# Patient Record
Sex: Male | Born: 1950
Health system: Southern US, Community
[De-identification: ages and names within clinical notes are randomized; demographics above are authoritative.]

## PROBLEM LIST (undated history)

## (undated) DIAGNOSIS — I639 Cerebral infarction, unspecified: Secondary | ICD-10-CM

## (undated) DIAGNOSIS — Z7189 Other specified counseling: Secondary | ICD-10-CM

## (undated) DIAGNOSIS — J449 Chronic obstructive pulmonary disease, unspecified: Secondary | ICD-10-CM

## (undated) DIAGNOSIS — R0600 Dyspnea, unspecified: Secondary | ICD-10-CM

## (undated) DIAGNOSIS — T451X5A Adverse effect of antineoplastic and immunosuppressive drugs, initial encounter: Secondary | ICD-10-CM

## (undated) DIAGNOSIS — C7800 Secondary malignant neoplasm of unspecified lung: Secondary | ICD-10-CM

## (undated) DIAGNOSIS — Z973 Presence of spectacles and contact lenses: Secondary | ICD-10-CM

## (undated) DIAGNOSIS — I6522 Occlusion and stenosis of left carotid artery: Secondary | ICD-10-CM

## (undated) DIAGNOSIS — Z8673 Personal history of transient ischemic attack (TIA), and cerebral infarction without residual deficits: Secondary | ICD-10-CM

## (undated) DIAGNOSIS — I6521 Occlusion and stenosis of right carotid artery: Secondary | ICD-10-CM

## (undated) DIAGNOSIS — K219 Gastro-esophageal reflux disease without esophagitis: Secondary | ICD-10-CM

## (undated) DIAGNOSIS — I714 Abdominal aortic aneurysm, without rupture, unspecified: Secondary | ICD-10-CM

## (undated) DIAGNOSIS — I723 Aneurysm of iliac artery: Secondary | ICD-10-CM

## (undated) DIAGNOSIS — I10 Essential (primary) hypertension: Secondary | ICD-10-CM

## (undated) DIAGNOSIS — G620 Drug-induced polyneuropathy: Secondary | ICD-10-CM

## (undated) DIAGNOSIS — Z8614 Personal history of Methicillin resistant Staphylococcus aureus infection: Secondary | ICD-10-CM

## (undated) DIAGNOSIS — K5730 Diverticulosis of large intestine without perforation or abscess without bleeding: Secondary | ICD-10-CM

## (undated) DIAGNOSIS — K529 Noninfective gastroenteritis and colitis, unspecified: Secondary | ICD-10-CM

## (undated) DIAGNOSIS — K4090 Unilateral inguinal hernia, without obstruction or gangrene, not specified as recurrent: Secondary | ICD-10-CM

## (undated) DIAGNOSIS — J410 Simple chronic bronchitis: Secondary | ICD-10-CM

## (undated) DIAGNOSIS — F329 Major depressive disorder, single episode, unspecified: Secondary | ICD-10-CM

## (undated) DIAGNOSIS — Z5111 Encounter for antineoplastic chemotherapy: Secondary | ICD-10-CM

## (undated) DIAGNOSIS — R0609 Other forms of dyspnea: Secondary | ICD-10-CM

## (undated) DIAGNOSIS — Z9221 Personal history of antineoplastic chemotherapy: Secondary | ICD-10-CM

## (undated) HISTORY — DX: Other specified counseling: Z71.89

## (undated) HISTORY — DX: Secondary malignant neoplasm of unspecified lung: C78.00

## (undated) HISTORY — PX: COLONOSCOPY: SHX174

## (undated) HISTORY — DX: Essential (primary) hypertension: I10

## (undated) HISTORY — DX: Cerebral infarction, unspecified: I63.9

## (undated) HISTORY — DX: Encounter for antineoplastic chemotherapy: Z51.11

## (undated) HISTORY — DX: Major depressive disorder, single episode, unspecified: F32.9

## (undated) HISTORY — PX: BACK SURGERY: SHX140

---

## 2016-12-29 ENCOUNTER — Other Ambulatory Visit (HOSPITAL_COMMUNITY): Payer: Self-pay | Admitting: Internal Medicine

## 2016-12-29 DIAGNOSIS — R9389 Abnormal findings on diagnostic imaging of other specified body structures: Secondary | ICD-10-CM

## 2016-12-31 ENCOUNTER — Encounter (HOSPITAL_COMMUNITY): Admission: RE | Admit: 2016-12-31 | Payer: BLUE CROSS/BLUE SHIELD | Source: Ambulatory Visit

## 2017-01-03 ENCOUNTER — Encounter: Payer: Self-pay | Admitting: Pulmonary Disease

## 2017-01-03 ENCOUNTER — Ambulatory Visit (INDEPENDENT_AMBULATORY_CARE_PROVIDER_SITE_OTHER): Payer: BLUE CROSS/BLUE SHIELD | Admitting: Pulmonary Disease

## 2017-01-03 ENCOUNTER — Encounter (HOSPITAL_COMMUNITY)
Admission: RE | Admit: 2017-01-03 | Discharge: 2017-01-03 | Disposition: A | Discharge status: Home / Self Care | Payer: BLUE CROSS/BLUE SHIELD | Source: Ambulatory Visit | Attending: Internal Medicine | Admitting: Internal Medicine

## 2017-01-03 VITALS — BP 134/68 | HR 83 | Ht 69.0 in | Wt 217.0 lb

## 2017-01-03 DIAGNOSIS — I2510 Atherosclerotic heart disease of native coronary artery without angina pectoris: Secondary | ICD-10-CM | POA: Insufficient documentation

## 2017-01-03 DIAGNOSIS — K4090 Unilateral inguinal hernia, without obstruction or gangrene, not specified as recurrent: Secondary | ICD-10-CM | POA: Diagnosis not present

## 2017-01-03 DIAGNOSIS — I714 Abdominal aortic aneurysm, without rupture: Secondary | ICD-10-CM | POA: Diagnosis not present

## 2017-01-03 DIAGNOSIS — K5730 Diverticulosis of large intestine without perforation or abscess without bleeding: Secondary | ICD-10-CM | POA: Diagnosis not present

## 2017-01-03 DIAGNOSIS — J90 Pleural effusion, not elsewhere classified: Secondary | ICD-10-CM | POA: Insufficient documentation

## 2017-01-03 DIAGNOSIS — R918 Other nonspecific abnormal finding of lung field: Secondary | ICD-10-CM | POA: Diagnosis not present

## 2017-01-03 DIAGNOSIS — I700 Atherosclerosis of aorta: Secondary | ICD-10-CM | POA: Insufficient documentation

## 2017-01-03 DIAGNOSIS — I712 Thoracic aortic aneurysm, without rupture: Secondary | ICD-10-CM | POA: Diagnosis not present

## 2017-01-03 DIAGNOSIS — R911 Solitary pulmonary nodule: Secondary | ICD-10-CM | POA: Diagnosis present

## 2017-01-03 DIAGNOSIS — R9389 Abnormal findings on diagnostic imaging of other specified body structures: Secondary | ICD-10-CM

## 2017-01-03 LAB — GLUCOSE, CAPILLARY: GLUCOSE-CAPILLARY: 110 mg/dL — AB (ref 65–99)

## 2017-01-03 MED ORDER — FLUDEOXYGLUCOSE F - 18 (FDG) INJECTION: 11.1000 | Freq: Once | INTRAVENOUS | Status: DC | PRN

## 2017-01-03 MED ORDER — NICOTINE 21 MG/24HR TD PT24: 21.0000 mg | MEDICATED_PATCH | Freq: Every day | TRANSDERMAL | 1 refills | Status: DC

## 2017-01-03 NOTE — Patient Instructions (Signed)
We will refer you to CT-guided biopsy of the lung We discussed the case in multidisciplinary tumor board  We will prescribe nicotine patch 21 mg/day  Return in 1 month

## 2017-01-03 NOTE — Progress Notes (Addendum)
[name] [name]    [id number]    Oct 19, 1950  Primary Care Physician:No PCP Per Patient  Referring Physician: [name], MD [address] [place], [place] [address]  Chief complaint:  Consult for evaluation of lung nodules.  HPI: [name] is a 66 year old with medical history of hypertension. He was treated earlier this year for pneumonia after an abnormal CXR. He had persistent CXR abnormalities after antibiotics which prompted a CT of the chest earlier this month which showed multiple lung masses suspicious for carcinoma. 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 He denies any cough, sputum production, dyspnea, wheezing. He denies any malaise, fatigue, loss of weight, loss of appetite, hemoptysis.  No outpatient encounter prescriptions on file as of 01/03/2017.   Facility-Administered Encounter Medications as of 01/03/2017  Medication  . fludeoxyglucose F - 18 (FDG) injection 02.7 millicurie    Allergies as of 01/03/2017  . (No Known Allergies)    Past Medical History:  Diagnosis Date  . Hypertension     Past Surgical History:  Procedure Laterality Date  . BACK SURGERY     cyst removal    Family History  Problem Relation Age of Onset  . Cancer Father     unsure of what type    Social History   Social History  . Marital status: Single    Spouse name: N/A  . Number of children: N/A  . Years of education: N/A   Occupational History  . Not on file.   Social History Main Topics  . Smoking status: Current Every Day Smoker    Packs/day: 1.00    Years: 45.00    Types: Cigarettes  . Smokeless tobacco: Never Used  . Alcohol use Not on file  . Drug use: Unknown  . Sexual activity: Not on file   Other Topics Concern  . Not on file   Social History Narrative  . No narrative on file    Review of systems: Review of Systems  Constitutional:  Negative for fever and chills.  HENT: Negative.   Eyes: Negative for blurred vision.  Respiratory: as per HPI  Cardiovascular: Negative for chest pain and palpitations.  Gastrointestinal: Negative for vomiting, diarrhea, blood per rectum. Genitourinary: Negative for dysuria, urgency, frequency and hematuria.  Musculoskeletal: Negative for myalgias, back pain and joint pain.  Skin: Negative for itching and rash.  Neurological: Negative for dizziness, tremors, focal weakness, seizures and loss of consciousness.  Endo/Heme/Allergies: Negative for environmental allergies.  Psychiatric/Behavioral: Negative for depression, suicidal ideas and hallucinations.  All other systems reviewed and are negative.  Physical Exam: Blood pressure 134/68, pulse 83, height '5\' 9"'$  (1.753 m), weight 217 lb (98.4 kg), SpO2 95 %. Gen:      No acute distress HEENT:  EOMI, sclera anicteric Neck:     No masses; no thyromegaly Lungs:    Clear to auscultation bilaterally; normal respiratory effort CV:         Regular rate and rhythm; no murmurs Abd:      + bowel sounds; soft, non-tender; no palpable masses, no distension Ext:    No edema; adequate peripheral perfusion Skin:      Warm and dry; no rash Neuro: alert and oriented x 3 Psych: normal mood and affect  Data Reviewed: CT chest 12/22/16 3 cm right upper lobes spiculated mass, 1.3  cm right upper lobe spiculated nodule, 2 cm left upper lobe spiculated nodule and 6 mm left upper lobe spiculated nodule.  PET scan 01/18/46 Hypermetabolic 3.2 cm right upper lobe nodule. Bilateral upper lobe lung nodules with PET uptake No lymphadenopathy or distant metastatic disease I have reviewed all images personally.  Assessment:  Multiple lung masses and nodules with positive PET uptake highly suspicious for cancer He'll need a biopsy for further diagnosis.  I spent some time with Mr. Creque and his wife discussing options, risk benefit for biopsy with either a navigational  bronchoscope or CT guided biopsy. The location of the nodules make it discuss to access through the regular bronchoscope.   I will make a referral to radiology for further eval and will discuss case at multidisciplinary tumor board to decide on the best approach for biopsy.   Plan/Recommendations: - Refer for CT guided biopsy vs navigational bronchoscope - Tumor board discussion.  At least half of the 40 min visit was spent discussing the images, counseling and discussion of the risk benefits of various procedures for biopsy with the patient and family.   Marshell Garfinkel MD [facility] Pager [phone] 01/03/2017, 3:51 PM  CC: [name], MD

## 2017-01-04 ENCOUNTER — Ambulatory Visit: Payer: Self-pay | Admitting: Pulmonary Disease

## 2017-01-06 ENCOUNTER — Telehealth: Payer: Self-pay | Admitting: Pulmonary Disease

## 2017-01-06 DIAGNOSIS — R918 Other nonspecific abnormal finding of lung field: Secondary | ICD-10-CM

## 2017-01-06 NOTE — Telephone Encounter (Signed)
Order for CT Chest w/o contrast with Super D images ordered - note added that pt will contact us with available dates

## 2017-01-06 NOTE — Telephone Encounter (Signed)
Spoke with pt, states he is returning PM's call from this morning.  PM please call pt back when available.  Thanks!

## 2017-01-06 NOTE — Telephone Encounter (Signed)
Pt wife returning call a/b CT and can be reached @ [phone].Hillery Hunter

## 2017-01-06 NOTE — Telephone Encounter (Signed)
Once patient calls with availability for the CT this phone note will need to be routed to [place] so that the referral to pulmonary can be made for the [place] can schedule (this is per office protocol).

## 2017-01-06 NOTE — Telephone Encounter (Signed)
I have called and spoke with the patient. His case was discussed at the tumor board and with Dr. Lamonte Sakai. There is concern for synchronous primaries. We will try for a navigational bronchoscopy for biopsy of both bilateral lung masses by Dr. Lamonte Sakai. Pt is agreeable to proceed.  He will need a repeat CT scan with super D images for the procedure. 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 He will call us to let us know when he is available.  Plan for CT of chest without contrast with super D images and then schedule for Nav bronchoscopy by Dr. Lamonte Sakai  PM

## 2017-01-06 NOTE — Telephone Encounter (Signed)
Spoke with the pt  He states that he still does not know what availability he has  He will call us tomorrow with this information

## 2017-01-07 NOTE — Telephone Encounter (Signed)
Chest ct '@lhc'$  01/11/17'@2'$ :15 pt aware Joellen Jersey

## 2017-01-07 NOTE — Telephone Encounter (Signed)
Patient returned call, CB is [phone].

## 2017-01-07 NOTE — Telephone Encounter (Signed)
[name] [name] ordered the scans please see previous message from pt about availible date

## 2017-01-07 NOTE — Telephone Encounter (Signed)
We need an order for the ct

## 2017-01-07 NOTE — Telephone Encounter (Signed)
Pt is available 3/27 after 11:00 for a CT.  PCC's please advise.  Thanks!   (routing to [place] as well per previous documentation request)

## 2017-01-10 NOTE — Telephone Encounter (Signed)
Dr byrum I was told to route this to you pt have super d chest ct 3/27 and I have ordered a disk let me know when to schedule the enb thanks libby

## 2017-01-10 NOTE — Telephone Encounter (Signed)
Thanks [place], Dr Vaughan Browner said that he believes the pt could do it on Wednesday 01/19/17

## 2017-01-11 ENCOUNTER — Ambulatory Visit (INDEPENDENT_AMBULATORY_CARE_PROVIDER_SITE_OTHER)
Admission: RE | Admit: 2017-01-11 | Discharge: 2017-01-11 | Disposition: A | Discharge status: Home / Self Care | Payer: BLUE CROSS/BLUE SHIELD | Source: Ambulatory Visit | Attending: Pulmonary Disease | Admitting: Pulmonary Disease

## 2017-01-11 ENCOUNTER — Encounter (HOSPITAL_COMMUNITY)
Admission: RE | Admit: 2017-01-11 | Discharge: 2017-01-11 | Disposition: A | Discharge status: Home / Self Care | Payer: BLUE CROSS/BLUE SHIELD | Source: Ambulatory Visit | Attending: Emergency Medicine | Admitting: Emergency Medicine

## 2017-01-11 ENCOUNTER — Encounter (HOSPITAL_COMMUNITY): Payer: Self-pay | Admitting: *Deleted

## 2017-01-11 DIAGNOSIS — R918 Other nonspecific abnormal finding of lung field: Secondary | ICD-10-CM

## 2017-01-11 DIAGNOSIS — Z01818 Encounter for other preprocedural examination: Secondary | ICD-10-CM | POA: Insufficient documentation

## 2017-01-11 LAB — BASIC METABOLIC PANEL
ANION GAP: 11 (ref 5–15)
BUN: 20 mg/dL (ref 6–20)
CALCIUM: 9.1 mg/dL (ref 8.9–10.3)
CO2: 25 mmol/L (ref 22–32)
CREATININE: 1.07 mg/dL (ref 0.61–1.24)
Chloride: 100 mmol/L — ABNORMAL LOW (ref 101–111)
GFR calc Af Amer: >60 mL/min (ref >60)
GLUCOSE: 88 mg/dL (ref 65–99)
Potassium: 3.9 mmol/L (ref 3.5–5.1)
Sodium: 136 mmol/L (ref 135–145)

## 2017-01-11 LAB — CBC
HCT: 46.4 % (ref 39.0–52.0)
Hemoglobin: 15.7 g/dL (ref 13.0–17.0)
MCH: 29.1 pg (ref 26.0–34.0)
MCHC: 33.8 g/dL (ref 30.0–36.0)
MCV: 86.1 fL (ref 78.0–100.0)
Platelets: 244 10*3/uL (ref 150–400)
RBC: 5.39 MIL/uL (ref 4.22–5.81)
RDW: 13.8 % (ref 11.5–15.5)
WBC: 10.4 10*3/uL (ref 4.0–10.5)

## 2017-01-11 NOTE — Progress Notes (Signed)
PCP is Dr. Duane Boston  Denies ever seeing a cardiologist. Denies ever having a card cath, stress test, or echo. Denies any chest pain.

## 2017-01-11 NOTE — Telephone Encounter (Signed)
enb schedule'@cone'$  4/4/1/'@8'$ :30am [name] [name]

## 2017-01-11 NOTE — Pre-Procedure Instructions (Signed)
[name] [name]  01/11/2017      [facility] [address], [place] [address] [place] [address] Phone: [phone] Fax: [phone]  [facility] [place], [place] - [address] [place] [address] Phone: [phone] Fax: [phone]    Your procedure is scheduled on .April 4  Report to [facility] at 630 A.M.  Call this number if you have problems the morning of surgery:  [phone]   Remember:  Do not eat food or drink liquids after midnight.  Take these medicines the morning of surgery with A SIP OF WATER -NA  Stop taking aspirin, BC's, Goody's, Ibuprofen, Advil, Motrin, Aleve, Vitamins, Herbal medications, Fish Oil   Do not wear jewelry, make-up or nail polish.  Do not wear lotions, powders, or perfumes, or deoderant.  Do not shave 48 hours prior to surgery.  Men may shave face and neck.  Do not bring valuables to the hospital.  [facility] is not responsible for any belongings or valuables.  Contacts, dentures or bridgework may not be worn into surgery.  Leave your suitcase in the car.  After surgery it may be brought to your room.  For patients admitted to the hospital, discharge time will be determined by your treatment team.  Patients discharged the day of surgery will not be allowed to drive home.    Special instructions:  [facility] - Preparing for Surgery  Before surgery, you can play an important role.  Because skin is not sterile, your skin needs to be as free of germs as possible.  You can reduce the number of germs on you skin by washing with CHG (chlorahexidine gluconate) soap before surgery.  CHG is an antiseptic cleaner which kills germs and bonds with the skin to continue killing germs even after washing.  Please DO NOT use if you have an allergy to CHG or antibacterial soaps.  If your skin becomes reddened/irritated stop using the CHG and inform your nurse when you arrive at Short  Stay.  Do not shave (including legs and underarms) for at least 48 hours prior to the first CHG shower.  You may shave your face.  Please follow these instructions carefully:   1.  Shower with CHG Soap the night before surgery and the  morning of Surgery.  2.  If you choose to wash your hair, wash your hair first as usual with your   normal shampoo.  3.  After you shampoo, rinse your hair and body thoroughly to remove the   Shampoo.  4.  Use CHG as you would any other liquid soap.  You can apply chg directly to the skin and wash gently with scrungie or a clean washcloth.  5.  Apply the CHG Soap to your body ONLY FROM THE NECK DOWN.  Do not use on open wounds or open sores.  Avoid contact with your eyes,       ears, mouth and genitals (private parts).  Wash genitals (private parts)  with your normal soap.  6.  Wash thoroughly, paying special attention to the area where your surgery  will be performed.  7.  Thoroughly rinse your body with warm water from the neck down.  8.  DO NOT shower/wash with your normal soap after using and rinsing off  the CHG Soap.  9.  Pat yourself dry with a clean towel.  10.  Wear clean pajamas.            11.  Place clean sheets on your bed the night of your first shower and do not sleep with pets.  Day of Surgery  Do not apply any lotions/deoderants the morning of surgery.  Please wear clean clothes to the hospital/surgery center.     Please read over the following fact sheets that you were given. Pain Booklet, Coughing and Deep Breathing and Surgical Site Infection Prevention

## 2017-01-12 NOTE — Telephone Encounter (Signed)
Spoke to pt is is aware of this appt and admitting has called him and told him to arrive'@6'$ :[place]

## 2017-01-18 NOTE — Anesthesia Preprocedure Evaluation (Addendum)
Anesthesia Evaluation  Patient identified by MRN, date of birth, ID band Patient awake    Reviewed: Allergy & Precautions, NPO status , Patient's Chart, lab work & pertinent test results  Airway Mallampati: I  TM Distance: >3 FB Neck ROM: Full    Dental  (+) Teeth Intact, Poor Dentition   Pulmonary shortness of breath and with exertion, pneumonia, resolved, Current Smoker,  Per report: "Multiple lung masses and nodules with positive PET uptake highly suspicious for cancer"   breath sounds clear to auscultation       Cardiovascular hypertension, Pt. on medications negative cardio ROS   Rhythm:Regular Rate:Normal     Neuro/Psych negative neurological ROS  negative psych ROS   GI/Hepatic negative GI ROS, Neg liver ROS,   Endo/Other  negative endocrine ROS  Renal/GU negative Renal ROS     Musculoskeletal negative musculoskeletal ROS (+)   Abdominal (+) + obese,   Peds  Hematology negative hematology ROS (+)   Anesthesia Other Findings   Reproductive/Obstetrics negative OB ROS                           01/11/17:EKG Normal sinus rhythm Normal ECG No previous tracing  Lab Results  Component Value Date   WBC 10.4 01/11/2017   HGB 15.7 01/11/2017   HCT 46.4 01/11/2017   MCV 86.1 01/11/2017   PLT 244 01/11/2017     Chemistry      Component Value Date/Time   NA 136 01/11/2017 1516   K 3.9 01/11/2017 1516   CL 100 (L) 01/11/2017 1516   CO2 25 01/11/2017 1516   BUN 20 01/11/2017 1516   CREATININE 1.07 01/11/2017 1516      Component Value Date/Time   CALCIUM 9.1 01/11/2017 1516     BP Readings from Last 3 Encounters:  01/11/17 139/79  01/03/17 134/68     Anesthesia Physical Anesthesia Plan  ASA: III  Anesthesia Plan: General   Post-op Pain Management:    Induction: Intravenous  Airway Management Planned: Oral ETT  Additional Equipment:   Intra-op Plan:    Post-operative Plan: Extubation in OR  Informed Consent:   Plan Discussed with:   Anesthesia Plan Comments:        Anesthesia Quick Evaluation

## 2017-01-19 ENCOUNTER — Ambulatory Visit (HOSPITAL_COMMUNITY): Payer: BLUE CROSS/BLUE SHIELD

## 2017-01-19 ENCOUNTER — Encounter (HOSPITAL_COMMUNITY)
Admission: RE | Disposition: A | Discharge status: Home / Self Care | Payer: Self-pay | Source: Ambulatory Visit | Attending: Emergency Medicine

## 2017-01-19 ENCOUNTER — Encounter (HOSPITAL_COMMUNITY): Payer: Self-pay | Admitting: *Deleted

## 2017-01-19 ENCOUNTER — Ambulatory Visit (HOSPITAL_COMMUNITY)
Admission: RE | Admit: 2017-01-19 | Discharge: 2017-01-19 | Disposition: A | Discharge status: Home / Self Care | Payer: BLUE CROSS/BLUE SHIELD | Source: Ambulatory Visit | Attending: Emergency Medicine | Admitting: Emergency Medicine

## 2017-01-19 ENCOUNTER — Ambulatory Visit (HOSPITAL_COMMUNITY): Payer: BLUE CROSS/BLUE SHIELD | Admitting: Anesthesiology

## 2017-01-19 DIAGNOSIS — F17210 Nicotine dependence, cigarettes, uncomplicated: Secondary | ICD-10-CM | POA: Diagnosis not present

## 2017-01-19 DIAGNOSIS — I10 Essential (primary) hypertension: Secondary | ICD-10-CM | POA: Insufficient documentation

## 2017-01-19 DIAGNOSIS — C3411 Malignant neoplasm of upper lobe, right bronchus or lung: Secondary | ICD-10-CM | POA: Diagnosis not present

## 2017-01-19 DIAGNOSIS — R918 Other nonspecific abnormal finding of lung field: Secondary | ICD-10-CM | POA: Diagnosis not present

## 2017-01-19 DIAGNOSIS — Z419 Encounter for procedure for purposes other than remedying health state, unspecified: Secondary | ICD-10-CM

## 2017-01-19 DIAGNOSIS — Z9889 Other specified postprocedural states: Secondary | ICD-10-CM

## 2017-01-19 HISTORY — PX: MINOR PLACEMENT OF FIDUCIAL: SHX6748

## 2017-01-19 HISTORY — PX: VIDEO BRONCHOSCOPY WITH ENDOBRONCHIAL NAVIGATION: SHX6175

## 2017-01-19 HISTORY — DX: Dyspnea, unspecified: R06.00

## 2017-01-19 LAB — COMPREHENSIVE METABOLIC PANEL
ALBUMIN: 3.7 g/dL (ref 3.5–5.0)
ALK PHOS: 49 U/L (ref 38–126)
ALT: 16 U/L — AB (ref 17–63)
AST: 18 U/L (ref 15–41)
Anion gap: 9 (ref 5–15)
BILIRUBIN TOTAL: 0.5 mg/dL (ref 0.3–1.2)
BUN: 18 mg/dL (ref 6–20)
CALCIUM: 9 mg/dL (ref 8.9–10.3)
CO2: 25 mmol/L (ref 22–32)
CREATININE: 1 mg/dL (ref 0.61–1.24)
Chloride: 104 mmol/L (ref 101–111)
GFR calc Af Amer: >60 mL/min (ref >60)
GFR calc non Af Amer: >60 mL/min (ref >60)
Glucose, Bld: 114 mg/dL — ABNORMAL HIGH (ref 65–99)
Potassium: 3.9 mmol/L (ref 3.5–5.1)
SODIUM: 138 mmol/L (ref 135–145)
TOTAL PROTEIN: 7 g/dL (ref 6.5–8.1)

## 2017-01-19 LAB — PROTIME-INR
INR: 0.96
Prothrombin Time: 12.8 seconds (ref 11.4–15.2)

## 2017-01-19 LAB — APTT: aPTT: 33 seconds (ref 24–36)

## 2017-01-19 SURGERY — VIDEO BRONCHOSCOPY WITH ENDOBRONCHIAL NAVIGATION
Anesthesia: General

## 2017-01-19 MED ORDER — PROPOFOL 10 MG/ML IV BOLUS
INTRAVENOUS | Status: AC
  Filled 2017-01-19: qty 40

## 2017-01-19 MED ORDER — 0.9 % SODIUM CHLORIDE (POUR BTL) OPTIME
TOPICAL | Status: DC | PRN
  Administered 2017-01-19: 1000 mL

## 2017-01-19 MED ORDER — PROMETHAZINE HCL 25 MG/ML IJ SOLN: 6.2500 mg | INTRAMUSCULAR | Status: DC | PRN

## 2017-01-19 MED ORDER — PHENYLEPHRINE 40 MCG/ML (10ML) SYRINGE FOR IV PUSH (FOR BLOOD PRESSURE SUPPORT)
PREFILLED_SYRINGE | INTRAVENOUS | Status: DC | PRN
  Administered 2017-01-19: 120 ug via INTRAVENOUS
  Administered 2017-01-19: 80 ug via INTRAVENOUS
  Administered 2017-01-19 (×3): 120 ug via INTRAVENOUS

## 2017-01-19 MED ORDER — LACTATED RINGERS IV SOLN
INTRAVENOUS | Status: DC | PRN
  Administered 2017-01-19 (×2): via INTRAVENOUS

## 2017-01-19 MED ORDER — ROCURONIUM BROMIDE 50 MG/5ML IV SOSY
PREFILLED_SYRINGE | INTRAVENOUS | Status: AC
  Filled 2017-01-19: qty 5

## 2017-01-19 MED ORDER — PROPOFOL 10 MG/ML IV BOLUS
INTRAVENOUS | Status: DC | PRN
  Administered 2017-01-19: 200 mg via INTRAVENOUS

## 2017-01-19 MED ORDER — ONDANSETRON HCL 4 MG/2ML IJ SOLN
INTRAMUSCULAR | Status: AC
  Filled 2017-01-19: qty 2

## 2017-01-19 MED ORDER — MIDAZOLAM HCL 2 MG/2ML IJ SOLN
INTRAMUSCULAR | Status: DC | PRN
  Administered 2017-01-19: 2 mg via INTRAVENOUS

## 2017-01-19 MED ORDER — PHENYLEPHRINE 40 MCG/ML (10ML) SYRINGE FOR IV PUSH (FOR BLOOD PRESSURE SUPPORT)
PREFILLED_SYRINGE | INTRAVENOUS | Status: AC
  Filled 2017-01-19: qty 20

## 2017-01-19 MED ORDER — EPHEDRINE SULFATE-NACL 50-0.9 MG/10ML-% IV SOSY
PREFILLED_SYRINGE | INTRAVENOUS | Status: DC | PRN
  Administered 2017-01-19: 5 mg via INTRAVENOUS

## 2017-01-19 MED ORDER — SCOPOLAMINE 1 MG/3DAYS TD PT72
MEDICATED_PATCH | TRANSDERMAL | Status: AC
  Filled 2017-01-19: qty 2

## 2017-01-19 MED ORDER — ROCURONIUM BROMIDE 10 MG/ML (PF) SYRINGE
PREFILLED_SYRINGE | INTRAVENOUS | Status: DC | PRN
  Administered 2017-01-19: 20 mg via INTRAVENOUS
  Administered 2017-01-19: 50 mg via INTRAVENOUS

## 2017-01-19 MED ORDER — SUGAMMADEX SODIUM 200 MG/2ML IV SOLN
INTRAVENOUS | Status: AC
  Filled 2017-01-19: qty 2

## 2017-01-19 MED ORDER — ONDANSETRON HCL 4 MG/2ML IJ SOLN
INTRAMUSCULAR | Status: DC | PRN
  Administered 2017-01-19: 4 mg via INTRAVENOUS

## 2017-01-19 MED ORDER — FENTANYL CITRATE (PF) 100 MCG/2ML IJ SOLN
INTRAMUSCULAR | Status: DC | PRN
  Administered 2017-01-19: 125 ug via INTRAVENOUS
  Administered 2017-01-19: 25 ug via INTRAVENOUS

## 2017-01-19 MED ORDER — DEXTROSE 5 % IV SOLN
INTRAVENOUS | Status: DC | PRN
  Administered 2017-01-19: 25 ug/min via INTRAVENOUS

## 2017-01-19 MED ORDER — SUGAMMADEX SODIUM 200 MG/2ML IV SOLN
INTRAVENOUS | Status: DC | PRN
  Administered 2017-01-19: 200 mg via INTRAVENOUS

## 2017-01-19 MED ORDER — PHENYLEPHRINE HCL 10 MG/ML IJ SOLN
INTRAMUSCULAR | Status: AC
  Filled 2017-01-19: qty 1

## 2017-01-19 MED ORDER — EPINEPHRINE PF 1 MG/ML IJ SOLN
INTRAMUSCULAR | Status: AC
  Filled 2017-01-19: qty 1

## 2017-01-19 MED ORDER — DEXAMETHASONE SODIUM PHOSPHATE 10 MG/ML IJ SOLN
INTRAMUSCULAR | Status: DC | PRN
  Administered 2017-01-19: 10 mg via INTRAVENOUS

## 2017-01-19 MED ORDER — LIDOCAINE 2% (20 MG/ML) 5 ML SYRINGE
INTRAMUSCULAR | Status: AC
  Filled 2017-01-19: qty 5

## 2017-01-19 MED ORDER — LIDOCAINE 2% (20 MG/ML) 5 ML SYRINGE
INTRAMUSCULAR | Status: DC | PRN
  Administered 2017-01-19: 100 mg via INTRAVENOUS

## 2017-01-19 MED ORDER — ROCURONIUM BROMIDE 50 MG/5ML IV SOSY
PREFILLED_SYRINGE | INTRAVENOUS | Status: AC
Start: 2017-01-19 — End: 2017-01-19
  Filled 2017-01-19: qty 5

## 2017-01-19 MED ORDER — EPHEDRINE 5 MG/ML INJ
INTRAVENOUS | Status: AC
Start: 2017-01-19 — End: 2017-01-19
  Filled 2017-01-19: qty 10

## 2017-01-19 MED ORDER — HYDROMORPHONE HCL 1 MG/ML IJ SOLN: 0.2500 mg | INTRAMUSCULAR | Status: DC | PRN

## 2017-01-19 MED ORDER — MIDAZOLAM HCL 2 MG/2ML IJ SOLN
INTRAMUSCULAR | Status: AC
  Filled 2017-01-19: qty 2

## 2017-01-19 MED ORDER — DEXAMETHASONE SODIUM PHOSPHATE 10 MG/ML IJ SOLN
INTRAMUSCULAR | Status: AC
  Filled 2017-01-19: qty 1

## 2017-01-19 MED ORDER — FENTANYL CITRATE (PF) 250 MCG/5ML IJ SOLN
INTRAMUSCULAR | Status: AC
  Filled 2017-01-19: qty 5

## 2017-01-19 SURGICAL SUPPLY — 39 items
ADAPTER BRONCH F/PENTAX (ADAPTER) ×3 IMPLANT
BRUSH CYTOL CELLEBRITY 1.5X140 (MISCELLANEOUS) ×3 IMPLANT
BRUSH SUPERTRAX BIOPSY (INSTRUMENTS) IMPLANT
BRUSH SUPERTRAX NDL-TIP CYTO (INSTRUMENTS) ×9 IMPLANT
CANISTER SUCT 3000ML PPV (MISCELLANEOUS) ×3 IMPLANT
CHANNEL WORK EXTEND EDGE 180 (KITS) IMPLANT
CHANNEL WORK EXTEND EDGE 45 (KITS) IMPLANT
CHANNEL WORK EXTEND EDGE 90 (KITS) IMPLANT
CONT SPEC 4OZ CLIKSEAL STRL BL (MISCELLANEOUS) ×3 IMPLANT
COVER BACK TABLE 60X90IN (DRAPES) ×3 IMPLANT
FILTER STRAW FLUID ASPIR (MISCELLANEOUS) IMPLANT
FORCEPS BIOP SUPERTRX PREMAR (INSTRUMENTS) ×3 IMPLANT
GAUZE SPONGE 4X4 12PLY STRL (GAUZE/BANDAGES/DRESSINGS) ×3 IMPLANT
GLOVE BIO SURGEON STRL SZ7.5 (GLOVE) ×6 IMPLANT
GOWN STRL REUS W/ TWL LRG LVL3 (GOWN DISPOSABLE) ×2 IMPLANT
GOWN STRL REUS W/TWL LRG LVL3 (GOWN DISPOSABLE) ×4
KIT CLEAN ENDO COMPLIANCE (KITS) ×3 IMPLANT
KIT LOCATABLE GUIDE (CANNULA) IMPLANT
KIT MARKER FIDUCIAL DELIVERY (KITS) ×3 IMPLANT
KIT PROCEDURE EDGE 180 (KITS) ×3 IMPLANT
KIT PROCEDURE EDGE 45 (KITS) IMPLANT
KIT PROCEDURE EDGE 90 (KITS) IMPLANT
KIT ROOM TURNOVER OR (KITS) ×3 IMPLANT
MARKER FIDUCIAL SL NIT COIL (Implant Marker) ×18 IMPLANT
MARKER SKIN DUAL TIP RULER LAB (MISCELLANEOUS) ×3 IMPLANT
NEEDLE SUPERTRX PREMARK BIOPSY (NEEDLE) ×6 IMPLANT
NS IRRIG 1000ML POUR BTL (IV SOLUTION) ×3 IMPLANT
OIL SILICONE PENTAX (PARTS (SERVICE/REPAIRS)) ×3 IMPLANT
PAD ARMBOARD 7.5X6 YLW CONV (MISCELLANEOUS) ×6 IMPLANT
PATCHES PATIENT (LABEL) ×3 IMPLANT
SYR 20CC LL (SYRINGE) ×3 IMPLANT
SYR 20ML ECCENTRIC (SYRINGE) ×3 IMPLANT
SYR 50ML SLIP (SYRINGE) ×3 IMPLANT
TOWEL OR 17X24 6PK STRL BLUE (TOWEL DISPOSABLE) ×3 IMPLANT
TRAP SPECIMEN MUCOUS 40CC (MISCELLANEOUS) IMPLANT
TUBE CONNECTING 20'X1/4 (TUBING) ×1
TUBE CONNECTING 20X1/4 (TUBING) ×2 IMPLANT
UNDERPAD 30X30 (UNDERPADS AND DIAPERS) ×3 IMPLANT
WATER STERILE IRR 1000ML POUR (IV SOLUTION) ×3 IMPLANT

## 2017-01-19 NOTE — Transfer of Care (Signed)
Immediate Anesthesia Transfer of Care Note  Patient: [name] [name]  Procedure(s) Performed: Procedure(s): VIDEO BRONCHOSCOPY WITH ENDOBRONCHIAL NAVIGATION (N/A) MINOR PLACEMENT OF FIDUCIAL x 6 (N/A)  Patient Location: PACU  Anesthesia Type:General  Level of Consciousness: sedated and patient cooperative  Airway & Oxygen Therapy: Patient Spontanous Breathing and Patient connected to face mask oxygen  Post-op Assessment: Report given to RN and Post -op Vital signs reviewed and stable  Post vital signs: Reviewed and stable  Last Vitals:  Vitals:   01/19/17 0628  BP: (!) 158/95  Pulse: 85  Resp: 20  Temp: 36.9 C    Last Pain:  Vitals:   01/11/17 1447  PainSc: 3       Patients Stated Pain Goal: 2 (38/93/73 4287)  Complications: No apparent anesthesia complications

## 2017-01-19 NOTE — Anesthesia Postprocedure Evaluation (Addendum)
Anesthesia Post Note  Patient: [name] [name]  Procedure(s) Performed: Procedure(s) (LRB): VIDEO BRONCHOSCOPY WITH ENDOBRONCHIAL NAVIGATION (N/A) MINOR PLACEMENT OF FIDUCIAL x 6 (N/A)  Patient location during evaluation: PACU Anesthesia Type: General Level of consciousness: awake and alert Pain management: pain level controlled Vital Signs Assessment: post-procedure vital signs reviewed and stable Respiratory status: spontaneous breathing, nonlabored ventilation, respiratory function stable and patient connected to nasal cannula oxygen Cardiovascular status: blood pressure returned to baseline and stable Postop Assessment: no signs of nausea or vomiting Anesthetic complications: no       Last Vitals:  Vitals:   01/19/17 1105 01/19/17 1130  BP:  121/78  Pulse: 82 82  Resp: 17 18  Temp:      Last Pain:  Vitals:   01/19/17 1130  PainSc: 0-No pain                 Omran Keelin,JAMES TERRILL

## 2017-01-19 NOTE — Anesthesia Procedure Notes (Signed)
Procedure Name: Intubation Date/Time: 01/19/2017 8:45 AM Performed by: Mervyn Gay Pre-anesthesia Checklist: Patient identified, Patient being monitored, Timeout performed, Emergency Drugs available and Suction available Patient Re-evaluated:Patient Re-evaluated prior to inductionOxygen Delivery Method: Circle System Utilized Preoxygenation: Pre-oxygenation with 100% oxygen Intubation Type: IV induction Ventilation: Oral airway inserted - appropriate to patient size, Mask ventilation with difficulty and Two handed mask ventilation required Laryngoscope Size: Miller and 3 Grade View: Grade I Tube type: Oral Tube size (mm): 10.0. Number of attempts: 1 Placement Confirmation: ETT inserted through vocal cords under direct vision,  positive ETCO2 and breath sounds checked- equal and bilateral Secured at: 22 cm Tube secured with: Tape Dental Injury: Teeth and Oropharynx as per pre-operative assessment

## 2017-01-19 NOTE — Discharge Instructions (Signed)
Flexible Bronchoscopy, Care After These instructions give you information on caring for yourself after your procedure. Your doctor may also give you more specific instructions. Call your doctor if you have any problems or questions after your procedure. Follow these instructions at home:  Do not eat or drink anything for 2 hours after your procedure. If you try to eat or drink before the medicine wears off, food or drink could go into your lungs. You could also burn yourself.  After 2 hours have passed and when you can cough and gag normally, you may eat soft food and drink liquids slowly.  The day after the test, you may eat your normal diet.  You may do your normal activities.  Keep all doctor visits. Get help right away if:  You get more and more short of breath.  You get light-headed.  You feel like you are going to pass out (faint).  You have chest pain.  You have new problems that worry you.  You cough up more than a little blood.  You cough up more blood than before. This information is not intended to replace advice given to you by your health care provider. Make sure you discuss any questions you have with your health care provider. Document Released: 08/01/2009 Document Revised: 03/11/2016 Document Reviewed: 06/08/2013 Elsevier Interactive Patient Education  2017 [place] CALL OUR OFFICE FOR ANY QUESTIONS OR CONCERNS [phone].

## 2017-01-19 NOTE — Interval H&P Note (Signed)
PCCM Interval Note  66 yo man, hx tobacco, has been evaluated by Dr Vaughan Browner for abnormal CT chest > bilateral pulmonary nodules w spiculation concerning for lung CA. He is referred now for navigational FOB and biopsies to achieve tissue dx. He has every-day cough, denies any CP, dyspnea. 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 All of their questions answered. Risks / benefits discussed, plan to proceed.  Vitals:   01/19/17 0628 01/19/17 0639  BP: (!) 158/95   Pulse: 85   Resp: 20   Temp: 98.4 F (36.9 C)   SpO2: 94%   Weight:  98 kg (216 lb)  Height:  '5\' 9"'$  (1.753 m)   Gen: Pleasant, well-nourished, overwt, in no distress,  normal affect  ENT: No lesions,  mouth clear, M1 airway  Neck: No JVD, no stridor  Lungs: No use of accessory muscles, no wheeze or crackles  Cardiovascular: RRR, heart sounds normal, no peripheral edema  Abdomen: soft and benign  Musculoskeletal: No deformities, no cyanosis or clubbing  Neuro: alert, non focal  Skin: Warm, no lesions or rashes  Impression:  B pulmonary nodules in with hx tobacco use. Plan for ENB and bx's today. Pt agrees w the plan.   Baltazar Apo, MD, PhD 01/19/2017, 8:35 AM [facility] [phone] or if no answer [phone]

## 2017-01-19 NOTE — Op Note (Signed)
Video Bronchoscopy with Electromagnetic Navigation Procedure Note  Date of Operation: 01/19/2017  Pre-op Diagnosis: bilateral pulmonary nodules  Post-op Diagnosis: same  Surgeon: Baltazar Apo  Assistants: none  Anesthesia: General endotracheal anesthesia  Operation: Flexible video fiberoptic bronchoscopy with electromagnetic navigation and biopsies.  Estimated Blood Loss: Minimal  Complications: none apparent  Indications and History: [name] [name] is a 66 y.o. male with hx tobacco use, found to have bilateral pulmonary nodules. Recommendation was to pursue bilateral nodule biopsies via navigational bronchoscopy.  The risks, benefits, complications, treatment options and expected outcomes were discussed with the patient.  The possibilities of pneumothorax, pneumonia, reaction to medication, pulmonary aspiration, perforation of a viscus, bleeding, failure to diagnose a condition and creating a complication requiring transfusion or operation were discussed with the patient who freely signed the consent.    Description of Procedure: The patient was seen in the Preoperative Area, was examined and was deemed appropriate to proceed.  The patient was taken to OR 12, identified as [name] [name] and the procedure verified as Flexible Video Fiberoptic Bronchoscopy.  A Time Out was held and the above information confirmed.   Prior to the date of the procedure a high-resolution CT scan of the chest was performed. Utilizing [place] View a virtual tracheobronchial tree was generated to allow the creation of distinct navigation pathways to the patient's parenchymal abnormalities. After being taken to the operating room general anesthesia was initiated and the patient  was orally intubated. The video fiberoptic bronchoscope was introduced via the endotracheal tube and a general inspection was performed which showed normal airways throughout. There was some gray mucous that was easily suctioned away.  The extendable working channel and locator guide were introduced into the bronchoscope. The distinct navigation pathways prepared prior to this procedure were then utilized to navigate to within 0.5-1.4 cm of patient's lesion(s) identified on CT scan. The targets were identified as follows:   Larger right upper lobe lesion = target 1 Larger left upper lobe lesion = target 2 Smaller right upper lobe lesion = target 3 Smaller left upper lobe lesion = target 4  The extendable working channel was secured into place at each location and the locator guide was withdrawn. Under fluoroscopic guidance transbronchial needle brushings, transbronchial Wang needle biopsies, and transbronchial forceps biopsies were performed to be sent for cytology and pathology from targets 1 and 2. Transbronchial needle brushings were performed at target 3. Fiducial markers were placed to mark and triangulate targets 3 and 4 (the smaller lesions). A bronchioalveolar lavage was performed in the LUL adjacent to target 3 and sent for cytology. At the end of the procedure a general airway inspection was performed and there was no evidence of active bleeding. The bronchoscope was removed.  The patient tolerated the procedure well. There was no significant blood loss and there were no obvious complications. A post-procedural chest x-ray is pending.  Samples: 1. Transbronchial needle brushings from target 1, target 2, target 3 2. Transbronchial Wang needle biopsies from target 1, target 2 3. Transbronchial forceps biopsies from 1, target 2 4. Bronchoalveolar lavage from left upper lobe adjacent to target 2  Plans:  The patient will be discharged from the PACU to home when recovered from anesthesia and after chest x-ray is reviewed. We will review the cytology, pathology results with the patient when they become available. Outpatient followup will be with Dr Vaughan Browner or Dr Lamonte Sakai.    Baltazar Apo, MD, PhD 01/19/2017, 10:26 AM [facility] [phone] or if no answer [phone]

## 2017-01-19 NOTE — H&P (View-Only) (Signed)
CLAUDIA ALVIZO    [id number]    1951/05/10  Primary Care Physician:No PCP Per Patient  Referring Physician: [name], MD [address] [place], [place] [address]  Chief complaint:  Consult for evaluation of lung nodules.  HPI: Mr. Kaley is a 66 year old with medical history of hypertension. He was treated earlier this year for pneumonia after an abnormal CXR. He had persistent CXR abnormalities after antibiotics which prompted a CT of the chest earlier this month which showed multiple lung masses suspicious for carcinoma. 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 He denies any cough, sputum production, dyspnea, wheezing. He denies any malaise, fatigue, loss of weight, loss of appetite, hemoptysis.  No outpatient encounter prescriptions on file as of 01/03/2017.   Facility-Administered Encounter Medications as of 01/03/2017  Medication  . fludeoxyglucose F - 18 (FDG) injection 63.0 millicurie    Allergies as of 01/03/2017  . (No Known Allergies)    Past Medical History:  Diagnosis Date  . Hypertension     Past Surgical History:  Procedure Laterality Date  . BACK SURGERY     cyst removal    Family History  Problem Relation Age of Onset  . Cancer Father     unsure of what type    Social History   Social History  . Marital status: Single    Spouse name: N/A  . Number of children: N/A  . Years of education: N/A   Occupational History  . Not on file.   Social History Main Topics  . Smoking status: Current Every Day Smoker    Packs/day: 1.00    Years: 45.00    Types: Cigarettes  . Smokeless tobacco: Never Used  . Alcohol use Not on file  . Drug use: Unknown  . Sexual activity: Not on file   Other Topics Concern  . Not on file   Social History Narrative  . No narrative on file    Review of systems: Review of Systems  Constitutional:  Negative for fever and chills.  HENT: Negative.   Eyes: Negative for blurred vision.  Respiratory: as per HPI  Cardiovascular: Negative for chest pain and palpitations.  Gastrointestinal: Negative for vomiting, diarrhea, blood per rectum. Genitourinary: Negative for dysuria, urgency, frequency and hematuria.  Musculoskeletal: Negative for myalgias, back pain and joint pain.  Skin: Negative for itching and rash.  Neurological: Negative for dizziness, tremors, focal weakness, seizures and loss of consciousness.  Endo/Heme/Allergies: Negative for environmental allergies.  Psychiatric/Behavioral: Negative for depression, suicidal ideas and hallucinations.  All other systems reviewed and are negative.  Physical Exam: Blood pressure 134/68, pulse 83, height '5\' 9"'$  (1.753 m), weight 217 lb (98.4 kg), SpO2 95 %. Gen:      No acute distress HEENT:  EOMI, sclera anicteric Neck:     No masses; no thyromegaly Lungs:    Clear to auscultation bilaterally; normal respiratory effort CV:         Regular rate and rhythm; no murmurs Abd:      + bowel sounds; soft, non-tender; no palpable masses, no distension Ext:    No edema; adequate peripheral perfusion Skin:      Warm and dry; no rash Neuro: alert and oriented x 3 Psych: normal mood and affect  Data Reviewed: CT chest 12/22/16 3 cm right upper lobes spiculated mass, 1.3  cm right upper lobe spiculated nodule, 2 cm left upper lobe spiculated nodule and 6 mm left upper lobe spiculated nodule.  PET scan 06/06/80 Hypermetabolic 3.2 cm right upper lobe nodule. Bilateral upper lobe lung nodules with PET uptake No lymphadenopathy or distant metastatic disease I have reviewed all images personally.  Assessment:  Multiple lung masses and nodules with positive PET uptake highly suspicious for cancer He'll need a biopsy for further diagnosis.  I spent some time with Mr. Stockhausen and his wife discussing options, risk benefit for biopsy with either a navigational  bronchoscope or CT guided biopsy. The location of the nodules make it discuss to access through the regular bronchoscope.   I will make a referral to radiology for further eval and will discuss case at multidisciplinary tumor board to decide on the best approach for biopsy.   Plan/Recommendations: - Refer for CT guided biopsy vs navigational bronchoscope - Tumor board discussion.  At least half of the 40 min visit was spent discussing the images, counseling and discussion of the risk benefits of various procedures for biopsy with the patient and family.   Marshell Garfinkel MD [facility] Pager [phone] 01/03/2017, 3:51 PM  CC: [name], MD

## 2017-01-20 ENCOUNTER — Telehealth: Payer: Self-pay | Admitting: Pulmonary Disease

## 2017-01-20 ENCOUNTER — Encounter (HOSPITAL_COMMUNITY): Payer: Self-pay | Admitting: Emergency Medicine

## 2017-01-20 DIAGNOSIS — C3490 Malignant neoplasm of unspecified part of unspecified bronchus or lung: Secondary | ICD-10-CM

## 2017-01-20 NOTE — Telephone Encounter (Signed)
Lung biopsy pathology shows squamous cell cancer. I have called and informed [name] [name] about the results. We will refer to oncology.

## 2017-01-21 NOTE — Telephone Encounter (Signed)
Referral has been placed.  Pt has been made aware per PM. Nothing further needed.

## 2017-01-24 ENCOUNTER — Telehealth: Payer: Self-pay | Admitting: *Deleted

## 2017-01-24 ENCOUNTER — Encounter: Payer: Self-pay | Admitting: *Deleted

## 2017-01-24 DIAGNOSIS — R918 Other nonspecific abnormal finding of lung field: Secondary | ICD-10-CM

## 2017-01-24 NOTE — Telephone Encounter (Signed)
Oncology Nurse Navigator Documentation  Oncology Nurse Navigator Flowsheets 01/24/2017  Navigator Location [facility]  Referral date to RadOnc/MedOnc 01/24/2017  Navigator Encounter Type Telephone/I received referral today on Mr. Klingbeil.  I called and gave him an appt to be seen on 01/28/17.  He verbalized understanding of appt time and place.   Telephone Outgoing Call  Treatment Phase Pre-Tx/Tx Discussion  Barriers/Navigation Needs Coordination of Care  Interventions Coordination of Care  Coordination of Care Appts  Acuity Level 2  Acuity Level 2 Assistance expediting appointments  Time Spent with Patient 30

## 2017-01-26 ENCOUNTER — Telehealth: Payer: Self-pay | Admitting: *Deleted

## 2017-01-26 ENCOUNTER — Other Ambulatory Visit: Payer: BLUE CROSS/BLUE SHIELD

## 2017-01-26 NOTE — Telephone Encounter (Signed)
Oncology Nurse Navigator Documentation  Oncology Nurse Navigator Flowsheets 01/26/2017  Navigator Location [facility]  Navigator Encounter Type Telephone/I called [name] [name] today.  Dr. Julien Nordmann had a cancellation so I called patient to schedule him an hour earlier on 01/28/17.  He was able to make the new appt time.    Telephone Outgoing Call  Treatment Phase Pre-Tx/Tx Discussion  Barriers/Navigation Needs Coordination of Care  Interventions Coordination of Care  Coordination of Care Appts  Acuity Level 2  Time Spent with Patient 15

## 2017-01-27 ENCOUNTER — Other Ambulatory Visit (HOSPITAL_COMMUNITY)
Admission: RE | Admit: 2017-01-27 | Payer: BLUE CROSS/BLUE SHIELD | Source: Ambulatory Visit | Admitting: Internal Medicine

## 2017-01-28 ENCOUNTER — Other Ambulatory Visit (HOSPITAL_BASED_OUTPATIENT_CLINIC_OR_DEPARTMENT_OTHER): Payer: BLUE CROSS/BLUE SHIELD

## 2017-01-28 ENCOUNTER — Encounter: Payer: Self-pay | Admitting: Internal Medicine

## 2017-01-28 ENCOUNTER — Ambulatory Visit (HOSPITAL_BASED_OUTPATIENT_CLINIC_OR_DEPARTMENT_OTHER): Payer: BLUE CROSS/BLUE SHIELD | Admitting: Internal Medicine

## 2017-01-28 ENCOUNTER — Telehealth: Payer: Self-pay | Admitting: Internal Medicine

## 2017-01-28 ENCOUNTER — Ambulatory Visit: Payer: BLUE CROSS/BLUE SHIELD | Admitting: Internal Medicine

## 2017-01-28 ENCOUNTER — Other Ambulatory Visit: Payer: BLUE CROSS/BLUE SHIELD

## 2017-01-28 DIAGNOSIS — R918 Other nonspecific abnormal finding of lung field: Secondary | ICD-10-CM

## 2017-01-28 DIAGNOSIS — C3412 Malignant neoplasm of upper lobe, left bronchus or lung: Secondary | ICD-10-CM

## 2017-01-28 DIAGNOSIS — Z716 Tobacco abuse counseling: Secondary | ICD-10-CM

## 2017-01-28 DIAGNOSIS — I10 Essential (primary) hypertension: Secondary | ICD-10-CM

## 2017-01-28 DIAGNOSIS — C3411 Malignant neoplasm of upper lobe, right bronchus or lung: Secondary | ICD-10-CM | POA: Diagnosis not present

## 2017-01-28 DIAGNOSIS — Z720 Tobacco use: Secondary | ICD-10-CM

## 2017-01-28 DIAGNOSIS — Z5111 Encounter for antineoplastic chemotherapy: Secondary | ICD-10-CM

## 2017-01-28 DIAGNOSIS — F329 Major depressive disorder, single episode, unspecified: Secondary | ICD-10-CM | POA: Diagnosis not present

## 2017-01-28 DIAGNOSIS — Z7189 Other specified counseling: Secondary | ICD-10-CM

## 2017-01-28 DIAGNOSIS — C7800 Secondary malignant neoplasm of unspecified lung: Secondary | ICD-10-CM

## 2017-01-28 HISTORY — DX: Secondary malignant neoplasm of unspecified lung: C78.00

## 2017-01-28 LAB — COMPREHENSIVE METABOLIC PANEL
ALK PHOS: 63 U/L (ref 40–150)
ALT: 12 U/L (ref 0–55)
ANION GAP: 7 meq/L (ref 3–11)
AST: 15 U/L (ref 5–34)
Albumin: 4.1 g/dL (ref 3.5–5.0)
BUN: 18.6 mg/dL (ref 7.0–26.0)
CALCIUM: 9.7 mg/dL (ref 8.4–10.4)
CHLORIDE: 103 meq/L (ref 98–109)
CO2: 26 mEq/L (ref 22–29)
Creatinine: 1.1 mg/dL (ref 0.7–1.3)
EGFR: 71 mL/min/{1.73_m2} — AB (ref >90)
GLUCOSE: 111 mg/dL (ref 70–140)
POTASSIUM: 4.1 meq/L (ref 3.5–5.1)
Sodium: 136 mEq/L (ref 136–145)
Total Bilirubin: 0.58 mg/dL (ref 0.20–1.20)
Total Protein: 7.7 g/dL (ref 6.4–8.3)

## 2017-01-28 LAB — CBC WITH DIFFERENTIAL/PLATELET
BASO%: 0.9 % (ref 0.0–2.0)
BASOS ABS: 0.1 10*3/uL (ref 0.0–0.1)
EOS%: 1.3 % (ref 0.0–7.0)
Eosinophils Absolute: 0.1 10*3/uL (ref 0.0–0.5)
HEMATOCRIT: 49.1 % (ref 38.4–49.9)
HGB: 16.7 g/dL (ref 13.0–17.1)
LYMPH#: 4.4 10*3/uL — AB (ref 0.9–3.3)
LYMPH%: 39.8 % (ref 14.0–49.0)
MCH: 28.8 pg (ref 27.2–33.4)
MCHC: 34 g/dL (ref 32.0–36.0)
MCV: 84.9 fL (ref 79.3–98.0)
MONO#: 0.8 10*3/uL (ref 0.1–0.9)
MONO%: 7.6 % (ref 0.0–14.0)
NEUT#: 5.6 10*3/uL (ref 1.5–6.5)
NEUT%: 50.4 % (ref 39.0–75.0)
PLATELETS: 274 10*3/uL (ref 140–400)
RBC: 5.78 10*6/uL (ref 4.20–5.82)
RDW: 14 % (ref 11.0–14.6)
WBC: 11 10*3/uL — ABNORMAL HIGH (ref 4.0–10.3)

## 2017-01-28 MED ORDER — MIRTAZAPINE 30 MG PO TABS: 30.0000 mg | ORAL_TABLET | Freq: Every day | ORAL | 1 refills | Status: DC

## 2017-01-28 MED ORDER — PROCHLORPERAZINE MALEATE 10 MG PO TABS: 10.0000 mg | ORAL_TABLET | Freq: Four times a day (QID) | ORAL | 0 refills | Status: DC | PRN

## 2017-01-28 NOTE — Telephone Encounter (Signed)
Chemo class scheduled for 02/03/17  Weekly labs scheduled with Chemo. Chemo therapy scheduled every 3 weeks with Labs and follow up visit, per 01/28/17 los. Patient scheduled throughout 03/22/17. Patient was given a copy of the AVS report and appointment schedule per 01/28/17 los.

## 2017-01-28 NOTE — Progress Notes (Signed)
START ON PATHWAY REGIMEN - Non-Small Cell Lung     A cycle is every 21 days:     Paclitaxel      Carboplatin   **Always confirm dose/schedule in your pharmacy ordering system**    Patient Characteristics: Stage IV Metastatic, Squamous, PS = 0, 1, First Line, PD-L1 Expression Positive 1-49% (TPS) / Negative / Not Tested / Not a Candidate for Immunotherapy AJCC T Category: T3 Current Disease Status: Distant Metastases AJCC N Category: N0 AJCC M Category: M1a AJCC 8 Stage Grouping: IVA Histology: Squamous Cell Line of therapy: First Line PD-L1 Expression Status: Test Not Ordered Performance Status: PS = 0, 1 Would you be surprised if this patient died  in the next year? I would NOT be surprised if this patient died in the next year  Intent of Therapy: Non-Curative / Palliative Intent, Discussed with Patient

## 2017-01-28 NOTE — Progress Notes (Signed)
[facility] Telephone:([phone]) [phone]   Fax:(336) [phone]  CONSULT NOTE  REFERRING PHYSICIAN: Dr. Baltazar Apo.  REASON FOR CONSULTATION:  66 years old white male recently diagnosed with lung cancer.  HPI [name] [name] is a 66 y.o. male with past medical history significant for hypertension, back pain status post back surgery and long history of smoking. The patient mentions that 2 months ago he was complaining of chest pain. He was seen by her primary care physician in [place]. chest x-ray at that time showed suspicious nodules in the lung and the patient was treated for pneumonia with a course of antibiotics with no improvement. Repeat chest x-ray showed persistence of the pulmonary nodules. The patient had CT scan of the chest performed in [place], [place], the scan showed bilateral pulmonary nodules. The patient was referred to Dr. Lamonte Sakai and he had a PET scan performed on 01/03/2017 and it showed irregular hypermetabolic 3.2 x 2.2 cm central right upper lobe lung mass with maximum SUV of 12.4. There was also irregular 1.4 x 0.9 cm posterior superior right upper lobe pulmonary nodule was borderline hypermetabolism with maximum SUV of 3.3. There was also spiculated hypermetabolic 2.0 x 1.6 cm left upper lobe pulmonary nodule with SUV max of 16.8 as well as irregular hypermetabolic 1.0 x 0.8 cm peripheral left upper lobe pulmonary nodule with SUV max of 5.4. Subcentimeter left lower lobe pulmonary nodule was below the PET resolution but metastasis was not excluded. There was no hypermetabolic thoracic adenopathy or distant metastatic disease. On 01/19/2017 the patient underwent bronchoscopy with electromagnetic navigational bronchoscopy and biopsy of the pulmonary nodules. The final pathology [id number]) showed the squamous cell carcinoma of the biopsied nodules from the lungs bilaterally. Dr. Lamonte Sakai kindly referred the patient to me today for evaluation and recommendation regarding  treatment of his condition. When seen today the patient is feeling fine with no significant chest pain but has shortness breath with exertion and cough productive of yellowish sputum with no hemoptysis. He lost around 25 pounds in the last 6 months. He has no nausea, vomiting, diarrhea or constipation. 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 Family history significant for mother with history of cancer and father had peripheral vascular disease. The patient is single and has no children. He was accompanied by his girlfriend [name] [name]. He works as a Administrator. He has a history of smoking 1 pack per day for around 50 years and unfortunately he continues to smoke. He has no history of alcohol or drug abuse.  HPI  Past Medical History:  Diagnosis Date  . Dyspnea   . Hypertension     Past Surgical History:  Procedure Laterality Date  . BACK SURGERY     cyst removal  . COLONOSCOPY    . MINOR PLACEMENT OF FIDUCIAL N/A 01/19/2017   Procedure: MINOR PLACEMENT OF FIDUCIAL x 6;  Surgeon: Collene Gobble, MD;  Location: [facility];  Service: Thoracic;  Laterality: N/A;  . VIDEO BRONCHOSCOPY WITH ENDOBRONCHIAL NAVIGATION N/A 01/19/2017   Procedure: VIDEO BRONCHOSCOPY WITH ENDOBRONCHIAL NAVIGATION;  Surgeon: Collene Gobble, MD;  Location: [facility] OR;  Service: Thoracic;  Laterality: N/A;    Family History  Problem Relation Age of Onset  . Cancer Father     unsure of what type    Social History Social History  Substance Use Topics  . Smoking  status: Current Every Day Smoker    Packs/day: 1.00    Years: 45.00    Types: Cigarettes  . Smokeless tobacco: Never Used  . Alcohol use No    Allergies  Allergen Reactions  . No Known Allergies     Current Outpatient Prescriptions  Medication Sig Dispense Refill  . acetaminophen (TYLENOL) 325 MG tablet Take 325-650 mg by mouth every 6 (six) hours as  needed (for aches/pains).    Marland Kitchen aspirin 81 MG chewable tablet Chew 81 mg by mouth daily.    . hydrochlorothiazide (HYDRODIURIL) 25 MG tablet Take 25 mg by mouth daily.    Marland Kitchen ibuprofen (ADVIL,MOTRIN) 200 MG tablet Take 200-400 mg by mouth every 6 (six) hours as needed (for aches/pains).    Marland Kitchen lisinopril (PRINIVIL,ZESTRIL) 40 MG tablet Take 40 mg by mouth daily.    . naproxen sodium (ANAPROX) 220 MG tablet Take 220-440 mg by mouth 2 (two) times daily as needed (for aches/pains).    . nicotine (NICODERM CQ - DOSED IN MG/24 HOURS) 21 mg/24hr patch Place 1 patch (21 mg total) onto the skin daily. 28 patch 1   No current facility-administered medications for this visit.     Review of Systems  Constitutional: positive for weight loss Eyes: negative Ears, nose, mouth, throat, and face: negative Respiratory: positive for cough, dyspnea on exertion and sputum Cardiovascular: negative Gastrointestinal: negative Genitourinary:negative Integument/breast: negative Hematologic/lymphatic: negative Musculoskeletal:negative Neurological: negative Behavioral/Psych: negative Endocrine: negative Allergic/Immunologic: negative  Physical Exam  BJS:EGBTD, healthy, no distress, well nourished, well developed and anxious SKIN: skin color, texture, turgor are normal, no rashes or significant lesions HEAD: Normocephalic, No masses, lesions, tenderness or abnormalities EYES: normal, PERRLA, Conjunctiva are pink and non-injected EARS: External ears normal, Canals clear OROPHARYNX:no exudate, no erythema and lips, buccal mucosa, and tongue normal  NECK: supple, no adenopathy, no JVD LYMPH:  no palpable lymphadenopathy, no hepatosplenomegaly LUNGS: clear to auscultation , and palpation HEART: regular rate & rhythm, no murmurs and no gallops ABDOMEN:abdomen soft, non-tender, normal bowel sounds and no masses or organomegaly BACK: No CVA tenderness, Range of motion is normal EXTREMITIES:no joint deformities,  effusion, or inflammation, no edema, no skin discoloration  NEURO: alert & oriented x 3 with fluent speech, no focal motor/sensory deficits  PERFORMANCE STATUS: ECOG 1  LABORATORY DATA: Lab Results  Component Value Date   WBC 11.0 (H) 01/28/2017   HGB 16.7 01/28/2017   HCT 49.1 01/28/2017   MCV 84.9 01/28/2017   PLT 274 01/28/2017      Chemistry      Component Value Date/Time   NA 138 01/19/2017 0633   K 3.9 01/19/2017 0633   CL 104 01/19/2017 0633   CO2 25 01/19/2017 0633   BUN 18 01/19/2017 0633   CREATININE 1.00 01/19/2017 0633      Component Value Date/Time   CALCIUM 9.0 01/19/2017 0633   ALKPHOS 49 01/19/2017 0633   AST 18 01/19/2017 0633   ALT 16 (L) 01/19/2017 0633   BILITOT 0.5 01/19/2017 1761       RADIOGRAPHIC STUDIES: Nm Pet Image Initial (pi) Skull Base To Thigh  Result Date: 01/03/2017 CLINICAL DATA:  Initial treatment strategy for reported bilateral pulmonary nodules on the outside chest CT, including a dominant reported 3 cm anterior right upper lobe spiculated lung mass. EXAM: NUCLEAR MEDICINE PET SKULL BASE TO THIGH TECHNIQUE: 11.1 mCi F-18 FDG was injected intravenously. Full-ring PET imaging was performed from the skull base to thigh after the radiotracer. CT data was  obtained and used for attenuation correction and anatomic localization. FASTING BLOOD GLUCOSE:  Value: 110 mg/dl COMPARISON:  12/02/2016 chest radiograph. Report from outside chest CT performed 12/22/2016 (images not available). FINDINGS: NECK No hypermetabolic lymph nodes in the neck. CHEST Right coronary atherosclerosis. Atherosclerotic thoracic aorta with 4.3 cm ascending thoracic aortic aneurysm. No pathologically enlarged or hypermetabolic axillary, mediastinal or hilar lymph nodes. No pneumothorax. No pleural effusion. Mild centrilobular and paraseptal emphysema with mild diffuse bronchial wall thickening. Irregular hypermetabolic 3.2 x 2.2 cm central right upper lobe lung mass with max SUV  12.4 (series 8/image 37). Irregular 1.4 x 0.9 cm posterior superior right upper lobe pulmonary nodule with borderline hypermetabolism with max SUV 3.3 (series 8/image 24). Spiculated hypermetabolic 2.0 x 1.6 cm left upper lobe pulmonary nodule with max SUV 13.8 (series 8/image 37). Irregular hypermetabolic 1.0 x 0.8 cm peripheral left upper lobe pulmonary nodule with max SUV 5.4 (series 8/image 32). Subpleural peripheral basilar left lower lobe 4 mm solid pulmonary nodule (series 8/image 52), below PET resolution, not associated with significant metabolism. No acute consolidative airspace disease. ABDOMEN/PELVIS No abnormal hypermetabolic activity within the liver, pancreas, or spleen. No hypermetabolic lymph nodes in the abdomen or pelvis. Mildly diffusely thickened adrenal glands, left greater than right, without discrete adrenal nodules or adrenal hypermetabolism, most suggestive of mild adrenal hyperplasia. Atherosclerotic abdominal aorta with infrarenal 3.2 cm abdominal aortic aneurysm. Small fat containing right inguinal hernia. Moderate sigmoid diverticulosis. SKELETON No focal hypermetabolic activity to suggest skeletal metastasis. IMPRESSION: 1. Dominant irregular hypermetabolic 3.2 cm central right upper lobe lung mass, compatible with primary bronchogenic carcinoma. 2. Three additional irregular hypermetabolic pulmonary nodules in the upper lobes bilaterally as described, which could represent synchronous primary bronchogenic carcinomas and/or pulmonary metastases. Subcentimeter left lower lobe pulmonary nodule, below PET resolution, metastasis not excluded. 3. Multidisciplinary thoracic oncology consultation advised. 4. No hypermetabolic thoracic adenopathy or distant metastatic disease. 5. Aortic atherosclerosis. Ascending thoracic aortic 4.3 cm aneurysm. Recommend annual imaging followup by CTA or MRA. This recommendation follows 2010 ACCF/AHA/AATS/ACR/ASA/SCA/SCAI/SIR/STS/SVM Guidelines for the  Diagnosis and Management of Patients with Thoracic Aortic Disease. Circulation. 2010; 121: N629-B284. 6. Infrarenal 3.2 cm abdominal aortic aneurysm. Recommend followup by ultrasound in 3 years. This recommendation follows ACR consensus guidelines: White Paper of the ACR Incidental Findings Committee II on Vascular Findings. J Am Coll Radiol 2013; 10:789-794. 7. Additional findings include 1 vessel coronary atherosclerosis, mild emphysema with mild diffuse bronchial wall thickening suggesting COPD, small fat containing right inguinal hernia and moderate sigmoid diverticulosis . Electronically Signed   By: Ilona Sorrel M.D.   On: 01/03/2017 13:16   Dg Chest Port 1 View  Result Date: 01/19/2017 CLINICAL DATA:  Postop bronchoscopy. EXAM: PORTABLE CHEST 1 VIEW COMPARISON:  None. FINDINGS: The heart size and mediastinal contours are within normal limits. Atherosclerosis of thoracic aorta is noted. No pneumothorax or pleural effusion is noted. Elevated left hemidiaphragm is noted. Both lungs are clear. The visualized skeletal structures are unremarkable. IMPRESSION: Aortic atherosclerosis. Elevated left hemidiaphragm. No pneumothorax seen. Electronically Signed   By: Marijo Conception, M.D.   On: 01/19/2017 11:04   Ct Super D Chest Wo Contrast  Result Date: 01/11/2017 CLINICAL DATA:  Bilateral pulmonary nodules. Electro magnetic bronchoscopy planning. EXAM: CT CHEST WITHOUT CONTRAST TECHNIQUE: Multidetector CT imaging of the chest was performed using thin slice collimation for electromagnetic bronchoscopy planning purposes, without intravenous contrast. COMPARISON:  PET-CT on 01/03/2017 FINDINGS: Cardiovascular: No acute findings. Aortic and coronary artery atherosclerosis. Stable 4.4 cm ascending  thoracic aortic aneurysm. Mediastinum/Nodes: No masses or pathologically enlarged lymph nodes identified on this unenhanced exam. Lungs/Pleura: Spiculated mass is again seen in the right upper lobe measuring 3.1 cm on image  77/3. Spiculated nodule in the posterior right upper lobe measures 1.3 cm on image 47/3. Spiculated nodule in the anterior left upper lobe measures 2.0 cm on image 73/3. Spiculated nodule in the left upper lobe measures 9 mm on image 64/3. 3 mm subpleural pulmonary nodule again seen in the left lower lobe on image 108/3. All these pulmonary nodules and masses are stable compared to previous study. No evidence of pleural effusion. Mild emphysema noted. Upper Abdomen: Stable diffuse thickening and low-attenuation of both thyroid glands, consistent with adrenal hyperplasia. Musculoskeletal:  No suspicious bone lesions. IMPRESSION: Bilateral upper lobe spiculated pulmonary nodules, including dominant 3.1 cm mass in the right upper lobe, highly suspicious for synchronous bronchogenic carcinomas and/or metastases. 3 mm subpleural left lower lobe pulmonary nodule is stable and too small to characterize. No evidence of lymphadenopathy or pleural effusion. Stable 4.4 cm ascending thoracic aortic aneurysm. Recommend annual imaging followup by CTA or MRA. This recommendation follows 2010 ACCF/AHA/AATS/ACR/ASA/SCA/SCAI/SIR/STS/SVM Guidelines for the Diagnosis and Management of Patients with Thoracic Aortic Disease. Circulation. 2010; 121: W431-V400 Electronically Signed   By: Earle Gell M.D.   On: 01/11/2017 14:59   Dg C-arm Bronchoscopy  Result Date: 01/19/2017 C-ARM BRONCHOSCOPY: Fluoroscopy was utilized by the requesting physician.  No radiographic interpretation.    ASSESSMENT: This is a very pleasant 66 years old white male recently diagnosed with stage IV (T3, N0, M1 a) non-small cell lung cancer, squamous cell carcinoma presented with multiple bilateral pulmonary nodules diagnosed in April 2018.  PLAN: I had a lengthy discussion with the patient and his girlfriend about his current disease stage, prognosis and treatment options. 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 I strongly recommended for the patient not to drive at this point until you rule out any metastatic disease to the brain. Unfortunately there is insufficient material for PDL 1 testing. I explained to the patient that he has incurable condition and all the treatment will be of palliative nature. I also discussed with him the goals of care. I gave the patient the option of palliative care and hospice referral versus consideration of systemic chemotherapy with carboplatin for AUC of 5 and paclitaxel 175 MG/M2 every 3 weeks with Neulasta support. I discussed with the patient the adverse effect of the chemotherapy including but not limited to alopecia, myelosuppression, nausea and vomiting, peripheral neuropathy, liver or renal dysfunction. He is expected to start the first cycle of his treatment on 02/08/2017. I will arrange for the patient to have a chemotherapy education class before the first dose of his chemotherapy. He would come back for follow-up visit in 3 weeks for evaluation and management of any adverse effect of his treatment. For smoking cessation, I strongly recommended for the patient to quit smoking and discussed with him a smoking cessation plan. He is currently on Nicoderm CQ patches. For hypertension he will continue his current treatment with lisinopril and hydrochlorothiazide. For depression, I started the patient on Remeron 30 mg by mouth daily at bedtime. He was advised to call immediately if he has any concerning symptoms in the interval.  The patient voices understanding of current disease status and treatment  options and is in agreement with the current care plan.  All questions were answered. The patient knows to call the clinic with any problems, questions or concerns. We can certainly see the patient much sooner if necessary.  Thank you so much for  allowing me to participate in the care of [name] [name]. I will continue to follow up the patient with you and assist in his care.  I spent 55 minutes counseling the patient face to face. The total time spent in the appointment was 80 minutes.  Disclaimer: This note was dictated with voice recognition software. Similar sounding words can inadvertently be transcribed and may not be corrected upon review.   Janecia Palau K. January 28, 2017, 11:05 AM

## 2017-01-29 ENCOUNTER — Encounter: Payer: Self-pay | Admitting: Internal Medicine

## 2017-01-29 DIAGNOSIS — Z5111 Encounter for antineoplastic chemotherapy: Secondary | ICD-10-CM | POA: Insufficient documentation

## 2017-01-29 DIAGNOSIS — Z7189 Other specified counseling: Secondary | ICD-10-CM | POA: Insufficient documentation

## 2017-01-29 DIAGNOSIS — F329 Major depressive disorder, single episode, unspecified: Secondary | ICD-10-CM | POA: Insufficient documentation

## 2017-01-29 DIAGNOSIS — Z716 Tobacco abuse counseling: Secondary | ICD-10-CM | POA: Insufficient documentation

## 2017-01-29 DIAGNOSIS — F32A Depression, unspecified: Secondary | ICD-10-CM

## 2017-01-29 HISTORY — DX: Depression, unspecified: F32.A

## 2017-02-02 ENCOUNTER — Encounter: Payer: Self-pay | Admitting: *Deleted

## 2017-02-03 ENCOUNTER — Other Ambulatory Visit: Payer: BLUE CROSS/BLUE SHIELD

## 2017-02-04 ENCOUNTER — Telehealth: Payer: Self-pay | Admitting: *Deleted

## 2017-02-04 ENCOUNTER — Encounter: Payer: Self-pay | Admitting: *Deleted

## 2017-02-04 ENCOUNTER — Encounter (HOSPITAL_COMMUNITY): Payer: Self-pay

## 2017-02-04 NOTE — Telephone Encounter (Signed)
Received call from pt stating that he lives in [place] & is getting ready to start treatment & wonders if there is any housing available to him.  He asked to speak to a SW.  Message left with Polo Riley SW to call pt @ [phone].  Message routed to [facility] also & Dr Mohamed/Pod RN & Navigator Norton Blizzard.

## 2017-02-04 NOTE — Progress Notes (Signed)
[facility]  Clinical Social Work was referred by chemo education nurse for assessment of psychosocial needs; specifically lodging during treatment appointments.  Clinical Social Worker contacted patient by phone to offer support and assess for needs.  Mr. Mccort shared he lives two hours away and is looking for a place to stay before/after treatment.  CSW provided patient with [place] and instructed patient to ask for information on their lodging program that offers free or reduced lodging in our area.  CSW plans to follow up with patient at first infusion appointment.    Maryjean Morn, MSW, LCSW, OSW-C Clinical Social Worker [facility] [phone]

## 2017-02-07 ENCOUNTER — Telehealth: Payer: Self-pay | Admitting: Internal Medicine

## 2017-02-07 NOTE — Telephone Encounter (Signed)
Faxed completed [employer] Term disability forms to [phone] and [phone]. Also called and advised patient that I left a copy at front desk registration for pickup. Scanned copy of completed forms in Epic

## 2017-02-08 ENCOUNTER — Ambulatory Visit (HOSPITAL_COMMUNITY)
Admission: RE | Admit: 2017-02-08 | Discharge: 2017-02-08 | Disposition: A | Discharge status: Home / Self Care | Payer: BLUE CROSS/BLUE SHIELD | Source: Ambulatory Visit | Attending: Internal Medicine | Admitting: Internal Medicine

## 2017-02-08 DIAGNOSIS — C7800 Secondary malignant neoplasm of unspecified lung: Secondary | ICD-10-CM | POA: Insufficient documentation

## 2017-02-08 DIAGNOSIS — G939 Disorder of brain, unspecified: Secondary | ICD-10-CM | POA: Diagnosis not present

## 2017-02-08 DIAGNOSIS — I6782 Cerebral ischemia: Secondary | ICD-10-CM | POA: Insufficient documentation

## 2017-02-08 MED ORDER — GADOBENATE DIMEGLUMINE 529 MG/ML IV SOLN
20.0000 mL | Freq: Once | INTRAVENOUS | Status: AC | PRN
  Administered 2017-02-08: 20 mL via INTRAVENOUS

## 2017-02-09 ENCOUNTER — Ambulatory Visit (HOSPITAL_BASED_OUTPATIENT_CLINIC_OR_DEPARTMENT_OTHER): Payer: BLUE CROSS/BLUE SHIELD

## 2017-02-09 ENCOUNTER — Other Ambulatory Visit (HOSPITAL_BASED_OUTPATIENT_CLINIC_OR_DEPARTMENT_OTHER): Payer: BLUE CROSS/BLUE SHIELD

## 2017-02-09 ENCOUNTER — Other Ambulatory Visit: Payer: Self-pay | Admitting: Internal Medicine

## 2017-02-09 VITALS — BP 154/96 | HR 63 | Temp 97.0°F | Resp 18

## 2017-02-09 DIAGNOSIS — C3411 Malignant neoplasm of upper lobe, right bronchus or lung: Secondary | ICD-10-CM

## 2017-02-09 DIAGNOSIS — Z5189 Encounter for other specified aftercare: Secondary | ICD-10-CM | POA: Diagnosis not present

## 2017-02-09 DIAGNOSIS — C7800 Secondary malignant neoplasm of unspecified lung: Secondary | ICD-10-CM

## 2017-02-09 DIAGNOSIS — Z5111 Encounter for antineoplastic chemotherapy: Secondary | ICD-10-CM

## 2017-02-09 DIAGNOSIS — C3412 Malignant neoplasm of upper lobe, left bronchus or lung: Secondary | ICD-10-CM

## 2017-02-09 LAB — CBC WITH DIFFERENTIAL/PLATELET
BASO%: 0.6 % (ref 0.0–2.0)
Basophils Absolute: 0.1 10*3/uL (ref 0.0–0.1)
EOS%: 3.2 % (ref 0.0–7.0)
Eosinophils Absolute: 0.3 10*3/uL (ref 0.0–0.5)
HCT: 43.5 % (ref 38.4–49.9)
HEMOGLOBIN: 14.4 g/dL (ref 13.0–17.1)
LYMPH%: 42 % (ref 14.0–49.0)
MCH: 28.9 pg (ref 27.2–33.4)
MCHC: 33.1 g/dL (ref 32.0–36.0)
MCV: 87.3 fL (ref 79.3–98.0)
MONO#: 0.9 10*3/uL (ref 0.1–0.9)
MONO%: 9.6 % (ref 0.0–14.0)
NEUT%: 44.6 % (ref 39.0–75.0)
NEUTROS ABS: 4 10*3/uL (ref 1.5–6.5)
Platelets: 228 10*3/uL (ref 140–400)
RBC: 4.98 10*6/uL (ref 4.20–5.82)
RDW: 14.4 % (ref 11.0–14.6)
WBC: 9 10*3/uL (ref 4.0–10.3)
lymph#: 3.8 10*3/uL — ABNORMAL HIGH (ref 0.9–3.3)

## 2017-02-09 LAB — COMPREHENSIVE METABOLIC PANEL
ALBUMIN: 3.6 g/dL (ref 3.5–5.0)
ALK PHOS: 56 U/L (ref 40–150)
ALT: 16 U/L (ref 0–55)
AST: 14 U/L (ref 5–34)
Anion Gap: 8 mEq/L (ref 3–11)
BUN: 17.6 mg/dL (ref 7.0–26.0)
CO2: 27 meq/L (ref 22–29)
Calcium: 9.2 mg/dL (ref 8.4–10.4)
Chloride: 107 mEq/L (ref 98–109)
Creatinine: 1 mg/dL (ref 0.7–1.3)
EGFR: 82 mL/min/{1.73_m2} — AB (ref >90)
GLUCOSE: 98 mg/dL (ref 70–140)
POTASSIUM: 4.2 meq/L (ref 3.5–5.1)
SODIUM: 142 meq/L (ref 136–145)
TOTAL PROTEIN: 6.7 g/dL (ref 6.4–8.3)
Total Bilirubin: 0.29 mg/dL (ref 0.20–1.20)

## 2017-02-09 MED ORDER — PALONOSETRON HCL INJECTION 0.25 MG/5ML
INTRAVENOUS | Status: AC
  Filled 2017-02-09: qty 5

## 2017-02-09 MED ORDER — SODIUM CHLORIDE 0.9 % IV SOLN
597.0000 mg | Freq: Once | INTRAVENOUS | Status: AC
  Administered 2017-02-09: 600 mg via INTRAVENOUS
  Filled 2017-02-09: qty 60

## 2017-02-09 MED ORDER — DIPHENHYDRAMINE HCL 50 MG/ML IJ SOLN
INTRAMUSCULAR | Status: AC
  Filled 2017-02-09: qty 1

## 2017-02-09 MED ORDER — PEGFILGRASTIM 6 MG/0.6ML ~~LOC~~ PSKT
6.0000 mg | PREFILLED_SYRINGE | Freq: Once | SUBCUTANEOUS | Status: AC
  Administered 2017-02-09: 6 mg via SUBCUTANEOUS
  Filled 2017-02-09: qty 0.6

## 2017-02-09 MED ORDER — SODIUM CHLORIDE 0.9% FLUSH
10.0000 mL | INTRAVENOUS | Status: DC | PRN
  Filled 2017-02-09: qty 10

## 2017-02-09 MED ORDER — HEPARIN SOD (PORK) LOCK FLUSH 100 UNIT/ML IV SOLN
500.0000 [IU] | Freq: Once | INTRAVENOUS | Status: DC | PRN
  Filled 2017-02-09: qty 5

## 2017-02-09 MED ORDER — SODIUM CHLORIDE 0.9 % IV SOLN
Freq: Once | INTRAVENOUS | Status: AC
Start: 2017-02-09 — End: 2017-02-09
  Administered 2017-02-09: 09:00:00 via INTRAVENOUS

## 2017-02-09 MED ORDER — DEXAMETHASONE 4 MG PO TABS: 4.0000 mg | ORAL_TABLET | Freq: Three times a day (TID) | ORAL | 0 refills | Status: DC

## 2017-02-09 MED ORDER — PALONOSETRON HCL INJECTION 0.25 MG/5ML
0.2500 mg | Freq: Once | INTRAVENOUS | Status: AC
  Administered 2017-02-09: 0.25 mg via INTRAVENOUS

## 2017-02-09 MED ORDER — LORAZEPAM 2 MG/ML IJ SOLN
INTRAMUSCULAR | Status: AC
  Filled 2017-02-09: qty 1

## 2017-02-09 MED ORDER — SODIUM CHLORIDE 0.9 % IV SOLN
20.0000 mg | Freq: Once | INTRAVENOUS | Status: AC
  Administered 2017-02-09: 20 mg via INTRAVENOUS
  Filled 2017-02-09: qty 2

## 2017-02-09 MED ORDER — DIPHENHYDRAMINE HCL 50 MG/ML IJ SOLN
50.0000 mg | Freq: Once | INTRAMUSCULAR | Status: AC
  Administered 2017-02-09: 50 mg via INTRAVENOUS

## 2017-02-09 MED ORDER — FAMOTIDINE IN NACL 20-0.9 MG/50ML-% IV SOLN
INTRAVENOUS | Status: AC
  Filled 2017-02-09: qty 50

## 2017-02-09 MED ORDER — DEXTROSE 5 % IV SOLN
200.0000 mg/m2 | Freq: Once | INTRAVENOUS | Status: AC
  Administered 2017-02-09: 432 mg via INTRAVENOUS
  Filled 2017-02-09: qty 72

## 2017-02-09 MED ORDER — PALONOSETRON HCL INJECTION 0.25 MG/5ML: 0.2500 mg | Freq: Once | INTRAVENOUS | Status: DC

## 2017-02-09 MED ORDER — FAMOTIDINE IN NACL 20-0.9 MG/50ML-% IV SOLN
20.0000 mg | Freq: Once | INTRAVENOUS | Status: AC
  Administered 2017-02-09: 20 mg via INTRAVENOUS

## 2017-02-10 ENCOUNTER — Telehealth: Payer: Self-pay | Admitting: Medical Oncology

## 2017-02-10 NOTE — Telephone Encounter (Signed)
Decadron got sent originally to wrong pharmacy and pt called and requested it go to [facility] in [place].I called [facility] and they have med for pt and pt aware.

## 2017-02-10 NOTE — Telephone Encounter (Addendum)
returned pt call- He thinks someone from radiation just tried to call him. I transferred the call to xrt.

## 2017-02-15 ENCOUNTER — Ambulatory Visit (HOSPITAL_BASED_OUTPATIENT_CLINIC_OR_DEPARTMENT_OTHER): Payer: BLUE CROSS/BLUE SHIELD | Admitting: Internal Medicine

## 2017-02-15 ENCOUNTER — Encounter: Payer: Self-pay | Admitting: Internal Medicine

## 2017-02-15 ENCOUNTER — Other Ambulatory Visit (HOSPITAL_BASED_OUTPATIENT_CLINIC_OR_DEPARTMENT_OTHER): Payer: BLUE CROSS/BLUE SHIELD

## 2017-02-15 VITALS — BP 135/80 | HR 78 | Temp 98.0°F | Resp 20 | Ht 69.0 in | Wt 221.5 lb

## 2017-02-15 DIAGNOSIS — C7800 Secondary malignant neoplasm of unspecified lung: Secondary | ICD-10-CM

## 2017-02-15 DIAGNOSIS — C7931 Secondary malignant neoplasm of brain: Secondary | ICD-10-CM

## 2017-02-15 DIAGNOSIS — Z5111 Encounter for antineoplastic chemotherapy: Secondary | ICD-10-CM

## 2017-02-15 DIAGNOSIS — C3490 Malignant neoplasm of unspecified part of unspecified bronchus or lung: Secondary | ICD-10-CM

## 2017-02-15 DIAGNOSIS — Z8719 Personal history of other diseases of the digestive system: Secondary | ICD-10-CM

## 2017-02-15 HISTORY — DX: Personal history of other diseases of the digestive system: Z87.19

## 2017-02-15 LAB — CBC WITH DIFFERENTIAL/PLATELET
BASO%: 0.3 % (ref 0.0–2.0)
Basophils Absolute: 0.1 10*3/uL (ref 0.0–0.1)
EOS%: 1.3 % (ref 0.0–7.0)
Eosinophils Absolute: 0.2 10*3/uL (ref 0.0–0.5)
HEMATOCRIT: 47.8 % (ref 38.4–49.9)
HGB: 15.9 g/dL (ref 13.0–17.1)
LYMPH#: 2 10*3/uL (ref 0.9–3.3)
LYMPH%: 12.8 % — ABNORMAL LOW (ref 14.0–49.0)
MCH: 28.7 pg (ref 27.2–33.4)
MCHC: 33.3 g/dL (ref 32.0–36.0)
MCV: 86.3 fL (ref 79.3–98.0)
MONO#: 0.9 10*3/uL (ref 0.1–0.9)
MONO%: 5.5 % (ref 0.0–14.0)
NEUT#: 12.4 10*3/uL — ABNORMAL HIGH (ref 1.5–6.5)
NEUT%: 80.1 % — ABNORMAL HIGH (ref 39.0–75.0)
Platelets: 187 10*3/uL (ref 140–400)
RBC: 5.54 10*6/uL (ref 4.20–5.82)
RDW: 14.6 % (ref 11.0–14.6)
WBC: 15.5 10*3/uL — ABNORMAL HIGH (ref 4.0–10.3)

## 2017-02-15 LAB — COMPREHENSIVE METABOLIC PANEL
ALBUMIN: 4 g/dL (ref 3.5–5.0)
ALK PHOS: 86 U/L (ref 40–150)
ALT: 19 U/L (ref 0–55)
AST: 11 U/L (ref 5–34)
Anion Gap: 9 mEq/L (ref 3–11)
BUN: 29.5 mg/dL — AB (ref 7.0–26.0)
CALCIUM: 9.8 mg/dL (ref 8.4–10.4)
CHLORIDE: 99 meq/L (ref 98–109)
CO2: 28 mEq/L (ref 22–29)
CREATININE: 1 mg/dL (ref 0.7–1.3)
EGFR: 82 mL/min/{1.73_m2} — ABNORMAL LOW (ref >90)
Glucose: 101 mg/dl (ref 70–140)
Potassium: 4.6 mEq/L (ref 3.5–5.1)
Sodium: 135 mEq/L — ABNORMAL LOW (ref 136–145)
Total Bilirubin: 0.64 mg/dL (ref 0.20–1.20)
Total Protein: 7.1 g/dL (ref 6.4–8.3)

## 2017-02-15 MED ORDER — OXYCODONE-ACETAMINOPHEN 5-325 MG PO TABS: 1.0000 | ORAL_TABLET | Freq: Four times a day (QID) | ORAL | 0 refills | Status: DC | PRN

## 2017-02-15 NOTE — Progress Notes (Signed)
[place] Telephone:([phone]) [phone]   Fax:([phone]) [phone]  OFFICE PROGRESS NOTE  Pcp Not In System No address on file  DIAGNOSIS: Stage IV (T3, N0, M1 a) non-small cell lung cancer, squamous cell carcinoma presented with multiple bilateral pulmonary nodules diagnosed in April 2018.  PRIOR THERAPY: None.  CURRENT THERAPY: Systemic chemotherapy with carboplatin for AUC of 5 and paclitaxel 175 MG/M2 every 3 weeks with Neulasta support.  INTERVAL HISTORY: [name] [name] 66 y.o. male returns to the clinic today for follow-up visit accompanied by his girlfriend. The patient was started on systemic chemotherapy with carboplatin and paclitaxel last week. He tolerated the first week of his treatment well except for aching pain all over his body after the Neulasta injection. He denied having any fever or chills. She has no nausea, vomiting, diarrhea or constipation. The patient denied having any significant weight loss or night sweats. He has no chest pain, shortness breath, cough or hemoptysis. He had recent MRI of the brain that showed evidence for metastatic disease to the brain. He is here today for evaluation and management of any adverse effect of his treatment.  MEDICAL HISTORY: Past Medical History:  Diagnosis Date  . Depression 01/29/2017  . Dyspnea   . Encounter for antineoplastic chemotherapy 01/29/2017  . Goals of care, counseling/discussion 01/29/2017  . Hypertension   . Metastatic squamous cell carcinoma to lung, unspecified laterality ([place]) 01/28/2017    ALLERGIES:  is allergic to no known allergies.  MEDICATIONS:  Current Outpatient Prescriptions  Medication Sig Dispense Refill  . acetaminophen (TYLENOL) 325 MG tablet Take 325-650 mg by mouth every 6 (six) hours as needed (for aches/pains).    Marland Kitchen aspirin 81 MG chewable tablet Chew 81 mg by mouth daily.    Marland Kitchen dexamethasone (DECADRON) 4 MG tablet Take 1 tablet (4 mg total) by mouth 3 (three) times daily. 30 tablet 0   . hydrochlorothiazide (HYDRODIURIL) 25 MG tablet Take 25 mg by mouth daily.    Marland Kitchen lisinopril (PRINIVIL,ZESTRIL) 40 MG tablet Take 40 mg by mouth daily.    . mirtazapine (REMERON) 30 MG tablet Take 1 tablet (30 mg total) by mouth at bedtime. 30 tablet 1  . nicotine (NICODERM CQ - DOSED IN MG/24 HOURS) 21 mg/24hr patch Place 1 patch (21 mg total) onto the skin daily. 28 patch 1  . prochlorperazine (COMPAZINE) 10 MG tablet Take 1 tablet (10 mg total) by mouth every 6 (six) hours as needed for nausea or vomiting. 30 tablet 0   No current facility-administered medications for this visit.     SURGICAL HISTORY:  Past Surgical History:  Procedure Laterality Date  . BACK SURGERY     cyst removal  . COLONOSCOPY    . MINOR PLACEMENT OF FIDUCIAL N/A 01/19/2017   Procedure: MINOR PLACEMENT OF FIDUCIAL x 6;  Surgeon: Collene Gobble, MD;  Location: [facility];  Service: Thoracic;  Laterality: N/A;  . VIDEO BRONCHOSCOPY WITH ENDOBRONCHIAL NAVIGATION N/A 01/19/2017   Procedure: VIDEO BRONCHOSCOPY WITH ENDOBRONCHIAL NAVIGATION;  Surgeon: Collene Gobble, MD;  Location: [facility];  Service: Thoracic;  Laterality: N/A;    REVIEW OF SYSTEMS:  Constitutional: positive for fatigue Eyes: negative Ears, nose, mouth, throat, and face: negative Respiratory: positive for dyspnea on exertion Cardiovascular: negative Gastrointestinal: negative Genitourinary:negative Integument/breast: negative Hematologic/lymphatic: negative Musculoskeletal:positive for arthralgias Neurological: negative Behavioral/Psych: negative Endocrine: negative Allergic/Immunologic: negative   PHYSICAL EXAMINATION: General appearance: alert, cooperative, fatigued and no distress Head: Normocephalic, without obvious abnormality, atraumatic  Neck: no adenopathy, no JVD, supple, symmetrical, trachea midline and thyroid not enlarged, symmetric, no tenderness/mass/nodules Lymph nodes: Cervical, supraclavicular, and axillary nodes normal. Resp: clear to  auscultation bilaterally Back: symmetric, no curvature. ROM normal. No CVA tenderness. Cardio: regular rate and rhythm, S1, S2 normal, no murmur, click, rub or gallop GI: soft, non-tender; bowel sounds normal; no masses,  no organomegaly Extremities: extremities normal, atraumatic, no cyanosis or edema Neurologic: Alert and oriented X 3, normal strength and tone. Normal symmetric reflexes. Normal coordination and gait  ECOG PERFORMANCE STATUS: 1 - Symptomatic but completely ambulatory  Blood pressure 135/80, pulse 78, temperature 98 F (36.7 C), temperature source Oral, resp. rate 20, height '5\' 9"'$  (1.753 m), weight 221 lb 8 oz (100.5 kg), SpO2 98 %.  LABORATORY DATA: Lab Results  Component Value Date   WBC 15.5 (H) 02/15/2017   HGB 15.9 02/15/2017   HCT 47.8 02/15/2017   MCV 86.3 02/15/2017   PLT 187 02/15/2017      Chemistry      Component Value Date/Time   NA 135 (L) 02/15/2017 1116   K 4.6 02/15/2017 1116   CL 104 01/19/2017 0633   CO2 28 02/15/2017 1116   BUN 29.5 (H) 02/15/2017 1116   CREATININE 1.0 02/15/2017 1116      Component Value Date/Time   CALCIUM 9.8 02/15/2017 1116   ALKPHOS 86 02/15/2017 1116   AST 11 02/15/2017 1116   ALT 19 02/15/2017 1116   BILITOT 0.64 02/15/2017 1116       RADIOGRAPHIC STUDIES: Mr Jeri Cos FI Contrast  Result Date: 02/09/2017 CLINICAL DATA:  Lung cancer initial staging EXAM: MRI HEAD WITHOUT AND WITH CONTRAST TECHNIQUE: Multiplanar, multiecho pulse sequences of the brain and surrounding structures were obtained without and with intravenous contrast. CONTRAST:  2m MULTIHANCE GADOBENATE DIMEGLUMINE 529 MG/ML IV SOLN COMPARISON:  None. FINDINGS: Brain: The midline structures are normal. No focal diffusion restriction to indicate acute infarct. No intraparenchymal hemorrhage. There is multifocal hyperintense T2-weighted signal within the periventricular, subcortical and deep white matter, most often seen in the setting of chronic  microvascular ischemia. There are multiple contrast-enhancing lesions localize within the right frontal lobe, in association with prominence cortical vessels. The largest lesion measures 1.3 x 0.7 cm. The lesion shown on series 11, image 42, which measures 11 x 7 mm, has mild associated edema. The other lesions have minimal surrounding edema. There is no diffusion restriction associated with the lesions. There is no midline shift or other mass effect. No chronic microhemorrhage or cerebral amyloid angiopathy. No hydrocephalus, age advanced atrophy or lobar predominant volume loss. No extra-axial collection. Vascular: Major intracranial arterial and venous sinus flow voids are preserved. Skull and upper cervical spine: The visualized skull base, calvarium, upper cervical spine and extracranial soft tissues are normal. Sinuses/Orbits: No fluid levels or advanced mucosal thickening. No mastoid effusion. Normal orbits. IMPRESSION: 1. Multiple contrast-enhancing lesions clustered within the right frontal lobe. In the context of metastatic lung cancer, metastases are a primary concern. However, this is not the typical appearance of brain parenchymal metastases, which usually have a more rounded morphology and greater associated vasogenic edema. Additionally, there are prominent cortical vessels associated with these lesions and the lesion margins are somewhat indistinct. Vasculitis can have this appearance, as can lymphoma, though the latter is less likely. CTA of the head might be helpful to further assess the vessels closely associated with these lesions and to provide more global assessment of the cerebral arteries. 2. No acute ischemia  or intracranial hemorrhage. 3. Chronic microvascular ischemia. Electronically Signed   By: Ulyses Jarred M.D.   On: 02/09/2017 02:54   Dg Chest Port 1 View  Result Date: 01/19/2017 CLINICAL DATA:  Postop bronchoscopy. EXAM: PORTABLE CHEST 1 VIEW COMPARISON:  None. FINDINGS: The heart  size and mediastinal contours are within normal limits. Atherosclerosis of thoracic aorta is noted. No pneumothorax or pleural effusion is noted. Elevated left hemidiaphragm is noted. Both lungs are clear. The visualized skeletal structures are unremarkable. IMPRESSION: Aortic atherosclerosis. Elevated left hemidiaphragm. No pneumothorax seen. Electronically Signed   By: Marijo Conception, M.D.   On: 01/19/2017 11:04   Dg C-arm Bronchoscopy  Result Date: 01/19/2017 C-ARM BRONCHOSCOPY: Fluoroscopy was utilized by the requesting physician.  No radiographic interpretation.    ASSESSMENT AND PLAN:  This is a very pleasant 66 years old white male with a stage IV non-small cell lung cancer, squamous cell carcinoma presented with multiple bilateral pulmonary nodules in addition to metastatic brain lesions. The patient was started recently on systemic chemotherapy with carbo platinum and paclitaxel status post 1 cycle. He try to the first week of his treatment well with no significant adverse effects except for the aching pain after the Neulasta injection. He had recent MRI of the brain that showed multiple metastatic brain lesion. I previously discussed the results with the patient and recommended for him to see Dr. Tammi Klippel from radiation oncology for consideration of palliative radiotherapy. The patient was also started on Decadron for the vasogenic edema. For pain management, I gave the patient prescription for Percocet 5/325 mg by mouth every 6 hours as needed for pain. I would see the patient back for follow-up visit in 2 weeks for evaluation with the start of cycle #2. He was advised to call immediately if he has any concerning symptoms in the interval. The patient voices understanding of current disease status and treatment options and is in agreement with the current care plan. All questions were answered. The patient knows to call the clinic with any problems, questions or concerns. We can certainly  see the patient much sooner if necessary.  Disclaimer: This note was dictated with voice recognition software. Similar sounding words can inadvertently be transcribed and may not be corrected upon review.

## 2017-02-16 NOTE — Telephone Encounter (Signed)
Received Disability papers that were for the employer to complete. Called patient to advise. He gave me fax [phone] to fax forms to [place] at his employer which I did

## 2017-02-17 ENCOUNTER — Telehealth: Payer: Self-pay | Admitting: Medical Oncology

## 2017-02-17 ENCOUNTER — Other Ambulatory Visit: Payer: Self-pay | Admitting: Internal Medicine

## 2017-02-17 DIAGNOSIS — C7800 Secondary malignant neoplasm of unspecified lung: Secondary | ICD-10-CM

## 2017-02-17 NOTE — Telephone Encounter (Signed)
-----   Message from [name], MD sent at 02/17/2017  8:38 AM EDT ----- Regarding: FW: tumor board I will refer him to Neurology. Brain tumor board think it is stroke rather than brain metastases. Please let the patient knows. ----- Message ----- From: Pincus Large Sent: 02/16/2017   1:48 PM To: Valrie Hart, RN, [name], MD Subject: Melton Alar: tumor board                                This patient was referred to be seen by Rad Onc for attention to brain mets, but upon review in conference, it is felt that the areas of enhancement on his recent MRI are probably emboli strokes rather than brain mets. The plan is to re-scan in 2 months and re-evaluate at that time. Please let me know what I can do to help out with this.  Manuela Schwartz  ----- Message ----- From: Eppie Gibson, MD Sent: 02/14/2017   4:55 PM To: Pincus Large Subject: tumor board                                    Manuela Schwartz, Per tumor board, MRI brain most c/w emboli strokes.  Please let med/onc know that board recommends neurology referral for stroke workup and another brain MRI to verify lesions are resolving appropriately in 8wks.  Thanks  (BTW, If pt needs rad/onc in future, I am still only seeing CNS referrals in which I'm specifically requested or if I was involved in their care in the past). SS

## 2017-02-17 NOTE — Telephone Encounter (Signed)
Pt notified and  he correctly  verbalized back to me  what I told him.

## 2017-02-18 ENCOUNTER — Telehealth: Payer: Self-pay | Admitting: Internal Medicine

## 2017-02-18 NOTE — Telephone Encounter (Signed)
Per referral and sch message - contacted [facility] - unable to reach them office closed at 12pm per recording. Will call again Monday

## 2017-02-18 NOTE — Telephone Encounter (Signed)
Per 5/1 LOS no additional appts need patient already has 3 cycles scheduled . Left message for referral for  [facility] - will reach out to them again Monday

## 2017-02-21 NOTE — Telephone Encounter (Signed)
Per Inez Catalina at [facility] - they will contact the patient.

## 2017-02-22 ENCOUNTER — Encounter: Payer: Self-pay | Admitting: *Deleted

## 2017-02-22 ENCOUNTER — Other Ambulatory Visit (HOSPITAL_BASED_OUTPATIENT_CLINIC_OR_DEPARTMENT_OTHER): Payer: BLUE CROSS/BLUE SHIELD

## 2017-02-22 DIAGNOSIS — C3490 Malignant neoplasm of unspecified part of unspecified bronchus or lung: Secondary | ICD-10-CM | POA: Diagnosis not present

## 2017-02-22 DIAGNOSIS — C7800 Secondary malignant neoplasm of unspecified lung: Secondary | ICD-10-CM

## 2017-02-22 DIAGNOSIS — C7931 Secondary malignant neoplasm of brain: Secondary | ICD-10-CM

## 2017-02-22 LAB — CBC WITH DIFFERENTIAL/PLATELET
BASO%: 0.2 % (ref 0.0–2.0)
Basophils Absolute: 0 10*3/uL (ref 0.0–0.1)
EOS ABS: 0 10*3/uL (ref 0.0–0.5)
EOS%: 0 % (ref 0.0–7.0)
HEMATOCRIT: 45.5 % (ref 38.4–49.9)
HEMOGLOBIN: 15.1 g/dL (ref 13.0–17.1)
LYMPH#: 1.4 10*3/uL (ref 0.9–3.3)
LYMPH%: 7.9 % — ABNORMAL LOW (ref 14.0–49.0)
MCH: 28.9 pg (ref 27.2–33.4)
MCHC: 33.3 g/dL (ref 32.0–36.0)
MCV: 86.7 fL (ref 79.3–98.0)
MONO#: 0.8 10*3/uL (ref 0.1–0.9)
MONO%: 4.8 % (ref 0.0–14.0)
NEUT%: 87.1 % — ABNORMAL HIGH (ref 39.0–75.0)
NEUTROS ABS: 15.4 10*3/uL — AB (ref 1.5–6.5)
PLATELETS: 200 10*3/uL (ref 140–400)
RBC: 5.24 10*6/uL (ref 4.20–5.82)
RDW: 15 % — AB (ref 11.0–14.6)
WBC: 17.6 10*3/uL — AB (ref 4.0–10.3)

## 2017-02-22 LAB — COMPREHENSIVE METABOLIC PANEL
ALBUMIN: 3.7 g/dL (ref 3.5–5.0)
ALK PHOS: 78 U/L (ref 40–150)
ALT: 23 U/L (ref 0–55)
AST: 12 U/L (ref 5–34)
Anion Gap: 9 mEq/L (ref 3–11)
BILIRUBIN TOTAL: 0.57 mg/dL (ref 0.20–1.20)
BUN: 28.5 mg/dL — ABNORMAL HIGH (ref 7.0–26.0)
CALCIUM: 9.1 mg/dL (ref 8.4–10.4)
CO2: 27 meq/L (ref 22–29)
CREATININE: 0.9 mg/dL (ref 0.7–1.3)
Chloride: 99 mEq/L (ref 98–109)
EGFR: 90 mL/min/{1.73_m2} — ABNORMAL LOW (ref >90)
Glucose: 127 mg/dl (ref 70–140)
Potassium: 4.7 mEq/L (ref 3.5–5.1)
Sodium: 135 mEq/L — ABNORMAL LOW (ref 136–145)
TOTAL PROTEIN: 6.8 g/dL (ref 6.4–8.3)

## 2017-02-23 ENCOUNTER — Encounter: Payer: Self-pay | Admitting: Diagnostic Neuroimaging

## 2017-02-23 ENCOUNTER — Ambulatory Visit (INDEPENDENT_AMBULATORY_CARE_PROVIDER_SITE_OTHER): Payer: BLUE CROSS/BLUE SHIELD | Admitting: Diagnostic Neuroimaging

## 2017-02-23 VITALS — BP 123/74 | HR 78 | Ht 68.5 in | Wt 221.2 lb

## 2017-02-23 DIAGNOSIS — I63131 Cerebral infarction due to embolism of right carotid artery: Secondary | ICD-10-CM | POA: Diagnosis not present

## 2017-02-23 DIAGNOSIS — C7800 Secondary malignant neoplasm of unspecified lung: Secondary | ICD-10-CM | POA: Diagnosis not present

## 2017-02-23 DIAGNOSIS — R9089 Other abnormal findings on diagnostic imaging of central nervous system: Secondary | ICD-10-CM

## 2017-02-23 DIAGNOSIS — C7931 Secondary malignant neoplasm of brain: Secondary | ICD-10-CM | POA: Diagnosis not present

## 2017-02-23 NOTE — Progress Notes (Addendum)
[facility]  PATIENT: [name] [name] DOB: [birth year]  REFERRING CLINICIAN: Mayme Genta, MD HISTORY FROM: patient and girlfriend REASON FOR VISIT: new consult    HISTORICAL  CHIEF COMPLAINT:  Chief Complaint  Patient presents with  . Question of multiple strokes    rm 6, New Pt, friend- Freda    HISTORY OF PRESENT ILLNESS:   66 year old male here for evaluation of abnormal MRI brain and possible embolic strokes.  December 2017 patient was having intermittent left hand numbness. He noticed some mild heaviness with left hand. This has been intermittent but improved since that time.  March 2018 patient was having chest pain, cough, had abnormal chest x-ray, and ultimately was found to have stage IV (T3 N0 M1 a) non-small cell lung cancer, squamous cell carcinoma with bilateral pulmonary nodules. Patient had MRI of the brain for lungs cancer staging was found to have abnormal lesions in the right frontal lobe, possibly reign metastases. These were reviewed in tumor board and possibility of embolic strokes was raised. Therefore patient referred to me for further evaluation.  Patient denies any left face or left leg problems. He does report some mild left hand weakness which date back to December 2017. He has had some intermittent confusion, getting lost while driving, short temper, increased anxiety, poor sleep patterns. Some of these precede cancer diagnosis and some of them have worsened since cancer diagnosis.  Apparently patient had carotid ultrasound with PCP a few months ago which showed "vein blockage" in some location is neck.    REVIEW OF SYSTEMS: Full 14 system review of systems performed and negative with exception of: Fatigue.  ALLERGIES: Allergies  Allergen Reactions  . No Known Allergies     HOME MEDICATIONS: Outpatient Medications Prior to Visit  Medication Sig Dispense Refill  . acetaminophen (TYLENOL) 325 MG tablet Take 325-650 mg by mouth  every 6 (six) hours as needed (for aches/pains).    Marland Kitchen aspirin 81 MG chewable tablet Chew 81 mg by mouth daily.    Marland Kitchen dexamethasone (DECADRON) 4 MG tablet Take 1 tablet (4 mg total) by mouth 3 (three) times daily. 30 tablet 0  . hydrochlorothiazide (HYDRODIURIL) 25 MG tablet Take 25 mg by mouth daily.    Marland Kitchen lisinopril (PRINIVIL,ZESTRIL) 40 MG tablet Take 40 mg by mouth daily.    . mirtazapine (REMERON) 30 MG tablet Take 1 tablet (30 mg total) by mouth at bedtime. 30 tablet 1  . oxyCODONE-acetaminophen (PERCOCET/ROXICET) 5-325 MG tablet Take 1 tablet by mouth every 6 (six) hours as needed for severe pain. 30 tablet 0  . prochlorperazine (COMPAZINE) 10 MG tablet Take 1 tablet (10 mg total) by mouth every 6 (six) hours as needed for nausea or vomiting. 30 tablet 0  . nicotine (NICODERM CQ - DOSED IN MG/24 HOURS) 21 mg/24hr patch Place 1 patch (21 mg total) onto the skin daily. (Patient not taking: Reported on 02/23/2017) 28 patch 1   No facility-administered medications prior to visit.     PAST MEDICAL HISTORY: Past Medical History:  Diagnosis Date  . Brain metastases ([place]) 02/15/2017  . Depression 01/29/2017  . Dyspnea   . Encounter for antineoplastic chemotherapy 01/29/2017  . Goals of care, counseling/discussion 01/29/2017  . Hypertension   . Metastatic squamous cell carcinoma to lung, unspecified laterality ([place]) 01/28/2017    PAST SURGICAL HISTORY: Past Surgical History:  Procedure Laterality Date  . BACK SURGERY     cyst removal  . COLONOSCOPY    . MINOR PLACEMENT  OF FIDUCIAL N/A 01/19/2017   Procedure: MINOR PLACEMENT OF FIDUCIAL x 6;  Surgeon: Collene Gobble, MD;  Location: [facility] OR;  Service: Thoracic;  Laterality: N/A;  . VIDEO BRONCHOSCOPY WITH ENDOBRONCHIAL NAVIGATION N/A 01/19/2017   Procedure: VIDEO BRONCHOSCOPY WITH ENDOBRONCHIAL NAVIGATION;  Surgeon: Collene Gobble, MD;  Location: [facility];  Service: Thoracic;  Laterality: N/A;    FAMILY HISTORY: Family History  Problem Relation Age of  Onset  . Cancer Father     unsure of what type    SOCIAL HISTORY:  Social History   Social History  . Marital status: Single    Spouse name: N/A  . Number of children: 0  . Years of education: 10   Occupational History  .      NA   Social History Main Topics  . Smoking status: Current Every Day Smoker    Packs/day: 1.00    Years: 45.00    Types: Cigarettes  . Smokeless tobacco: Never Used  . Alcohol use No  . Drug use: No  . Sexual activity: Not on file   Other Topics Concern  . Not on file   Social History Narrative   Lives alone   Caffeine- coffee, 5 cups daily     PHYSICAL EXAM  GENERAL EXAM/CONSTITUTIONAL: Vitals:  Vitals:   02/23/17 1111  BP: 123/74  Pulse: 78  Weight: 221 lb 3.2 oz (100.3 kg)  Height: 5' 8.5" (1.74 m)     Body mass index is 33.14 kg/m.  Visual Acuity Screening   Right eye Left eye Both eyes  Without correction:     With correction: 20/30 20/20      Patient is in no distress; well developed, nourished and groomed; neck is supple  CARDIOVASCULAR:  Examination of carotid arteries is normal; no carotid bruits  Regular rate and rhythm, no murmurs  Examination of peripheral vascular system by observation and palpation is normal  EYES:  Ophthalmoscopic exam of optic discs and posterior segments is normal; no papilledema or hemorrhages  MUSCULOSKELETAL:  Gait, strength, tone, movements noted in Neurologic exam below  NEUROLOGIC: MENTAL STATUS:  No flowsheet data found.  awake, alert, oriented to person, place and time  recent and remote memory intact  normal attention and concentration  language fluent, comprehension intact, naming intact,   fund of knowledge appropriate  CRANIAL NERVE:   2nd - no papilledema on fundoscopic exam  2nd, 3rd, 4th, 6th - pupils equal and reactive to light, visual fields full to confrontation, extraocular muscles intact, no nystagmus  5th - facial sensation symmetric  7th -  facial strength symmetric  8th - hearing intact  9th - palate elevates symmetrically, uvula midline  11th - shoulder shrug symmetric  12th - tongue protrusion midline  MOTOR:   normal bulk and tone, full strength in the BUE, BLE  RIGHT ARM SLIGHTLY ORBITS AROUND LEFT ARM  SENSORY:   normal and symmetric to light touch, pinprick, temperature, vibration  EXCEPT SLIGHTLY DECR TEMP IN LEFT HAND  COORDINATION:   finger-nose-finger, fine finger movements normal  REFLEXES:   deep tendon reflexes present and symmetric  GAIT/STATION:   narrow based gait; able to walk tandem; romberg is negative    DIAGNOSTIC DATA (LABS, IMAGING, TESTING) - I reviewed patient records, labs, notes, testing and imaging myself where available.  Lab Results  Component Value Date   WBC 17.6 (H) 02/22/2017   HGB 15.1 02/22/2017   HCT 45.5 02/22/2017   MCV 86.7 02/22/2017  PLT 200 02/22/2017      Component Value Date/Time   NA 135 (L) 02/22/2017 1039   K 4.7 02/22/2017 1039   CL 104 01/19/2017 0633   CO2 27 02/22/2017 1039   GLUCOSE 127 02/22/2017 1039   BUN 28.5 (H) 02/22/2017 1039   CREATININE 0.9 02/22/2017 1039   CALCIUM 9.1 02/22/2017 1039   PROT 6.8 02/22/2017 1039   ALBUMIN 3.7 02/22/2017 1039   AST 12 02/22/2017 1039   ALT 23 02/22/2017 1039   ALKPHOS 78 02/22/2017 1039   BILITOT 0.57 02/22/2017 1039   GFRNONAA >60 01/19/2017 0633   GFRAA >60 01/19/2017 4098   No results found for: CHOL, HDL, LDLCALC, LDLDIRECT, TRIG, CHOLHDL No results found for: HGBA1C No results found for: VITAMINB12 No results found for: TSH   02/08/17 MRI brain [I reviewed images myself and agree with interpretation. In addition the right ICA flow void appears irregular and suggests proximal stenosis or occlusion. -VRP]  1. Multiple contrast-enhancing lesions clustered within the right frontal lobe. In the context of metastatic lung cancer, metastases are a primary concern. However, this is not  the typical appearance of brain parenchymal metastases, which usually have a more rounded morphology and greater associated vasogenic edema. Additionally, there are prominent cortical vessels associated with these lesions and the lesion margins are somewhat indistinct. Vasculitis can have this appearance, as can lymphoma, though the latter is less likely. CTA of the head might be helpful to further assess the vessels closely associated with these lesions and to provide more global assessment of the cerebral arteries. 2. No acute ischemia or intracranial hemorrhage. 3. Chronic microvascular ischemia.    ASSESSMENT AND PLAN  66 y.o. year old male here with stage IV (T3 N0 M1 a) non-small cell lung cancer, squamous cell carcinoma with bilateral pulmonary nodules.   Now found to have abnormal right frontal lobe lesions, enhancing, raising possibility of cerebral metastases versus cerebral infarctions. I reviewed imaging myself and there are features for both possibilities. If these are ischemic infarctions, they tend to fall in the border zone watershed region between the right anterior cerebral artery and right middle cerebral artery. In combination with the right ICA flow void abnormality, this would fit with a watershed ischemic infarction pattern. Will proceed with further workup.   Ddx: right frontal ischemic infarctions (watershed vs embolic infarcts due to right ICA stenosis or occlusion) vs cerebral metastases  1. Cerebrovascular accident (CVA) due to embolism of right carotid artery ([place])   2. Abnormal finding on MRI of brain   3. Metastatic squamous cell carcinoma to lung, unspecified laterality ([place])   4. Brain metastases ([place])      PLAN:  ABNORMAL MRI BRAIN WORKUP (STROKE VS METASTASES) - check CTA head / neck --> follow up abnormal right ICA flow void on MRI brain - check 2D echocardiogram - check lipids, A1c - agree with repeat MRI brain w/wo in 8 weeks  STROKE  PREVENTION - continue aspirin, lisinopril, HCTZ  LUNG CANCER  - per Dr. Julien Nordmann  Orders Placed This Encounter  Procedures  . CT ANGIO HEAD W OR WO CONTRAST  . CT ANGIO NECK W OR WO CONTRAST  . Lipid Panel  . Hemoglobin A1c  . ECHOCARDIOGRAM COMPLETE   Return in about 3 months (around 05/26/2017).  I reviewed images, labs, notes, records myself. I summarized findings and reviewed with patient, for this high risk condition (strokes vs cerebral metastases) requiring high complexity decision making.    Penni Bombard,  MD 02/20/2562, 89:37 AM Certified in Neurology, Neurophysiology and Neuroimaging  [facility] [address] [place], [place] [address] [phone]

## 2017-02-23 NOTE — Patient Instructions (Signed)

## 2017-02-23 NOTE — Progress Notes (Signed)
[facility] Clinical Social Work  Clinical Social Work met with patient in CSW office.  Patient inquiring about setting up appointment to complete healthcare advance directives.  CSW scheduled to see patient during infusion on May 15th.   Mr. Henriques drives four hours round trip from [place], [place] to attend cancer appointments.  The patient shared he contacted [employer] at CSW recommendation, but they were unable to provide any support.  CSW signed up patient for [employer] grant and sent in application for  [employer] gas card.   Mullis , MSW, LCSW, OSW-C Clinical Social Worker [facility] [phone]        

## 2017-02-24 ENCOUNTER — Telehealth: Payer: Self-pay | Admitting: Medical Oncology

## 2017-02-24 ENCOUNTER — Telehealth: Payer: Self-pay | Admitting: *Deleted

## 2017-02-24 ENCOUNTER — Other Ambulatory Visit: Payer: Self-pay | Admitting: Radiation Therapy

## 2017-02-24 ENCOUNTER — Other Ambulatory Visit: Payer: Self-pay | Admitting: Medical Oncology

## 2017-02-24 DIAGNOSIS — C7800 Secondary malignant neoplasm of unspecified lung: Secondary | ICD-10-CM

## 2017-02-24 LAB — LIPID PANEL
Chol/HDL Ratio: 2.8 ratio (ref 0.0–5.0)
Cholesterol, Total: 155 mg/dL (ref 100–199)
HDL: 55 mg/dL (ref >39)
LDL CALC: 82 mg/dL (ref 0–99)
Triglycerides: 90 mg/dL (ref 0–149)
VLDL CHOLESTEROL CAL: 18 mg/dL (ref 5–40)

## 2017-02-24 LAB — HEMOGLOBIN A1C
ESTIMATED AVERAGE GLUCOSE: 137 mg/dL
HEMOGLOBIN A1C: 6.4 % — AB (ref 4.8–5.6)

## 2017-02-24 NOTE — Telephone Encounter (Signed)
[name], MD  Ardeen Garland, RN        I will wait until I see him.    Message given to pt.I confirmed appts with him.

## 2017-02-24 NOTE — Telephone Encounter (Signed)
"  That neurology doctor you all sent me to yesterday said he sent Dr. Julien Nordmann a message to prescribe a nerve pill for me.  Did you all receive this request?  I do not know his name  May have been a Dr. Suzan Nailer"    Perhaps was seen by a Dr. Leta Baptist with [facility].  Nothing located in EPIC in reference to this communication.   Call forwarded to collaborative.

## 2017-02-24 NOTE — Telephone Encounter (Signed)
-----   Message from Penni Bombard, MD sent at 02/24/2017  1:05 PM EDT ----- Has elevate A1c and needs PCP follow up of borderline diabetes. Continue current plan. Please call patient. -VRP

## 2017-02-24 NOTE — Telephone Encounter (Signed)
Called to say that the doctor he saw yesterday was going to give him a medication "to calm me down". I told him to call Dr Tish Frederickson.

## 2017-02-24 NOTE — Telephone Encounter (Signed)
Spoke to pt and relayed that his HbgA1C is elevated to 6.4 (prediabetes range).  He had been on decadron for the last month but is not taking anymore.  ? Elevated level.   I told him to follow up with his pcp, Dr. Woody Seller in [place], [place].  He stated he would in a day or 2.

## 2017-02-24 NOTE — Telephone Encounter (Signed)
I called pt and told him I will talk to [facility] about his anxiety and will call him back today or tomorrow. He voiced understanding.

## 2017-02-25 ENCOUNTER — Encounter (HOSPITAL_COMMUNITY): Admission: EM | Disposition: A | Discharge status: Home / Self Care | Payer: Self-pay | Source: Home / Self Care

## 2017-02-25 ENCOUNTER — Inpatient Hospital Stay (HOSPITAL_COMMUNITY)
Admission: EM | Admit: 2017-02-25 | Discharge: 2017-03-03 | DRG: 853 | Disposition: A | Discharge status: Home / Self Care | Payer: BLUE CROSS/BLUE SHIELD | Attending: General Surgery | Admitting: General Surgery

## 2017-02-25 ENCOUNTER — Encounter (HOSPITAL_COMMUNITY): Payer: Self-pay | Admitting: Emergency Medicine

## 2017-02-25 ENCOUNTER — Encounter: Payer: Self-pay | Admitting: Radiation Oncology

## 2017-02-25 ENCOUNTER — Telehealth: Payer: Self-pay | Admitting: *Deleted

## 2017-02-25 ENCOUNTER — Emergency Department (HOSPITAL_COMMUNITY): Payer: BLUE CROSS/BLUE SHIELD | Admitting: Certified Registered Nurse Anesthetist

## 2017-02-25 DIAGNOSIS — E1165 Type 2 diabetes mellitus with hyperglycemia: Secondary | ICD-10-CM | POA: Diagnosis present

## 2017-02-25 DIAGNOSIS — F17210 Nicotine dependence, cigarettes, uncomplicated: Secondary | ICD-10-CM | POA: Diagnosis present

## 2017-02-25 DIAGNOSIS — A419 Sepsis, unspecified organism: Principal | ICD-10-CM | POA: Diagnosis present

## 2017-02-25 DIAGNOSIS — K265 Chronic or unspecified duodenal ulcer with perforation: Secondary | ICD-10-CM | POA: Diagnosis not present

## 2017-02-25 DIAGNOSIS — Z9221 Personal history of antineoplastic chemotherapy: Secondary | ICD-10-CM | POA: Diagnosis not present

## 2017-02-25 DIAGNOSIS — C7931 Secondary malignant neoplasm of brain: Secondary | ICD-10-CM | POA: Diagnosis present

## 2017-02-25 DIAGNOSIS — T451X5A Adverse effect of antineoplastic and immunosuppressive drugs, initial encounter: Secondary | ICD-10-CM | POA: Diagnosis present

## 2017-02-25 DIAGNOSIS — Z22322 Carrier or suspected carrier of Methicillin resistant Staphylococcus aureus: Secondary | ICD-10-CM

## 2017-02-25 DIAGNOSIS — C3490 Malignant neoplasm of unspecified part of unspecified bronchus or lung: Secondary | ICD-10-CM | POA: Diagnosis present

## 2017-02-25 DIAGNOSIS — C7800 Secondary malignant neoplasm of unspecified lung: Secondary | ICD-10-CM | POA: Diagnosis present

## 2017-02-25 DIAGNOSIS — E871 Hypo-osmolality and hyponatremia: Secondary | ICD-10-CM | POA: Diagnosis present

## 2017-02-25 DIAGNOSIS — K5720 Diverticulitis of large intestine with perforation and abscess without bleeding: Secondary | ICD-10-CM | POA: Insufficient documentation

## 2017-02-25 DIAGNOSIS — Z8673 Personal history of transient ischemic attack (TIA), and cerebral infarction without residual deficits: Secondary | ICD-10-CM

## 2017-02-25 DIAGNOSIS — E875 Hyperkalemia: Secondary | ICD-10-CM | POA: Diagnosis present

## 2017-02-25 DIAGNOSIS — F329 Major depressive disorder, single episode, unspecified: Secondary | ICD-10-CM | POA: Diagnosis present

## 2017-02-25 DIAGNOSIS — K255 Chronic or unspecified gastric ulcer with perforation: Secondary | ICD-10-CM | POA: Diagnosis present

## 2017-02-25 DIAGNOSIS — I10 Essential (primary) hypertension: Secondary | ICD-10-CM | POA: Diagnosis present

## 2017-02-25 DIAGNOSIS — Z4659 Encounter for fitting and adjustment of other gastrointestinal appliance and device: Secondary | ICD-10-CM

## 2017-02-25 DIAGNOSIS — Z9889 Other specified postprocedural states: Secondary | ICD-10-CM | POA: Insufficient documentation

## 2017-02-25 DIAGNOSIS — N179 Acute kidney failure, unspecified: Secondary | ICD-10-CM | POA: Diagnosis present

## 2017-02-25 HISTORY — PX: LAPAROTOMY: SHX154

## 2017-02-25 LAB — LACTIC ACID, PLASMA
LACTIC ACID, VENOUS: 1.9 mmol/L (ref 0.5–1.9)
Lactic Acid, Venous: 2.3 mmol/L (ref 0.5–1.9)

## 2017-02-25 LAB — BASIC METABOLIC PANEL
Anion gap: 7 (ref 5–15)
BUN: 39 mg/dL — AB (ref 6–20)
CO2: 25 mmol/L (ref 22–32)
Calcium: 8.1 mg/dL — ABNORMAL LOW (ref 8.9–10.3)
Chloride: 105 mmol/L (ref 101–111)
Creatinine, Ser: 1.31 mg/dL — ABNORMAL HIGH (ref 0.61–1.24)
GFR calc Af Amer: >60 mL/min (ref >60)
GFR calc non Af Amer: 56 mL/min — ABNORMAL LOW (ref >60)
Glucose, Bld: 122 mg/dL — ABNORMAL HIGH (ref 65–99)
POTASSIUM: 4.8 mmol/L (ref 3.5–5.1)
SODIUM: 137 mmol/L (ref 135–145)

## 2017-02-25 LAB — CBC WITH DIFFERENTIAL/PLATELET
BASOS PCT: 0 %
Basophils Absolute: 0 10*3/uL (ref 0.0–0.1)
EOS ABS: 0 10*3/uL (ref 0.0–0.7)
Eosinophils Relative: 0 %
HCT: 48.5 % (ref 39.0–52.0)
Hemoglobin: 16.5 g/dL (ref 13.0–17.0)
Lymphocytes Relative: 4 %
Lymphs Abs: 1.7 10*3/uL (ref 0.7–4.0)
MCH: 29.6 pg (ref 26.0–34.0)
MCHC: 34 g/dL (ref 30.0–36.0)
MCV: 86.9 fL (ref 78.0–100.0)
MONO ABS: 1.3 10*3/uL — AB (ref 0.1–1.0)
Monocytes Relative: 3 %
NEUTROS PCT: 93 %
Neutro Abs: 40.3 10*3/uL — ABNORMAL HIGH (ref 1.7–7.7)
PLATELETS: 173 10*3/uL (ref 150–400)
RBC: 5.58 MIL/uL (ref 4.22–5.81)
RDW: 15.5 % (ref 11.5–15.5)
WBC: 43.3 10*3/uL — AB (ref 4.0–10.5)

## 2017-02-25 LAB — MRSA PCR SCREENING: MRSA BY PCR: POSITIVE — AB

## 2017-02-25 LAB — TYPE AND SCREEN
ABO/RH(D): A POS
ANTIBODY SCREEN: NEGATIVE

## 2017-02-25 LAB — ABO/RH: ABO/RH(D): A POS

## 2017-02-25 SURGERY — LAPAROTOMY, EXPLORATORY
Anesthesia: General | Site: Abdomen

## 2017-02-25 MED ORDER — IPRATROPIUM-ALBUTEROL 0.5-2.5 (3) MG/3ML IN SOLN
3.0000 mL | Freq: Four times a day (QID) | RESPIRATORY_TRACT | Status: DC
  Filled 2017-02-25: qty 3

## 2017-02-25 MED ORDER — PROPOFOL 10 MG/ML IV BOLUS
INTRAVENOUS | Status: DC | PRN
  Administered 2017-02-25: 150 mg via INTRAVENOUS

## 2017-02-25 MED ORDER — ALBUTEROL SULFATE HFA 108 (90 BASE) MCG/ACT IN AERS
INHALATION_SPRAY | RESPIRATORY_TRACT | Status: AC
  Filled 2017-02-25: qty 6.7

## 2017-02-25 MED ORDER — IPRATROPIUM-ALBUTEROL 0.5-2.5 (3) MG/3ML IN SOLN
3.0000 mL | RESPIRATORY_TRACT | Status: AC
  Administered 2017-02-25: 3 mL via RESPIRATORY_TRACT
  Filled 2017-02-25: qty 3

## 2017-02-25 MED ORDER — DEXTROSE 5 % IV SOLN: INTRAVENOUS | Status: DC | PRN

## 2017-02-25 MED ORDER — LACTATED RINGERS IV SOLN
INTRAVENOUS | Status: DC | PRN
  Administered 2017-02-25 (×2): via INTRAVENOUS

## 2017-02-25 MED ORDER — HYDROMORPHONE HCL 1 MG/ML IJ SOLN
INTRAMUSCULAR | Status: AC
  Filled 2017-02-25: qty 1

## 2017-02-25 MED ORDER — PANTOPRAZOLE SODIUM 40 MG IV SOLR
40.0000 mg | Freq: Two times a day (BID) | INTRAVENOUS | Status: DC
  Administered 2017-02-25 – 2017-03-01 (×8): 40 mg via INTRAVENOUS
  Filled 2017-02-25 (×8): qty 40

## 2017-02-25 MED ORDER — PROMETHAZINE HCL 25 MG/ML IJ SOLN: 6.2500 mg | INTRAMUSCULAR | Status: DC | PRN

## 2017-02-25 MED ORDER — ONDANSETRON HCL 4 MG/2ML IJ SOLN
INTRAMUSCULAR | Status: DC | PRN
  Administered 2017-02-25: 4 mg via INTRAVENOUS

## 2017-02-25 MED ORDER — DEXAMETHASONE SODIUM PHOSPHATE 4 MG/ML IJ SOLN
INTRAMUSCULAR | Status: DC | PRN
  Administered 2017-02-25: 4 mg via INTRAVENOUS

## 2017-02-25 MED ORDER — MIRTAZAPINE 15 MG PO TABS
30.0000 mg | ORAL_TABLET | Freq: Every day | ORAL | Status: DC
  Administered 2017-02-28 – 2017-03-02 (×3): 30 mg via ORAL
  Filled 2017-02-25 (×5): qty 2

## 2017-02-25 MED ORDER — ONDANSETRON 4 MG PO TBDP: 4.0000 mg | ORAL_TABLET | Freq: Four times a day (QID) | ORAL | Status: DC | PRN

## 2017-02-25 MED ORDER — MIDAZOLAM HCL 2 MG/2ML IJ SOLN
INTRAMUSCULAR | Status: AC
  Filled 2017-02-25: qty 2

## 2017-02-25 MED ORDER — ONDANSETRON HCL 4 MG/2ML IJ SOLN
INTRAMUSCULAR | Status: AC
  Filled 2017-02-25: qty 2

## 2017-02-25 MED ORDER — SUGAMMADEX SODIUM 200 MG/2ML IV SOLN
INTRAVENOUS | Status: AC
  Filled 2017-02-25: qty 2

## 2017-02-25 MED ORDER — PHENYLEPHRINE HCL 10 MG/ML IJ SOLN
INTRAVENOUS | Status: DC | PRN
  Administered 2017-02-25: 50 ug/min via INTRAVENOUS

## 2017-02-25 MED ORDER — ACETAMINOPHEN 325 MG PO TABS: 650.0000 mg | ORAL_TABLET | Freq: Four times a day (QID) | ORAL | Status: DC | PRN

## 2017-02-25 MED ORDER — SUCCINYLCHOLINE CHLORIDE 20 MG/ML IJ SOLN
INTRAMUSCULAR | Status: DC | PRN
  Administered 2017-02-25: 120 mg via INTRAVENOUS

## 2017-02-25 MED ORDER — OXYCODONE HCL 5 MG PO TABS
5.0000 mg | ORAL_TABLET | ORAL | Status: DC | PRN
  Administered 2017-02-28 – 2017-03-02 (×5): 10 mg via ORAL
  Filled 2017-02-25 (×8): qty 2

## 2017-02-25 MED ORDER — LABETALOL HCL 5 MG/ML IV SOLN
10.0000 mg | INTRAVENOUS | Status: DC | PRN
Start: 2017-02-25 — End: 2017-02-25
  Administered 2017-02-25: 5 mg via INTRAVENOUS

## 2017-02-25 MED ORDER — DEXAMETHASONE SODIUM PHOSPHATE 4 MG/ML IJ SOLN
4.0000 mg | Freq: Three times a day (TID) | INTRAMUSCULAR | Status: DC
  Administered 2017-02-26 – 2017-03-03 (×16): 4 mg via INTRAVENOUS
  Filled 2017-02-25 (×18): qty 1

## 2017-02-25 MED ORDER — DIPHENHYDRAMINE HCL 12.5 MG/5ML PO ELIX: 12.5000 mg | ORAL_SOLUTION | Freq: Four times a day (QID) | ORAL | Status: DC | PRN

## 2017-02-25 MED ORDER — ROCURONIUM BROMIDE 100 MG/10ML IV SOLN
INTRAVENOUS | Status: DC | PRN
  Administered 2017-02-25: 30 mg via INTRAVENOUS
  Administered 2017-02-25: 10 mg via INTRAVENOUS

## 2017-02-25 MED ORDER — FENTANYL CITRATE (PF) 250 MCG/5ML IJ SOLN
INTRAMUSCULAR | Status: AC
  Filled 2017-02-25: qty 5

## 2017-02-25 MED ORDER — SUGAMMADEX SODIUM 200 MG/2ML IV SOLN
INTRAVENOUS | Status: DC | PRN
  Administered 2017-02-25: 200 mg via INTRAVENOUS

## 2017-02-25 MED ORDER — LACTATED RINGERS IV SOLN
INTRAVENOUS | Status: DC | PRN
  Administered 2017-02-25: 16:00:00 via INTRAVENOUS

## 2017-02-25 MED ORDER — LIDOCAINE HCL (CARDIAC) 20 MG/ML IV SOLN
INTRAVENOUS | Status: DC | PRN
  Administered 2017-02-25: 50 mg via INTRAVENOUS

## 2017-02-25 MED ORDER — HYDROMORPHONE HCL 1 MG/ML IJ SOLN: 0.2500 mg | INTRAMUSCULAR | Status: DC | PRN

## 2017-02-25 MED ORDER — PIPERACILLIN-TAZOBACTAM 3.375 G IVPB 30 MIN
3.3750 g | Freq: Three times a day (TID) | INTRAVENOUS | Status: DC
Start: 2017-02-25 — End: 2017-02-26
  Administered 2017-02-25 (×2): 3.375 g via INTRAVENOUS
  Filled 2017-02-25 (×5): qty 50

## 2017-02-25 MED ORDER — FENTANYL CITRATE (PF) 100 MCG/2ML IJ SOLN
INTRAMUSCULAR | Status: AC
  Administered 2017-02-25: 50 ug via INTRAVENOUS
  Filled 2017-02-25: qty 2

## 2017-02-25 MED ORDER — SODIUM CHLORIDE 0.9 % IV SOLN: Freq: Once | INTRAVENOUS | Status: DC

## 2017-02-25 MED ORDER — ACETAMINOPHEN 650 MG RE SUPP: 650.0000 mg | Freq: Four times a day (QID) | RECTAL | Status: DC | PRN

## 2017-02-25 MED ORDER — MIDAZOLAM HCL 5 MG/5ML IJ SOLN
INTRAMUSCULAR | Status: DC | PRN
  Administered 2017-02-25: 1 mg via INTRAVENOUS

## 2017-02-25 MED ORDER — HYDRALAZINE HCL 20 MG/ML IJ SOLN: 10.0000 mg | INTRAMUSCULAR | Status: DC | PRN

## 2017-02-25 MED ORDER — ROCURONIUM BROMIDE 50 MG/5ML IV SOSY
PREFILLED_SYRINGE | INTRAVENOUS | Status: AC
  Filled 2017-02-25: qty 5

## 2017-02-25 MED ORDER — ENOXAPARIN SODIUM 40 MG/0.4ML ~~LOC~~ SOLN
40.0000 mg | SUBCUTANEOUS | Status: DC
  Administered 2017-02-25 – 2017-03-02 (×6): 40 mg via SUBCUTANEOUS
  Filled 2017-02-25 (×6): qty 0.4

## 2017-02-25 MED ORDER — LIDOCAINE 2% (20 MG/ML) 5 ML SYRINGE
INTRAMUSCULAR | Status: AC
  Filled 2017-02-25: qty 5

## 2017-02-25 MED ORDER — FENTANYL CITRATE (PF) 100 MCG/2ML IJ SOLN
25.0000 ug | INTRAMUSCULAR | Status: DC | PRN
  Administered 2017-02-25 (×3): 50 ug via INTRAVENOUS

## 2017-02-25 MED ORDER — DIPHENHYDRAMINE HCL 50 MG/ML IJ SOLN: 12.5000 mg | Freq: Four times a day (QID) | INTRAMUSCULAR | Status: DC | PRN

## 2017-02-25 MED ORDER — METHOCARBAMOL 500 MG PO TABS
500.0000 mg | ORAL_TABLET | Freq: Four times a day (QID) | ORAL | Status: DC | PRN
  Administered 2017-03-01 (×2): 500 mg via ORAL
  Filled 2017-02-25 (×2): qty 1

## 2017-02-25 MED ORDER — SODIUM CHLORIDE 0.9 % IV BOLUS (SEPSIS)
1000.0000 mL | Freq: Once | INTRAVENOUS | Status: AC
  Administered 2017-02-25: 1000 mL via INTRAVENOUS

## 2017-02-25 MED ORDER — DEXAMETHASONE SODIUM PHOSPHATE 10 MG/ML IJ SOLN
INTRAMUSCULAR | Status: AC
  Filled 2017-02-25: qty 1

## 2017-02-25 MED ORDER — FENTANYL CITRATE (PF) 100 MCG/2ML IJ SOLN
INTRAMUSCULAR | Status: DC | PRN
  Administered 2017-02-25 (×3): 50 ug via INTRAVENOUS

## 2017-02-25 MED ORDER — PROPOFOL 10 MG/ML IV BOLUS
INTRAVENOUS | Status: AC
  Filled 2017-02-25: qty 20

## 2017-02-25 MED ORDER — SUCCINYLCHOLINE CHLORIDE 200 MG/10ML IV SOSY
PREFILLED_SYRINGE | INTRAVENOUS | Status: AC
  Filled 2017-02-25: qty 10

## 2017-02-25 MED ORDER — 0.9 % SODIUM CHLORIDE (POUR BTL) OPTIME
TOPICAL | Status: DC | PRN
  Administered 2017-02-25: 3000 mL

## 2017-02-25 MED ORDER — ONDANSETRON HCL 4 MG/2ML IJ SOLN
4.0000 mg | Freq: Four times a day (QID) | INTRAMUSCULAR | Status: DC | PRN
  Administered 2017-02-26: 4 mg via INTRAVENOUS
  Filled 2017-02-25: qty 2

## 2017-02-25 MED ORDER — PHENYLEPHRINE HCL 10 MG/ML IJ SOLN
INTRAMUSCULAR | Status: DC | PRN
  Administered 2017-02-25 (×5): 80 ug via INTRAVENOUS

## 2017-02-25 MED ORDER — LABETALOL HCL 5 MG/ML IV SOLN
INTRAVENOUS | Status: AC
  Filled 2017-02-25: qty 4

## 2017-02-25 MED ORDER — MORPHINE SULFATE (PF) 4 MG/ML IV SOLN
4.0000 mg | INTRAVENOUS | Status: DC | PRN
  Administered 2017-02-25 – 2017-02-28 (×13): 4 mg via INTRAVENOUS
  Filled 2017-02-25 (×14): qty 1

## 2017-02-25 MED ORDER — KCL IN DEXTROSE-NACL 20-5-0.45 MEQ/L-%-% IV SOLN
INTRAVENOUS | Status: DC
  Administered 2017-02-25 – 2017-02-27 (×3): via INTRAVENOUS
  Filled 2017-02-25 (×3): qty 1000

## 2017-02-25 SURGICAL SUPPLY — 43 items
BNDG GAUZE ELAST 4 BULKY (GAUZE/BANDAGES/DRESSINGS) ×2 IMPLANT
CHLORAPREP W/TINT 26ML (MISCELLANEOUS) ×2 IMPLANT
COVER MAYO STAND STRL (DRAPES) ×2 IMPLANT
COVER SURGICAL LIGHT HANDLE (MISCELLANEOUS) ×2 IMPLANT
DRAPE LAPAROSCOPIC ABDOMINAL (DRAPES) ×2 IMPLANT
DRAPE UTILITY XL STRL (DRAPES) ×2 IMPLANT
DRAPE WARM FLUID 44X44 (DRAPE) ×2 IMPLANT
DRSG OPSITE POSTOP 4X10 (GAUZE/BANDAGES/DRESSINGS) IMPLANT
DRSG OPSITE POSTOP 4X6 (GAUZE/BANDAGES/DRESSINGS) IMPLANT
DRSG OPSITE POSTOP 4X8 (GAUZE/BANDAGES/DRESSINGS) IMPLANT
ELECT BLADE 6.5 EXT (BLADE) IMPLANT
ELECT REM PT RETURN 15FT ADLT (MISCELLANEOUS) ×2 IMPLANT
GLOVE BIOGEL PI IND STRL 7.0 (GLOVE) ×1 IMPLANT
GLOVE BIOGEL PI INDICATOR 7.0 (GLOVE) ×1
GLOVE SURG SS PI 7.0 STRL IVOR (GLOVE) ×2 IMPLANT
GOWN STRL REUS W/TWL LRG LVL3 (GOWN DISPOSABLE) ×2 IMPLANT
GOWN STRL REUS W/TWL XL LVL3 (GOWN DISPOSABLE) ×4 IMPLANT
HANDLE SUCTION POOLE (INSTRUMENTS) ×1 IMPLANT
KIT BASIN OR (CUSTOM PROCEDURE TRAY) ×2 IMPLANT
PACK GENERAL/GYN (CUSTOM PROCEDURE TRAY) ×2 IMPLANT
PAD ABD 8X10 STRL (GAUZE/BANDAGES/DRESSINGS) ×2 IMPLANT
RELOAD PROXIMATE 100 BLUE (MISCELLANEOUS) IMPLANT
RELOAD PROXIMATE 100MM BLUE (MISCELLANEOUS)
SEALER TISSUE X1 CVD JAW (INSTRUMENTS) IMPLANT
SPONGE LAP 18X18 X RAY DECT (DISPOSABLE) IMPLANT
STAPLER PROXIMATE 100MM BLUE (MISCELLANEOUS) IMPLANT
STAPLER VISISTAT 35W (STAPLE) ×2 IMPLANT
SUCTION POOLE HANDLE (INSTRUMENTS) ×2
SUT MNCRL AB 4-0 PS2 18 (SUTURE) IMPLANT
SUT PDS AB 0 CT1 36 (SUTURE) IMPLANT
SUT PDS AB 1 CTX 36 (SUTURE) ×4 IMPLANT
SUT SILK 2 0 (SUTURE) ×1
SUT SILK 2 0 SH CR/8 (SUTURE) IMPLANT
SUT SILK 2-0 18XBRD TIE 12 (SUTURE) ×1 IMPLANT
SUT SILK 3 0 (SUTURE) ×1
SUT SILK 3 0 SH CR/8 (SUTURE) ×2 IMPLANT
SUT SILK 3-0 18XBRD TIE 12 (SUTURE) ×1 IMPLANT
TAPE CLOTH SURG 6X10 WHT LF (GAUZE/BANDAGES/DRESSINGS) ×2 IMPLANT
TOWEL OR 17X26 10 PK STRL BLUE (TOWEL DISPOSABLE) ×2 IMPLANT
TOWEL OR NON WOVEN STRL DISP B (DISPOSABLE) ×4 IMPLANT
TRAY FOLEY W/METER SILVER 14FR (SET/KITS/TRAYS/PACK) IMPLANT
TRAY FOLEY W/METER SILVER 16FR (SET/KITS/TRAYS/PACK) ×2 IMPLANT
YANKAUER SUCT BULB TIP NO VENT (SUCTIONS) ×2 IMPLANT

## 2017-02-25 NOTE — ED Provider Notes (Signed)
MSE was initiated and I personally evaluated the patient and placed orders (if any) at  12:45 PM on Feb 25, 2017.  The patient appears stable so that the remainder of the MSE may be completed by another provider.  Sent from [facility] hospital to see general surgery. Has cancer on chemotherapy with a bowel perforation on CT. To see Dr. Buckner Malta, MD 02/25/17 [phone]

## 2017-02-25 NOTE — ED Notes (Signed)
Bed: WA08 Expected date:  Expected time:  Means of arrival:  Comments: 

## 2017-02-25 NOTE — H&P (Signed)
[facility] Surgery Consult/Admission Note  [name] [name] 03/15/1951  [id number].    Requesting MD: MD at [facility] hospital ED Chief Complaint/Reason for Consult: abdominal pain and free air on CT  HPI:   Pt is a 66 year old male with a history of HTN, stage 4  non-small cell lung cancer, squamous cell carcinoma started chemo last week and is followed up Dr. Inda [name], oncologist, hx of abnormal MRI of brain and possible embolic strokes seen by neurology 2 days ago (5/9), who presented to an ED in [place], [place] where the pt lives with complaints of abdominal pain. Pt states he started having abdominal pain last night that progressively worsened to severe, non radiating, in his lower abdomen, pain medication received in the ED made it better. Associated fevers. Pt states he has intermitted bleeding from his rectum but last night it was much worse. He states his last BM was a few days ago and was normal. He denies constipation, nausea, vomiting, LOC, dizzines, CP, SOB.   ED Course: Ct scan abdomen revealed free air   ROS:  Review of Systems  Constitutional: Positive for fever. Negative for diaphoresis.  HENT: Negative for sore throat.   Eyes: Negative for pain.  Respiratory: Positive for cough and sputum production. Negative for shortness of breath and wheezing.   Cardiovascular: Negative for chest pain.  Gastrointestinal: Positive for abdominal pain and blood in stool. Negative for constipation, diarrhea, nausea and vomiting.  Genitourinary: Negative for dysuria and hematuria.  Neurological: Negative for dizziness, loss of consciousness, weakness and headaches.  All other systems reviewed and are negative.    Family History  Problem Relation Age of Onset  . Cancer Father        unsure of what type    Past Medical History:  Diagnosis Date  . Brain metastases ([place]) 02/15/2017  . Depression 01/29/2017  . Dyspnea   . Encounter for antineoplastic chemotherapy 01/29/2017  . Goals of care,  counseling/discussion 01/29/2017  . Hypertension   . Metastatic squamous cell carcinoma to lung, unspecified laterality ([place]) 01/28/2017    Past Surgical History:  Procedure Laterality Date  . BACK SURGERY     cyst removal  . COLONOSCOPY    . MINOR PLACEMENT OF FIDUCIAL N/A 01/19/2017   Procedure: MINOR PLACEMENT OF FIDUCIAL x 6;  Surgeon: [name] Gobble, MD;  Location: [place];  Service: Thoracic;  Laterality: N/A;  . VIDEO BRONCHOSCOPY WITH ENDOBRONCHIAL NAVIGATION N/A 01/19/2017   Procedure: VIDEO BRONCHOSCOPY WITH ENDOBRONCHIAL NAVIGATION;  Surgeon: [name] Gobble, MD;  Location: [place];  Service: Thoracic;  Laterality: N/A;    Social History:  reports that he has been smoking Cigarettes.  He has a 45.00 pack-year smoking history. He has never used smokeless tobacco. He reports that he does not drink alcohol or use drugs.  Allergies:  Allergies  Allergen Reactions  . No Known Allergies      (Not in a hospital admission)  Blood pressure (!) 126/92, pulse (!) 102, temperature 97.6 F (36.4 C), temperature source Oral, resp. rate 20, SpO2 93 %.  Physical Exam  Constitutional: He is oriented to person, place, and time and well-developed, well-nourished, and in no distress. No distress.  White male, appears uncomfortable  HENT:  Head: Normocephalic and atraumatic.  Right Ear: External ear normal.  Left Ear: External ear normal.  Nose: Nose normal.  Mouth/Throat: Uvula is midline and mucous membranes are normal. Oropharyngeal exudate present. No posterior oropharyngeal edema.  Eyes: Lids are normal.  Pupils are equal, round, and reactive to light. Right conjunctiva is injected. Left conjunctiva is injected.  Neck: Trachea normal and normal range of motion. No thyromegaly present.  Cardiovascular: Regular rhythm and normal heart sounds.  Tachycardia present.   Pulses:      Radial pulses are 2+ on the right side, and 2+ on the left side.       Posterior tibial pulses are 2+ on the right  side, and 2+ on the left side.  Pulmonary/Chest: Effort normal. No accessory muscle usage. Tachypnea noted. He has no decreased breath sounds. He has wheezes.  Scattered expiratory wheezes  Abdominal: Soft. Normal appearance and bowel sounds are normal. He exhibits no distension. There is no hepatosplenomegaly. There is tenderness in the right lower quadrant, suprapubic area and left lower quadrant.  Musculoskeletal: Normal range of motion. He exhibits no edema, tenderness or deformity.  Neurological: He is alert and oriented to person, place, and time. No cranial nerve deficit (grossly normal).  Skin: Skin is warm and dry. He is not diaphoretic.  Psychiatric: Mood and affect normal.    No results found for this or any previous visit (from the past 48 hour(s)). No results found.    Assessment/Plan  Active Problems:   Metastatic squamous cell carcinoma to lung, unspecified laterality (HCC)   Diverticulitis of colon with perforation  Admit to [facility] service - pt will go to OR for exploratory laparotomy today 5/11, for possible colectomy 2/2 diverticulitis with perforation  - Have asked medicine to consult to help manage pt's cancer and stroke, we appreciate their help with this pt  FEN: NPO ID: [email or web address]  Plan: OR for ex lap  [name] [name], [facility] Surgery 02/25/2017, 2:21 PM Pager: [phone] Consults: [phone] Mon-Fri 7:00 am-4:30 pm Sat-Sun 7:00 am-11:30 am

## 2017-02-25 NOTE — Op Note (Signed)
Preoperative diagnosis: perforated viscous  Postoperative diagnosis: perforated pyloric channel ulcer  Procedure: modified graham's patch  Surgeon: Gurney Maxin, M.D.  Asst: Jackson Latino, P.A.  Anesthesia: general  Indications for procedure: [name] [name] is a 66 y.o. year old male with symptoms of abdominal pain. The patient has a history significant for lung cancer and recent stroke. On workup he showed a leukemoid reaction with free air on CT scan. Initially, this appeared most concerning for diverticular disease therefore he was taken to the operating room emergently for exploration.  Description of procedure: The patient was brought into the operative suite. Anesthesia was administered with General endotracheal anesthesia. WHO checklist was applied. The patient was then placed in supine position. The area was prepped and draped in the usual sterile fashion.  Next a midline incision was made and cautery was used to dissect down through subcutaneous anus tissues. Fascia was incised. Upon entry into the peritoneal cavity then murky orange fluid was encountered this was suctioned out. Looking at the: There is no sign of perforation within the sigmoid and it was relatively free of disease. Turned our attention towards the upper abdomen there is a large amount of murky fluid coming from around the gallbladder and stomach. On further examination there was a 3 mm perforation of the anterior first portion of the duodenum consistent with pyloric channel ulcer perforation. Therefore, decision was made to perform modified Graham's patch.  3-0 silk was used in interrupted fashion to oppose the duodenal wall at the site of the ulcer leaving the tails attached. Next, tongue of omentum was created using cautery. The tongue of omentum was placed across the 3-0 silk sutures and then tied in place over the same sutures. The tongue of omentum appeared and a sturdy position directly overlying the ulcer in this  fashion. Next the abdomen was irrigated with a large amount of saline.  The abdomen was then closed with a #1 single-stranded PDS in running fashion. The skin was left open and packed with saline and a gauze due to the number of comorbidities including recently undergoing chemotherapy, tobacco use, and atherosclerosis. Patient awoke from anesthesia was brought to PACU in stable condition.  Findings: Perforated pyloric channel ulcer  Specimen: none  Implant: none   Blood loss: <30m  Local anesthesia: none  Complications: none  LGurney Maxin M.D. General, Bariatric, & Minimally Invasive Surgery [facility]

## 2017-02-25 NOTE — ED Notes (Signed)
Surgery at bedside.

## 2017-02-25 NOTE — Anesthesia Postprocedure Evaluation (Signed)
Anesthesia Post Note  Patient: [name] [name]  Procedure(s) Performed: Procedure(s) (LRB): EXPLORATORY LAPAROTOMY MODIFIED GRAHAM'S PATCH (N/A)  Patient location during evaluation: PACU Anesthesia Type: General Level of consciousness: awake and alert Pain management: pain level controlled Vital Signs Assessment: post-procedure vital signs reviewed and stable Respiratory status: spontaneous breathing, nonlabored ventilation, respiratory function stable and patient connected to nasal cannula oxygen Cardiovascular status: blood pressure returned to baseline and stable Postop Assessment: no signs of nausea or vomiting Anesthetic complications: no       Last Vitals:  Vitals:   02/25/17 1811 02/25/17 1828  BP: (P) 134/78 (!) 167/75  Pulse: (P) 94 95  Resp: (P) 20 (!) 27  Temp: 36.8 C 36.9 C    Last Pain:  Vitals:   02/25/17 1800  TempSrc:   PainSc: 3                  Tiajuana Amass

## 2017-02-25 NOTE — Telephone Encounter (Signed)
This RN in Triage received call from pt's significant other - Charlotte Crumb, who states pt is at the local hospital in [place] [facility] ) " having terrible pain and bleeding ".  Volney Presser stated concern due to above " and they do not have a cancer doctor here and I need Dr Earlie Server to call them and tell them what to do ".  This RN validated Frieda's concerns but also informed her pt does not need an oncologist presently but emergency care. Informed her Dr MM cannot call outside hospitals to direct care but care needs and can be provided by ER MD's. Reassurance given per pt being at the local hospital with his current issues.  Note pt is having bleeding " either from his stool or urine because the toilet was full of blood " , " he is in terrible pain holding his abdomen and just praying out to God - he is in so much pain ".  Again reassurance given per above care as well as Dr MM will be notified of above situation.  This RN informed RN who is working with MD today.

## 2017-02-25 NOTE — Anesthesia Procedure Notes (Signed)
Procedure Name: Intubation Date/Time: 02/25/2017 3:36 PM Performed by: Glory Buff Pre-anesthesia Checklist: Patient identified, Emergency Drugs available, Suction available and Patient being monitored Patient Re-evaluated:Patient Re-evaluated prior to inductionOxygen Delivery Method: Circle system utilized Preoxygenation: Pre-oxygenation with 100% oxygen Intubation Type: IV induction Ventilation: Mask ventilation without difficulty Laryngoscope Size: Miller and 3 Grade View: Grade I Tube type: Oral Tube size: 7.5 mm Number of attempts: 1 Airway Equipment and Method: Stylet and Oral airway Placement Confirmation: ETT inserted through vocal cords under direct vision,  positive ETCO2 and breath sounds checked- equal and bilateral Secured at: 21 cm Tube secured with: Tape Dental Injury: Teeth and Oropharynx as per pre-operative assessment

## 2017-02-25 NOTE — ED Notes (Signed)
Family at bedside. 

## 2017-02-25 NOTE — Consult Note (Addendum)
[facility] Medical Consultation  [name] [name] KGU:[id number] DOB: Nov 07, 1950 DOA: 02/25/2017 PCP: [name] Chroman, MD   Requesting physician: [name] [name] Date of consultation: 02/25/2017 Reason for consultation: management of chronic medical conditions   Impression/Recommendations Active Problems:   Metastatic squamous cell carcinoma to lung, unspecified laterality ([name]) - With recent MRI brain, worrisome for brain metastases - Patient is on Decadron at home, will continue here - We'll notify [name] [name] of patient's admission    Sepsis secondary to Diverticulitis of colon with perforation, Perforated pyloric channel ulcer - Taken to OR for ex lap, modified graham's patch, stable postop - Agree with Zosyn, continue monitoring in step down unit - CBC in the morning    Acute kidney injury - Secondary to the above - Keep on IV fluids, BMP in the morning    HTN  - hold lisinopril and HCTZ that pt takes at home  - place on IV Hydralazine as needed for now      Chief Complaint: abd pain   HPI:  Patient is 66 year old male with known hypertension, stage IV non-small cell lung cancer, recently started chemotherapy, follows with [name], presented to [name] emergency department with several days duration of progressively worsening epigastric pain that has gotten much worse 1 day prior to this admission. Pain was initially intermittent but in the past 24 hours has become constant, sharp, severe, 10/10 in severity, worse with any oral intake liquids or solids, worse with even minimal movement and deep breaths, no specific alleviating factors, associated with nausea, subjective fevers and chills. Patient denies similar episodes in the past. Patient denies chest pain and shortness of breath.  Review of Systems:  Constitutional: Negative for fever, chills, diaphoresis HENT: Negative for ear pain, nosebleeds, congestion, facial swelling, rhinorrhea, neck pain, neck stiffness and ear  discharge.   Eyes: Negative for pain, discharge, redness, itching and visual disturbance.  Respiratory: Negative for cough, choking, chest tightness, shortness of breath, wheezing and stridor.   Cardiovascular: Negative for chest pain, palpitations and leg swelling.  Gastrointestinal: Per history of present illness Genitourinary: Negative for dysuria, urgency, frequency, hematuria Musculoskeletal: Negative for back pain, joint swelling, arthralgias and gait problem.  Neurological: Negative for dizziness, tremors, seizures, syncope, facial asymmetry Hematological: Negative for adenopathy. Does not bruise/bleed easily.  Psychiatric/Behavioral: Negative for hallucinations, behavioral problems, confusion, dysphoric mood, decreased concentration and agitation.     Past Medical History:  Diagnosis Date  . Brain metastases ([place]) 02/15/2017  . Depression 01/29/2017  . Dyspnea   . Encounter for antineoplastic chemotherapy 01/29/2017  . Goals of care, counseling/discussion 01/29/2017  . Hypertension   . Metastatic squamous cell carcinoma to lung, unspecified laterality ([place]) 01/28/2017   Past Surgical History:  Procedure Laterality Date  . BACK SURGERY     cyst removal  . COLONOSCOPY    . MINOR PLACEMENT OF FIDUCIAL N/A 01/19/2017   Procedure: MINOR PLACEMENT OF FIDUCIAL x 6;  Surgeon: [name] Gobble, MD;  Location: [name];  Service: Thoracic;  Laterality: N/A;  . VIDEO BRONCHOSCOPY WITH ENDOBRONCHIAL NAVIGATION N/A 01/19/2017   Procedure: VIDEO BRONCHOSCOPY WITH ENDOBRONCHIAL NAVIGATION;  Surgeon: [name] Gobble, MD;  Location: [place];  Service: Thoracic;  Laterality: N/A;   Social History:  reports that he has been smoking Cigarettes.  He has a 45.00 pack-year smoking history. He has never used smokeless tobacco. He reports that he does not drink alcohol or use drugs.  Allergies  Allergen Reactions  . No Known Allergies  Family History  Problem Relation Age of Onset  . Cancer Father         unsure of what type    Prior to Admission medications   Medication Sig Start Date End Date Taking? Authorizing Provider  acetaminophen (TYLENOL) 325 MG tablet Take 325-650 mg by mouth every 6 (six) hours as needed (for aches/pains).    [provider]  aspirin 81 MG chewable tablet Chew 81 mg by mouth daily.    [provider]  dexamethasone (DECADRON) 4 MG tablet Take 1 tablet (4 mg total) by mouth 3 (three) times daily. 02/09/17   [name] Bears, MD  hydrochlorothiazide (HYDRODIURIL) 25 MG tablet Take 25 mg by mouth daily.    [provider]  lisinopril (PRINIVIL,ZESTRIL) 40 MG tablet Take 40 mg by mouth daily.    [provider]  mirtazapine (REMERON) 30 MG tablet Take 1 tablet (30 mg total) by mouth at bedtime. 01/28/17   [name] Bears, MD  nicotine (NICODERM CQ - DOSED IN MG/24 HOURS) 21 mg/24hr patch Place 1 patch (21 mg total) onto the skin daily. Patient not taking: Reported on 02/23/2017 01/03/17   [name] Garfinkel, MD  oxyCODONE-acetaminophen (PERCOCET/ROXICET) 5-325 MG tablet Take 1 tablet by mouth every 6 (six) hours as needed for severe pain. 02/15/17   [name] Bears, MD  prochlorperazine (COMPAZINE) 10 MG tablet Take 1 tablet (10 mg total) by mouth every 6 (six) hours as needed for nausea or vomiting. 01/28/17   [name] Bears, MD   Physical Exam: [name] pressure 121/79, pulse 92, temperature 98.3 F (36.8 C), resp. rate 16, SpO2 92 %. Vitals:   02/25/17 1800 02/25/17 1811  BP: 121/79   Pulse: 92   Resp: 16   Temp:  98.3 F (36.8 C)   Physical Exam  Constitutional: Appears stable post op HENT: Normocephalic. External right and left ear normal. Oropharynx is clear and moist.  Eyes: Conjunctivae and EOM are normal. PERRLA, no scleral icterus.  Neck: Normal ROM. Neck supple. No JVD. No tracheal deviation. No thyromegaly.  CVS: RRR, S1/S2 +, no murmurs, no gallops, no carotid bruit.  Pulmonary: Effort and breath sounds normal, diminished  breath sounds at bases  Abdominal: Soft. Pt rather sore so detailed exam deferred per pt's request  Musculoskeletal:  No edema and no tenderness. Full exam deferred due to pt just arriving after surgery  Lymphadenopathy: No lymphadenopathy noted, cervical, inguinal. Neuro: Alert. Normal reflexes, muscle tone coordination. No cranial nerve deficit. Skin: Skin is warm and dry.  Psychiatric: Normal mood and affect.   Labs on Admission:  Basic Metabolic Panel:  Recent Labs Lab 02/22/17 1039 02/25/17 1416  NA 135* 137  K 4.7 4.8  CL  --  105  CO2 27 25  GLUCOSE 127 122*  BUN 28.5* 39*  CREATININE 0.9 1.31*  CALCIUM 9.1 8.1*   Liver Function Tests:  Recent Labs Lab 02/22/17 1039  AST 12  ALT 23  ALKPHOS 78  BILITOT 0.57  PROT 6.8  ALBUMIN 3.7   CBC:  Recent Labs Lab 02/22/17 1039 02/25/17 1416  WBC 17.6* 43.3*  NEUTROABS 15.4* 40.3*  HGB 15.1 16.5  HCT 45.5 48.5  MCV 86.7 86.9  PLT 200 173   Radiological Exams on Admission: No results found.  EKG: Pending  Time spent: 60 minutes  Suleyman Ehrman Magick-Gerhardt Gleed [facility] Pager [phone]  If 7PM-7AM, please contact night-coverage [email or web address] Password [facility] 02/25/2017, 6:27 PM

## 2017-02-25 NOTE — Transfer of Care (Signed)
Immediate Anesthesia Transfer of Care Note  Patient: [name] [name]  Procedure(s) Performed: Procedure(s): EXPLORATORY LAPAROTOMY MODIFIED GRAHAM'S PATCH (N/A)  Patient Location: PACU  Anesthesia Type:General  Level of Consciousness:  sedated, patient cooperative and responds to stimulation  Airway & Oxygen Therapy:Patient Spontanous Breathing and Patient connected to face mask oxgen  Post-op Assessment:  Report given to PACU RN and Post -op Vital signs reviewed and stable  Post vital signs:  Reviewed and stable  Last Vitals:  Vitals:   02/25/17 1400 02/25/17 1435  BP: (!) 126/92 (!) 145/92  Pulse: (!) 102 (!) 111  Resp: 20 (!) 22  Temp:      Complications: No apparent anesthesia complications

## 2017-02-25 NOTE — ED Triage Notes (Signed)
Per EMS, patient transferred from [facility], with perforated bowel. Hx lung cancer. '3mg'$  Dilaudid, Flagyl, Zosyn and 69mg Fentanyl PTA.  18g R wrist

## 2017-02-25 NOTE — Anesthesia Preprocedure Evaluation (Addendum)
Anesthesia Evaluation  Patient identified by MRN, date of birth, ID band Patient awake    Reviewed: Allergy & Precautions, NPO status , Patient's Chart, lab work & pertinent test results  Airway Mallampati: II  TM Distance: >3 FB Neck ROM: Full    Dental  (+) Dental Advisory Given   Pulmonary Current Smoker,  Metastatic ca to lung   breath sounds clear to auscultation       Cardiovascular hypertension, Pt. on medications + angina with exertion  Rhythm:Regular Rate:Normal     Neuro/Psych Depression negative neurological ROS     GI/Hepatic Neg liver ROS, Bowel perforation.   Endo/Other  negative endocrine ROS  Renal/GU negative Renal ROS     Musculoskeletal   Abdominal   Peds  Hematology negative hematology ROS (+)   Anesthesia Other Findings   Reproductive/Obstetrics                            Lab Results  Component Value Date   WBC 17.6 (H) 02/22/2017   HGB 15.1 02/22/2017   HCT 45.5 02/22/2017   MCV 86.7 02/22/2017   PLT 200 02/22/2017   Lab Results  Component Value Date   CREATININE 0.9 02/22/2017   BUN 28.5 (H) 02/22/2017   NA 135 (L) 02/22/2017   K 4.7 02/22/2017   CL 104 01/19/2017   CO2 27 02/22/2017    Anesthesia Physical Anesthesia Plan  ASA: IV and emergent  Anesthesia Plan: General   Post-op Pain Management:    Induction: Intravenous and Rapid sequence  Airway Management Planned: Oral ETT  Additional Equipment:   Intra-op Plan:   Post-operative Plan: Extubation in OR and Possible Post-op intubation/ventilation  Informed Consent: I have reviewed the patients History and Physical, chart, labs and discussed the procedure including the risks, benefits and alternatives for the proposed anesthesia with the patient or authorized representative who has indicated his/her understanding and acceptance.   Dental advisory given  Plan Discussed with:  CRNA  Anesthesia Plan Comments:        Anesthesia Quick Evaluation

## 2017-02-26 ENCOUNTER — Encounter (HOSPITAL_COMMUNITY): Payer: Self-pay | Admitting: General Surgery

## 2017-02-26 DIAGNOSIS — K265 Chronic or unspecified duodenal ulcer with perforation: Secondary | ICD-10-CM

## 2017-02-26 LAB — BASIC METABOLIC PANEL
Anion gap: 7 (ref 5–15)
BUN: 33 mg/dL — AB (ref 6–20)
CO2: 26 mmol/L (ref 22–32)
Calcium: 8 mg/dL — ABNORMAL LOW (ref 8.9–10.3)
Chloride: 101 mmol/L (ref 101–111)
Creatinine, Ser: 0.93 mg/dL (ref 0.61–1.24)
GFR calc Af Amer: >60 mL/min (ref >60)
GFR calc non Af Amer: >60 mL/min (ref >60)
Glucose, Bld: 141 mg/dL — ABNORMAL HIGH (ref 65–99)
POTASSIUM: 5.6 mmol/L — AB (ref 3.5–5.1)
Sodium: 134 mmol/L — ABNORMAL LOW (ref 135–145)

## 2017-02-26 LAB — CBC
HEMATOCRIT: 40.9 % (ref 39.0–52.0)
Hemoglobin: 13.8 g/dL (ref 13.0–17.0)
MCH: 29.3 pg (ref 26.0–34.0)
MCHC: 33.7 g/dL (ref 30.0–36.0)
MCV: 86.8 fL (ref 78.0–100.0)
Platelets: 217 10*3/uL (ref 150–400)
RBC: 4.71 MIL/uL (ref 4.22–5.81)
RDW: 15.5 % (ref 11.5–15.5)
WBC: 31.8 10*3/uL — ABNORMAL HIGH (ref 4.0–10.5)

## 2017-02-26 LAB — HIV ANTIBODY (ROUTINE TESTING W REFLEX): HIV Screen 4th Generation wRfx: NONREACTIVE

## 2017-02-26 MED ORDER — PIPERACILLIN-TAZOBACTAM 3.375 G IVPB
3.3750 g | Freq: Three times a day (TID) | INTRAVENOUS | Status: DC
  Administered 2017-02-26 – 2017-03-03 (×17): 3.375 g via INTRAVENOUS
  Filled 2017-02-26 (×20): qty 50

## 2017-02-26 NOTE — Progress Notes (Signed)
Assessment Principal Problem:   Perforated duodenal ulcer ([place]) s/p exploratory laparotomy and placement of Graham patch 02/25/17-stable overnight Active Problems:   Metastatic squamous cell carcinoma to lung, unspecified laterality ([place])   Plan:  Transfer to floor.  Continue q12h Protonix.  Continue IV Zosyn.  UGI 5/14.   LOS: 1 day     1 Day Post-Op  Chief Complaint/Subjective: Comfortable up in chair.  Objective: Vital signs in last 24 hours: Temp:  [97.6 F (36.4 C)-98.6 F (37 C)] 98.6 F (37 C) (05/12 0800) Pulse Rate:  [84-114] 90 (05/12 0700) Resp:  [11-27] 18 (05/12 0700) BP: (118-176)/(63-108) 160/97 (05/12 0700) SpO2:  [90 %-98 %] 92 % (05/12 0700) Weight:  [100.3 kg (221 lb 1.9 oz)] 100.3 kg (221 lb 1.9 oz) (05/12 0200)    Intake/Output from previous day: 05/11 0701 - 05/12 0700 In: 2697.5 [I.V.:2637.5; IV Piggyback:50] Out: 1630 [Urine:1605; Blood:25] Intake/Output this shift: No intake/output data recorded.  PE: General- In NAD.  Awake and alert. Abdomen-firm and distended, quiet, dressing dry  Lab Results:   Recent Labs  02/25/17 1416 02/26/17 0350  WBC 43.3* 31.8*  HGB 16.5 13.8  HCT 48.5 40.9  PLT 173 217   BMET  Recent Labs  02/25/17 1416 02/26/17 0350  NA 137 134*  K 4.8 5.6*  CL 105 101  CO2 25 26  GLUCOSE 122* 141*  BUN 39* 33*  CREATININE 1.31* 0.93  CALCIUM 8.1* 8.0*   PT/INR No results for input(s): LABPROT, INR in the last 72 hours. Comprehensive Metabolic Panel:    Component Value Date/Time   NA 134 (L) 02/26/2017 0350   NA 137 02/25/2017 1416   NA 135 (L) 02/22/2017 1039   NA 135 (L) 02/15/2017 1116   K 5.6 (H) 02/26/2017 0350   K 4.8 02/25/2017 1416   K 4.7 02/22/2017 1039   K 4.6 02/15/2017 1116   CL 101 02/26/2017 0350   CL 105 02/25/2017 1416   CO2 26 02/26/2017 0350   CO2 25 02/25/2017 1416   CO2 27 02/22/2017 1039   CO2 28 02/15/2017 1116   BUN 33 (H) 02/26/2017 0350   BUN 39 (H) 02/25/2017 1416   BUN 28.5 (H) 02/22/2017 1039   BUN 29.5 (H) 02/15/2017 1116   CREATININE 0.93 02/26/2017 0350   CREATININE 1.31 (H) 02/25/2017 1416   CREATININE 0.9 02/22/2017 1039   CREATININE 1.0 02/15/2017 1116   GLUCOSE 141 (H) 02/26/2017 0350   GLUCOSE 122 (H) 02/25/2017 1416   GLUCOSE 127 02/22/2017 1039   GLUCOSE 101 02/15/2017 1116   CALCIUM 8.0 (L) 02/26/2017 0350   CALCIUM 8.1 (L) 02/25/2017 1416   CALCIUM 9.1 02/22/2017 1039   CALCIUM 9.8 02/15/2017 1116   AST 12 02/22/2017 1039   AST 11 02/15/2017 1116   ALT 23 02/22/2017 1039   ALT 19 02/15/2017 1116   ALKPHOS 78 02/22/2017 1039   ALKPHOS 86 02/15/2017 1116   BILITOT 0.57 02/22/2017 1039   BILITOT 0.64 02/15/2017 1116   PROT 6.8 02/22/2017 1039   PROT 7.1 02/15/2017 1116   ALBUMIN 3.7 02/22/2017 1039   ALBUMIN 4.0 02/15/2017 1116     Studies/Results: No results found.  Anti-infectives: Anti-infectives    Start     Dose/Rate Route Frequency Ordered Stop   02/26/17 0615  piperacillin-tazobactam (ZOSYN) IVPB 3.375 g     3.375 g 12.5 mL/hr over 240 Minutes Intravenous Every 8 hours 02/26/17 0606     02/25/17 1415  piperacillin-tazobactam (ZOSYN) IVPB 3.375 g  Status:  Discontinued     3.375 g 12.5 mL/hr over 240 Minutes Intravenous Every 8 hours 02/25/17 1400 02/26/17 0605       [name] [name] J 02/26/2017

## 2017-02-26 NOTE — Progress Notes (Signed)
Patient pulled NG tube out. M.D. oncall notified. Ordered to put a new one. RN put new NG tube. Confirmed with Auscaltation and back flow of Gastric content and confirmed with another RN,.Will continue to monitor.

## 2017-02-26 NOTE — Progress Notes (Signed)
Consult NOTE    [name] [name]  ZOX:[id number] DOB: 1950-10-20 DOA: 02/25/2017 PCP: [name], MD  Outpatient Specialists:     Brief Narrative:  89  htn Prior active smioker stg IV nscls + brain mets/embolic cva  Suppsoed to get echo/CT neck  Admitted by gen surgery for perf viscus Had modified grham patch placed 02/25/17   Assessment & Plan:   Active Problems:   Metastatic squamous cell carcinoma to lung, unspecified laterality ([facility])   Diverticulitis of colon with perforation   Perforated duodenal ulcer ([place])   H/O exploratory laparotomy   perf abd viscus Diet-as npo currently on d5/o.45 75 cc/h with 20 kcl Pain meds Antibiotics and duration Per gen surgery  htn When can take PO, transition to home med of hctz 25, lisinopril 40 For now hydralazine 10 q2 prn high bp  Mild hyperkalemia-re-check in am  aki Bun creat on admit 39/1.3 cont ivf Labs am  Recent diagnosis stage IV ca with mets/infracts Resume asa 81 when ok by surgery Interval MRI with nuero needed in mid July Will probably need weaning off of steroids going forward.  However with brain mets would continue decadron for the present time      lovenox Inpatient Full code   Consultants:   none  Procedures:   Phillip Heal patch 5/11 for perf viscus  Antimicrobials:   Zosyn 5/12    Subjective:  Pleasant alert oriented Has NG Asking about diet No cp No abd pain out of ordinary Surgery has seen this am Wants to walk  Objective: Vitals:   02/26/17 0400 02/26/17 0500 02/26/17 0600 02/26/17 0700  BP: 133/71 134/65 129/74 (!) 160/97  Pulse: 84 86 85 90  Resp: '12 13 15 18  '$ Temp:      TempSrc:      SpO2: 92% 91% 92% 92%  Weight:      Height:        Intake/Output Summary (Last 24 hours) at 02/26/17 0752 Last data filed at 02/26/17 0600  Gross per 24 hour  Intake           2697.5 ml  Output             1630 ml  Net           1067.5 ml   Filed Weights   02/26/17 0200  Weight:  100.3 kg (221 lb 1.9 oz)    Examination:     Data Reviewed: I have personally reviewed following labs and imaging studies  CBC:  Recent Labs Lab 02/22/17 1039 02/25/17 1416 02/26/17 0350  WBC 17.6* 43.3* 31.8*  NEUTROABS 15.4* 40.3*  --   HGB 15.1 16.5 13.8  HCT 45.5 48.5 40.9  MCV 86.7 86.9 86.8  PLT 200 173 811   Basic Metabolic Panel:  Recent Labs Lab 02/22/17 1039 02/25/17 1416 02/26/17 0350  NA 135* 137 134*  K 4.7 4.8 5.6*  CL  --  105 101  CO2 '27 25 26  '$ GLUCOSE 127 122* 141*  BUN 28.5* 39* 33*  CREATININE 0.9 1.31* 0.93  CALCIUM 9.1 8.1* 8.0*   GFR: Estimated Creatinine Clearance: 93 mL/min (by C-G formula based on SCr of 0.93 mg/dL). Liver Function Tests:  Recent Labs Lab 02/22/17 1039  AST 12  ALT 23  ALKPHOS 78  BILITOT 0.57  PROT 6.8  ALBUMIN 3.7   No results for input(s): LIPASE, AMYLASE in the last 168 hours. No results for input(s): AMMONIA in the last 168 hours. Coagulation Profile:  No results for input(s): INR, PROTIME in the last 168 hours. Cardiac Enzymes: No results for input(s): CKTOTAL, CKMB, CKMBINDEX, TROPONINI in the last 168 hours. BNP (last 3 results) No results for input(s): PROBNP in the last 8760 hours. HbA1C:  Recent Labs  02/23/17 1254  HGBA1C 6.4*   CBG: No results for input(s): GLUCAP in the last 168 hours. Lipid Profile:  Recent Labs  02/23/17 1254  CHOL 155  HDL 55  LDLCALC 82  TRIG 90  CHOLHDL 2.8   Thyroid Function Tests: No results for input(s): TSH, T4TOTAL, FREET4, T3FREE, THYROIDAB in the last 72 hours. Anemia Panel: No results for input(s): VITAMINB12, FOLATE, FERRITIN, TIBC, IRON, RETICCTPCT in the last 72 hours. Urine analysis: No results found for: COLORURINE, APPEARANCEUR, LABSPEC, [place], GLUCOSEU, HGBUR, BILIRUBINUR, KETONESUR, PROTEINUR, UROBILINOGEN, NITRITE, LEUKOCYTESUR Sepsis Labs: '@LABRCNTIP'$ (procalcitonin:4,lacticidven:4)  ) Recent Results (from the past 240 hour(s))    MRSA PCR Screening     Status: Abnormal   Collection Time: 02/25/17  6:41 PM  Result Value Ref Range Status   MRSA by PCR POSITIVE (A) NEGATIVE Final    Comment:        The GeneXpert MRSA Assay (FDA approved for NASAL specimens only), is one component of a comprehensive MRSA colonization surveillance program. It is not intended to diagnose MRSA infection nor to guide or monitor treatment for MRSA infections. RESULT CALLED TO, READ BACK BY AND VERIFIED WITHSilvio Clayman RN [phone] 02/25/17 [facility]          Radiology Studies: No results found.      Scheduled Meds: . dexamethasone  4 mg Intravenous Q8H  . enoxaparin (LOVENOX) injection  40 mg Subcutaneous Q24H  . mirtazapine  30 mg Oral QHS  . pantoprazole (PROTONIX) IV  40 mg Intravenous Q12H   Continuous Infusions: . dextrose 5 % and 0.45 % NaCl with KCl 20 mEq/L 75 mL/hr at 02/26/17 0000  . piperacillin-tazobactam (ZOSYN)  IV 3.375 g (02/26/17 0626)     LOS: 1 day    Time spent: [place], MD [facility]   If 7PM-7AM, please contact night-coverage [email or web address] Password [facility] 02/26/2017, 7:52 AM

## 2017-02-27 ENCOUNTER — Inpatient Hospital Stay (HOSPITAL_COMMUNITY): Payer: BLUE CROSS/BLUE SHIELD

## 2017-02-27 LAB — CBC WITH DIFFERENTIAL/PLATELET
BASOS PCT: 0 %
Basophils Absolute: 0 10*3/uL (ref 0.0–0.1)
Eosinophils Absolute: 0 10*3/uL (ref 0.0–0.7)
Eosinophils Relative: 0 %
HEMATOCRIT: 36.6 % — AB (ref 39.0–52.0)
HEMOGLOBIN: 12.4 g/dL — AB (ref 13.0–17.0)
Lymphocytes Relative: 5 %
Lymphs Abs: 1.1 10*3/uL (ref 0.7–4.0)
MCH: 29.2 pg (ref 26.0–34.0)
MCHC: 33.9 g/dL (ref 30.0–36.0)
MCV: 86.1 fL (ref 78.0–100.0)
Monocytes Absolute: 0.7 10*3/uL (ref 0.1–1.0)
Monocytes Relative: 3 %
NEUTROS ABS: 19.7 10*3/uL — AB (ref 1.7–7.7)
NEUTROS PCT: 92 %
Platelets: 213 10*3/uL (ref 150–400)
RBC: 4.25 MIL/uL (ref 4.22–5.81)
RDW: 15.7 % — ABNORMAL HIGH (ref 11.5–15.5)
WBC: 21.5 10*3/uL — ABNORMAL HIGH (ref 4.0–10.5)

## 2017-02-27 LAB — BASIC METABOLIC PANEL
ANION GAP: 6 (ref 5–15)
BUN: 30 mg/dL — ABNORMAL HIGH (ref 6–20)
CALCIUM: 8.5 mg/dL — AB (ref 8.9–10.3)
CO2: 26 mmol/L (ref 22–32)
Chloride: 99 mmol/L — ABNORMAL LOW (ref 101–111)
Creatinine, Ser: 0.89 mg/dL (ref 0.61–1.24)
Glucose, Bld: 134 mg/dL — ABNORMAL HIGH (ref 65–99)
Potassium: 5.2 mmol/L — ABNORMAL HIGH (ref 3.5–5.1)
Sodium: 131 mmol/L — ABNORMAL LOW (ref 135–145)

## 2017-02-27 MED ORDER — DEXTROSE-NACL 5-0.45 % IV SOLN
INTRAVENOUS | Status: DC
  Administered 2017-02-27 – 2017-02-28 (×2): via INTRAVENOUS

## 2017-02-27 NOTE — Progress Notes (Signed)
Consult NOTE    [name] [name]  ZHG:[id number] DOB: 06-Sep-1951 DOA: 02/25/2017 PCP: [name], MD  Outpatient Specialists:     Brief Narrative:  41  htn Prior active smoker stg IV nscls + brain mets/embolic cva  Supposed to get echo/CT neck  Admitted by gen surgery for perf viscus Had modified grham patch placed 02/25/17  Has NG tube    Assessment & Plan:   Principal Problem:   Perforated duodenal ulcer ([place]) s/p exploratory laparotomy and placement of Graham patch 02/25/17 Active Problems:   Metastatic squamous cell carcinoma to lung, unspecified laterality ([place])   perf abd viscus Diet-as npo currently on d5/o.45 75 cc/h with 20 kcl--changed as below on 5/12 Pain meds Antibiotics and duration-Per gen surgery  htn When can take PO, transition to home med of hctz 25, lisinopril 40 For now hydralazine 10 q2 prn high bp  Mild hyperkalemia- D/c d5 and K Placed on 5/13 on D5/0.45 75 cc/h    aki Bun creat on admit 39/1.3 Labs now 30/0.8 and improving  Recent diagnosis stage IV ca with mets/infracts Resume asa 81 when ok by surgery Interval MRI with neuro needed in mid July Will probably need weaning off of steroids going forward.  However with brain mets would continue decadron for the present time    lovenox Inpatient Full code   Consultants:   none  Procedures:   Phillip Heal patch 5/11 for perf viscus  Antimicrobials:   Zosyn 5/12    Subjective:  Pleasant alert oriented Has NG replaced and repositioned asking me about diet -but understands cannot eat  Objective: Vitals:   02/26/17 1516 02/26/17 2114 02/27/17 0612 02/27/17 1334  BP: (!) 149/98 (!) 158/90 (!) 151/82 (!) 142/90  Pulse: 90 91 92 78  Resp: '16 16 18 16  '$ Temp:  97.7 F (36.5 C) 98.7 F (37.1 C) 98.2 F (36.8 C)  TempSrc:  Oral Oral Oral  SpO2: 92% 93% 92% 91%  Weight:      Height:        Intake/Output Summary (Last 24 hours) at 02/27/17 1409 Last data filed at 02/27/17  1325  Gross per 24 hour  Intake          1406.25 ml  Output             2875 ml  Net         -1468.75 ml   Filed Weights   02/26/17 0200  Weight: 100.3 kg (221 lb 1.9 oz)    Examination:     Data Reviewed: I have personally reviewed following labs and imaging studies  CBC:  Recent Labs Lab 02/22/17 1039 02/25/17 1416 02/26/17 0350 02/27/17 0457  WBC 17.6* 43.3* 31.8* 21.5*  NEUTROABS 15.4* 40.3*  --  19.7*  HGB 15.1 16.5 13.8 12.4*  HCT 45.5 48.5 40.9 36.6*  MCV 86.7 86.9 86.8 86.1  PLT 200 173 217 196   Basic Metabolic Panel:  Recent Labs Lab 02/22/17 1039 02/25/17 1416 02/26/17 0350 02/27/17 0457  NA 135* 137 134* 131*  K 4.7 4.8 5.6* 5.2*  CL  --  105 101 99*  CO2 '27 25 26 26  '$ GLUCOSE 127 122* 141* 134*  BUN 28.5* 39* 33* 30*  CREATININE 0.9 1.31* 0.93 0.89  CALCIUM 9.1 8.1* 8.0* 8.5*   GFR: Estimated Creatinine Clearance: 97.1 mL/min (by C-G formula based on SCr of 0.89 mg/dL). Liver Function Tests:  Recent Labs Lab 02/22/17 1039  AST 12  ALT 23  ALKPHOS 78  BILITOT 0.57  PROT 6.8  ALBUMIN 3.7   No results for input(s): LIPASE, AMYLASE in the last 168 hours. No results for input(s): AMMONIA in the last 168 hours. Coagulation Profile: No results for input(s): INR, PROTIME in the last 168 hours. Cardiac Enzymes: No results for input(s): CKTOTAL, CKMB, CKMBINDEX, TROPONINI in the last 168 hours. BNP (last 3 results) No results for input(s): PROBNP in the last 8760 hours. HbA1C: No results for input(s): HGBA1C in the last 72 hours. CBG: No results for input(s): GLUCAP in the last 168 hours. Lipid Profile: No results for input(s): CHOL, HDL, LDLCALC, TRIG, CHOLHDL, LDLDIRECT in the last 72 hours. Thyroid Function Tests: No results for input(s): TSH, T4TOTAL, FREET4, T3FREE, THYROIDAB in the last 72 hours. Anemia Panel: No results for input(s): VITAMINB12, FOLATE, FERRITIN, TIBC, IRON, RETICCTPCT in the last 72 hours. Urine analysis: No  results found for: COLORURINE, APPEARANCEUR, LABSPEC, [place], GLUCOSEU, HGBUR, BILIRUBINUR, KETONESUR, PROTEINUR, UROBILINOGEN, NITRITE, LEUKOCYTESUR Sepsis Labs: '@LABRCNTIP'$ (procalcitonin:4,lacticidven:4)  ) Recent Results (from the past 240 hour(s))  MRSA PCR Screening     Status: Abnormal   Collection Time: 02/25/17  6:41 PM  Result Value Ref Range Status   MRSA by PCR POSITIVE (A) NEGATIVE Final    Comment:        The GeneXpert MRSA Assay (FDA approved for NASAL specimens only), is one component of a comprehensive MRSA colonization surveillance program. It is not intended to diagnose MRSA infection nor to guide or monitor treatment for MRSA infections. RESULT CALLED TO, READ BACK BY AND VERIFIED WITHSilvio Clayman RN [id number] 02/25/17 [facility]          Radiology Studies: Dg Abd Portable 1v  Result Date: 02/27/2017 CLINICAL DATA:  Enteric tube placed EXAM: PORTABLE ABDOMEN - 1 VIEW COMPARISON:  None. FINDINGS: Enteric tube terminates in the region of the esophagogastric junction with the side port in the lower thoracic esophagus. Mild diffuse gaseous distention of small and large bowel loops in the visualized abdomen. No evidence of pneumatosis or pneumoperitoneum. IMPRESSION: Enteric tube terminates in the region of the esophagogastric junction, recommend advancing 10 cm. These results will be called to the ordering clinician or representative by the Radiologist Assistant, and communication documented in the PACS or zVision Dashboard. Electronically Signed   By: Ilona Sorrel M.D.   On: 02/27/2017 11:37        Scheduled Meds: . dexamethasone  4 mg Intravenous Q8H  . enoxaparin (LOVENOX) injection  40 mg Subcutaneous Q24H  . mirtazapine  30 mg Oral QHS  . pantoprazole (PROTONIX) IV  40 mg Intravenous Q12H   Continuous Infusions: . dextrose 5 % and 0.45% NaCl 75 mL/hr at 02/27/17 1325  . piperacillin-tazobactam (ZOSYN)  IV 3.375 g (02/27/17 1325)     LOS: 2 days     Time spent: [place], MD [facility]   If 7PM-7AM, please contact night-coverage [email or web address] Password [email or web address] 02/27/2017, 2:09 PM

## 2017-02-27 NOTE — Progress Notes (Signed)
Hematology- Oncology Stopped by to see the patient. Patient has a NG tube and has abdominal discomfort for which he just received morphine. His biggest complaint is not being able to eat as he feels extremely hungry. I reviewed his blood counts and do not see any current oncology issues. I will inform Dr. Julien Nordmann regarding his recent surgery.

## 2017-02-27 NOTE — Progress Notes (Signed)
From IM perspective, patient is clinically and hemodynamically stable. I will review him briefly in am, but if no complicating medical issues, will likely sign off.   Please let me know if further assitance required. Thank you for consult  Verneita Griffes, MD [facility] [phone]

## 2017-02-27 NOTE — Progress Notes (Signed)
Radiology called results of NG xray, advised to advance tube 10cm. Tube was advanced and flushed, patient tolerated well.

## 2017-02-27 NOTE — Progress Notes (Signed)
Patient ID: [name] [name], male   DOB: 11/09/1950, 66 y.o.   MRN: [id number] [facility] Progress Note:   2 Days Post-Op  Subjective: Mental status is clear.  Complaining of NG  Objective: Vital signs in last 24 hours: Temp:  [97.7 F (36.5 C)-98.7 F (37.1 C)] 98.7 F (37.1 C) (05/13 0612) Pulse Rate:  [83-92] 92 (05/13 0612) Resp:  [13-18] 18 (05/13 0612) BP: (115-158)/(60-98) 151/82 (05/13 0612) SpO2:  [92 %-93 %] 92 % (05/13 0612)  Intake/Output from previous day: 05/12 0701 - 05/13 0700 In: 4315 [P.O.:60; I.V.:1310; IV Piggyback:100] Out: 1825 [Urine:1575; Emesis/NG output:250] Intake/Output this shift: Total I/O In: 0  Out: 325 [Urine:325]  Physical Exam: Work of breathing is not labored.  NG with scant brown.    Lab Results:  Results for orders placed or performed during the hospital encounter of 02/25/17 (from the past 48 hour(s))  CBC with Differential/Platelet     Status: Abnormal   Collection Time: 02/25/17  2:16 PM  Result Value Ref Range   WBC 43.3 (H) 4.0 - 10.5 K/uL   RBC 5.58 4.22 - 5.81 MIL/uL   Hemoglobin 16.5 13.0 - 17.0 g/dL   HCT 48.5 39.0 - 52.0 %   MCV 86.9 78.0 - 100.0 fL   MCH 29.6 26.0 - 34.0 pg   MCHC 34.0 30.0 - 36.0 g/dL   RDW 15.5 11.5 - 15.5 %   Platelets 173 150 - 400 K/uL   Neutrophils Relative % 93 %   Lymphocytes Relative 4 %   Monocytes Relative 3 %   Eosinophils Relative 0 %   Basophils Relative 0 %   Neutro Abs 40.3 (H) 1.7 - 7.7 K/uL   Lymphs Abs 1.7 0.7 - 4.0 K/uL   Monocytes Absolute 1.3 (H) 0.1 - 1.0 K/uL   Eosinophils Absolute 0.0 0.0 - 0.7 K/uL   Basophils Absolute 0.0 0.0 - 0.1 K/uL   WBC Morphology WHITE COUNT CONFIRMED ON SMEAR   Basic metabolic panel     Status: Abnormal   Collection Time: 02/25/17  2:16 PM  Result Value Ref Range   Sodium 137 135 - 145 mmol/L   Potassium 4.8 3.5 - 5.1 mmol/L   Chloride 105 101 - 111 mmol/L   CO2 25 22 - 32 mmol/L   Glucose, Bld 122 (H) 65 - 99 mg/dL   BUN 39 (H) 6 -  20 mg/dL   Creatinine, Ser 1.31 (H) 0.61 - 1.24 mg/dL   Calcium 8.1 (L) 8.9 - 10.3 mg/dL   GFR calc non Af Amer 56 (L) >60 mL/min   GFR calc Af Amer >60 >60 mL/min    Comment: (NOTE) The eGFR has been calculated using the CKD EPI equation. This calculation has not been validated in all clinical situations. eGFR's persistently <60 mL/min signify possible Chronic Kidney Disease.    Anion gap 7 5 - 15  Lactic acid, plasma     Status: Abnormal   Collection Time: 02/25/17  2:43 PM  Result Value Ref Range   Lactic Acid, Venous 2.3 (HH) 0.5 - 1.9 mmol/L    Comment: CRITICAL RESULT CALLED TO, READ BACK BY AND VERIFIED WITH: FITZGERALD,R @ 1536 ON [id number] BY POTEAT,S   Type and screen [place]     Status: None   Collection Time: 02/25/17  2:45 PM  Result Value Ref Range   ABO/RH(D) A POS    Antibody Screen NEG    Sample Expiration 02/28/2017   ABO/Rh  Status: None   Collection Time: 02/25/17  2:50 PM  Result Value Ref Range   ABO/RH(D) A POS   Lactic acid, plasma     Status: None   Collection Time: 02/25/17  6:31 PM  Result Value Ref Range   Lactic Acid, Venous 1.9 0.5 - 1.9 mmol/L  MRSA PCR Screening     Status: Abnormal   Collection Time: 02/25/17  6:41 PM  Result Value Ref Range   MRSA by PCR POSITIVE (A) NEGATIVE    Comment:        The GeneXpert MRSA Assay (FDA approved for NASAL specimens only), is one component of a comprehensive MRSA colonization surveillance program. It is not intended to diagnose MRSA infection nor to guide or monitor treatment for MRSA infections. RESULT CALLED TO, READ BACK BY AND VERIFIED WITH: R MACINTOSH RN [id number] 02/25/17 A [facility]   HIV antibody (Routine Testing)     Status: None   Collection Time: 02/25/17  6:59 PM  Result Value Ref Range   HIV Screen 4th Generation wRfx Non Reactive Non Reactive    Comment: (NOTE) Performed At: [facility] [place], [place] [id number] Lindon Romp MD  KT:[id number]   Basic metabolic panel     Status: Abnormal   Collection Time: 02/26/17  3:50 AM  Result Value Ref Range   Sodium 134 (L) 135 - 145 mmol/L   Potassium 5.6 (H) 3.5 - 5.1 mmol/L   Chloride 101 101 - 111 mmol/L   CO2 26 22 - 32 mmol/L   Glucose, Bld 141 (H) 65 - 99 mg/dL   BUN 33 (H) 6 - 20 mg/dL   Creatinine, Ser 0.93 0.61 - 1.24 mg/dL   Calcium 8.0 (L) 8.9 - 10.3 mg/dL   GFR calc non Af Amer >60 >60 mL/min   GFR calc Af Amer >60 >60 mL/min    Comment: (NOTE) The eGFR has been calculated using the CKD EPI equation. This calculation has not been validated in all clinical situations. eGFR's persistently <60 mL/min signify possible Chronic Kidney Disease.    Anion gap 7 5 - 15  CBC     Status: Abnormal   Collection Time: 02/26/17  3:50 AM  Result Value Ref Range   WBC 31.8 (H) 4.0 - 10.5 K/uL   RBC 4.71 4.22 - 5.81 MIL/uL   Hemoglobin 13.8 13.0 - 17.0 g/dL   HCT 40.9 39.0 - 52.0 %   MCV 86.8 78.0 - 100.0 fL   MCH 29.3 26.0 - 34.0 pg   MCHC 33.7 30.0 - 36.0 g/dL   RDW 15.5 11.5 - 15.5 %   Platelets 217 150 - 400 K/uL  Basic metabolic panel     Status: Abnormal   Collection Time: 02/27/17  4:57 AM  Result Value Ref Range   Sodium 131 (L) 135 - 145 mmol/L   Potassium 5.2 (H) 3.5 - 5.1 mmol/L   Chloride 99 (L) 101 - 111 mmol/L   CO2 26 22 - 32 mmol/L   Glucose, Bld 134 (H) 65 - 99 mg/dL   BUN 30 (H) 6 - 20 mg/dL   Creatinine, Ser 0.89 0.61 - 1.24 mg/dL   Calcium 8.5 (L) 8.9 - 10.3 mg/dL   GFR calc non Af Amer >60 >60 mL/min   GFR calc Af Amer >60 >60 mL/min    Comment: (NOTE) The eGFR has been calculated using the CKD EPI equation. This calculation has not been validated in all clinical situations. eGFR's persistently <60  mL/min signify possible Chronic Kidney Disease.    Anion gap 6 5 - 15  CBC with Differential/Platelet     Status: Abnormal   Collection Time: 02/27/17  4:57 AM  Result Value Ref Range   WBC 21.5 (H) 4.0 - 10.5 K/uL   RBC 4.25 4.22 -  5.81 MIL/uL   Hemoglobin 12.4 (L) 13.0 - 17.0 g/dL   HCT 36.6 (L) 39.0 - 52.0 %   MCV 86.1 78.0 - 100.0 fL   MCH 29.2 26.0 - 34.0 pg   MCHC 33.9 30.0 - 36.0 g/dL   RDW 15.7 (H) 11.5 - 15.5 %   Platelets 213 150 - 400 K/uL   Neutrophils Relative % 92 %   Neutro Abs 19.7 (H) 1.7 - 7.7 K/uL   Lymphocytes Relative 5 %   Lymphs Abs 1.1 0.7 - 4.0 K/uL   Monocytes Relative 3 %   Monocytes Absolute 0.7 0.1 - 1.0 K/uL   Eosinophils Relative 0 %   Eosinophils Absolute 0.0 0.0 - 0.7 K/uL   Basophils Relative 0 %   Basophils Absolute 0.0 0.0 - 0.1 K/uL    Radiology/Results: No results found.  Anti-infectives: Anti-infectives    Start     Dose/Rate Route Frequency Ordered Stop   02/26/17 0615  piperacillin-tazobactam (ZOSYN) IVPB 3.375 g     3.375 g 12.5 mL/hr over 240 Minutes Intravenous Every 8 hours 02/26/17 0606     02/25/17 1415  piperacillin-tazobactam (ZOSYN) IVPB 3.375 g  Status:  Discontinued     3.375 g 12.5 mL/hr over 240 Minutes Intravenous Every 8 hours 02/25/17 1400 02/26/17 0605      Assessment/Plan: Problem List: Patient Active Problem List   Diagnosis Date Noted  . Diverticulitis of colon with perforation 02/25/2017  . Perforated duodenal ulcer ([place]) s/p exploratory laparotomy and placement of Graham patch 02/25/17 02/25/2017  . H/O exploratory laparotomy 02/25/2017  . Brain metastases ([place]) 02/15/2017  . Encounter for antineoplastic chemotherapy 01/29/2017  . Goals of care, counseling/discussion 01/29/2017  . Depression 01/29/2017  . Encounter for smoking cessation counseling 01/29/2017  . Metastatic squamous cell carcinoma to lung, unspecified laterality ([place]) 01/28/2017  . Pulmonary nodules     Will check KUB after NG was removed and reinserted.  May study with water soluble contrast before removing NG 2 Days Post-Op    LOS: 2 days   Matt B. Hassell Done, MD, [facility] [phone] beeper [phone]  02/27/2017 9:04 AM

## 2017-02-28 ENCOUNTER — Encounter (HOSPITAL_COMMUNITY): Payer: Self-pay | Admitting: General Surgery

## 2017-02-28 ENCOUNTER — Inpatient Hospital Stay (HOSPITAL_COMMUNITY): Payer: BLUE CROSS/BLUE SHIELD

## 2017-02-28 DIAGNOSIS — C3490 Malignant neoplasm of unspecified part of unspecified bronchus or lung: Secondary | ICD-10-CM

## 2017-02-28 LAB — CBC
HCT: 37.1 % — ABNORMAL LOW (ref 39.0–52.0)
HEMOGLOBIN: 12.8 g/dL — AB (ref 13.0–17.0)
MCH: 29.4 pg (ref 26.0–34.0)
MCHC: 34.5 g/dL (ref 30.0–36.0)
MCV: 85.3 fL (ref 78.0–100.0)
Platelets: 221 10*3/uL (ref 150–400)
RBC: 4.35 MIL/uL (ref 4.22–5.81)
RDW: 15.1 % (ref 11.5–15.5)
WBC: 11.4 10*3/uL — ABNORMAL HIGH (ref 4.0–10.5)

## 2017-02-28 LAB — BASIC METABOLIC PANEL
Anion gap: 6 (ref 5–15)
BUN: 33 mg/dL — ABNORMAL HIGH (ref 6–20)
CALCIUM: 8.6 mg/dL — AB (ref 8.9–10.3)
CO2: 29 mmol/L (ref 22–32)
Chloride: 99 mmol/L — ABNORMAL LOW (ref 101–111)
Creatinine, Ser: 0.78 mg/dL (ref 0.61–1.24)
Glucose, Bld: 137 mg/dL — ABNORMAL HIGH (ref 65–99)
Potassium: 4.5 mmol/L (ref 3.5–5.1)
Sodium: 134 mmol/L — ABNORMAL LOW (ref 135–145)

## 2017-02-28 MED ORDER — DEXTROSE-NACL 5-0.9 % IV SOLN
INTRAVENOUS | Status: DC
Start: 2017-02-28 — End: 2017-03-01
  Administered 2017-02-28 – 2017-03-01 (×2): via INTRAVENOUS

## 2017-02-28 MED ORDER — SILVER NITRATE-POT NITRATE 75-25 % EX MISC
10.0000 | Freq: Once | CUTANEOUS | Status: DC
  Filled 2017-02-28: qty 10

## 2017-02-28 MED ORDER — IOPAMIDOL (ISOVUE-300) INJECTION 61%
INTRAVENOUS | Status: AC
  Administered 2017-02-28: 09:00:00 150 mL
  Filled 2017-02-28: qty 150

## 2017-02-28 NOTE — Progress Notes (Signed)
Encouraged patient to ambulate in hall. Patient stated he would this evening and became agitated. Educated patient on ambulation after surgery and blood clots, SBOs.

## 2017-02-28 NOTE — Progress Notes (Signed)
3 Days Post-Op    CC:  Subjective: He complains of being hungry.  Dressing not changed since surgery.  I took dressing down and redressed it.  Open wound looks good.  Some flatus, no BM.  He had some bleeding from the skin edges.  It stopped with direct pressure by the time the Silver Nitrate sticks got here.  Not much from the NG.  Objective: Vital signs in last 24 hours: Temp:  [97.8 F (36.6 C)-98.2 F (36.8 C)] 97.8 F (36.6 C) (05/14 0529) Pulse Rate:  [70-86] 70 (05/14 0529) Resp:  [16] 16 (05/14 0529) BP: (138-168)/(84-95) 138/95 (05/14 0529) SpO2:  [91 %-95 %] 95 % (05/14 0529) Last BM Date: 02/25/17 NPO 1800 IV Urine:  4580 NG 50 NO Bm Afebrile, VSS - BP up some No labs this AM  Intake/Output from previous day: 05/13 0701 - 05/14 0700 In: 1950 [I.V.:1600; NG/GT:150; IV Piggyback:200] Out: 4630 [Urine:4580; Emesis/NG output:50] Intake/Output this shift: No intake/output data recorded.  General appearance: alert, cooperative and no distress Resp: clear to auscultation bilaterally GI: Soft, few BS, open wound ok, some bleeding from the skin edges.   Lab Results:   Recent Labs  02/26/17 0350 02/27/17 0457  WBC 31.8* 21.5*  HGB 13.8 12.4*  HCT 40.9 36.6*  PLT 217 213    BMET  Recent Labs  02/26/17 0350 02/27/17 0457  NA 134* 131*  K 5.6* 5.2*  CL 101 99*  CO2 26 26  GLUCOSE 141* 134*  BUN 33* 30*  CREATININE 0.93 0.89  CALCIUM 8.0* 8.5*   PT/INR No results for input(s): LABPROT, INR in the last 72 hours.   Recent Labs Lab 02/22/17 1039  AST 12  ALT 23  ALKPHOS 78  BILITOT 0.57  PROT 6.8  ALBUMIN 3.7     Lipase  No results found for: LIPASE   Prior to Admission medications   Medication Sig Start Date End Date Taking? Authorizing Provider  aspirin 81 MG chewable tablet Chew 81 mg by mouth daily.   Yes [provider]  hydrochlorothiazide (HYDRODIURIL) 25 MG tablet Take 25 mg by mouth daily.   Yes [provider]   lisinopril (PRINIVIL,ZESTRIL) 40 MG tablet Take 40 mg by mouth daily.   Yes [provider]  mirtazapine (REMERON) 30 MG tablet Take 1 tablet (30 mg total) by mouth at bedtime. 01/28/17  Yes [name], MD  dexamethasone (DECADRON) 4 MG tablet Take 1 tablet (4 mg total) by mouth 3 (three) times daily. Patient not taking: Reported on 02/25/2017 02/09/17   [name], MD  nicotine (NICODERM CQ - DOSED IN MG/24 HOURS) 21 mg/24hr patch Place 1 patch (21 mg total) onto the skin daily. Patient not taking: Reported on 02/23/2017 01/03/17   Marshell Garfinkel, MD  oxyCODONE-acetaminophen (PERCOCET/ROXICET) 5-325 MG tablet Take 1 tablet by mouth every 6 (six) hours as needed for severe pain. Patient not taking: Reported on 02/25/2017 02/15/17   [name], MD  prochlorperazine (COMPAZINE) 10 MG tablet Take 1 tablet (10 mg total) by mouth every 6 (six) hours as needed for nausea or vomiting. Patient not taking: Reported on 02/25/2017 01/28/17   [name], MD    Medications: . dexamethasone  4 mg Intravenous Q8H  . enoxaparin (LOVENOX) injection  40 mg Subcutaneous Q24H  . mirtazapine  30 mg Oral QHS  . pantoprazole (PROTONIX) IV  40 mg Intravenous Q12H   . dextrose 5 % and 0.45% NaCl 75 mL/hr at 02/28/17 0243  .  piperacillin-tazobactam (ZOSYN)  IV 3.375 g (02/28/17 0600)   Anti-infectives    Start     Dose/Rate Route Frequency Ordered Stop   02/26/17 0615  piperacillin-tazobactam (ZOSYN) IVPB 3.375 g     3.375 g 12.5 mL/hr over 240 Minutes Intravenous Every 8 hours 02/26/17 0606     02/25/17 1415  piperacillin-tazobactam (ZOSYN) IVPB 3.375 g  Status:  Discontinued     3.375 g 12.5 mL/hr over 240 Minutes Intravenous Every 8 hours 02/25/17 1400 02/26/17 0605      Assessment/Plan  Perforated pyloric channel ulcer S/p Modified Graham's patch, 02/25/17, Dr. Lurena Joiner Kinsinger  Hyponatremia, elevated K+ yesterday Stage IV non small cell lung cancer/Brain metastasis - Dr.  Inda Merlin Hypertension Hx of depression FEN:  IV fluids/NPO => D5ns ID:  Zosyn 5/11 =>> day 4 DVT:  Lovenox   Plan:  Change IV, recheck labs, check for H pylori, UGI with gastrografin this AM. Start dressing changes.  LOS: 3 days    [name] [name] 02/28/2017 [phone]

## 2017-02-28 NOTE — Progress Notes (Signed)
Subjective: The patient is seen and examined today. He is feeling fine with no specific complaints. He continues to have drainage with the nasogastric tube. He denied having any fever or chills. He has no nausea or vomiting. He denied having any chest pain, shortness of breath, cough or hemoptysis. He was recently admitted with perforated pyloric ulcer. He underwent surgical intervention with modified Graham's patch.   Objective: Vital signs in last 24 hours: Temp:  [97.8 F (36.6 C)-98.2 F (36.8 C)] 97.8 F (36.6 C) (05/14 0529) Pulse Rate:  [70-86] 70 (05/14 0529) Resp:  [16] 16 (05/14 0529) BP: (138-168)/(84-95) 138/95 (05/14 0529) SpO2:  [91 %-95 %] 95 % (05/14 0529)  Intake/Output from previous day: 05/13 0701 - 05/14 0700 In: 1950 [I.V.:1600; NG/GT:150; IV Piggyback:200] Out: 4630 [Urine:4580; Emesis/NG output:50] Intake/Output this shift: No intake/output data recorded.  General appearance: alert, cooperative and no distress Resp: clear to auscultation bilaterally Cardio: regular rate and rhythm, S1, S2 normal, no murmur, click, rub or gallop GI: Distended Extremities: extremities normal, atraumatic, no cyanosis or edema  Lab Results:   Recent Labs  02/26/17 0350 02/27/17 0457  WBC 31.8* 21.5*  HGB 13.8 12.4*  HCT 40.9 36.6*  PLT 217 213   BMET  Recent Labs  02/26/17 0350 02/27/17 0457  NA 134* 131*  K 5.6* 5.2*  CL 101 99*  CO2 26 26  GLUCOSE 141* 134*  BUN 33* 30*  CREATININE 0.93 0.89  CALCIUM 8.0* 8.5*    Studies/Results: Dg Abd Portable 1v  Result Date: 02/27/2017 CLINICAL DATA:  Enteric tube placed EXAM: PORTABLE ABDOMEN - 1 VIEW COMPARISON:  None. FINDINGS: Enteric tube terminates in the region of the esophagogastric junction with the side port in the lower thoracic esophagus. Mild diffuse gaseous distention of small and large bowel loops in the visualized abdomen. No evidence of pneumatosis or pneumoperitoneum. IMPRESSION: Enteric tube  terminates in the region of the esophagogastric junction, recommend advancing 10 cm. These results will be called to the ordering clinician or representative by the Radiologist Assistant, and communication documented in the PACS or zVision Dashboard. Electronically Signed   By: Ilona Sorrel M.D.   On: 02/27/2017 11:37    Medications: I have reviewed the patient's current medications.  CODE STATUS: Full code  Assessment/Plan: 1) metastatic non-small cell lung cancer, , squamous cell carcinoma. He is currently undergoing systemic chemotherapy with carboplatin and paclitaxel is status post 1 cycle. He tolerated the first cycle of his treatment well. I recommended for the patient to hold his chemotherapy for now until recovery from the surgical intervention. I will arrange for the patient follow-up appointment with me at the [facility] for reevaluation and discussion of this option. 2) perforated pyloric ulcer: Status post modified Graham's patch. He is currently under the care of surgery. 3) recent diagnosis of multiple stroke: He is followed by neurology. Thank you so much for taking good care of Mr. Markgraf, I will continue to follow up the patient with you and assist in his management on as-needed basis.   LOS: 3 days    Bret Vanessen K. 02/28/2017

## 2017-03-01 ENCOUNTER — Ambulatory Visit: Payer: BLUE CROSS/BLUE SHIELD

## 2017-03-01 ENCOUNTER — Ambulatory Visit: Payer: BLUE CROSS/BLUE SHIELD | Admitting: Internal Medicine

## 2017-03-01 ENCOUNTER — Other Ambulatory Visit: Payer: BLUE CROSS/BLUE SHIELD

## 2017-03-01 LAB — BASIC METABOLIC PANEL
ANION GAP: 7 (ref 5–15)
BUN: 32 mg/dL — ABNORMAL HIGH (ref 6–20)
CALCIUM: 8.3 mg/dL — AB (ref 8.9–10.3)
CO2: 27 mmol/L (ref 22–32)
CREATININE: 0.86 mg/dL (ref 0.61–1.24)
Chloride: 101 mmol/L (ref 101–111)
Glucose, Bld: 152 mg/dL — ABNORMAL HIGH (ref 65–99)
Potassium: 4.5 mmol/L (ref 3.5–5.1)
Sodium: 135 mmol/L (ref 135–145)

## 2017-03-01 LAB — CBC
HEMATOCRIT: 35.7 % — AB (ref 39.0–52.0)
HEMOGLOBIN: 12.1 g/dL — AB (ref 13.0–17.0)
MCH: 29 pg (ref 26.0–34.0)
MCHC: 33.9 g/dL (ref 30.0–36.0)
MCV: 85.6 fL (ref 78.0–100.0)
Platelets: 230 10*3/uL (ref 150–400)
RBC: 4.17 MIL/uL — ABNORMAL LOW (ref 4.22–5.81)
RDW: 15.3 % (ref 11.5–15.5)
WBC: 11.1 10*3/uL — AB (ref 4.0–10.5)

## 2017-03-01 LAB — H. PYLORI ANTIBODY, IGG: H Pylori IgG: <0.8 Index Value (ref 0.00–0.79)

## 2017-03-01 MED ORDER — LISINOPRIL 10 MG PO TABS
10.0000 mg | ORAL_TABLET | Freq: Every day | ORAL | Status: DC
  Administered 2017-03-01 – 2017-03-03 (×3): 10 mg via ORAL
  Filled 2017-03-01 (×3): qty 1

## 2017-03-01 MED ORDER — MUPIROCIN 2 % EX OINT
1.0000 "application " | TOPICAL_OINTMENT | Freq: Two times a day (BID) | CUTANEOUS | Status: DC
  Administered 2017-03-01 – 2017-03-03 (×5): 1 via NASAL
  Filled 2017-03-01: qty 22

## 2017-03-01 MED ORDER — CHLORHEXIDINE GLUCONATE CLOTH 2 % EX PADS
6.0000 | MEDICATED_PAD | Freq: Every day | CUTANEOUS | Status: DC
  Administered 2017-03-01 – 2017-03-02 (×2): 6 via TOPICAL

## 2017-03-01 MED ORDER — PANTOPRAZOLE SODIUM 40 MG PO TBEC
40.0000 mg | DELAYED_RELEASE_TABLET | Freq: Two times a day (BID) | ORAL | Status: DC
  Administered 2017-03-01 – 2017-03-03 (×4): 40 mg via ORAL
  Filled 2017-03-01 (×4): qty 1

## 2017-03-01 MED ORDER — ZOLPIDEM TARTRATE 5 MG PO TABS
5.0000 mg | ORAL_TABLET | Freq: Every evening | ORAL | Status: DC | PRN
  Administered 2017-03-01: 22:00:00 5 mg via ORAL
  Filled 2017-03-01: qty 1

## 2017-03-01 MED ORDER — SODIUM CHLORIDE 0.9 % IV SOLN
INTRAVENOUS | Status: DC
  Administered 2017-03-01: 22:00:00 via INTRAVENOUS
  Administered 2017-03-01: 12:00:00 75 mL/h via INTRAVENOUS
  Administered 2017-03-02 – 2017-03-03 (×2): via INTRAVENOUS

## 2017-03-01 NOTE — Progress Notes (Signed)
4 Days Post-Op    CC:  Abdominal pain  Subjective: He is doing well. He had some bleeding from the open abdominal site same place as yesterday. Someone is obviously applied the silver nitrate sticks. Overall looks good. Tolerating clears without difficulty. I asked him about his sugars and he said those have been elevated with his recent chemotherapy. This is new with the chemotherapy. No BM so far  Objective: Vital signs in last 24 hours: Temp:  [97.7 F (36.5 C)-98 F (36.7 C)] 97.7 F (36.5 C) (05/15 0634) Pulse Rate:  [65-71] 65 (05/15 0634) Resp:  [16-18] 18 (05/15 0634) BP: (139-167)/(80-89) 167/89 (05/15 0634) SpO2:  [92 %-95 %] 95 % (05/15 0634) Last BM Date: 02/25/17 720 PO 1951 IV 2575 urine Afebrile blood pressure somewhat elevated. BMP is stable glucoses are also elevated. WBC 11.1 no real change Upper GI 02/28/17: No evidence of leak from the stomach or duodenum. No reflux. Intake/Output from previous day: 05/14 0701 - 05/15 0700 In: 2871.7 [P.O.:720; I.V.:1951.7; IV Piggyback:200] Out: 2625 [Urine:2575; Emesis/NG output:50] Intake/Output this shift: No intake/output data recorded.   General appearance: alert, cooperative and no distress Resp: clear to auscultation bilaterally GI: Soft, few bowel sounds, open site looks good. Once I was cauterized with silver nitrate. Positive bowel sounds no bowel movement so far  Lab Results:   Recent Labs  02/28/17 0938 03/01/17 0424  WBC 11.4* 11.1*  HGB 12.8* 12.1*  HCT 37.1* 35.7*  PLT 221 230    BMET  Recent Labs  02/28/17 0815 03/01/17 0424  NA 134* 135  K 4.5 4.5  CL 99* 101  CO2 29 27  GLUCOSE 137* 152*  BUN 33* 32*  CREATININE 0.78 0.86  CALCIUM 8.6* 8.3*   PT/INR No results for input(s): LABPROT, INR in the last 72 hours.   Recent Labs Lab 02/22/17 1039  AST 12  ALT 23  ALKPHOS 78  BILITOT 0.57  PROT 6.8  ALBUMIN 3.7     Lipase  No results found for: LIPASE   Prior to Admission  medications   Medication Sig Start Date End Date Taking? Authorizing Provider  aspirin 81 MG chewable tablet Chew 81 mg by mouth daily.   Yes [provider]  hydrochlorothiazide (HYDRODIURIL) 25 MG tablet Take 25 mg by mouth daily.   Yes [provider]  lisinopril (PRINIVIL,ZESTRIL) 40 MG tablet Take 40 mg by mouth daily.   Yes [provider]  mirtazapine (REMERON) 30 MG tablet Take 1 tablet (30 mg total) by mouth at bedtime. 01/28/17  Yes [name] Bears, MD  dexamethasone (DECADRON) 4 MG tablet Take 1 tablet (4 mg total) by mouth 3 (three) times daily. Patient not taking: Reported on 02/25/2017 02/09/17   [name] Bears, MD  nicotine (NICODERM CQ - DOSED IN MG/24 HOURS) 21 mg/24hr patch Place 1 patch (21 mg total) onto the skin daily. Patient not taking: Reported on 02/23/2017 01/03/17   [name] Garfinkel, MD  oxyCODONE-acetaminophen (PERCOCET/ROXICET) 5-325 MG tablet Take 1 tablet by mouth every 6 (six) hours as needed for severe pain. Patient not taking: Reported on 02/25/2017 02/15/17   [name] Bears, MD  prochlorperazine (COMPAZINE) 10 MG tablet Take 1 tablet (10 mg total) by mouth every 6 (six) hours as needed for nausea or vomiting. Patient not taking: Reported on 02/25/2017 01/28/17   [name] Bears, MD    Medications: . dexamethasone  4 mg Intravenous Q8H  . enoxaparin (LOVENOX) injection  40 mg Subcutaneous Q24H  . mirtazapine  30 mg Oral QHS  . pantoprazole (PROTONIX) IV  40 mg Intravenous Q12H  . silver nitrate applicators  10 Stick Topical Once   . dextrose 5 % and 0.9% NaCl 100 mL/hr at 03/01/17 0320  . piperacillin-tazobactam (ZOSYN)  IV 3.375 g (03/01/17 6415)    Assessment/Plan Perforated pyloric channel ulcer S/p Modified Graham's patch, 02/25/17, Dr. Lurena Joiner [name]  Hyponatremia, elevated K+ yesterday Stage IV non small cell lung cancer/Brain metastasis/chemotherapy - Dr. Inda [name] Hyperglycemia Hypertension Hx of depression FEN:  IV  fluids/clears ID:  Zosyn 5/11 =>> day 5 DVT:  Lovenox  Plan: Go to full liquids, take the glucose and his IV fluids and decrease IV fluid rate. Mobilize. Restart lisinopril for his blood pressure. Change Protonix over oral form from IV.  Ask wound care to evaluate for wound VAC. Checking hemoglobin A1c/H. pylori pending.     LOS: 4 days    [name] [name] 03/01/2017 [phone]

## 2017-03-01 NOTE — Progress Notes (Signed)
Discharge planning, spoke with patient at beside. [place] for [employer], they are out of network with [facility] [employer] [facility]. They suggested [facility], contacted them for referral. They are able to accept him, clinicals and orders faxed to them at [phone]. Auth form for [employer] placed on shadow chart for attending signature. [phone]

## 2017-03-01 NOTE — Consult Note (Signed)
[place] Nurse wound consult note Reason for Consult: eval for NPWT dressing  Wound type: midline abdominal; surgical Pressure Injury POA: No Measurement:17.5cm x 3.7cm x 3.0 Wound bed: clean, subcutaneous tissue Drainage (amount, consistency, odor) minimal, some serousanginous pooling at the proximal end of the wound Periwound: intact Dressing procedure/placement/frequency: 1pc of black foam used to fill wound bed, seal at 15mHG. Patient tolerated well. Non complex NPWT dressing, ok for bedside nurse to assume changes T/Th/Sat.  Discussed POC with patient and bedside nurse.  Re consult if needed, will not follow at this time. Thanks  [place] Chelf AR.R. [place]nelley RN,CWOCN, CNS [phone]

## 2017-03-01 NOTE — Progress Notes (Signed)
Pt having complaints of not sleeping well the last few nights and would like something to help him sleep tonight. Notified on-call physician for [facility]. Received a call back from Dr. Excell Seltzer and received new orders for ambien '5mg'$  QHS prn.

## 2017-03-02 LAB — CBC
HEMATOCRIT: 36.5 % — AB (ref 39.0–52.0)
Hemoglobin: 12.4 g/dL — ABNORMAL LOW (ref 13.0–17.0)
MCH: 29.2 pg (ref 26.0–34.0)
MCHC: 34 g/dL (ref 30.0–36.0)
MCV: 86.1 fL (ref 78.0–100.0)
Platelets: 235 10*3/uL (ref 150–400)
RBC: 4.24 MIL/uL (ref 4.22–5.81)
RDW: 15.5 % (ref 11.5–15.5)
WBC: 9.7 10*3/uL (ref 4.0–10.5)

## 2017-03-02 LAB — BASIC METABOLIC PANEL
Anion gap: 7 (ref 5–15)
BUN: 30 mg/dL — AB (ref 6–20)
CALCIUM: 8.4 mg/dL — AB (ref 8.9–10.3)
CO2: 27 mmol/L (ref 22–32)
CREATININE: 0.97 mg/dL (ref 0.61–1.24)
Chloride: 103 mmol/L (ref 101–111)
GFR calc non Af Amer: >60 mL/min (ref >60)
Glucose, Bld: 110 mg/dL — ABNORMAL HIGH (ref 65–99)
Potassium: 4.4 mmol/L (ref 3.5–5.1)
Sodium: 137 mmol/L (ref 135–145)

## 2017-03-02 LAB — HEMOGLOBIN A1C
Hgb A1c MFr Bld: 6.4 % — ABNORMAL HIGH (ref 4.8–5.6)
Mean Plasma Glucose: 137 mg/dL

## 2017-03-02 MED ORDER — LIVING WELL WITH DIABETES BOOK
Freq: Once | Status: AC
  Administered 2017-03-02: 14:00:00
  Filled 2017-03-02: qty 1

## 2017-03-02 MED ORDER — BISACODYL 10 MG RE SUPP
10.0000 mg | Freq: Once | RECTAL | Status: AC
  Administered 2017-03-02: 10 mg via RECTAL
  Filled 2017-03-02: qty 1

## 2017-03-02 NOTE — Progress Notes (Signed)
Inpatient Diabetes Program Recommendations  AACE/ADA: New Consensus Statement on Inpatient Glycemic Control (2015)  Target Ranges:  Prepandial:   less than 140 mg/dL      Peak postprandial:   less than 180 mg/dL (1-2 hours)      Critically ill patients:  140 - 180 mg/dL   Lab Results  Component Value Date   GLUCAP 110 (H) 01/03/2017   HGBA1C 6.4 (H) 03/01/2017    Review of Glycemic Control  Diabetes history: Pre-diabetes Outpatient Diabetes medications: None Current orders for Inpatient glycemic control: None  Inpatient Diabetes Program Recommendations:    Novolog 0-9 units tidwc while inpatient.  Spoke with pt about HgbA1C of 6.4%. Explained this is pre-diabetes and HgbA1C of 6.5% and above is a diagnosis of Diabetes. Discussed importance of getting some exercise each day and eating a healthy diet. Limit sweets ,juice and regular sodas. If interested, pt may purchase glucose meter and check blood sugars once/week. Take logbook to PCP for review. Answered questions and ordered Living Well book for reference.  Thank you. Lorenda Peck, RD, LDN, CDE Inpatient Diabetes Coordinator [phone]

## 2017-03-02 NOTE — Progress Notes (Signed)
5 Days Post-Op    CC: Abdominal pain  Subjective: He looks better, still no BM tolerating PO's full liquids well. Wound vac is in.    Objective: Vital signs in last 24 hours: Temp:  [97.9 F (36.6 C)-98.2 F (36.8 C)] 97.9 F (36.6 C) (05/16 0521) Pulse Rate:  [57-64] 57 (05/16 0521) Resp:  [16] 16 (05/16 0521) BP: (133-167)/(80-97) 151/90 (05/16 0521) SpO2:  [92 %-95 %] 95 % (05/16 0521) Last BM Date: 02/25/17 1200 by mouth 1500 IV 3410 urine No BM recorded Afebrile blood pressure still elevated. Labs continue to improve WBC is normal today.  Intake/Output from previous day: 05/15 0701 - 05/16 0700 In: 2716.3 [P.O.:1200; I.V.:1366.3; IV Piggyback:150] Out: 6606 [Urine:3410] Intake/Output this shift: No intake/output data recorded.  General appearance: alert, cooperative and no distress Resp: clear to auscultation bilaterally GI: Soft, sore, positive bowel sounds no bowel movement. Wound VAC in place.  Lab Results:   Recent Labs  03/01/17 0424 03/02/17 0403  WBC 11.1* 9.7  HGB 12.1* 12.4*  HCT 35.7* 36.5*  PLT 230 235    BMET  Recent Labs  03/01/17 0424 03/02/17 0403  NA 135 137  K 4.5 4.4  CL 101 103  CO2 27 27  GLUCOSE 152* 110*  BUN 32* 30*  CREATININE 0.86 0.97  CALCIUM 8.3* 8.4*   PT/INR No results for input(s): LABPROT, INR in the last 72 hours.  No results for input(s): AST, ALT, ALKPHOS, BILITOT, PROT, ALBUMIN in the last 168 hours.   Lipase  No results found for: LIPASE   Medications: . Chlorhexidine Gluconate Cloth  6 each Topical Q0600  . dexamethasone  4 mg Intravenous Q8H  . enoxaparin (LOVENOX) injection  40 mg Subcutaneous Q24H  . lisinopril  10 mg Oral Daily  . mirtazapine  30 mg Oral QHS  . mupirocin ointment  1 application Nasal BID  . pantoprazole  40 mg Oral BID  . silver nitrate applicators  10 Stick Topical Once    Assessment/Plan Perforated pyloric channel ulcer S/p Modified Graham's patch, 02/25/17, Dr. Lurena Joiner  Kinsinger  H Pylori is negative Hyponatremia, elevated K+ yesterday Stage IV non small cell lung cancer/Brain metastasis/chemotherapy - Dr. Inda Merlin Hyperglycemia Prediabetes  Hemoglobin A1C is 6.4  Medicine does not feel the need to see. Diabetes  Coordinator consult for education. Follow up with his PCP. Hypertension Hx of depression FEN: IV fluids/full liquids ID: Zosyn 5/11 =>>day 6 DVT: Lovenox  Plan: Dulcolax suppository. Advance diet at lunch time. Continue to mobilize,  continue antibiotics. Medicine consult for elevated A1c. I will look at the wound VAC in the a.m. make a decision about going home with without a wound VAC tomorrow.       LOS: 5 days    [name] [name] 03/02/2017 [phone]

## 2017-03-02 NOTE — Progress Notes (Signed)
Consult follow-up requested by general surgery-[name] Creig Hines PA-C-in regards to elevated A1c of 6.4.  Chart reviewed, internal medicine team had signed off on 5/13.  Blood glucose levels are in the 130s to 150s range.  Recommend that we just monitor CBGs without instituting any therapy at this time. He is a prediabetic, and [name] require outpatient follow-up with his PCP.  No further inpatient treatment recommended at this time, if CBGs become uncontrolled please do not hesitate to call us back.  (No charge note)

## 2017-03-03 LAB — BASIC METABOLIC PANEL
ANION GAP: 8 (ref 5–15)
BUN: 30 mg/dL — ABNORMAL HIGH (ref 6–20)
CO2: 26 mmol/L (ref 22–32)
Calcium: 8.3 mg/dL — ABNORMAL LOW (ref 8.9–10.3)
Chloride: 99 mmol/L — ABNORMAL LOW (ref 101–111)
Creatinine, Ser: 0.94 mg/dL (ref 0.61–1.24)
GFR calc Af Amer: >60 mL/min (ref >60)
GFR calc non Af Amer: >60 mL/min (ref >60)
GLUCOSE: 118 mg/dL — AB (ref 65–99)
POTASSIUM: 4.1 mmol/L (ref 3.5–5.1)
Sodium: 133 mmol/L — ABNORMAL LOW (ref 135–145)

## 2017-03-03 MED ORDER — PANTOPRAZOLE SODIUM 40 MG PO TBEC: 40.0000 mg | DELAYED_RELEASE_TABLET | Freq: Two times a day (BID) | ORAL | 1 refills | Status: DC

## 2017-03-03 MED ORDER — ACETAMINOPHEN 325 MG PO TABS: ORAL_TABLET | ORAL | Status: DC

## 2017-03-03 MED ORDER — MUPIROCIN 2 % EX OINT: TOPICAL_OINTMENT | CUTANEOUS | 0 refills | Status: DC

## 2017-03-03 MED ORDER — OXYCODONE HCL 5 MG PO TABS: 5.0000 mg | ORAL_TABLET | ORAL | 0 refills | Status: DC | PRN

## 2017-03-03 MED ORDER — HYDROCHLOROTHIAZIDE 25 MG PO TABS: ORAL_TABLET | ORAL | Status: DC

## 2017-03-03 NOTE — Discharge Instructions (Signed)
[facility]      [facility] °[phone] ° °OPEN ABDOMINAL SURGERY: POST OP INSTRUCTIONS ° °Always review your discharge instruction sheet given to you by the facility where your surgery was performed. ° °IF YOU HAVE DISABILITY OR FAMILY LEAVE FORMS, YOU MUST BRING THEM TO THE OFFICE FOR PROCESSING.  PLEASE DO NOT GIVE THEM TO YOUR DOCTOR. ° °1. A prescription for pain medication may be given to you upon discharge.  Take your pain medication as prescribed, if needed.  If narcotic pain medicine is not needed, then you may take acetaminophen (Tylenol) or ibuprofen (Advil) as needed. °2. Take your usually prescribed medications unless otherwise directed. °3. If you need a refill on your pain medication, please contact your pharmacy. They will contact our office to request authorization.  Prescriptions will not be filled after 5pm or on week-ends. °4. You should follow a light diet the first few days after arrival home, such as soup and crackers, pudding, etc.unless your doctor has advised otherwise. A high-fiber, low fat diet can be resumed as tolerated.   Be sure to include lots of fluids daily. Most patients will experience some swelling and bruising on the chest and neck area.  Ice packs will help.  Swelling and bruising can take several days to resolve °5. Most patients will experience some swelling and bruising in the area of the incision. Ice pack will help. Swelling and bruising can take several days to resolve..  °6. It is common to experience some constipation if taking pain medication after surgery.  Increasing fluid intake and taking a stool softener will usually help or prevent this problem from occurring.  A mild laxative (Milk of Magnesia or Miralax) should be taken according to package directions if there are no bowel movements after 48 hours. °7.  You may have steri-strips (small skin tapes) in place directly over the incision.  These strips should be left on the skin for 7-10 days.  If your  surgeon used skin glue on the incision, you may shower in 24 hours.  The glue will flake off over the next 2-3 weeks.  Any sutures or staples will be removed at the office during your follow-up visit. You may find that a light gauze bandage over your incision may keep your staples from being rubbed or pulled. You may shower and replace the bandage daily. °8. ACTIVITIES:  You may resume regular (light) daily activities beginning the next day--such as daily self-care, walking, climbing stairs--gradually increasing activities as tolerated.  You may have sexual intercourse when it is comfortable.  Refrain from any heavy lifting or straining until approved by your doctor. °a. You may drive when you no longer are taking prescription pain medication, you can comfortably wear a seatbelt, and you can safely maneuver your car and apply brakes °b. Return to Work: ___________________________________ °9. You should see your doctor in the office for a follow-up appointment approximately two weeks after your surgery.  Make sure that you call for this appointment within a day or two after you arrive home to insure a convenient appointment time. °OTHER INSTRUCTIONS:  °_____________________________________________________________ °_____________________________________________________________ ° °WHEN TO CALL YOUR DOCTOR: °1. Fever over 101.0 °2. Inability to urinate °3. Nausea and/or vomiting °4. Extreme swelling or bruising °5. Continued bleeding from incision. °6. Increased pain, redness, or drainage from the incision. °7. Difficulty swallowing or breathing °8. Muscle cramping or spasms. °9. Numbness or tingling in hands or feet or around lips. ° °The clinic staff is available to   answer your questions during regular business hours.  Please dont hesitate to call and ask to speak to one of the nurses if you have concerns.  For further questions, please visit [email or web address]  Mechanical Wound Debridement  If you  have to take the wound vac down for some reason and pack it wet to dry, you can use this set of instructions. Mechanical wound debridement is a treatment to remove dead tissue from a wound. This helps the wound heal. The treatment involves cleaning the wound (irrigation) and using a pad or gauze (dressing) to remove dead tissue and debris from the wound. There are different types of mechanical wound debridement. Depending on the wound, you may need to repeat this procedure or change to another form of debridement as your wound starts to heal. Tell a health care provider about:  Any allergies you have.  All medicines you are taking, including vitamins, herbs, eye drops, creams, and over-the-counter medicines.  Any blood disorders you have.  Any medical conditions you have, including any conditions that:  Cause a significant decrease in blood circulation to the part of the body where the wound is, such as peripheral vascular disease.  Compromise your defense (immune) system or white blood count.  Any surgeries you have had.  Whether you are pregnant or may be pregnant. What are the risks? Generally, this is a safe procedure. However, problems may occur, including:  Infection.  Bleeding.  Damage to healthy tissue in and around your wound.  Soreness or pain.  Failure of the wound to heal.  Scarring. What happens before the procedure? You may be given antibiotic medicine to help prevent infection. What happens during the procedure?  Your health care provider may apply a numbing medicine (topical anesthetic) to the wound.  Your health care provider will irrigate your wound with a germ-free (sterile), salt-water (saline) solution. This removes debris, bacteria, and dead tissue.  Depending on what type of mechanical wound debridement you are having, your health care provider may do one of the following:  Put a dressing on your wound. You may have dry gauze pad placed into the  wound. Your health care provider will remove the gauze after the wound is dry. Any dead tissue and debris that has dried into the gauze will be lifted out of the wound (wet-to-dry debridement).  Use a type of pad (monofilament fiber debridement pad). This pad has a fluffy surface on one side that picks up dead tissue and debris from your wound. Your health care provider wets the pad and wipes it over your wound for several minutes.  Irrigate your wound with a pressurized stream of solution such as saline or water.  Once your health care provider is finished, he or she may apply a light dressing to your wound. The procedure may vary among health care providers and hospitals. What happens after the procedure?  You may receive medicine for pain.  You will continue to receive antibiotic medicine if it was started before your procedure. This information is not intended to replace advice given to you by your health care provider. Make sure you discuss any questions you have with your health care provider. Document Released: 06/25/2015 Document Revised: 03/11/2016 Document Reviewed: 02/12/2015 Elsevier Interactive Patient Education  2017 [employer].

## 2017-03-03 NOTE — Progress Notes (Signed)
pts wound vac delivered to room and this Probation officer along with charge RN applied new dressings and tubing and verified the vac was working properly. Family at bedside. Reviewed all discharge instructions and prescriptions. Pt will transfer with family.

## 2017-03-03 NOTE — Progress Notes (Deleted)
Contacted [facility], patient has RW and 3n1 so will not need further. [phone]

## 2017-03-03 NOTE — Progress Notes (Signed)
6 Days Post-Op    CC:  Abdominal pain  Subjective: Doing well tolerating diet, and wound looks good.  Pain well controlled and having BM's. He likes the wound vac and would like it at home.  Objective: Vital signs in last 24 hours: Temp:  [97.8 F (36.6 C)-98.3 F (36.8 C)] 97.8 F (36.6 C) (05/17 0603) Pulse Rate:  [61-74] 61 (05/17 0603) Resp:  [15] 15 (05/17 0603) BP: (127-146)/(81-92) 127/92 (05/17 0603) SpO2:  [94 %] 94 % (05/17 0603) Last BM Date: 03/02/17 (Per pt report)  Intake/Output from previous day: 05/16 0701 - 05/17 0700 In: 2118.8 [P.O.:840; I.V.:1178.8; IV Piggyback:100] Out: 1000 [Urine:1000] Intake/Output this shift: Total I/O In: 360 [P.O.:360] Out: 400 [Urine:400]  General appearance: alert, cooperative and no distress Resp: clear to auscultation bilaterally GI: soft, sore, wound looks great.  tolerating diet and having BM's  Lab Results:   Recent Labs  03/01/17 0424 03/02/17 0403  WBC 11.1* 9.7  HGB 12.1* 12.4*  HCT 35.7* 36.5*  PLT 230 235    BMET  Recent Labs  03/02/17 0403 03/03/17 0408  NA 137 133*  K 4.4 4.1  CL 103 99*  CO2 27 26  GLUCOSE 110* 118*  BUN 30* 30*  CREATININE 0.97 0.94  CALCIUM 8.4* 8.3*   PT/INR No results for input(s): LABPROT, INR in the last 72 hours.  No results for input(s): AST, ALT, ALKPHOS, BILITOT, PROT, ALBUMIN in the last 168 hours.   Lipase  No results found for: LIPASE   Medications: . Chlorhexidine Gluconate Cloth  6 each Topical Q0600  . dexamethasone  4 mg Intravenous Q8H  . enoxaparin (LOVENOX) injection  40 mg Subcutaneous Q24H  . lisinopril  10 mg Oral Daily  . mirtazapine  30 mg Oral QHS  . mupirocin ointment  1 application Nasal BID  . pantoprazole  40 mg Oral BID  . silver nitrate applicators  10 Stick Topical Once    Assessment/Plan Perforated pyloric channel ulcer S/p Modified Graham's patch, 02/25/17, Dr. Lurena Joiner Kinsinger  H Pylori is negative Hyponatremia, elevated K+  yesterday Stage IV non small cell lung cancer/Brain metastasis/chemotherapy- Dr. Inda Merlin Hyperglycemia Prediabetes  Hemoglobin A1C is 6.4  Medicine does not feel the need to see. Diabetes  Coordinator consult for education. Follow up with his PCP. Hypertension Hx of depression FEN: IV fluids/full liquids ID: [email or web address] 5/11 =>>day 7 DVT: Lovenox    Plan:  Home today with wound vac. Resume home dose of lisinopril, hold hydrodiuril till he see's PCP.  PPI bid, Follow up with PCP for prediabetes.       LOS: 6 days    Janee Ureste 03/03/2017 [phone]

## 2017-03-08 ENCOUNTER — Other Ambulatory Visit (HOSPITAL_BASED_OUTPATIENT_CLINIC_OR_DEPARTMENT_OTHER): Payer: BLUE CROSS/BLUE SHIELD

## 2017-03-08 ENCOUNTER — Telehealth: Payer: Self-pay | Admitting: Medical Oncology

## 2017-03-08 DIAGNOSIS — C3490 Malignant neoplasm of unspecified part of unspecified bronchus or lung: Secondary | ICD-10-CM | POA: Diagnosis not present

## 2017-03-08 DIAGNOSIS — C7800 Secondary malignant neoplasm of unspecified lung: Secondary | ICD-10-CM

## 2017-03-08 LAB — CBC WITH DIFFERENTIAL/PLATELET
BASO%: 0.4 % (ref 0.0–2.0)
BASOS ABS: 0 10*3/uL (ref 0.0–0.1)
EOS ABS: 0.1 10*3/uL (ref 0.0–0.5)
EOS%: 1.5 % (ref 0.0–7.0)
HCT: 40.5 % (ref 38.4–49.9)
HEMOGLOBIN: 13.7 g/dL (ref 13.0–17.1)
LYMPH#: 1.7 10*3/uL (ref 0.9–3.3)
LYMPH%: 27.8 % (ref 14.0–49.0)
MCH: 29.7 pg (ref 27.2–33.4)
MCHC: 33.8 g/dL (ref 32.0–36.0)
MCV: 87.9 fL (ref 79.3–98.0)
MONO#: 0.4 10*3/uL (ref 0.1–0.9)
MONO%: 6.3 % (ref 0.0–14.0)
NEUT#: 4 10*3/uL (ref 1.5–6.5)
NEUT%: 64 % (ref 39.0–75.0)
PLATELETS: 222 10*3/uL (ref 140–400)
RBC: 4.61 10*6/uL (ref 4.20–5.82)
RDW: 15.2 % — AB (ref 11.0–14.6)
WBC: 6.2 10*3/uL (ref 4.0–10.3)

## 2017-03-08 LAB — COMPREHENSIVE METABOLIC PANEL
ALBUMIN: 2.9 g/dL — AB (ref 3.5–5.0)
ALK PHOS: 53 U/L (ref 40–150)
ALT: 32 U/L (ref 0–55)
AST: 16 U/L (ref 5–34)
Anion Gap: 8 mEq/L (ref 3–11)
BILIRUBIN TOTAL: 0.46 mg/dL (ref 0.20–1.20)
BUN: 15.3 mg/dL (ref 7.0–26.0)
CALCIUM: 8.8 mg/dL (ref 8.4–10.4)
CO2: 29 mEq/L (ref 22–29)
CREATININE: 0.9 mg/dL (ref 0.7–1.3)
Chloride: 100 mEq/L (ref 98–109)
EGFR: >90 mL/min/{1.73_m2} (ref >90)
Glucose: 86 mg/dl (ref 70–140)
Potassium: 4.6 mEq/L (ref 3.5–5.1)
Sodium: 136 mEq/L (ref 136–145)
Total Protein: 6.3 g/dL — ABNORMAL LOW (ref 6.4–8.3)

## 2017-03-08 NOTE — Telephone Encounter (Signed)
Wants labs done local in [place] vs. I told him we will give him a rx for June labs at his next visit.

## 2017-03-08 NOTE — Discharge Summary (Signed)
Physician Discharge Summary  Patient ID: [name] [name] MRN: [id number] DOB/AGE: 11/29/1950 66 y.o.  Admit date: 02/25/2017 Discharge date: 03/03/2017  Admission Diagnoses:  Perforated pyloric channel ulcer Metastatic squamous cell carcinoma of the lung Hypertension  Discharge Diagnoses:  Perforated pyloric channel ulcer H Pylori is negative Hyponatremia, elevated K+ yesterday Stage IV non small cell lung cancer/Brain metastasis/chemotherapy- Dr. Inda Merlin Hyperglycemia Prediabetes Hemoglobin A1C is 6.4 . Hypertension Hx of depression   Principal Problem:   Perforated duodenal ulcer ([place]) s/p exploratory laparotomy and placement of Graham patch 02/25/17 Active Problems:   Metastatic squamous cell carcinoma to lung, unspecified laterality ([place])   PROCEDURES:  S/p Modified Graham's patch, 02/25/17, Dr. Lurena Joiner [facility] Course:  Pt is a 66 year old male with a history of HTN, stage 4 non-small cell lung cancer, squamous cell carcinoma started chemo last week and is followed up Dr. Inda Merlin, oncologist, hx of abnormal MRI of brain and possible embolic strokes seen by neurology 2 days ago (5/9), who presented to an ED in [place], [place] where the pt lives with complaints of abdominal pain. Pt states he started having abdominal pain last night that progressively worsened to severe, non radiating, in his lower abdomen, pain medication received in the ED made it better. Associated fevers. Pt states he has intermitted bleeding from his rectum but last night it was much worse. He states his last BM was a few days ago and was normal. He denies constipation, nausea, vomiting, LOC, dizzines, CP, SOB.  He was seen in the ED and taken to the OR by Dr. Kieth Brightly.  He slowly recovered, he had an open wound that we dressed wet to dry.  As his bowel function returned his diet was advanced.  Glucose was low and we check a hemoglobin A1C and it was elevated to 6.4.  We ask Medicine to see again and  Diabetes coordinator to see.  With education we plan for him to follow up at home, after discharge.  H Pylori was negative. He was ready for discharge, tolerating a soft diet, and had normal bowel function by 03/03/17.   Follow up as listed below.  Condition on D/C:  Improved  CBC Latest Ref Rng & Units 03/08/2017 03/02/2017 03/01/2017  WBC 4.0 - 10.3 10e3/uL 6.2 9.7 11.1(H)  Hemoglobin 13.0 - 17.1 g/dL 13.7 12.4(L) 12.1(L)  Hematocrit 38.4 - 49.9 % 40.5 36.5(L) 35.7(L)  Platelets 140 - 400 10e3/uL 222 235 230    CMP Latest Ref Rng & Units 03/08/2017 03/03/2017 03/02/2017  Glucose 70 - 140 mg/dl 86 118(H) 110(H)  BUN 7.0 - 26.0 mg/dL 15.3 30(H) 30(H)  Creatinine 0.7 - 1.3 mg/dL 0.9 0.94 0.97  Sodium 136 - 145 mEq/L 136 133(L) 137  Potassium 3.5 - 5.1 mEq/L 4.6 4.1 4.4  Chloride 101 - 111 mmol/L - 99(L) 103  CO2 22 - 29 mEq/L '29 26 27  '$ Calcium 8.4 - 10.4 mg/dL 8.8 8.3(L) 8.4(L)  Total Protein 6.4 - 8.3 g/dL 6.3(L) - -  Total Bilirubin 0.20 - 1.20 mg/dL 0.46 - -  Alkaline Phos 40 - 150 U/L 53 - -  AST 5 - 34 U/L 16 - -  ALT 0 - 55 U/L 32 - -   Hgb A1c MFr Bld 4.8 - 5.6 % 6.4    Comment: (NOTE)      Pre-diabetes: 5.7 - 6.4      Diabetes: >6.4      Glycemic control for adults with diabetes: <7.0  H Pylori IgG 0.00 - 0.79 Index Value <0.80   Comment: (NOTE)                Negative      <0.80                Equivocal  0.80 - 0.89                Positive      >0.89     Disposition: 01-Home or Self Care   Allergies as of 03/03/2017      Reactions   No Known Allergies       Medication List    STOP taking these medications   dexamethasone 4 MG tablet Commonly known as:  DECADRON   oxyCODONE-acetaminophen 5-325 MG tablet Commonly known as:  PERCOCET/ROXICET   prochlorperazine 10 MG tablet Commonly known as:  COMPAZINE     TAKE these medications   acetaminophen 325 MG tablet Commonly known as:  TYLENOL You  can take 2 every 4 hours as needed.  You can buy it over the counter DO NOT TAKE MORE THAN 4000 MG OF TYLENOL PER DAY.  IT CAN HARM YOUR LIVER.   aspirin 81 MG chewable tablet Chew 81 mg by mouth daily.   hydrochlorothiazide 25 MG tablet Commonly known as:  HYDRODIURIL Restart this after you see your Primary care doctor.  Check your blood pressure at home and record. What changed:  how much to take  how to take this  when to take this  additional instructions   lisinopril 40 MG tablet Commonly known as:  PRINIVIL,ZESTRIL Take 40 mg by mouth daily.   mirtazapine 30 MG tablet Commonly known as:  REMERON Take 1 tablet (30 mg total) by mouth at bedtime.   mupirocin ointment 2 % Commonly known as:  BACTROBAN Finish up what you have from the hospital   nicotine 21 mg/24hr patch Commonly known as:  NICODERM CQ - dosed in mg/24 hours Place 1 patch (21 mg total) onto the skin daily.   oxyCODONE 5 MG immediate release tablet Commonly known as:  Oxy IR/ROXICODONE Take 1-2 tablets (5-10 mg total) by mouth every 4 (four) hours as needed for moderate pain.   pantoprazole 40 MG tablet Commonly known as:  PROTONIX Take 1 tablet (40 mg total) by mouth 2 (two) times daily.      Follow-up [facility] Follow up.   Why:  nurse to change wound vac Contact information: [phone]       [name], MD. Schedule an appointment as soon as possible for a visit in 2 week(s).   Specialty:  Internal Medicine Why:  Take your blood pressure at home and record.  Call them and let them restart your diuretic blood pressure medicine.  Let them help you with prediabetes. You will also need follow up for the ulcer. Contact information: [address] [place] [address] [facility]        Kinsinger, Arta Bruce, MD Follow up.   Specialty:  General Surgery Why:  [facility] for an appointment in 2 weeks.   Contact information: [place]  [address] [phone]           Signed: Earnstine Regal 03/08/2017, 1:26 PM

## 2017-03-09 ENCOUNTER — Other Ambulatory Visit: Payer: Self-pay

## 2017-03-09 ENCOUNTER — Ambulatory Visit (HOSPITAL_COMMUNITY): Payer: BLUE CROSS/BLUE SHIELD | Attending: Cardiovascular Disease

## 2017-03-09 DIAGNOSIS — R9089 Other abnormal findings on diagnostic imaging of central nervous system: Secondary | ICD-10-CM | POA: Diagnosis not present

## 2017-03-09 DIAGNOSIS — Z720 Tobacco use: Secondary | ICD-10-CM | POA: Diagnosis not present

## 2017-03-09 DIAGNOSIS — C7931 Secondary malignant neoplasm of brain: Secondary | ICD-10-CM | POA: Diagnosis not present

## 2017-03-09 DIAGNOSIS — C7800 Secondary malignant neoplasm of unspecified lung: Secondary | ICD-10-CM | POA: Insufficient documentation

## 2017-03-09 DIAGNOSIS — I63131 Cerebral infarction due to embolism of right carotid artery: Secondary | ICD-10-CM | POA: Insufficient documentation

## 2017-03-11 ENCOUNTER — Telehealth: Payer: Self-pay | Admitting: *Deleted

## 2017-03-11 NOTE — Telephone Encounter (Signed)
Attempted to reach patient re: echocardiogram results. Phone rang continuously, no answering machine. Patient recently in hospital as well. Will try to reach later.

## 2017-03-15 ENCOUNTER — Other Ambulatory Visit: Payer: BLUE CROSS/BLUE SHIELD

## 2017-03-15 NOTE — Telephone Encounter (Signed)
Attempted to reach patient with echocardiogram results. No answer or voice mail

## 2017-03-16 ENCOUNTER — Other Ambulatory Visit (HOSPITAL_BASED_OUTPATIENT_CLINIC_OR_DEPARTMENT_OTHER): Payer: BLUE CROSS/BLUE SHIELD

## 2017-03-16 ENCOUNTER — Other Ambulatory Visit: Payer: Self-pay | Admitting: Medical Oncology

## 2017-03-16 ENCOUNTER — Telehealth: Payer: Self-pay | Admitting: Medical Oncology

## 2017-03-16 ENCOUNTER — Other Ambulatory Visit: Payer: Self-pay | Admitting: Radiation Therapy

## 2017-03-16 ENCOUNTER — Telehealth: Payer: Self-pay | Admitting: *Deleted

## 2017-03-16 ENCOUNTER — Encounter: Payer: Self-pay | Admitting: Pulmonary Disease

## 2017-03-16 ENCOUNTER — Other Ambulatory Visit: Payer: Self-pay | Admitting: Radiation Oncology

## 2017-03-16 ENCOUNTER — Ambulatory Visit (INDEPENDENT_AMBULATORY_CARE_PROVIDER_SITE_OTHER): Payer: BLUE CROSS/BLUE SHIELD | Admitting: Pulmonary Disease

## 2017-03-16 ENCOUNTER — Encounter: Payer: Self-pay | Admitting: *Deleted

## 2017-03-16 VITALS — BP 126/76 | HR 98 | Ht 68.0 in | Wt 205.8 lb

## 2017-03-16 DIAGNOSIS — C799 Secondary malignant neoplasm of unspecified site: Secondary | ICD-10-CM

## 2017-03-16 DIAGNOSIS — C3490 Malignant neoplasm of unspecified part of unspecified bronchus or lung: Secondary | ICD-10-CM

## 2017-03-16 DIAGNOSIS — IMO0002 Reserved for concepts with insufficient information to code with codable children: Secondary | ICD-10-CM

## 2017-03-16 DIAGNOSIS — G4700 Insomnia, unspecified: Secondary | ICD-10-CM

## 2017-03-16 DIAGNOSIS — C7800 Secondary malignant neoplasm of unspecified lung: Secondary | ICD-10-CM

## 2017-03-16 LAB — COMPREHENSIVE METABOLIC PANEL
ALT: 25 U/L (ref 0–55)
ANION GAP: 8 meq/L (ref 3–11)
AST: 17 U/L (ref 5–34)
Albumin: 3 g/dL — ABNORMAL LOW (ref 3.5–5.0)
Alkaline Phosphatase: 63 U/L (ref 40–150)
BILIRUBIN TOTAL: 0.37 mg/dL (ref 0.20–1.20)
BUN: 9.5 mg/dL (ref 7.0–26.0)
CALCIUM: 9.2 mg/dL (ref 8.4–10.4)
CO2: 29 meq/L (ref 22–29)
Chloride: 102 mEq/L (ref 98–109)
Creatinine: 1 mg/dL (ref 0.7–1.3)
EGFR: 83 mL/min/{1.73_m2} — AB (ref >90)
Glucose: 166 mg/dl — ABNORMAL HIGH (ref 70–140)
Potassium: 4.2 mEq/L (ref 3.5–5.1)
Sodium: 139 mEq/L (ref 136–145)
TOTAL PROTEIN: 6.8 g/dL (ref 6.4–8.3)

## 2017-03-16 LAB — CBC WITH DIFFERENTIAL/PLATELET
BASO%: 0.3 % (ref 0.0–2.0)
Basophils Absolute: 0 10*3/uL (ref 0.0–0.1)
EOS ABS: 0.2 10*3/uL (ref 0.0–0.5)
EOS%: 2.9 % (ref 0.0–7.0)
HCT: 40.2 % (ref 38.4–49.9)
HGB: 13.1 g/dL (ref 13.0–17.1)
LYMPH%: 49.9 % — AB (ref 14.0–49.0)
MCH: 29 pg (ref 27.2–33.4)
MCHC: 32.6 g/dL (ref 32.0–36.0)
MCV: 89.1 fL (ref 79.3–98.0)
MONO#: 0.7 10*3/uL (ref 0.1–0.9)
MONO%: 11.1 % (ref 0.0–14.0)
NEUT%: 35.8 % — AB (ref 39.0–75.0)
NEUTROS ABS: 2.1 10*3/uL (ref 1.5–6.5)
PLATELETS: 302 10*3/uL (ref 140–400)
RBC: 4.51 10*6/uL (ref 4.20–5.82)
RDW: 15.6 % — ABNORMAL HIGH (ref 11.0–14.6)
WBC: 6 10*3/uL (ref 4.0–10.3)
lymph#: 3 10*3/uL (ref 0.9–3.3)

## 2017-03-16 MED ORDER — TEMAZEPAM 30 MG PO CAPS: 30.0000 mg | ORAL_CAPSULE | Freq: Every evening | ORAL | 0 refills | Status: DC | PRN

## 2017-03-16 NOTE — Addendum Note (Signed)
Addended by: Ardeen Garland on: 03/16/2017 11:07 AM   Modules accepted: Orders

## 2017-03-16 NOTE — Progress Notes (Signed)
[name] [name]    [id number]    [birth year]  [facility], Costella Hatcher, MD  Referring Physician: No referring provider defined for this encounter.  Chief complaint:   Follow-up for metastatic squamous cell lung cancer Bilateral lung mass with brain metastasis Perforated peptic ulcer  HPI: [name] [name] is a 66 year old with medical history of hypertension. He was treated earlier this year for pneumonia after an abnormal CXR. He had persistent CXR abnormalities after antibiotics which prompted a CT of the chest earlier this month which showed multiple lung masses suspicious for carcinoma. 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 He denies any cough, sputum production, dyspnea, wheezing. He denies any malaise, fatigue, loss of weight, loss of appetite, hemoptysis.  Interim history: He had a navigational bronchoscopy by Dr. Lamonte Sakai on 01/19/17 which showed metastatic squamous cell cancer. He follows with Dr. Earlie Server for chemotherapy. He had an admission this month for perforated duodenal ulcer status post laparoscopic repair and is making a slow recovery. He still has some epigastric discomfort but does not have any discharge, fevers, chills. His chemotherapy is currently on hold and he has a follow-up scheduled in the oncology clinic for reassessment.  He denies any dyspnea, cough, sputum production, hemoptysis, wheezing. He is on albuterol rescue inhaler which she uses 1-2 times a week.  Outpatient Encounter Prescriptions as of 03/16/2017  Medication Sig  . acetaminophen (TYLENOL) 325 MG tablet You can take 2 every 4 hours as needed.  You can buy it over the counter DO NOT TAKE MORE THAN 4000 MG OF TYLENOL PER DAY.  IT CAN HARM YOUR LIVER.  . hydrochlorothiazide (HYDRODIURIL) 25 MG tablet Restart this after you see your Primary care doctor.  Check your blood pressure  at home and record.  Marland Kitchen lisinopril (PRINIVIL,ZESTRIL) 40 MG tablet Take 40 mg by mouth daily.  . mirtazapine (REMERON) 30 MG tablet Take 1 tablet (30 mg total) by mouth at bedtime.  . mupirocin ointment (BACTROBAN) 2 % Finish up what you have from the hospital  . oxyCODONE (OXY IR/ROXICODONE) 5 MG immediate release tablet Take 1-2 tablets (5-10 mg total) by mouth every 4 (four) hours as needed for moderate pain.  . pantoprazole (PROTONIX) 40 MG tablet Take 1 tablet (40 mg total) by mouth 2 (two) times daily.  . temazepam (RESTORIL) 30 MG capsule Take 1 capsule (30 mg total) by mouth at bedtime as needed for sleep.  . [DISCONTINUED] aspirin 81 MG chewable tablet Chew 81 mg by mouth daily.  . [DISCONTINUED] nicotine (NICODERM CQ - DOSED IN MG/24 HOURS) 21 mg/24hr patch Place 1 patch (21 mg total) onto the skin daily. (Patient not taking: Reported on 02/23/2017)   No facility-administered encounter medications on file as of 03/16/2017.     Allergies as of 03/16/2017 - Review Complete 03/16/2017  Allergen Reaction Noted  . No known allergies  01/18/2017    Past Medical History:  Diagnosis Date  . Brain metastases ([place]) 02/15/2017  . Depression 01/29/2017  . Dyspnea   . Encounter for antineoplastic chemotherapy 01/29/2017  . Goals of care, counseling/discussion 01/29/2017  . Hypertension   . Metastatic squamous cell carcinoma to lung, unspecified laterality ([place]) 01/28/2017    Past Surgical History:  Procedure Laterality Date  . BACK SURGERY     cyst removal  . COLONOSCOPY    .  LAPAROTOMY N/A 02/25/2017   Procedure: EXPLORATORY LAPAROTOMY MODIFIED GRAHAM'S PATCH;  Surgeon: Kinsinger, Arta Bruce, MD;  Location: [facility];  Service: General;  Laterality: N/A;  . MINOR PLACEMENT OF FIDUCIAL N/A 01/19/2017   Procedure: MINOR PLACEMENT OF FIDUCIAL x 6;  Surgeon: Collene Gobble, MD;  Location: [facility];  Service: Thoracic;  Laterality: N/A;  . VIDEO BRONCHOSCOPY WITH ENDOBRONCHIAL NAVIGATION N/A 01/19/2017    Procedure: VIDEO BRONCHOSCOPY WITH ENDOBRONCHIAL NAVIGATION;  Surgeon: Collene Gobble, MD;  Location: [facility];  Service: Thoracic;  Laterality: N/A;    Family History  Problem Relation Age of Onset  . Cancer Father        unsure of what type    Social History   Social History  . Marital status: Single    Spouse name: N/A  . Number of children: 0  . Years of education: 10   Occupational History  .      NA   Social History Main Topics  . Smoking status: Current Every Day Smoker    Packs/day: 0.50    Years: 45.00    Types: Cigarettes  . Smokeless tobacco: Never Used  . Alcohol use No  . Drug use: Yes    Types: Marijuana     Comment: daily  . Sexual activity: Not on file   Other Topics Concern  . Not on file   Social History Narrative   Lives alone   Caffeine- coffee, 5 cups daily    Review of systems: Review of Systems  Constitutional: Negative for fever and chills.  HENT: Negative.   Eyes: Negative for blurred vision.  Respiratory: as per HPI  Cardiovascular: Negative for chest pain and palpitations.  Gastrointestinal: Negative for vomiting, diarrhea, blood per rectum. Genitourinary: Negative for dysuria, urgency, frequency and hematuria.  Musculoskeletal: Negative for myalgias, back pain and joint pain.  Skin: Negative for itching and rash.  Neurological: Negative for dizziness, tremors, focal weakness, seizures and loss of consciousness.  Endo/Heme/Allergies: Negative for environmental allergies.  Psychiatric/Behavioral: Negative for depression, suicidal ideas and hallucinations.  All other systems reviewed and are negative.  Physical Exam: Blood pressure 126/76, pulse 98, height 5\' 8"  (1.727 m), weight 205 lb 12.8 oz (93.4 kg), SpO2 93 %. Gen:      No acute distress HEENT:  EOMI, sclera anicteric Neck:     No masses; no thyromegaly Lungs:    Clear to auscultation bilaterally; normal respiratory effort CV:         Regular rate and rhythm; no murmurs Abd:       Epigastric dressing,  no distension Ext:    No edema; adequate peripheral perfusion Skin:      Warm and dry; no rash Neuro: alert and oriented x 3 Psych: normal mood and affect  Data Reviewed: CT chest 12/22/16 3 cm right upper lobes spiculated mass, 1.3 cm right upper lobe spiculated nodule, 2 cm left upper lobe spiculated nodule and 6 mm left upper lobe spiculated nodule.  PET scan 3/97/67 Hypermetabolic 3.2 cm right upper lobe nodule. Bilateral upper lobe lung nodules with PET uptake No lymphadenopathy or distant metastatic disease I have reviewed all images personally.  Surgical pathology 01/19/17  Squamous cell cancer  Assessment:  Metastatic squamous cell cancer Mr. Bracco was seen in our clinic earlier this year for evaluation of lung mass which was shown to be squamous cell cancer or lung biopsy. He follows with Dr. Earlie Server. Chemo on hold given the recent admission for perforated peptic ulcer.  He is doing well from his dyspnea standpoint and is just on albuterol rescue inhaler which he uses 1-2 times a week. I don't believe he needs to be on any long-term controller inhalers. As he has multiple follow-ups with oncology planned and has to drive some distance to get here, I have told him that he does not need to follow up with [place] on a regular basis.   Plan/Recommendations: - Continue albuterol PRN - Return as needed.  Marshell Garfinkel MD [facility] Pager [phone] 03/16/2017, 12:05 PM  CC: No ref. provider found

## 2017-03-16 NOTE — Telephone Encounter (Signed)
Reached patient on mobile number. Informed him that his echocardiogram results show a unremarkable study with no major findings.  Patient verbalized understanding, appreciation.

## 2017-03-16 NOTE — Patient Instructions (Signed)
Continue using albuterol inhaler as needed Please follow-up with Dr. Earlie Server for management of your lung cancer We don't need to you on a regular basis unless your pulmonary symptoms worsen Please call us if you need to be seen back in there pulmonary clinic  Return to clinic as needed

## 2017-03-16 NOTE — Telephone Encounter (Signed)
Per Julien Nordmann I called in Restoril to [place]. I also gave pt significant other a prescription for [facility].

## 2017-03-18 NOTE — Addendum Note (Signed)
Addendum  created 03/18/17 1324 by Rica Koyanagi, MD   Sign clinical note

## 2017-03-21 ENCOUNTER — Telehealth: Payer: Self-pay | Admitting: Medical Oncology

## 2017-03-21 ENCOUNTER — Telehealth: Payer: Self-pay

## 2017-03-21 NOTE — Telephone Encounter (Signed)
Faxed FMLA forms to [employer]

## 2017-03-21 NOTE — Telephone Encounter (Signed)
Pt called asking if his FMLA papers are filled out yet. Explained to pt this does not typically happen in 1 day and that we will call him when they are ready. LVM with Tiffany to call him.

## 2017-03-22 ENCOUNTER — Ambulatory Visit: Payer: BLUE CROSS/BLUE SHIELD | Admitting: Internal Medicine

## 2017-03-22 ENCOUNTER — Ambulatory Visit: Payer: BLUE CROSS/BLUE SHIELD

## 2017-03-22 ENCOUNTER — Other Ambulatory Visit: Payer: BLUE CROSS/BLUE SHIELD

## 2017-03-23 ENCOUNTER — Telehealth: Payer: Self-pay

## 2017-03-23 ENCOUNTER — Telehealth: Payer: Self-pay | Admitting: Internal Medicine

## 2017-03-23 ENCOUNTER — Ambulatory Visit (HOSPITAL_COMMUNITY): Payer: BLUE CROSS/BLUE SHIELD

## 2017-03-23 NOTE — Telephone Encounter (Signed)
S/w pt per the attached message.

## 2017-03-23 NOTE — Telephone Encounter (Signed)
-----   Message from Clancy Gourd sent at 03/23/2017 10:17 AM EDT ----- Please let the patient know that his forms have not been completed and that the turnaround time for FMLA forms to be completed is 7-10 business days. Thanks!

## 2017-03-23 NOTE — Telephone Encounter (Signed)
Pt called asking where FMLA forms are and if they have been faxed to [employer].

## 2017-03-23 NOTE — Telephone Encounter (Signed)
Called patient to confirm appt cancelled and to come in next visit 6/26. Patient is aware.

## 2017-03-24 ENCOUNTER — Telehealth: Payer: Self-pay | Admitting: Medical Oncology

## 2017-03-24 NOTE — Telephone Encounter (Signed)
I returned pt call and left message that I did not know if his insurance will cove lab draws at [facility] and for him to call lab there or his insurance.

## 2017-03-29 ENCOUNTER — Telehealth: Payer: Self-pay | Admitting: Pulmonary Disease

## 2017-03-29 ENCOUNTER — Other Ambulatory Visit: Payer: BLUE CROSS/BLUE SHIELD

## 2017-03-29 NOTE — Telephone Encounter (Signed)
Spoke with pt. He apologized, he meant to call another office. Nothing further was needed.

## 2017-04-05 ENCOUNTER — Other Ambulatory Visit: Payer: BLUE CROSS/BLUE SHIELD

## 2017-04-11 ENCOUNTER — Ambulatory Visit
Admission: RE | Admit: 2017-04-11 | Discharge: 2017-04-11 | Disposition: A | Discharge status: Home / Self Care | Payer: BLUE CROSS/BLUE SHIELD | Source: Ambulatory Visit | Attending: Radiation Oncology | Admitting: Radiation Oncology

## 2017-04-11 ENCOUNTER — Other Ambulatory Visit: Payer: BLUE CROSS/BLUE SHIELD

## 2017-04-11 DIAGNOSIS — C7800 Secondary malignant neoplasm of unspecified lung: Secondary | ICD-10-CM

## 2017-04-11 MED ORDER — GADOBENATE DIMEGLUMINE 529 MG/ML IV SOLN
20.0000 mL | Freq: Once | INTRAVENOUS | Status: AC | PRN
  Administered 2017-04-11: 20 mL via INTRAVENOUS

## 2017-04-12 ENCOUNTER — Encounter: Payer: Self-pay | Admitting: Internal Medicine

## 2017-04-12 ENCOUNTER — Other Ambulatory Visit (HOSPITAL_BASED_OUTPATIENT_CLINIC_OR_DEPARTMENT_OTHER): Payer: BLUE CROSS/BLUE SHIELD

## 2017-04-12 ENCOUNTER — Ambulatory Visit (HOSPITAL_BASED_OUTPATIENT_CLINIC_OR_DEPARTMENT_OTHER): Payer: BLUE CROSS/BLUE SHIELD | Admitting: Internal Medicine

## 2017-04-12 ENCOUNTER — Encounter: Payer: Self-pay | Admitting: *Deleted

## 2017-04-12 ENCOUNTER — Ambulatory Visit (HOSPITAL_BASED_OUTPATIENT_CLINIC_OR_DEPARTMENT_OTHER): Payer: BLUE CROSS/BLUE SHIELD

## 2017-04-12 DIAGNOSIS — Z5111 Encounter for antineoplastic chemotherapy: Secondary | ICD-10-CM

## 2017-04-12 DIAGNOSIS — Z5189 Encounter for other specified aftercare: Secondary | ICD-10-CM

## 2017-04-12 DIAGNOSIS — C3490 Malignant neoplasm of unspecified part of unspecified bronchus or lung: Secondary | ICD-10-CM

## 2017-04-12 DIAGNOSIS — G4700 Insomnia, unspecified: Secondary | ICD-10-CM

## 2017-04-12 DIAGNOSIS — C7800 Secondary malignant neoplasm of unspecified lung: Secondary | ICD-10-CM

## 2017-04-12 LAB — COMPREHENSIVE METABOLIC PANEL
ALT: 13 U/L (ref 0–55)
AST: 14 U/L (ref 5–34)
Albumin: 3.7 g/dL (ref 3.5–5.0)
Alkaline Phosphatase: 62 U/L (ref 40–150)
Anion Gap: 10 mEq/L (ref 3–11)
BUN: 9.6 mg/dL (ref 7.0–26.0)
CO2: 27 meq/L (ref 22–29)
CREATININE: 0.9 mg/dL (ref 0.7–1.3)
Calcium: 9.7 mg/dL (ref 8.4–10.4)
Chloride: 104 mEq/L (ref 98–109)
EGFR: 85 mL/min/{1.73_m2} — ABNORMAL LOW (ref >90)
GLUCOSE: 124 mg/dL (ref 70–140)
Potassium: 3.9 mEq/L (ref 3.5–5.1)
SODIUM: 140 meq/L (ref 136–145)
Total Bilirubin: 0.53 mg/dL (ref 0.20–1.20)
Total Protein: 7.4 g/dL (ref 6.4–8.3)

## 2017-04-12 LAB — CBC WITH DIFFERENTIAL/PLATELET
BASO%: 0.5 % (ref 0.0–2.0)
Basophils Absolute: 0.1 10*3/uL (ref 0.0–0.1)
EOS%: 4.2 % (ref 0.0–7.0)
Eosinophils Absolute: 0.4 10*3/uL (ref 0.0–0.5)
HEMATOCRIT: 42.6 % (ref 38.4–49.9)
HGB: 13.8 g/dL (ref 13.0–17.1)
LYMPH#: 4.8 10*3/uL — AB (ref 0.9–3.3)
LYMPH%: 45.9 % (ref 14.0–49.0)
MCH: 28.6 pg (ref 27.2–33.4)
MCHC: 32.4 g/dL (ref 32.0–36.0)
MCV: 88.2 fL (ref 79.3–98.0)
MONO#: 1 10*3/uL — ABNORMAL HIGH (ref 0.1–0.9)
MONO%: 9.1 % (ref 0.0–14.0)
NEUT#: 4.2 10*3/uL (ref 1.5–6.5)
NEUT%: 40.3 % (ref 39.0–75.0)
Platelets: 227 10*3/uL (ref 140–400)
RBC: 4.83 10*6/uL (ref 4.20–5.82)
RDW: 15.5 % — AB (ref 11.0–14.6)
WBC: 10.5 10*3/uL — ABNORMAL HIGH (ref 4.0–10.3)

## 2017-04-12 MED ORDER — HEPARIN SOD (PORK) LOCK FLUSH 100 UNIT/ML IV SOLN
500.0000 [IU] | Freq: Once | INTRAVENOUS | Status: DC | PRN
  Filled 2017-04-12: qty 5

## 2017-04-12 MED ORDER — TEMAZEPAM 30 MG PO CAPS: 30.0000 mg | ORAL_CAPSULE | Freq: Every evening | ORAL | 0 refills | Status: DC | PRN

## 2017-04-12 MED ORDER — PEGFILGRASTIM 6 MG/0.6ML ~~LOC~~ PSKT
6.0000 mg | PREFILLED_SYRINGE | Freq: Once | SUBCUTANEOUS | Status: AC
  Administered 2017-04-12: 6 mg via SUBCUTANEOUS
  Filled 2017-04-12: qty 0.6

## 2017-04-12 MED ORDER — DEXTROSE 5 % IV SOLN
175.0000 mg/m2 | Freq: Once | INTRAVENOUS | Status: AC
  Administered 2017-04-12: 378 mg via INTRAVENOUS
  Filled 2017-04-12: qty 63

## 2017-04-12 MED ORDER — DIPHENHYDRAMINE HCL 50 MG/ML IJ SOLN
50.0000 mg | Freq: Once | INTRAMUSCULAR | Status: AC
Start: 2017-04-12 — End: 2017-04-12
  Administered 2017-04-12: 50 mg via INTRAVENOUS

## 2017-04-12 MED ORDER — SODIUM CHLORIDE 0.9% FLUSH
10.0000 mL | INTRAVENOUS | Status: DC | PRN
  Filled 2017-04-12: qty 10

## 2017-04-12 MED ORDER — PALONOSETRON HCL INJECTION 0.25 MG/5ML
INTRAVENOUS | Status: AC
  Filled 2017-04-12: qty 5

## 2017-04-12 MED ORDER — SODIUM CHLORIDE 0.9 % IV SOLN
20.0000 mg | Freq: Once | INTRAVENOUS | Status: AC
  Administered 2017-04-12: 20 mg via INTRAVENOUS
  Filled 2017-04-12: qty 2

## 2017-04-12 MED ORDER — CARBOPLATIN CHEMO INJECTION 600 MG/60ML
680.0000 mg | Freq: Once | INTRAVENOUS | Status: AC
  Administered 2017-04-12: 680 mg via INTRAVENOUS
  Filled 2017-04-12: qty 68

## 2017-04-12 MED ORDER — PALONOSETRON HCL INJECTION 0.25 MG/5ML
0.2500 mg | Freq: Once | INTRAVENOUS | Status: AC
  Administered 2017-04-12: 0.25 mg via INTRAVENOUS

## 2017-04-12 MED ORDER — DIPHENHYDRAMINE HCL 50 MG/ML IJ SOLN
INTRAMUSCULAR | Status: AC
  Filled 2017-04-12: qty 1

## 2017-04-12 MED ORDER — OXYCODONE HCL 5 MG PO TABS: 5.0000 mg | ORAL_TABLET | ORAL | 0 refills | Status: DC | PRN

## 2017-04-12 MED ORDER — FAMOTIDINE IN NACL 20-0.9 MG/50ML-% IV SOLN
INTRAVENOUS | Status: AC
  Filled 2017-04-12: qty 50

## 2017-04-12 MED ORDER — SODIUM CHLORIDE 0.9 % IV SOLN
Freq: Once | INTRAVENOUS | Status: AC
  Administered 2017-04-12: 13:00:00 via INTRAVENOUS

## 2017-04-12 MED ORDER — FAMOTIDINE IN NACL 20-0.9 MG/50ML-% IV SOLN
20.0000 mg | Freq: Once | INTRAVENOUS | Status: AC
  Administered 2017-04-12: 20 mg via INTRAVENOUS

## 2017-04-12 NOTE — Patient Instructions (Signed)
[facility] Discharge Instructions for Patients Receiving Chemotherapy  Today you received the following chemotherapy agents Taxol and Carboplatin. To help prevent nausea and vomiting after your treatment, we encourage you to take your nausea medication as directed.  If you develop nausea and vomiting that is not controlled by your nausea medication, call the clinic.   BELOW ARE SYMPTOMS THAT SHOULD BE REPORTED IMMEDIATELY:  *FEVER GREATER THAN 100.5 F  *CHILLS WITH OR WITHOUT FEVER  NAUSEA AND VOMITING THAT IS NOT CONTROLLED WITH YOUR NAUSEA MEDICATION  *UNUSUAL SHORTNESS OF BREATH  *UNUSUAL BRUISING OR BLEEDING  TENDERNESS IN MOUTH AND THROAT WITH OR WITHOUT PRESENCE OF ULCERS  *URINARY PROBLEMS  *BOWEL PROBLEMS  UNUSUAL RASH Items with * indicate a potential emergency and should be followed up as soon as possible.  Feel free to call the clinic you have any questions or concerns. The clinic phone number is [phone].  Please show the CHEMO ALERT CARD at check-in to the Emergency Department and triage nurse.    

## 2017-04-12 NOTE — Progress Notes (Signed)
Oncology Nurse Navigator Documentation  Oncology Nurse Navigator Flowsheets 04/12/2017  Navigator Location [facility]  Navigator Encounter Type Clinic/MDC/spoke with patient and wife at [facility].  Educated patient on side effects of chemo  Patient Visit Type MedOnc  Treatment Phase Treatment  Barriers/Navigation Needs Education  Education Other  Interventions Education  Acuity Level 1  Acuity Level 1 Minimal follow up required  Time Spent with Patient 15

## 2017-04-12 NOTE — Progress Notes (Signed)
[place] [phone]   [phone]  OFFICE PROGRESS NOTE  [name], MD [place] [address]  DIAGNOSIS: Stage IV (T3, N0, M1 a) non-small cell lung cancer, squamous cell carcinoma presented with multiple bilateral pulmonary nodules diagnosed in April 2018.  PRIOR THERAPY: None.  CURRENT THERAPY: Systemic chemotherapy with carboplatin for AUC of 5 and paclitaxel 175 MG/M2 every 3 weeks with Neulasta support.  INTERVAL HISTORY: [name] [name] 66 y.o. male returns to the clinic today for follow-up visit accompanied by his girlfriend. The patient is feeling much better today. He continues to have mild fatigue. He denied having any chest pain, shortness breath, cough or hemoptysis. He has no nausea, vomiting, diarrhea or constipation. He tolerated the first cycle of his treatment with chemotherapy fairly well. His recent MRI of the brain showed no evidence of metastatic disease to the brain but there was evidence for the previous stroke. Here today for evaluation before starting cycle #2.   MEDICAL HISTORY: Past Medical History:  Diagnosis Date  . Brain metastases ([place]) 02/15/2017  . Depression 01/29/2017  . Dyspnea   . Encounter for antineoplastic chemotherapy 01/29/2017  . Goals of care, counseling/discussion 01/29/2017  . Hypertension   . Metastatic squamous cell carcinoma to lung, unspecified laterality ([place]) 01/28/2017    ALLERGIES:  is allergic to no known allergies.  MEDICATIONS:  Current Outpatient Prescriptions  Medication Sig Dispense Refill  . acetaminophen (TYLENOL) 325 MG tablet You can take 2 every 4 hours as needed.  You can buy it over the counter DO NOT TAKE MORE THAN 4000 MG OF TYLENOL PER DAY.  IT CAN HARM YOUR LIVER.    . hydrochlorothiazide (HYDRODIURIL) 25 MG tablet Restart this after you see your Primary care doctor.  Check your blood pressure at home and record.    Marland Kitchen lisinopril (PRINIVIL,ZESTRIL) 40 MG tablet  Take 40 mg by mouth daily.    . mirtazapine (REMERON) 30 MG tablet Take 1 tablet (30 mg total) by mouth at bedtime. 30 tablet 1  . mupirocin ointment (BACTROBAN) 2 % Finish up what you have from the hospital 22 g 0  . oxyCODONE (OXY IR/ROXICODONE) 5 MG immediate release tablet Take 1-2 tablets (5-10 mg total) by mouth every 4 (four) hours as needed for moderate pain. 40 tablet 0  . pantoprazole (PROTONIX) 40 MG tablet Take 1 tablet (40 mg total) by mouth 2 (two) times daily. 60 tablet 1  . temazepam (RESTORIL) 30 MG capsule Take 1 capsule (30 mg total) by mouth at bedtime as needed for sleep. 30 capsule 0   No current facility-administered medications for this visit.     SURGICAL HISTORY:  Past Surgical History:  Procedure Laterality Date  . BACK SURGERY     cyst removal  . COLONOSCOPY    . LAPAROTOMY N/A 02/25/2017   Procedure: EXPLORATORY LAPAROTOMY MODIFIED GRAHAM'S PATCH;  Surgeon: Kinsinger, Arta Bruce, MD;  Location: [facility];  Service: General;  Laterality: N/A;  . MINOR PLACEMENT OF FIDUCIAL N/A 01/19/2017   Procedure: MINOR PLACEMENT OF FIDUCIAL x 6;  Surgeon: Collene Gobble, MD;  Location: [place];  Service: Thoracic;  Laterality: N/A;  . VIDEO BRONCHOSCOPY WITH ENDOBRONCHIAL NAVIGATION N/A 01/19/2017   Procedure: VIDEO BRONCHOSCOPY WITH ENDOBRONCHIAL NAVIGATION;  Surgeon: Collene Gobble, MD;  Location: [place];  Service: Thoracic;  Laterality: N/A;    REVIEW OF SYSTEMS:  A comprehensive review of systems was negative except for:  Constitutional: positive for fatigue   PHYSICAL EXAMINATION: General appearance: alert, cooperative, fatigued and no distress Head: Normocephalic, without obvious abnormality, atraumatic Neck: no adenopathy, no JVD, supple, symmetrical, trachea midline and thyroid not enlarged, symmetric, no tenderness/mass/nodules Lymph nodes: Cervical, supraclavicular, and axillary nodes normal. Resp: clear to auscultation bilaterally Back: symmetric, no curvature. ROM  normal. No CVA tenderness. Cardio: regular rate and rhythm, S1, S2 normal, no murmur, click, rub or gallop GI: soft, non-tender; bowel sounds normal; no masses,  no organomegaly Extremities: extremities normal, atraumatic, no cyanosis or edema  ECOG PERFORMANCE STATUS: 1 - Symptomatic but completely ambulatory  Blood pressure 130/82, pulse 80, temperature 98 F (36.7 C), temperature source Oral, resp. rate 20, height 5\' 8"  (1.727 m), weight 209 lb 6.4 oz (95 kg), SpO2 96 %.  LABORATORY DATA: Lab Results  Component Value Date   WBC 10.5 (H) 04/12/2017   HGB 13.8 04/12/2017   HCT 42.6 04/12/2017   MCV 88.2 04/12/2017   PLT 227 04/12/2017      Chemistry      Component Value Date/Time   NA 140 04/12/2017 1030   K 3.9 04/12/2017 1030   CL 99 (L) 03/03/2017 0408   CO2 27 04/12/2017 1030   BUN 9.6 04/12/2017 1030   CREATININE 0.9 04/12/2017 1030      Component Value Date/Time   CALCIUM 9.7 04/12/2017 1030   ALKPHOS 62 04/12/2017 1030   AST 14 04/12/2017 1030   ALT 13 04/12/2017 1030   BILITOT 0.53 04/12/2017 1030       RADIOGRAPHIC STUDIES: Mr Jeri Cos AT Contrast  Addendum Date: 04/11/2017   ADDENDUM REPORT: 04/11/2017 14:52 ADDENDUM: Study discussed by telephone with MATTHEW MANNING on 04/11/2017 at 14:51 . Electronically Signed   By: Genevie Ann M.D.   On: 04/11/2017 14:52   Result Date: 04/11/2017 CLINICAL DATA:  66 year old male with bilateral lung masses, metastatic squamous cell lung cancer. Possible metastatic disease to the brain, study for stereotactic radiosurgery planning. EXAM: MRI HEAD WITHOUT AND WITH CONTRAST TECHNIQUE: Multiplanar, multiecho pulse sequences of the brain and surrounding structures were obtained without and with intravenous contrast. CONTRAST:  21mL MULTIHANCE GADOBENATE DIMEGLUMINE 529 MG/ML IV SOLN COMPARISON:  Brain MRI 02/08/2017. FINDINGS: Brain: Progressive signal abnormality in the right anterior and superior frontal lobe, but including areas of  developing cortical encephalomalacia (series 7, image 22) which is new since April. Abnormal postcontrast enhancement in the anterior right frontal lobe today is primarily gyriform (see series 12) with interval regression and/or resolution of the largest nodular areas of abnormal enhancement seen in April. Furthermore, some of those previously enhancing areas including that along the surface of the anterior right middle frontal gyrus now demonstrates cortical encephalomalacia (series 7, image 17). Similar areas of gyriform and occasionally nodular diffusion abnormality in the same parenchyma. Comparing with ADC, most of these areas appear subacute. Associated increased patchy and nodular anterior right frontal lobe white matter T2 and FLAIR hyperintensity, and underlying chronically advanced similar bilateral cerebral white matter signal changes. Questionable encephalomalacia also in the anterior inferior right caudate which appears stable. No intracranial mass effect. No abnormal enhancement outside of the anterior right frontal lobe. No dural thickening. No restricted diffusion or evidence of acute infarct outside of the anterior right frontal lobe. No ventriculomegaly. No acute or chronic intracranial hemorrhage identified. Negative pituitary and cervicomedullary junction. Vascular: Major intracranial vascular flow voids are stable, with loss of the right ICA siphon flow void (series 5, images 7, 10). Evidence that flow  is reconstituted at the right ICA terminus similar to the prior study. Skull and upper cervical spine: Negative visualized cervical spine and spinal cord. Visualized bone marrow signal is within normal limits. Sinuses/Orbits: Negative. Other: Mild right mastoid effusion is stable. Negative nasopharynx. Left mastoids are clear. Other Visible internal auditory structures appear normal. Negative scalp soft tissues. IMPRESSION: 1. Evolution of signal abnormality and enhancement in the anterior right  frontal lobe since April is most compatible with subacute and chronic ischemia. And there is evidence of poor flow versus occlusion of the cervical right ICA. 2.  No metastatic disease identified. 3. Underlying advanced but nonspecific cerebral white matter signal changes. Accelerated chronic small vessel disease and/or vasculitis are top differential considerations. Electronically Signed: By: Genevie Ann M.D. On: 04/11/2017 14:26    ASSESSMENT AND PLAN:  This is a very pleasant 66 years old white male with a stage IV non-small cell lung cancer, squamous cell carcinoma presented with multiple bilateral pulmonary nodules. He is currently undergoing systemic chemotherapy with carboplatin and paclitaxel is status post 1 cycle. He tolerated the first cycle of his treatment well with no significant adverse effects. I recommended for the patient to proceed with cycle #2 today as a scheduled. I will see him back for follow-up visit in 3 weeks for evaluation before starting cycle #3. For the history of stroke, he is followed by neurology. I gave the patient refill for Percocet and Restoril today. He was advised to call immediately if he has any concerning symptoms in the interval. The patient voices understanding of current disease status and treatment options and is in agreement with the current care plan. All questions were answered. The patient knows to call the clinic with any problems, questions or concerns. We can certainly see the patient much sooner if necessary.  Disclaimer: This note was dictated with voice recognition software. Similar sounding words can inadvertently be transcribed and may not be corrected upon review.

## 2017-04-13 ENCOUNTER — Ambulatory Visit: Payer: BLUE CROSS/BLUE SHIELD | Admitting: Radiation Oncology

## 2017-04-13 ENCOUNTER — Ambulatory Visit: Payer: BLUE CROSS/BLUE SHIELD

## 2017-04-15 ENCOUNTER — Telehealth: Payer: Self-pay | Admitting: Internal Medicine

## 2017-04-15 NOTE — Telephone Encounter (Signed)
Scheduled appt per 6/26 los - unable to scheduled f/u for 7/16- sent message to MD - will contact patient with f/u is scheduled.

## 2017-04-18 ENCOUNTER — Telehealth: Payer: Self-pay | Admitting: Internal Medicine

## 2017-04-18 NOTE — Telephone Encounter (Signed)
Scheduled appts per 6/26 los - left message and sent reminder letter in the mail.

## 2017-04-27 ENCOUNTER — Telehealth: Payer: Self-pay | Admitting: Internal Medicine

## 2017-04-27 ENCOUNTER — Telehealth: Payer: Self-pay | Admitting: *Deleted

## 2017-04-27 NOTE — Telephone Encounter (Signed)
Schedule message sent to change appt .

## 2017-04-27 NOTE — Telephone Encounter (Signed)
"  I'm scheduled to see Dr. Julien Nordmann this Friday at [place].  That's too early.  It takes me an hour and forty-five minutes to get there.  The earliest I can arrive is perhaps 10:45 am.  Can he see me on 05-03-2017 before treatment?  Return number [phone]."

## 2017-04-27 NOTE — Telephone Encounter (Signed)
SW PT TO CONFIRM APPT TIME CHANGE 7/13 AT 1045 PER [place] MSG

## 2017-04-29 ENCOUNTER — Ambulatory Visit (HOSPITAL_BASED_OUTPATIENT_CLINIC_OR_DEPARTMENT_OTHER): Payer: BLUE CROSS/BLUE SHIELD | Admitting: Internal Medicine

## 2017-04-29 ENCOUNTER — Encounter: Payer: Self-pay | Admitting: Internal Medicine

## 2017-04-29 ENCOUNTER — Telehealth: Payer: Self-pay | Admitting: Internal Medicine

## 2017-04-29 VITALS — BP 156/85 | HR 77 | Temp 98.1°F | Resp 20 | Ht 68.0 in | Wt 213.2 lb

## 2017-04-29 DIAGNOSIS — G629 Polyneuropathy, unspecified: Secondary | ICD-10-CM | POA: Diagnosis not present

## 2017-04-29 DIAGNOSIS — C7931 Secondary malignant neoplasm of brain: Secondary | ICD-10-CM

## 2017-04-29 DIAGNOSIS — C3490 Malignant neoplasm of unspecified part of unspecified bronchus or lung: Secondary | ICD-10-CM | POA: Diagnosis not present

## 2017-04-29 DIAGNOSIS — C7800 Secondary malignant neoplasm of unspecified lung: Secondary | ICD-10-CM

## 2017-04-29 DIAGNOSIS — Z5111 Encounter for antineoplastic chemotherapy: Secondary | ICD-10-CM

## 2017-04-29 NOTE — Progress Notes (Signed)
[place] Telephone:(336) [phone]   Fax:([phone]  OFFICE PROGRESS NOTE  [name], MD [place] [address]  DIAGNOSIS: Stage IV (T3, N0, M1 a) non-small cell lung cancer, squamous cell carcinoma presented with multiple bilateral pulmonary nodules diagnosed in April 2018.  PRIOR THERAPY: None.  CURRENT THERAPY: Systemic chemotherapy with carboplatin for AUC of 5 and paclitaxel 175 MG/M2 every 3 weeks with Neulasta support. Status post 2 cycles.  INTERVAL HISTORY: [name] [name] 66 y.o. male returns to the clinic today for follow-up visit accompanied by his girlfriend. The patient is feeling fine today was no specific complaints. He has been tolerating his systemic chemotherapy fairly well except for mild peripheral neuropathy and he was started recently by neurology on gabapentin. He denied having any chest pain, shortness of breath, cough or hemoptysis. He denied having any fever or chills. He has no nausea, vomiting, diarrhea or constipation. The patient has no significant weight loss or night sweats. He is here today for evaluation before starting cycle #3 next week.   MEDICAL HISTORY: Past Medical History:  Diagnosis Date  . Brain metastases ([place]) 02/15/2017  . Depression 01/29/2017  . Dyspnea   . Encounter for antineoplastic chemotherapy 01/29/2017  . Goals of care, counseling/discussion 01/29/2017  . Hypertension   . Metastatic squamous cell carcinoma to lung, unspecified laterality ([place]) 01/28/2017    ALLERGIES:  is allergic to no known allergies.  MEDICATIONS:  Current Outpatient Prescriptions  Medication Sig Dispense Refill  . acetaminophen (TYLENOL) 325 MG tablet You can take 2 every 4 hours as needed.  You can buy it over the counter DO NOT TAKE MORE THAN 4000 MG OF TYLENOL PER DAY.  IT CAN HARM YOUR LIVER.    Marland Kitchen gabapentin (NEURONTIN) 100 MG capsule Take 100 mg by mouth 2 (two) times daily.  0  . hydrochlorothiazide (HYDRODIURIL) 25  MG tablet Restart this after you see your Primary care doctor.  Check your blood pressure at home and record.    Marland Kitchen lisinopril (PRINIVIL,ZESTRIL) 40 MG tablet Take 40 mg by mouth daily.    . mirtazapine (REMERON) 30 MG tablet Take 1 tablet (30 mg total) by mouth at bedtime. 30 tablet 1  . mupirocin ointment (BACTROBAN) 2 % Finish up what you have from the hospital 22 g 0  . oxyCODONE (OXY IR/ROXICODONE) 5 MG immediate release tablet Take 1-2 tablets (5-10 mg total) by mouth every 4 (four) hours as needed for moderate pain. 40 tablet 0  . pantoprazole (PROTONIX) 40 MG tablet Take 1 tablet (40 mg total) by mouth 2 (two) times daily. 60 tablet 1  . temazepam (RESTORIL) 30 MG capsule Take 1 capsule (30 mg total) by mouth at bedtime as needed for sleep. 30 capsule 0   No current facility-administered medications for this visit.     SURGICAL HISTORY:  Past Surgical History:  Procedure Laterality Date  . BACK SURGERY     cyst removal  . COLONOSCOPY    . LAPAROTOMY N/A 02/25/2017   Procedure: EXPLORATORY LAPAROTOMY MODIFIED GRAHAM'S PATCH;  Surgeon: Kinsinger, Arta Bruce, MD;  Location: [facility];  Service: General;  Laterality: N/A;  . MINOR PLACEMENT OF FIDUCIAL N/A 01/19/2017   Procedure: MINOR PLACEMENT OF FIDUCIAL x 6;  Surgeon: Collene Gobble, MD;  Location: [place];  Service: Thoracic;  Laterality: N/A;  . VIDEO BRONCHOSCOPY WITH ENDOBRONCHIAL NAVIGATION N/A 01/19/2017   Procedure: VIDEO BRONCHOSCOPY WITH ENDOBRONCHIAL NAVIGATION;  Surgeon: Herbie Baltimore  Agustina Caroli, MD;  Location: [facility];  Service: Thoracic;  Laterality: N/A;    REVIEW OF SYSTEMS:  A comprehensive review of systems was negative except for: Neurological: positive for paresthesia   PHYSICAL EXAMINATION: General appearance: alert, cooperative and no distress Head: Normocephalic, without obvious abnormality, atraumatic Neck: no adenopathy, no JVD, supple, symmetrical, trachea midline and thyroid not enlarged, symmetric, no  tenderness/mass/nodules Lymph nodes: Cervical, supraclavicular, and axillary nodes normal. Resp: clear to auscultation bilaterally Back: symmetric, no curvature. ROM normal. No CVA tenderness. Cardio: regular rate and rhythm, S1, S2 normal, no murmur, click, rub or gallop GI: soft, non-tender; bowel sounds normal; no masses,  no organomegaly Extremities: extremities normal, atraumatic, no cyanosis or edema  ECOG PERFORMANCE STATUS: 1 - Symptomatic but completely ambulatory  Blood pressure (!) 156/85, pulse 77, temperature 98.1 F (36.7 C), temperature source Oral, resp. rate 20, height 5\' 8"  (1.727 m), weight 213 lb 3.2 oz (96.7 kg), SpO2 97 %.  LABORATORY DATA: Lab Results  Component Value Date   WBC 10.5 (H) 04/12/2017   HGB 13.8 04/12/2017   HCT 42.6 04/12/2017   MCV 88.2 04/12/2017   PLT 227 04/12/2017      Chemistry      Component Value Date/Time   NA 140 04/12/2017 1030   K 3.9 04/12/2017 1030   CL 99 (L) 03/03/2017 0408   CO2 27 04/12/2017 1030   BUN 9.6 04/12/2017 1030   CREATININE 0.9 04/12/2017 1030      Component Value Date/Time   CALCIUM 9.7 04/12/2017 1030   ALKPHOS 62 04/12/2017 1030   AST 14 04/12/2017 1030   ALT 13 04/12/2017 1030   BILITOT 0.53 04/12/2017 1030       RADIOGRAPHIC STUDIES: Mr Jeri Cos XF Contrast  Addendum Date: 04/11/2017   ADDENDUM REPORT: 04/11/2017 14:52 ADDENDUM: Study discussed by telephone with MATTHEW MANNING on 04/11/2017 at 14:51 . Electronically Signed   By: Genevie Ann M.D.   On: 04/11/2017 14:52   Result Date: 04/11/2017 CLINICAL DATA:  66 year old male with bilateral lung masses, metastatic squamous cell lung cancer. Possible metastatic disease to the brain, study for stereotactic radiosurgery planning. EXAM: MRI HEAD WITHOUT AND WITH CONTRAST TECHNIQUE: Multiplanar, multiecho pulse sequences of the brain and surrounding structures were obtained without and with intravenous contrast. CONTRAST:  81mL MULTIHANCE GADOBENATE  DIMEGLUMINE 529 MG/ML IV SOLN COMPARISON:  Brain MRI 02/08/2017. FINDINGS: Brain: Progressive signal abnormality in the right anterior and superior frontal lobe, but including areas of developing cortical encephalomalacia (series 7, image 22) which is new since April. Abnormal postcontrast enhancement in the anterior right frontal lobe today is primarily gyriform (see series 12) with interval regression and/or resolution of the largest nodular areas of abnormal enhancement seen in April. Furthermore, some of those previously enhancing areas including that along the surface of the anterior right middle frontal gyrus now demonstrates cortical encephalomalacia (series 7, image 17). Similar areas of gyriform and occasionally nodular diffusion abnormality in the same parenchyma. Comparing with ADC, most of these areas appear subacute. Associated increased patchy and nodular anterior right frontal lobe white matter T2 and FLAIR hyperintensity, and underlying chronically advanced similar bilateral cerebral white matter signal changes. Questionable encephalomalacia also in the anterior inferior right caudate which appears stable. No intracranial mass effect. No abnormal enhancement outside of the anterior right frontal lobe. No dural thickening. No restricted diffusion or evidence of acute infarct outside of the anterior right frontal lobe. No ventriculomegaly. No acute or chronic intracranial hemorrhage identified. Negative  pituitary and cervicomedullary junction. Vascular: Major intracranial vascular flow voids are stable, with loss of the right ICA siphon flow void (series 5, images 7, 10). Evidence that flow is reconstituted at the right ICA terminus similar to the prior study. Skull and upper cervical spine: Negative visualized cervical spine and spinal cord. Visualized bone marrow signal is within normal limits. Sinuses/Orbits: Negative. Other: Mild right mastoid effusion is stable. Negative nasopharynx. Left mastoids  are clear. Other Visible internal auditory structures appear normal. Negative scalp soft tissues. IMPRESSION: 1. Evolution of signal abnormality and enhancement in the anterior right frontal lobe since April is most compatible with subacute and chronic ischemia. And there is evidence of poor flow versus occlusion of the cervical right ICA. 2.  No metastatic disease identified. 3. Underlying advanced but nonspecific cerebral white matter signal changes. Accelerated chronic small vessel disease and/or vasculitis are top differential considerations. Electronically Signed: By: Genevie Ann M.D. On: 04/11/2017 14:26    ASSESSMENT AND PLAN:  This is a very pleasant 66 years old white male with stage IV non-small cell lung cancer, squamous cell carcinoma presented with multiple bilateral pulmonary nodules. The patient is currently on systemic chemotherapy with carbo platinum and paclitaxel is status post 2 cycles and he tolerated the second cycle of his treatment fairly well. I recommended for him to proceed with cycle #3 next week as a scheduled. I would see him back for follow-up visit in 3 weeks for evaluation with the start of cycle #4 after repeating CT scan of the chest, abdomen and pelvis for restaging of his disease. For the peripheral neuropathy, he will continue his current treatment with gabapentin. The patient was advised to call immediately if he has any concerning symptoms in the interval. The patient voices understanding of current disease status and treatment options and is in agreement with the current care plan. All questions were answered. The patient knows to call the clinic with any problems, questions or concerns. We can certainly see the patient much sooner if necessary.  Disclaimer: This note was dictated with voice recognition software. Similar sounding words can inadvertently be transcribed and may not be corrected upon review.

## 2017-04-29 NOTE — Telephone Encounter (Signed)
Scheduled appt per 7/13 los - Gave patient AVS and calender per los. - [facility] to contact patient with CT schedule.

## 2017-05-03 ENCOUNTER — Telehealth: Payer: Self-pay | Admitting: Medical Oncology

## 2017-05-03 ENCOUNTER — Ambulatory Visit (HOSPITAL_BASED_OUTPATIENT_CLINIC_OR_DEPARTMENT_OTHER): Payer: BLUE CROSS/BLUE SHIELD

## 2017-05-03 ENCOUNTER — Other Ambulatory Visit (HOSPITAL_BASED_OUTPATIENT_CLINIC_OR_DEPARTMENT_OTHER): Payer: BLUE CROSS/BLUE SHIELD

## 2017-05-03 VITALS — BP 153/97 | HR 73 | Temp 98.2°F | Resp 20

## 2017-05-03 DIAGNOSIS — C7800 Secondary malignant neoplasm of unspecified lung: Secondary | ICD-10-CM

## 2017-05-03 DIAGNOSIS — C3490 Malignant neoplasm of unspecified part of unspecified bronchus or lung: Secondary | ICD-10-CM

## 2017-05-03 DIAGNOSIS — Z5189 Encounter for other specified aftercare: Secondary | ICD-10-CM | POA: Diagnosis not present

## 2017-05-03 DIAGNOSIS — Z5111 Encounter for antineoplastic chemotherapy: Secondary | ICD-10-CM | POA: Diagnosis not present

## 2017-05-03 LAB — COMPREHENSIVE METABOLIC PANEL
ALT: 15 U/L (ref 0–55)
AST: 15 U/L (ref 5–34)
Albumin: 3.8 g/dL (ref 3.5–5.0)
Alkaline Phosphatase: 59 U/L (ref 40–150)
Anion Gap: 8 mEq/L (ref 3–11)
BUN: 11.3 mg/dL (ref 7.0–26.0)
CHLORIDE: 106 meq/L (ref 98–109)
CO2: 24 meq/L (ref 22–29)
Calcium: 9.5 mg/dL (ref 8.4–10.4)
Creatinine: 0.8 mg/dL (ref 0.7–1.3)
EGFR: >90 mL/min/{1.73_m2} (ref >90)
Glucose: 96 mg/dl (ref 70–140)
Potassium: 4.3 mEq/L (ref 3.5–5.1)
SODIUM: 138 meq/L (ref 136–145)
Total Bilirubin: 0.42 mg/dL (ref 0.20–1.20)
Total Protein: 6.9 g/dL (ref 6.4–8.3)

## 2017-05-03 LAB — CBC WITH DIFFERENTIAL/PLATELET
BASO%: 0.3 % (ref 0.0–2.0)
Basophils Absolute: 0 10*3/uL (ref 0.0–0.1)
EOS ABS: 0.1 10*3/uL (ref 0.0–0.5)
EOS%: 0.9 % (ref 0.0–7.0)
HCT: 41.4 % (ref 38.4–49.9)
HGB: 13.6 g/dL (ref 13.0–17.1)
LYMPH%: 45.1 % (ref 14.0–49.0)
MCH: 29.2 pg (ref 27.2–33.4)
MCHC: 32.9 g/dL (ref 32.0–36.0)
MCV: 89 fL (ref 79.3–98.0)
MONO#: 0.9 10*3/uL (ref 0.1–0.9)
MONO%: 8.2 % (ref 0.0–14.0)
NEUT%: 45.5 % (ref 39.0–75.0)
NEUTROS ABS: 5 10*3/uL (ref 1.5–6.5)
PLATELETS: 251 10*3/uL (ref 140–400)
RBC: 4.65 10*6/uL (ref 4.20–5.82)
RDW: 15.9 % — ABNORMAL HIGH (ref 11.0–14.6)
WBC: 10.9 10*3/uL — ABNORMAL HIGH (ref 4.0–10.3)
lymph#: 4.9 10*3/uL — ABNORMAL HIGH (ref 0.9–3.3)

## 2017-05-03 MED ORDER — FAMOTIDINE IN NACL 20-0.9 MG/50ML-% IV SOLN
INTRAVENOUS | Status: AC
  Filled 2017-05-03: qty 50

## 2017-05-03 MED ORDER — FAMOTIDINE IN NACL 20-0.9 MG/50ML-% IV SOLN
20.0000 mg | Freq: Once | INTRAVENOUS | Status: AC
  Administered 2017-05-03: 20 mg via INTRAVENOUS

## 2017-05-03 MED ORDER — PEGFILGRASTIM 6 MG/0.6ML ~~LOC~~ PSKT
6.0000 mg | PREFILLED_SYRINGE | Freq: Once | SUBCUTANEOUS | Status: AC
  Administered 2017-05-03: 6 mg via SUBCUTANEOUS
  Filled 2017-05-03: qty 0.6

## 2017-05-03 MED ORDER — SODIUM CHLORIDE 0.9 % IV SOLN
694.5000 mg | Freq: Once | INTRAVENOUS | Status: AC
  Administered 2017-05-03: 690 mg via INTRAVENOUS
  Filled 2017-05-03: qty 69

## 2017-05-03 MED ORDER — PALONOSETRON HCL INJECTION 0.25 MG/5ML
0.2500 mg | Freq: Once | INTRAVENOUS | Status: AC
  Administered 2017-05-03: 0.25 mg via INTRAVENOUS

## 2017-05-03 MED ORDER — DEXTROSE 5 % IV SOLN
175.0000 mg/m2 | Freq: Once | INTRAVENOUS | Status: AC
  Administered 2017-05-03: 378 mg via INTRAVENOUS
  Filled 2017-05-03: qty 63

## 2017-05-03 MED ORDER — DIPHENHYDRAMINE HCL 50 MG/ML IJ SOLN
INTRAMUSCULAR | Status: AC
  Filled 2017-05-03: qty 1

## 2017-05-03 MED ORDER — DIPHENHYDRAMINE HCL 50 MG/ML IJ SOLN
50.0000 mg | Freq: Once | INTRAMUSCULAR | Status: AC
  Administered 2017-05-03: 50 mg via INTRAVENOUS

## 2017-05-03 MED ORDER — SODIUM CHLORIDE 0.9 % IV SOLN
20.0000 mg | Freq: Once | INTRAVENOUS | Status: AC
  Administered 2017-05-03: 20 mg via INTRAVENOUS
  Filled 2017-05-03: qty 2

## 2017-05-03 MED ORDER — PALONOSETRON HCL INJECTION 0.25 MG/5ML
INTRAVENOUS | Status: AC
  Filled 2017-05-03: qty 5

## 2017-05-03 MED ORDER — SODIUM CHLORIDE 0.9 % IV SOLN
Freq: Once | INTRAVENOUS | Status: AC
  Administered 2017-05-03: 13:00:00 via INTRAVENOUS

## 2017-05-03 NOTE — Patient Instructions (Signed)
[facility] Discharge Instructions for Patients Receiving Chemotherapy  Today you received the following chemotherapy agents Taxol and Carboplatin. To help prevent nausea and vomiting after your treatment, we encourage you to take your nausea medication as directed.  If you develop nausea and vomiting that is not controlled by your nausea medication, call the clinic.   BELOW ARE SYMPTOMS THAT SHOULD BE REPORTED IMMEDIATELY:  *FEVER GREATER THAN 100.5 F  *CHILLS WITH OR WITHOUT FEVER  NAUSEA AND VOMITING THAT IS NOT CONTROLLED WITH YOUR NAUSEA MEDICATION  *UNUSUAL SHORTNESS OF BREATH  *UNUSUAL BRUISING OR BLEEDING  TENDERNESS IN MOUTH AND THROAT WITH OR WITHOUT PRESENCE OF ULCERS  *URINARY PROBLEMS  *BOWEL PROBLEMS  UNUSUAL RASH Items with * indicate a potential emergency and should be followed up as soon as possible.  Feel free to call the clinic you have any questions or concerns. The clinic phone number is [phone].  Please show the CHEMO ALERT CARD at check-in to the Emergency Department and triage nurse.    

## 2017-05-03 NOTE — Telephone Encounter (Signed)
Pt states he lost vision in his right eye yesterday "I went blind" . He saw [facility] and was told he may have had a stroke and was sent home. He stated his vision is back in the right eye and he has no other deficits that he knows of.  Pt wants to know if he should keep his appt. I told him to keep his appt.

## 2017-05-04 ENCOUNTER — Telehealth: Payer: Self-pay | Admitting: *Deleted

## 2017-05-04 NOTE — Telephone Encounter (Signed)
Patient states he was seen by an "eye doctor" in [place], [place].  the dr mentioned something about seeing a neurologist? He was given dr Worthy Flank fax #. He will have other physician fax office visit note for dr mohamed's review and guidance in this matter. Desk nurse notified.

## 2017-05-05 ENCOUNTER — Telehealth: Payer: Self-pay | Admitting: Medical Oncology

## 2017-05-05 NOTE — Telephone Encounter (Signed)
I returned pt call and told him to f/u with PCP regarding episode of temp loss of vision per opthalmology recommendation. I instructed him to keep f/u with [facility] as scheduled.

## 2017-05-11 ENCOUNTER — Other Ambulatory Visit: Payer: Self-pay | Admitting: Medical Oncology

## 2017-05-12 ENCOUNTER — Other Ambulatory Visit: Payer: Self-pay | Admitting: Medical Oncology

## 2017-05-12 ENCOUNTER — Other Ambulatory Visit: Payer: Self-pay | Admitting: Oncology

## 2017-05-12 ENCOUNTER — Telehealth: Payer: Self-pay

## 2017-05-12 ENCOUNTER — Telehealth: Payer: Self-pay | Admitting: Medical Oncology

## 2017-05-12 DIAGNOSIS — C7800 Secondary malignant neoplasm of unspecified lung: Secondary | ICD-10-CM

## 2017-05-12 MED ORDER — PANTOPRAZOLE SODIUM 40 MG PO TBEC: 40.0000 mg | DELAYED_RELEASE_TABLET | Freq: Two times a day (BID) | ORAL | 1 refills | Status: DC

## 2017-05-12 MED ORDER — OXYCODONE HCL 5 MG PO TABS: 5.0000 mg | ORAL_TABLET | ORAL | 0 refills | Status: DC | PRN

## 2017-05-12 NOTE — Telephone Encounter (Signed)
protonix and restoril called to pharmacy. Will consult with Patriciaann Clan, NP re Percocet refill.

## 2017-05-12 NOTE — Telephone Encounter (Signed)
Pt is requesting a oxycodone refill, also the restoril and protonix. The protonix was originally rx by Dr Gurney Maxin of [facility]. He has a 2 hour drive to get to [facility].

## 2017-05-20 ENCOUNTER — Ambulatory Visit (HOSPITAL_COMMUNITY)
Admission: RE | Admit: 2017-05-20 | Discharge: 2017-05-20 | Disposition: A | Discharge status: Home / Self Care | Payer: BLUE CROSS/BLUE SHIELD | Source: Ambulatory Visit | Attending: Internal Medicine | Admitting: Internal Medicine

## 2017-05-20 ENCOUNTER — Ambulatory Visit (HOSPITAL_COMMUNITY): Payer: BLUE CROSS/BLUE SHIELD

## 2017-05-20 DIAGNOSIS — R918 Other nonspecific abnormal finding of lung field: Secondary | ICD-10-CM | POA: Diagnosis not present

## 2017-05-20 DIAGNOSIS — Z5111 Encounter for antineoplastic chemotherapy: Secondary | ICD-10-CM | POA: Insufficient documentation

## 2017-05-20 DIAGNOSIS — I700 Atherosclerosis of aorta: Secondary | ICD-10-CM | POA: Insufficient documentation

## 2017-05-20 DIAGNOSIS — C7931 Secondary malignant neoplasm of brain: Secondary | ICD-10-CM | POA: Insufficient documentation

## 2017-05-20 DIAGNOSIS — I712 Thoracic aortic aneurysm, without rupture: Secondary | ICD-10-CM | POA: Diagnosis not present

## 2017-05-20 DIAGNOSIS — J438 Other emphysema: Secondary | ICD-10-CM | POA: Diagnosis not present

## 2017-05-20 DIAGNOSIS — C7800 Secondary malignant neoplasm of unspecified lung: Secondary | ICD-10-CM | POA: Insufficient documentation

## 2017-05-20 MED ORDER — IOPAMIDOL (ISOVUE-300) INJECTION 61%
INTRAVENOUS | Status: AC
  Filled 2017-05-20: qty 30

## 2017-05-20 MED ORDER — IOPAMIDOL (ISOVUE-300) INJECTION 61%
100.0000 mL | Freq: Once | INTRAVENOUS | Status: AC | PRN
  Administered 2017-05-20: 100 mL via INTRAVENOUS

## 2017-05-20 MED ORDER — IOPAMIDOL (ISOVUE-300) INJECTION 61%
30.0000 mL | Freq: Once | INTRAVENOUS | Status: AC | PRN
Start: 2017-05-20 — End: 2017-05-20
  Administered 2017-05-20: 30 mL via INTRAVENOUS

## 2017-05-20 MED ORDER — IOPAMIDOL (ISOVUE-300) INJECTION 61%
INTRAVENOUS | Status: AC
  Filled 2017-05-20: qty 100

## 2017-05-24 ENCOUNTER — Ambulatory Visit (HOSPITAL_BASED_OUTPATIENT_CLINIC_OR_DEPARTMENT_OTHER): Payer: BLUE CROSS/BLUE SHIELD | Admitting: Internal Medicine

## 2017-05-24 ENCOUNTER — Other Ambulatory Visit (HOSPITAL_BASED_OUTPATIENT_CLINIC_OR_DEPARTMENT_OTHER): Payer: BLUE CROSS/BLUE SHIELD

## 2017-05-24 ENCOUNTER — Encounter: Payer: Self-pay | Admitting: Internal Medicine

## 2017-05-24 ENCOUNTER — Ambulatory Visit (HOSPITAL_BASED_OUTPATIENT_CLINIC_OR_DEPARTMENT_OTHER): Payer: BLUE CROSS/BLUE SHIELD

## 2017-05-24 ENCOUNTER — Telehealth: Payer: Self-pay | Admitting: Internal Medicine

## 2017-05-24 VITALS — BP 141/99 | HR 86 | Temp 97.8°F | Resp 18 | Ht 68.0 in | Wt 212.2 lb

## 2017-05-24 DIAGNOSIS — G4709 Other insomnia: Secondary | ICD-10-CM

## 2017-05-24 DIAGNOSIS — C7800 Secondary malignant neoplasm of unspecified lung: Secondary | ICD-10-CM

## 2017-05-24 DIAGNOSIS — I10 Essential (primary) hypertension: Secondary | ICD-10-CM | POA: Insufficient documentation

## 2017-05-24 DIAGNOSIS — G4700 Insomnia, unspecified: Secondary | ICD-10-CM | POA: Diagnosis not present

## 2017-05-24 DIAGNOSIS — G620 Drug-induced polyneuropathy: Secondary | ICD-10-CM

## 2017-05-24 DIAGNOSIS — Z5111 Encounter for antineoplastic chemotherapy: Secondary | ICD-10-CM | POA: Diagnosis not present

## 2017-05-24 DIAGNOSIS — C3490 Malignant neoplasm of unspecified part of unspecified bronchus or lung: Secondary | ICD-10-CM

## 2017-05-24 DIAGNOSIS — Z5189 Encounter for other specified aftercare: Secondary | ICD-10-CM

## 2017-05-24 LAB — COMPREHENSIVE METABOLIC PANEL
ALBUMIN: 3.8 g/dL (ref 3.5–5.0)
ALT: 13 U/L (ref 0–55)
AST: 15 U/L (ref 5–34)
Alkaline Phosphatase: 63 U/L (ref 40–150)
Anion Gap: 8 mEq/L (ref 3–11)
BILIRUBIN TOTAL: 0.43 mg/dL (ref 0.20–1.20)
BUN: 14 mg/dL (ref 7.0–26.0)
CALCIUM: 9.3 mg/dL (ref 8.4–10.4)
CO2: 25 meq/L (ref 22–29)
Chloride: 103 mEq/L (ref 98–109)
Creatinine: 0.9 mg/dL (ref 0.7–1.3)
GLUCOSE: 156 mg/dL — AB (ref 70–140)
POTASSIUM: 4.2 meq/L (ref 3.5–5.1)
SODIUM: 136 meq/L (ref 136–145)
TOTAL PROTEIN: 7.1 g/dL (ref 6.4–8.3)

## 2017-05-24 LAB — CBC WITH DIFFERENTIAL/PLATELET
BASO%: 0.3 % (ref 0.0–2.0)
BASOS ABS: 0 10*3/uL (ref 0.0–0.1)
EOS ABS: 0.1 10*3/uL (ref 0.0–0.5)
EOS%: 0.7 % (ref 0.0–7.0)
HCT: 41 % (ref 38.4–49.9)
HEMOGLOBIN: 13.2 g/dL (ref 13.0–17.1)
LYMPH%: 40.2 % (ref 14.0–49.0)
MCH: 29.5 pg (ref 27.2–33.4)
MCHC: 32.2 g/dL (ref 32.0–36.0)
MCV: 91.7 fL (ref 79.3–98.0)
MONO#: 0.7 10*3/uL (ref 0.1–0.9)
MONO%: 6.9 % (ref 0.0–14.0)
NEUT#: 4.9 10*3/uL (ref 1.5–6.5)
NEUT%: 51.9 % (ref 39.0–75.0)
PLATELETS: 209 10*3/uL (ref 140–400)
RBC: 4.47 10*6/uL (ref 4.20–5.82)
RDW: 16.5 % — AB (ref 11.0–14.6)
WBC: 9.5 10*3/uL (ref 4.0–10.3)
lymph#: 3.8 10*3/uL — ABNORMAL HIGH (ref 0.9–3.3)

## 2017-05-24 MED ORDER — FAMOTIDINE IN NACL 20-0.9 MG/50ML-% IV SOLN
INTRAVENOUS | Status: AC
  Filled 2017-05-24: qty 50

## 2017-05-24 MED ORDER — PALONOSETRON HCL INJECTION 0.25 MG/5ML
0.2500 mg | Freq: Once | INTRAVENOUS | Status: AC
  Administered 2017-05-24: 0.25 mg via INTRAVENOUS

## 2017-05-24 MED ORDER — DIPHENHYDRAMINE HCL 50 MG/ML IJ SOLN
INTRAMUSCULAR | Status: AC
  Filled 2017-05-24: qty 1

## 2017-05-24 MED ORDER — SODIUM CHLORIDE 0.9 % IV SOLN
694.5000 mg | Freq: Once | INTRAVENOUS | Status: AC
  Administered 2017-05-24: 690 mg via INTRAVENOUS
  Filled 2017-05-24: qty 69

## 2017-05-24 MED ORDER — SODIUM CHLORIDE 0.9 % IV SOLN
Freq: Once | INTRAVENOUS | Status: AC
  Administered 2017-05-24: 13:00:00 via INTRAVENOUS

## 2017-05-24 MED ORDER — PALONOSETRON HCL INJECTION 0.25 MG/5ML
INTRAVENOUS | Status: AC
  Filled 2017-05-24: qty 5

## 2017-05-24 MED ORDER — FAMOTIDINE IN NACL 20-0.9 MG/50ML-% IV SOLN
20.0000 mg | Freq: Once | INTRAVENOUS | Status: AC
  Administered 2017-05-24: 20 mg via INTRAVENOUS

## 2017-05-24 MED ORDER — SODIUM CHLORIDE 0.9 % IV SOLN
20.0000 mg | Freq: Once | INTRAVENOUS | Status: AC
  Administered 2017-05-24: 20 mg via INTRAVENOUS
  Filled 2017-05-24: qty 2

## 2017-05-24 MED ORDER — SODIUM CHLORIDE 0.9 % IV SOLN
175.0000 mg/m2 | Freq: Once | INTRAVENOUS | Status: AC
  Administered 2017-05-24: 378 mg via INTRAVENOUS
  Filled 2017-05-24: qty 63

## 2017-05-24 MED ORDER — PEGFILGRASTIM 6 MG/0.6ML ~~LOC~~ PSKT
6.0000 mg | PREFILLED_SYRINGE | Freq: Once | SUBCUTANEOUS | Status: AC
  Administered 2017-05-24: 6 mg via SUBCUTANEOUS
  Filled 2017-05-24: qty 0.6

## 2017-05-24 MED ORDER — DIPHENHYDRAMINE HCL 50 MG/ML IJ SOLN
50.0000 mg | Freq: Once | INTRAMUSCULAR | Status: AC
Start: 2017-05-24 — End: 2017-05-24
  Administered 2017-05-24: 50 mg via INTRAVENOUS

## 2017-05-24 NOTE — Telephone Encounter (Signed)
Appts already scheduled per 8/7 los - Chemotherapy and follow-up visit every 3 weeks. Next chemotherapy should be scheduled to be on 06/14/2017. Please cancel any other earlier chemotherapy appointment. Weekly lab during chemotherapy.   no additional appts added or cancelled - per patient said that they get labs done in [place] - MD aware.

## 2017-05-24 NOTE — Patient Instructions (Signed)
[facility] Discharge Instructions for Patients Receiving Chemotherapy  Today you received the following chemotherapy agents Taxol and Carboplatin. To help prevent nausea and vomiting after your treatment, we encourage you to take your nausea medication as directed.  If you develop nausea and vomiting that is not controlled by your nausea medication, call the clinic.   BELOW ARE SYMPTOMS THAT SHOULD BE REPORTED IMMEDIATELY:  *FEVER GREATER THAN 100.5 F  *CHILLS WITH OR WITHOUT FEVER  NAUSEA AND VOMITING THAT IS NOT CONTROLLED WITH YOUR NAUSEA MEDICATION  *UNUSUAL SHORTNESS OF BREATH  *UNUSUAL BRUISING OR BLEEDING  TENDERNESS IN MOUTH AND THROAT WITH OR WITHOUT PRESENCE OF ULCERS  *URINARY PROBLEMS  *BOWEL PROBLEMS  UNUSUAL RASH Items with * indicate a potential emergency and should be followed up as soon as possible.  Feel free to call the clinic you have any questions or concerns. The clinic phone number is [phone].  Please show the CHEMO ALERT CARD at check-in to the Emergency Department and triage nurse.    

## 2017-05-24 NOTE — Progress Notes (Signed)
[facility] [phone]   [phone]  OFFICE PROGRESS NOTE  [name], MD [place] [address]  DIAGNOSIS: Stage IV (T3, N0, M1 a) non-small cell lung cancer, squamous cell carcinoma presented with multiple bilateral pulmonary nodules diagnosed in April 2018.  PRIOR THERAPY: None.  CURRENT THERAPY: Systemic chemotherapy with carboplatin for AUC of 5 and paclitaxel 175 MG/M2 every 3 weeks with Neulasta support. Status post 3 cycles.  INTERVAL HISTORY: [name] [name] 66 y.o. male returns to the clinic today for follow-up visit accompanied by his girlfriend. The patient is feeling fine today was no specific complaints except for mild peripheral neuropathy in his feet. He denied having any chest pain, shortness breath, cough or hemoptysis. He has no nausea, vomiting, diarrhea or constipation. He denied having any significant weight loss or night sweats. Some issues with insomnia and he is currently on Restoril 30 mg by mouth daily at bedtime. Has been tolerating his current systemic chemotherapy fairly well. The patient had repeat CT scan of the chest performed recently and he is here for evaluation and discussion of his scan results.   MEDICAL HISTORY: Past Medical History:  Diagnosis Date  . Brain metastases ([place]) 02/15/2017  . Depression 01/29/2017  . Dyspnea   . Encounter for antineoplastic chemotherapy 01/29/2017  . Goals of care, counseling/discussion 01/29/2017  . Hypertension   . Metastatic squamous cell carcinoma to lung, unspecified laterality ([place]) 01/28/2017    ALLERGIES:  is allergic to no known allergies.  MEDICATIONS:  Current Outpatient Prescriptions  Medication Sig Dispense Refill  . acetaminophen (TYLENOL) 325 MG tablet You can take 2 every 4 hours as needed.  You can buy it over the counter DO NOT TAKE MORE THAN 4000 MG OF TYLENOL PER DAY.  IT CAN HARM YOUR LIVER.    Marland Kitchen gabapentin (NEURONTIN) 100 MG capsule Take 100 mg  by mouth 2 (two) times daily.  0  . hydrochlorothiazide (HYDRODIURIL) 25 MG tablet Restart this after you see your Primary care doctor.  Check your blood pressure at home and record.    Marland Kitchen lisinopril (PRINIVIL,ZESTRIL) 40 MG tablet Take 40 mg by mouth daily.    . mirtazapine (REMERON) 30 MG tablet Take 1 tablet (30 mg total) by mouth at bedtime. 30 tablet 1  . mupirocin ointment (BACTROBAN) 2 % Finish up what you have from the hospital 22 g 0  . oxyCODONE (OXY IR/ROXICODONE) 5 MG immediate release tablet Take 1-2 tablets (5-10 mg total) by mouth every 4 (four) hours as needed for moderate pain. 40 tablet 0  . pantoprazole (PROTONIX) 40 MG tablet Take 1 tablet (40 mg total) by mouth 2 (two) times daily. 60 tablet 1  . temazepam (RESTORIL) 30 MG capsule Take 1 capsule (30 mg total) by mouth at bedtime as needed for sleep. 30 capsule 0   No current facility-administered medications for this visit.     SURGICAL HISTORY:  Past Surgical History:  Procedure Laterality Date  . BACK SURGERY     cyst removal  . COLONOSCOPY    . LAPAROTOMY N/A 02/25/2017   Procedure: EXPLORATORY LAPAROTOMY MODIFIED GRAHAM'S PATCH;  Surgeon: Kinsinger, Arta Bruce, MD;  Location: [facility];  Service: General;  Laterality: N/A;  . MINOR PLACEMENT OF FIDUCIAL N/A 01/19/2017   Procedure: MINOR PLACEMENT OF FIDUCIAL x 6;  Surgeon: Collene Gobble, MD;  Location: [facility];  Service: Thoracic;  Laterality: N/A;  . VIDEO BRONCHOSCOPY WITH ENDOBRONCHIAL  NAVIGATION N/A 01/19/2017   Procedure: VIDEO BRONCHOSCOPY WITH ENDOBRONCHIAL NAVIGATION;  Surgeon: Collene Gobble, MD;  Location: [facility];  Service: Thoracic;  Laterality: N/A;    REVIEW OF SYSTEMS:  Constitutional: positive for fatigue Eyes: negative Ears, nose, mouth, throat, and face: negative Respiratory: negative Cardiovascular: negative Gastrointestinal: negative Genitourinary:negative Integument/breast: negative Hematologic/lymphatic:  negative Musculoskeletal:negative Neurological: positive for paresthesia Behavioral/Psych: positive for sleep disturbance Endocrine: negative Allergic/Immunologic: negative   PHYSICAL EXAMINATION: General appearance: alert, cooperative, fatigued and no distress Head: Normocephalic, without obvious abnormality, atraumatic Neck: no adenopathy, no JVD, supple, symmetrical, trachea midline and thyroid not enlarged, symmetric, no tenderness/mass/nodules Lymph nodes: Cervical, supraclavicular, and axillary nodes normal. Resp: clear to auscultation bilaterally Back: symmetric, no curvature. ROM normal. No CVA tenderness. Cardio: regular rate and rhythm, S1, S2 normal, no murmur, click, rub or gallop GI: soft, non-tender; bowel sounds normal; no masses,  no organomegaly Extremities: extremities normal, atraumatic, no cyanosis or edema Neurologic: Alert and oriented X 3, normal strength and tone. Normal symmetric reflexes. Normal coordination and gait  ECOG PERFORMANCE STATUS: 1 - Symptomatic but completely ambulatory  Blood pressure (!) 141/99, pulse 86, temperature 97.8 F (36.6 C), temperature source Oral, resp. rate 18, height 5\' 8"  (1.727 m), weight 212 lb 3.2 oz (96.3 kg), SpO2 96 %.  LABORATORY DATA: Lab Results  Component Value Date   WBC 10.9 (H) 05/03/2017   HGB 13.6 05/03/2017   HCT 41.4 05/03/2017   MCV 89.0 05/03/2017   PLT 251 05/03/2017      Chemistry      Component Value Date/Time   NA 138 05/03/2017 1119   K 4.3 05/03/2017 1119   CL 99 (L) 03/03/2017 0408   CO2 24 05/03/2017 1119   BUN 11.3 05/03/2017 1119   CREATININE 0.8 05/03/2017 1119      Component Value Date/Time   CALCIUM 9.5 05/03/2017 1119   ALKPHOS 59 05/03/2017 1119   AST 15 05/03/2017 1119   ALT 15 05/03/2017 1119   BILITOT 0.42 05/03/2017 1119       RADIOGRAPHIC STUDIES: Ct Chest W Contrast  Result Date: 05/20/2017 CLINICAL DATA:  Stage IV lung cancer.  Restaging. EXAM: CT CHEST, ABDOMEN,  AND PELVIS WITH CONTRAST TECHNIQUE: Multidetector CT imaging of the chest, abdomen and pelvis was performed following the standard protocol during bolus administration of intravenous contrast. CONTRAST:  133mL ISOVUE-300 IOPAMIDOL (ISOVUE-300) INJECTION 61%, 61mL ISOVUE-300 IOPAMIDOL (ISOVUE-300) INJECTION 61% COMPARISON:  None. FINDINGS: CT CHEST FINDINGS Cardiovascular: There is aortic atherosclerosis noted. Normal heart size. No pericardial effusion. Mediastinum/Nodes: No enlarged mediastinal, hilar, or axillary lymph nodes. Thyroid gland, trachea, and esophagus demonstrate no significant findings. Lungs/Pleura: No pleural effusion. Moderate changes of paraseptal emphysema. The spiculated index nodule located in the posterolateral right upper lobe measures 1.4 cm, image 47 of series 4. Unchanged from previous exam. The central right upper lobe nodule measures 1.5 cm, image 76 of series 4. Previously 2.2 cm. Left upper lobe nodule Measures 2.0 x 1.3 cm, image 76 of series 4. Previous 2.0 x 1.5 cm peer the subpleural nodule within the left lower lobe measures 4 mm, image 108 of series 4. Previously 4 mm. Left upper lobe lung nodule measures 6 mm, image 66 of series 4. Previously this measured the same. Musculoskeletal: There are no aggressive lytic or sclerotic bone lesions identified. CT ABDOMEN PELVIS FINDINGS Hepatobiliary: There is no focal liver abnormality identified. The gallbladder appears within normal limits. No biliary dilatation. Pancreas: Unremarkable. No pancreatic ductal dilatation or surrounding inflammatory  changes. Spleen: Normal in size without focal abnormality. Adrenals/Urinary Tract: Low-attenuation enlargement of the adrenal glands compatible with underlying adenomas. Unchanged from previous exam. The kidneys are unremarkable. No mass or hydronephrosis. Urinary bladder appears normal. Stomach/Bowel: The stomach is normal. The small bowel loops have a normal course and caliber. The appendix is  visualized and appears normal. Numerous colonic diverticula identified. No acute inflammation. Vascular/Lymphatic: Aortic atherosclerosis. Infrarenal abdominal aorta measures 3.1 cm, image 80 of series 2. No adenopathy identified within the abdomen or pelvis. Reproductive: The prostate is unremarkable. Other: There is no ascites or focal fluid collections within the abdomen or pelvis. Musculoskeletal: No aggressive lytic or sclerotic bone lesions. IMPRESSION: 1. Bilateral pulmonary nodules are stable to decreased in size from previous exam compatible with response to therapy. No new or progressive disease identified. 2. Aortic Atherosclerosis (ICD10-I70.0) and Emphysema (ICD10-J43.9). 3. Aortic aneurysm NOS (ICD10-I71.9). Recommend followup by ultrasound in 3 years. This recommendation follows ACR consensus guidelines: White Paper of the ACR Incidental Findings Committee II on Vascular Findings. [facility] Radiol 2013; 10:789-794 Electronically Signed   By: Kerby Moors M.D.   On: 05/20/2017 14:57   Ct Abdomen Pelvis W Contrast  Result Date: 05/20/2017 CLINICAL DATA:  Stage IV lung cancer.  Restaging. EXAM: CT CHEST, ABDOMEN, AND PELVIS WITH CONTRAST TECHNIQUE: Multidetector CT imaging of the chest, abdomen and pelvis was performed following the standard protocol during bolus administration of intravenous contrast. CONTRAST:  170mL ISOVUE-300 IOPAMIDOL (ISOVUE-300) INJECTION 61%, 18mL ISOVUE-300 IOPAMIDOL (ISOVUE-300) INJECTION 61% COMPARISON:  None. FINDINGS: CT CHEST FINDINGS Cardiovascular: There is aortic atherosclerosis noted. Normal heart size. No pericardial effusion. Mediastinum/Nodes: No enlarged mediastinal, hilar, or axillary lymph nodes. Thyroid gland, trachea, and esophagus demonstrate no significant findings. Lungs/Pleura: No pleural effusion. Moderate changes of paraseptal emphysema. The spiculated index nodule located in the posterolateral right upper lobe measures 1.4 cm, image 47 of series 4.  Unchanged from previous exam. The central right upper lobe nodule measures 1.5 cm, image 76 of series 4. Previously 2.2 cm. Left upper lobe nodule Measures 2.0 x 1.3 cm, image 76 of series 4. Previous 2.0 x 1.5 cm peer the subpleural nodule within the left lower lobe measures 4 mm, image 108 of series 4. Previously 4 mm. Left upper lobe lung nodule measures 6 mm, image 66 of series 4. Previously this measured the same. Musculoskeletal: There are no aggressive lytic or sclerotic bone lesions identified. CT ABDOMEN PELVIS FINDINGS Hepatobiliary: There is no focal liver abnormality identified. The gallbladder appears within normal limits. No biliary dilatation. Pancreas: Unremarkable. No pancreatic ductal dilatation or surrounding inflammatory changes. Spleen: Normal in size without focal abnormality. Adrenals/Urinary Tract: Low-attenuation enlargement of the adrenal glands compatible with underlying adenomas. Unchanged from previous exam. The kidneys are unremarkable. No mass or hydronephrosis. Urinary bladder appears normal. Stomach/Bowel: The stomach is normal. The small bowel loops have a normal course and caliber. The appendix is visualized and appears normal. Numerous colonic diverticula identified. No acute inflammation. Vascular/Lymphatic: Aortic atherosclerosis. Infrarenal abdominal aorta measures 3.1 cm, image 80 of series 2. No adenopathy identified within the abdomen or pelvis. Reproductive: The prostate is unremarkable. Other: There is no ascites or focal fluid collections within the abdomen or pelvis. Musculoskeletal: No aggressive lytic or sclerotic bone lesions. IMPRESSION: 1. Bilateral pulmonary nodules are stable to decreased in size from previous exam compatible with response to therapy. No new or progressive disease identified. 2. Aortic Atherosclerosis (ICD10-I70.0) and Emphysema (ICD10-J43.9). 3. Aortic aneurysm NOS (ICD10-I71.9). Recommend followup by  ultrasound in 3 years. This recommendation  follows ACR consensus guidelines: White Paper of the ACR Incidental Findings Committee II on Vascular Findings. [facility] Radiol 2013; 10:789-794 Electronically Signed   By: Kerby Moors M.D.   On: 05/20/2017 14:57    ASSESSMENT AND PLAN:  This is a very pleasant 66 years old white male with stage IV non-small cell lung cancer, squamous cell carcinoma presented with multiple bilateral pulmonary nodules. The patient is currently on systemic chemotherapy with carbo platinum and paclitaxel status post 3 cycles. The patient continues to tolerate his treatment well with no significant adverse effects. He had repeat CT scan of the chest, abdomen and pelvis performed recently. I personally and independently reviewed the scans and discussed the results with the patient and his girlfriend. His scan showed no evidence for disease progression. I recommended for him to continue his current treatment with systemic chemotherapy was carboplatin and paclitaxel and he will proceed with cycle #4 today. For the peripheral neuropathy, he will continue his current treatment with gabapentin. For hypertension, the patient was advised to continue his current blood pressure medication with lisinopril and to start hydrochlorothiazide as prescribed by his primary care physician. For insomnia, he will continue his current treatment with Restoril. I will see the patient back for follow-up visit in 3 weeks for evaluation before starting cycle #5. The patient was advised to call immediately if he has any concerning symptoms in the interval. The patient voices understanding of current disease status and treatment options and is in agreement with the current care plan. All questions were answered. The patient knows to call the clinic with any problems, questions or concerns. We can certainly see the patient much sooner if necessary.  Disclaimer: This note was dictated with voice recognition software. Similar sounding words can  inadvertently be transcribed and may not be corrected upon review.

## 2017-05-30 ENCOUNTER — Telehealth: Payer: Self-pay

## 2017-05-30 NOTE — Telephone Encounter (Signed)
Pt knows a person on chemo and claritin helps that person's pain. He is asking if claritin would help him. Instructed him it is possible the claritin might help him and it is OK to try per Dr Julien Nordmann

## 2017-05-31 ENCOUNTER — Telehealth: Payer: Self-pay

## 2017-05-31 ENCOUNTER — Other Ambulatory Visit: Payer: Self-pay

## 2017-05-31 DIAGNOSIS — I6521 Occlusion and stenosis of right carotid artery: Secondary | ICD-10-CM

## 2017-05-31 NOTE — Telephone Encounter (Signed)
Pt sent a fax yesterday and is making sure we received it

## 2017-06-01 ENCOUNTER — Telehealth: Payer: Self-pay | Admitting: Medical Oncology

## 2017-06-01 NOTE — Telephone Encounter (Signed)
This nurse returned patient call for clarification.  "I do not know what fax number I used. The information is for Dr. Julien Nordmann.  [employer] form for my disability."   [employer] Forms staff has not received.   [facility] Records staff describes all insurance requests aroes not have fax.  Insurance request, 'ION' retrieves to be mailed upon their completion are not tracked by [facility].   Notified patient of [facility].I[facility] Information.  "I faxed form to [phone], how long do I have to wait?"  This nurse unable to confirm receipt or completion turnover.  Offered Triage fax number to resend.  This nurse will document receipt.  "There's a number on the form to fax back to them [phone].  I will try to go out to refax if you all did not receive."

## 2017-06-01 NOTE — Telephone Encounter (Signed)
Voicemail "Requesting if fax sent a few days ago was received.  Please call to let me know if it was received [phone]."

## 2017-06-01 NOTE — Telephone Encounter (Signed)
I called pt and told him I received form from [employer] and placed in in disability box.

## 2017-06-02 ENCOUNTER — Telehealth: Payer: Self-pay

## 2017-06-02 ENCOUNTER — Other Ambulatory Visit: Payer: Self-pay | Admitting: *Deleted

## 2017-06-02 MED ORDER — OXYCODONE HCL 5 MG PO TABS: 5.0000 mg | ORAL_TABLET | Freq: Four times a day (QID) | ORAL | 0 refills | Status: DC | PRN

## 2017-06-02 NOTE — Telephone Encounter (Signed)
Pt is asking that oxycodone refill prescription be mailed to him.

## 2017-06-02 NOTE — Telephone Encounter (Signed)
Notified pt rx is ready for pick up,. Pt advised he "wants it mailed to his house as he lives [place]hr 17mins away and cannot drive here for a piece of paper." Informed pt per our pharmacy we are unable to mail rx, pt verbalized understanding and states " well I guess I'll never get it." informed pt his Rx  Is ready to pick up if he would like to do so. Pt verbalized understanding. No further concerns.

## 2017-06-07 ENCOUNTER — Other Ambulatory Visit: Payer: Self-pay | Admitting: Medical Oncology

## 2017-06-07 ENCOUNTER — Telehealth: Payer: Self-pay

## 2017-06-07 NOTE — Telephone Encounter (Signed)
Pt called that he had a spider bite (dx cellulitis) and wanted to make sure the antibiotic sulfamethoxazole he was prescribed  was compatible with his chemo taxol / carbo. Instructed him this is OK.  Also he has an appt in [place] on 8/22 and was requesting an rx oxycodone refill with enough to get him to his next visit 9/18. He is using at least TID. Please call pt when ready for pickup.

## 2017-06-08 ENCOUNTER — Other Ambulatory Visit: Payer: Self-pay | Admitting: Medical Oncology

## 2017-06-08 ENCOUNTER — Encounter: Payer: Self-pay | Admitting: Diagnostic Neuroimaging

## 2017-06-08 ENCOUNTER — Ambulatory Visit (INDEPENDENT_AMBULATORY_CARE_PROVIDER_SITE_OTHER): Payer: BLUE CROSS/BLUE SHIELD | Admitting: Diagnostic Neuroimaging

## 2017-06-08 ENCOUNTER — Telehealth: Payer: Self-pay | Admitting: Medical Oncology

## 2017-06-08 VITALS — BP 132/85 | HR 64 | Wt 214.4 lb

## 2017-06-08 DIAGNOSIS — C7800 Secondary malignant neoplasm of unspecified lung: Secondary | ICD-10-CM

## 2017-06-08 DIAGNOSIS — R9089 Other abnormal findings on diagnostic imaging of central nervous system: Secondary | ICD-10-CM | POA: Diagnosis not present

## 2017-06-08 DIAGNOSIS — I63131 Cerebral infarction due to embolism of right carotid artery: Secondary | ICD-10-CM

## 2017-06-08 DIAGNOSIS — G4700 Insomnia, unspecified: Secondary | ICD-10-CM

## 2017-06-08 MED ORDER — OXYCODONE HCL 5 MG PO TABS: 5.0000 mg | ORAL_TABLET | Freq: Four times a day (QID) | ORAL | 0 refills | Status: DC | PRN

## 2017-06-08 MED ORDER — TEMAZEPAM 30 MG PO CAPS: 30.0000 mg | ORAL_CAPSULE | Freq: Every evening | ORAL | 0 refills | Status: DC | PRN

## 2017-06-08 NOTE — Progress Notes (Signed)
[facility]  PATIENT: MARKICE TORBERT DOB: [birth year]  REFERRING CLINICIAN: Mayme Genta, MD HISTORY FROM: patient REASON FOR VISIT: follow up    HISTORICAL  CHIEF COMPLAINT:  Chief Complaint  Patient presents with  . Cerebrovascular Accident    rm 6  . Follow-up    3 month    HISTORY OF PRESENT ILLNESS:   UPDATE 06/08/17: Since last visit, had transient right eye blindness (2 hours), about 3 weeks ago. Dr. Woody Seller ordered carotid u/s (showed right ICA occlusion, and left ICA stenosis). Unfortunately, patient never had CTA head / neck that I had ordered at last visit for some reason. It is possible that he missed that scan because he was in the hospital in May 11-17, 2018 (just after I saw him) for GI perforation. Patient not sure what happened to CTA imaging order or appt. In any case, patient now has vascular surgery appt on 07/18/17 with Dr. Arita Miss.  PRIOR HPI (02/23/17): 66 year old male here for evaluation of abnormal MRI brain and possible embolic strokes. December 2017 patient was having intermittent left hand numbness. He noticed some mild heaviness with left hand. This has been intermittent but improved since that time. March 2018 patient was having chest pain, cough, had abnormal chest x-ray, and ultimately was found to have stage IV (T3 N0 M1 a) non-small cell lung cancer, squamous cell carcinoma with bilateral pulmonary nodules. Patient had MRI of the brain for lungs cancer staging was found to have abnormal lesions in the right frontal lobe, possibly brain metastases. These were reviewed in tumor board and possibility of embolic strokes was raised. Therefore patient referred to me for further evaluation. Patient denies any left face or left leg problems. He does report some mild left hand weakness which date back to December 2017. He has had some intermittent confusion, getting lost while driving, short temper, increased anxiety, poor sleep patterns. Some of these  precede cancer diagnosis and some of them have worsened since cancer diagnosis. Apparently patient had carotid ultrasound with PCP a few months ago which showed "vein blockage" in some location is neck.    REVIEW OF SYSTEMS: Full 14 system review of systems performed and negative with exception of: Fatigue.  ALLERGIES: Allergies  Allergen Reactions  . No Known Allergies     HOME MEDICATIONS: Outpatient Medications Prior to Visit  Medication Sig Dispense Refill  . acetaminophen (TYLENOL) 325 MG tablet You can take 2 every 4 hours as needed.  You can buy it over the counter DO NOT TAKE MORE THAN 4000 MG OF TYLENOL PER DAY.  IT CAN HARM YOUR LIVER.    Marland Kitchen gabapentin (NEURONTIN) 100 MG capsule Take 100 mg by mouth 2 (two) times daily.  0  . hydrochlorothiazide (HYDRODIURIL) 25 MG tablet Restart this after you see your Primary care doctor.  Check your blood pressure at home and record.    Marland Kitchen lisinopril (PRINIVIL,ZESTRIL) 40 MG tablet Take 40 mg by mouth daily.    . mirtazapine (REMERON) 30 MG tablet Take 1 tablet (30 mg total) by mouth at bedtime. 30 tablet 1  . mupirocin ointment (BACTROBAN) 2 % Finish up what you have from the hospital 22 g 0  . oxyCODONE (OXY IR/ROXICODONE) 5 MG immediate release tablet Take 1-2 tablets (5-10 mg total) by mouth every 6 (six) hours as needed for moderate pain. 40 tablet 0  . pantoprazole (PROTONIX) 40 MG tablet Take 1 tablet (40 mg total) by mouth 2 (two) times daily. 60 tablet  1  . temazepam (RESTORIL) 30 MG capsule Take 1 capsule (30 mg total) by mouth at bedtime as needed for sleep. 30 capsule 0   No facility-administered medications prior to visit.     PAST MEDICAL HISTORY: Past Medical History:  Diagnosis Date  . Brain metastases ([place]) 02/15/2017  . Depression 01/29/2017  . Dyspnea   . Encounter for antineoplastic chemotherapy 01/29/2017  . Goals of care, counseling/discussion 01/29/2017  . Hypertension   . Metastatic squamous cell carcinoma to lung,  unspecified laterality ([place]) 01/28/2017  . Stroke [facility])     PAST SURGICAL HISTORY: Past Surgical History:  Procedure Laterality Date  . BACK SURGERY     cyst removal  . COLONOSCOPY    . LAPAROTOMY N/A 02/25/2017   Procedure: EXPLORATORY LAPAROTOMY MODIFIED GRAHAM'S PATCH;  Surgeon: Kinsinger, Arta Bruce, MD;  Location: [facility];  Service: General;  Laterality: N/A;  . MINOR PLACEMENT OF FIDUCIAL N/A 01/19/2017   Procedure: MINOR PLACEMENT OF FIDUCIAL x 6;  Surgeon: Collene Gobble, MD;  Location: [place];  Service: Thoracic;  Laterality: N/A;  . VIDEO BRONCHOSCOPY WITH ENDOBRONCHIAL NAVIGATION N/A 01/19/2017   Procedure: VIDEO BRONCHOSCOPY WITH ENDOBRONCHIAL NAVIGATION;  Surgeon: Collene Gobble, MD;  Location: [place];  Service: Thoracic;  Laterality: N/A;    FAMILY HISTORY: Family History  Problem Relation Age of Onset  . Cancer Father        unsure of what type    SOCIAL HISTORY:  Social History   Social History  . Marital status: Single    Spouse name: N/A  . Number of children: 0  . Years of education: 10   Occupational History  .      NA   Social History Main Topics  . Smoking status: Current Every Day Smoker    Packs/day: 0.50    Years: 45.00    Types: Cigarettes  . Smokeless tobacco: Never Used  . Alcohol use No  . Drug use: Yes    Types: Marijuana     Comment: daily  . Sexual activity: Not on file   Other Topics Concern  . Not on file   Social History Narrative   Lives alone   Caffeine- coffee, 5 cups daily     PHYSICAL EXAM  GENERAL EXAM/CONSTITUTIONAL: Vitals:  Vitals:   06/08/17 1428  BP: 132/85  Pulse: 64  Weight: 214 lb 6.4 oz (97.3 kg)   Body mass index is 32.6 kg/m. No exam data present  Patient is in no distress; well developed, nourished and groomed; neck is supple  CARDIOVASCULAR:  Examination of carotid arteries is normal; no carotid bruits  Regular rate and rhythm, no murmurs  Examination of peripheral vascular system by  observation and palpation is normal  EYES:  Ophthalmoscopic exam of optic discs and posterior segments is normal; no papilledema or hemorrhages  MUSCULOSKELETAL:  Gait, strength, tone, movements noted in Neurologic exam below  NEUROLOGIC: MENTAL STATUS:  No flowsheet data found.  awake, alert, oriented to person, place and time  recent and remote memory intact  normal attention and concentration  language fluent, comprehension intact, naming intact,   fund of knowledge appropriate  CRANIAL NERVE:   2nd - no papilledema on fundoscopic exam  2nd, 3rd, 4th, 6th - pupils equal and reactive to light, visual fields full to confrontation, extraocular muscles intact, no nystagmus  5th - facial sensation symmetric  7th - facial strength symmetric  8th - hearing intact  9th - palate elevates symmetrically, uvula  midline  11th - shoulder shrug symmetric  12th - tongue protrusion midline  MOTOR:   normal bulk and tone, full strength in the BUE, BLE  RIGHT ARM SLIGHTLY ORBITS AROUND LEFT ARM  SENSORY:   normal and symmetric to light touch, pinprick, temperature, vibration  EXCEPT SLIGHTLY DECR TEMP IN LEFT HAND  COORDINATION:   finger-nose-finger, fine finger movements normal  REFLEXES:   deep tendon reflexes present and symmetric  GAIT/STATION:   narrow based gait; able to walk tandem; romberg is negative    DIAGNOSTIC DATA (LABS, IMAGING, TESTING) - I reviewed patient records, labs, notes, testing and imaging myself where available.  Lab Results  Component Value Date   WBC 9.5 05/24/2017   HGB 13.2 05/24/2017   HCT 41.0 05/24/2017   MCV 91.7 05/24/2017   PLT 209 05/24/2017      Component Value Date/Time   NA 136 05/24/2017 1136   K 4.2 05/24/2017 1136   CL 99 (L) 03/03/2017 0408   CO2 25 05/24/2017 1136   GLUCOSE 156 (H) 05/24/2017 1136   BUN 14.0 05/24/2017 1136   CREATININE 0.9 05/24/2017 1136   CALCIUM 9.3 05/24/2017 1136   PROT 7.1  05/24/2017 1136   ALBUMIN 3.8 05/24/2017 1136   AST 15 05/24/2017 1136   ALT 13 05/24/2017 1136   ALKPHOS 63 05/24/2017 1136   BILITOT 0.43 05/24/2017 1136   GFRNONAA >60 03/03/2017 0408   GFRAA >60 03/03/2017 0408   Lab Results  Component Value Date   CHOL 155 02/23/2017   HDL 55 02/23/2017   LDLCALC 82 02/23/2017   TRIG 90 02/23/2017   CHOLHDL 2.8 02/23/2017   Lab Results  Component Value Date   HGBA1C 6.4 (H) 03/01/2017   No results found for: VITAMINB12 No results found for: TSH   02/08/17 MRI brain [I reviewed images myself and agree with interpretation. In addition the right ICA flow void appears irregular and suggests proximal stenosis or occlusion. -VRP]  1. Multiple contrast-enhancing lesions clustered within the right frontal lobe. In the context of metastatic lung cancer, metastases are a primary concern. However, this is not the typical appearance of brain parenchymal metastases, which usually have a more rounded morphology and greater associated vasogenic edema. Additionally, there are prominent cortical vessels associated with these lesions and the lesion margins are somewhat indistinct. Vasculitis can have this appearance, as can lymphoma, though the latter is less likely. CTA of the head might be helpful to further assess the vessels closely associated with these lesions and to provide more global assessment of the cerebral arteries. 2. No acute ischemia or intracranial hemorrhage. 3. Chronic microvascular ischemia.  04/11/17 MRI brain [I reviewed images myself and agree with interpretation. -VRP]  1. Evolution of signal abnormality and enhancement in the anterior right frontal lobe since April is most compatible with subacute and chronic ischemia. And there is evidence of poor flow versus occlusion of the cervical right ICA. 2.  No metastatic disease identified. 3. Underlying advanced but nonspecific cerebral white matter signal changes. Accelerated  chronic small vessel disease and/or vasculitis are top differential considerations.    ASSESSMENT AND PLAN  66 y.o. year old male here with stage IV (T3 N0 M1 a) non-small cell lung cancer, squamous cell carcinoma with bilateral pulmonary nodules.   Now found to have abnormal right frontal lobe lesions, enhancing, raising possibility of cerebral metastases versus cerebral infarctions. I reviewed imaging myself and there are features for both possibilities. If these are ischemic infarctions, they tend  to fall in the border zone watershed region between the right anterior cerebral artery and right middle cerebral artery. In combination with the right ICA flow void abnormality, this would fit with a watershed ischemic infarction pattern. Will proceed with further workup.   Ddx: right frontal ischemic infarctions (watershed vs embolic infarcts due to right ICA stenosis or occlusion) vs cerebral metastases (less likely)  1. Cerebrovascular accident (CVA) due to embolism of right carotid artery ([place])   2. Abnormal finding on MRI of brain   3. Metastatic squamous cell carcinoma to lung, unspecified laterality ([place])      PLAN:  EMBOLIC STROKE (May 7425) + RIGHT EYE AMAUROSIS FUGAX (Aug 2018) - check CTA head / neck (ordered last visit, but not done; patient was in hospital for GI perforation around that time) --> to follow up abnormal right ICA flow void on MRI brain; then follow up with Dr. Arita Miss (vascular surgery)  STROKE PREVENTION - continue lisinopril, HCTZ - patient has stopped aspirin since his GI perforation (duodenum; pyloric channel ulceration), although he was discharged on aspirin '81mg'$  daily; will ask PCP and GI surgeon if patient is cleared to take aspirin now  LUNG CANCER  - per Dr. Julien Nordmann  Return in about 6 months (around 12/09/2017).  I reviewed images, labs, notes, records myself. I summarized findings and reviewed with patient, for this high risk condition (stroke; met  lung CA; carotid occlusion / stenosis) requiring high complexity decision making.    Penni Bombard, MD 9/56/3875, 6:43 PM Certified in Neurology, Neurophysiology and Neuroimaging  [facility] [address], [place], [place] [address] [phone]

## 2017-06-08 NOTE — Telephone Encounter (Signed)
I called to get information about pain med refill request. Friend reports pt at dentist . She stated he takes 1-2 pain pills 3-4 times a day. rx ready for pick up

## 2017-06-09 ENCOUNTER — Telehealth: Payer: Self-pay | Admitting: *Deleted

## 2017-06-09 NOTE — Telephone Encounter (Signed)
Voicemail:   "Spoke with two of Dr. Worthy Flank nurses last week about form sent in last Wednesday.  Not yet received a call back.  I need monthly information for my disability.  Call me [phone]."  Per H.I.M., forms specialist this form currently in work.  Specialist out office on 06-10-2017.  Average forms turn over for completion is 7 to 10 business days.  Form to be faxed upon completion.

## 2017-06-14 ENCOUNTER — Encounter: Payer: Self-pay | Admitting: *Deleted

## 2017-06-14 ENCOUNTER — Ambulatory Visit (HOSPITAL_BASED_OUTPATIENT_CLINIC_OR_DEPARTMENT_OTHER): Payer: BLUE CROSS/BLUE SHIELD

## 2017-06-14 ENCOUNTER — Telehealth: Payer: Self-pay | Admitting: Internal Medicine

## 2017-06-14 ENCOUNTER — Encounter: Payer: Self-pay | Admitting: Internal Medicine

## 2017-06-14 ENCOUNTER — Ambulatory Visit (HOSPITAL_BASED_OUTPATIENT_CLINIC_OR_DEPARTMENT_OTHER): Payer: BLUE CROSS/BLUE SHIELD | Admitting: Internal Medicine

## 2017-06-14 ENCOUNTER — Other Ambulatory Visit (HOSPITAL_BASED_OUTPATIENT_CLINIC_OR_DEPARTMENT_OTHER): Payer: BLUE CROSS/BLUE SHIELD

## 2017-06-14 DIAGNOSIS — C7800 Secondary malignant neoplasm of unspecified lung: Secondary | ICD-10-CM

## 2017-06-14 DIAGNOSIS — Z5111 Encounter for antineoplastic chemotherapy: Secondary | ICD-10-CM | POA: Diagnosis not present

## 2017-06-14 DIAGNOSIS — C3490 Malignant neoplasm of unspecified part of unspecified bronchus or lung: Secondary | ICD-10-CM | POA: Diagnosis not present

## 2017-06-14 DIAGNOSIS — Z5189 Encounter for other specified aftercare: Secondary | ICD-10-CM | POA: Diagnosis not present

## 2017-06-14 DIAGNOSIS — C7931 Secondary malignant neoplasm of brain: Secondary | ICD-10-CM

## 2017-06-14 DIAGNOSIS — G629 Polyneuropathy, unspecified: Secondary | ICD-10-CM

## 2017-06-14 LAB — CBC WITH DIFFERENTIAL/PLATELET
BASO%: 0.4 % (ref 0.0–2.0)
Basophils Absolute: 0 10*3/uL (ref 0.0–0.1)
EOS ABS: 0.1 10*3/uL (ref 0.0–0.5)
EOS%: 0.8 % (ref 0.0–7.0)
HCT: 40.9 % (ref 38.4–49.9)
HGB: 13.6 g/dL (ref 13.0–17.1)
LYMPH%: 46 % (ref 14.0–49.0)
MCH: 30.6 pg (ref 27.2–33.4)
MCHC: 33.3 g/dL (ref 32.0–36.0)
MCV: 91.9 fL (ref 79.3–98.0)
MONO#: 0.9 10*3/uL (ref 0.1–0.9)
MONO%: 11.2 % (ref 0.0–14.0)
NEUT%: 41.6 % (ref 39.0–75.0)
NEUTROS ABS: 3.5 10*3/uL (ref 1.5–6.5)
NRBC: 0 % (ref 0–0)
PLATELETS: 289 10*3/uL (ref 140–400)
RBC: 4.45 10*6/uL (ref 4.20–5.82)
RDW: 17.3 % — AB (ref 11.0–14.6)
WBC: 8.4 10*3/uL (ref 4.0–10.3)
lymph#: 3.9 10*3/uL — ABNORMAL HIGH (ref 0.9–3.3)

## 2017-06-14 LAB — COMPREHENSIVE METABOLIC PANEL
ALT: 11 U/L (ref 0–55)
AST: 13 U/L (ref 5–34)
Albumin: 3.9 g/dL (ref 3.5–5.0)
Alkaline Phosphatase: 67 U/L (ref 40–150)
Anion Gap: 7 mEq/L (ref 3–11)
BILIRUBIN TOTAL: 0.39 mg/dL (ref 0.20–1.20)
BUN: 14.7 mg/dL (ref 7.0–26.0)
CHLORIDE: 104 meq/L (ref 98–109)
CO2: 24 meq/L (ref 22–29)
CREATININE: 1 mg/dL (ref 0.7–1.3)
Calcium: 9.7 mg/dL (ref 8.4–10.4)
EGFR: 78 mL/min/{1.73_m2} — ABNORMAL LOW (ref >90)
GLUCOSE: 119 mg/dL (ref 70–140)
Potassium: 4.3 mEq/L (ref 3.5–5.1)
SODIUM: 135 meq/L — AB (ref 136–145)
TOTAL PROTEIN: 7.2 g/dL (ref 6.4–8.3)

## 2017-06-14 MED ORDER — PACLITAXEL CHEMO INJECTION 300 MG/50ML
175.0000 mg/m2 | Freq: Once | INTRAVENOUS | Status: AC
  Administered 2017-06-14: 378 mg via INTRAVENOUS
  Filled 2017-06-14: qty 63

## 2017-06-14 MED ORDER — DEXAMETHASONE SODIUM PHOSPHATE 100 MG/10ML IJ SOLN
20.0000 mg | Freq: Once | INTRAMUSCULAR | Status: AC
  Administered 2017-06-14: 20 mg via INTRAVENOUS
  Filled 2017-06-14: qty 2

## 2017-06-14 MED ORDER — DIPHENHYDRAMINE HCL 50 MG/ML IJ SOLN
50.0000 mg | Freq: Once | INTRAMUSCULAR | Status: AC
  Administered 2017-06-14: 50 mg via INTRAVENOUS

## 2017-06-14 MED ORDER — GABAPENTIN 300 MG PO CAPS: 300.0000 mg | ORAL_CAPSULE | Freq: Three times a day (TID) | ORAL | 1 refills | Status: DC

## 2017-06-14 MED ORDER — PEGFILGRASTIM 6 MG/0.6ML ~~LOC~~ PSKT
6.0000 mg | PREFILLED_SYRINGE | Freq: Once | SUBCUTANEOUS | Status: AC
  Administered 2017-06-14: 6 mg via SUBCUTANEOUS
  Filled 2017-06-14: qty 0.6

## 2017-06-14 MED ORDER — FAMOTIDINE IN NACL 20-0.9 MG/50ML-% IV SOLN
20.0000 mg | Freq: Once | INTRAVENOUS | Status: AC
  Administered 2017-06-14: 20 mg via INTRAVENOUS

## 2017-06-14 MED ORDER — FAMOTIDINE IN NACL 20-0.9 MG/50ML-% IV SOLN
INTRAVENOUS | Status: AC
  Filled 2017-06-14: qty 50

## 2017-06-14 MED ORDER — SODIUM CHLORIDE 0.9 % IV SOLN
Freq: Once | INTRAVENOUS | Status: AC
  Administered 2017-06-14: 13:00:00 via INTRAVENOUS

## 2017-06-14 MED ORDER — DIPHENHYDRAMINE HCL 50 MG/ML IJ SOLN
INTRAMUSCULAR | Status: AC
  Filled 2017-06-14: qty 1

## 2017-06-14 MED ORDER — SODIUM CHLORIDE 0.9 % IV SOLN
637.5000 mg | Freq: Once | INTRAVENOUS | Status: AC
  Administered 2017-06-14: 640 mg via INTRAVENOUS
  Filled 2017-06-14: qty 64

## 2017-06-14 MED ORDER — PALONOSETRON HCL INJECTION 0.25 MG/5ML
0.2500 mg | Freq: Once | INTRAVENOUS | Status: AC
  Administered 2017-06-14: 0.25 mg via INTRAVENOUS

## 2017-06-14 MED ORDER — PALONOSETRON HCL INJECTION 0.25 MG/5ML
INTRAVENOUS | Status: AC
  Filled 2017-06-14: qty 5

## 2017-06-14 NOTE — Patient Instructions (Signed)
[facility] Discharge Instructions for Patients Receiving Chemotherapy  Today you received the following chemotherapy agents Taxol and Carboplatin. To help prevent nausea and vomiting after your treatment, we encourage you to take your nausea medication as directed.  If you develop nausea and vomiting that is not controlled by your nausea medication, call the clinic.   BELOW ARE SYMPTOMS THAT SHOULD BE REPORTED IMMEDIATELY:  *FEVER GREATER THAN 100.5 F  *CHILLS WITH OR WITHOUT FEVER  NAUSEA AND VOMITING THAT IS NOT CONTROLLED WITH YOUR NAUSEA MEDICATION  *UNUSUAL SHORTNESS OF BREATH  *UNUSUAL BRUISING OR BLEEDING  TENDERNESS IN MOUTH AND THROAT WITH OR WITHOUT PRESENCE OF ULCERS  *URINARY PROBLEMS  *BOWEL PROBLEMS  UNUSUAL RASH Items with * indicate a potential emergency and should be followed up as soon as possible.  Feel free to call the clinic you have any questions or concerns. The clinic phone number is [phone].  Please show the CHEMO ALERT CARD at check-in to the Emergency Department and triage nurse.    

## 2017-06-14 NOTE — Patient Instructions (Signed)
Steps to Quit Smoking Smoking tobacco can be bad for your health. It can also affect almost every organ in your body. Smoking puts you and people around you at risk for many serious long-lasting (chronic) diseases. Quitting smoking is hard, but it is one of the best things that you can do for your health. It is never too late to quit. What are the benefits of quitting smoking? When you quit smoking, you lower your risk for getting serious diseases and conditions. They can include:  Lung cancer or lung disease.  Heart disease.  Stroke.  Heart attack.  Not being able to have children (infertility).  Weak bones (osteoporosis) and broken bones (fractures).  If you have coughing, wheezing, and shortness of breath, those symptoms may get better when you quit. You may also get sick less often. If you are pregnant, quitting smoking can help to lower your chances of having a baby of low birth weight. What can I do to help me quit smoking? Talk with your doctor about what can help you quit smoking. Some things you can do (strategies) include:  Quitting smoking totally, instead of slowly cutting back how much you smoke over a period of time.  Going to in-person counseling. You are more likely to quit if you go to many counseling sessions.  Using resources and support systems, such as: ? Online chats with a counselor. ? Phone quitlines. ? Printed self-help materials. ? Support groups or group counseling. ? Text messaging programs. ? Mobile phone apps or applications.  Taking medicines. Some of these medicines may have nicotine in them. If you are pregnant or breastfeeding, do not take any medicines to quit smoking unless your doctor says it is okay. Talk with your doctor about counseling or other things that can help you.  Talk with your doctor about using more than one strategy at the same time, such as taking medicines while you are also going to in-person counseling. This can help make  quitting easier. What things can I do to make it easier to quit? Quitting smoking might feel very hard at first, but there is a lot that you can do to make it easier. Take these steps:  Talk to your family and friends. Ask them to support and encourage you.  Call phone quitlines, reach out to support groups, or work with a counselor.  Ask people who smoke to not smoke around you.  Avoid places that make you want (trigger) to smoke, such as: ? Bars. ? Parties. ? Smoke-break areas at work.  Spend time with people who do not smoke.  Lower the stress in your life. Stress can make you want to smoke. Try these things to help your stress: ? Getting regular exercise. ? Deep-breathing exercises. ? Yoga. ? Meditating. ? Doing a body scan. To do this, close your eyes, focus on one area of your body at a time from head to toe, and notice which parts of your body are tense. Try to relax the muscles in those areas.  Download or buy apps on your mobile phone or tablet that can help you stick to your quit plan. There are many free apps, such as QuitGuide from the CDC (Centers for Disease Control and Prevention). You can find more support from smokefree.gov and other websites.  This information is not intended to replace advice given to you by your health care provider. Make sure you discuss any questions you have with your health care provider. Document Released: 07/31/2009 Document   Revised: 06/01/2016 Document Reviewed: 02/18/2015 Elsevier Interactive Patient Education  2018 [employer]  

## 2017-06-14 NOTE — Progress Notes (Signed)
[place] Telephone:([phone]) [phone]   [phone]  OFFICE PROGRESS NOTE  [name], MD [place] [address]  DIAGNOSIS: Stage IV (T3, N0, M1 a) non-small cell lung cancer, squamous cell carcinoma presented with multiple bilateral pulmonary nodules diagnosed in April 2018.  PRIOR THERAPY: None.  CURRENT THERAPY: Systemic chemotherapy with carboplatin for AUC of 5 and paclitaxel 175 MG/M2 every 3 weeks with Neulasta support. Status post 4 cycles.  INTERVAL HISTORY: GWYN MEHRING 66 y.o. male returns to the clinic today for follow-up visit accompanied by his girlfriend. The patient is feeling fine today with no specific complaints except for peripheral neuropathy that has been going on even before starting treatment. He is currently on gabapentin 100 mg by mouth twice a day with mild improvement. He also use Percocet which is not effective. He denied having any chest pain, shortness of breath, cough or hemoptysis. He denied having any fever or chills. He has no nausea, vomiting, diarrhea or constipation. He denied having any significant weight loss or night sweats. He is in today for evaluation before starting cycle #5.   MEDICAL HISTORY: Past Medical History:  Diagnosis Date  . Brain metastases ([place]) 02/15/2017  . Depression 01/29/2017  . Dyspnea   . Encounter for antineoplastic chemotherapy 01/29/2017  . Goals of care, counseling/discussion 01/29/2017  . Hypertension   . Metastatic squamous cell carcinoma to lung, unspecified laterality ([place]) 01/28/2017  . Stroke [facility])     ALLERGIES:  is allergic to no known allergies.  MEDICATIONS:  Current Outpatient Prescriptions  Medication Sig Dispense Refill  . acetaminophen (TYLENOL) 325 MG tablet You can take 2 every 4 hours as needed.  You can buy it over the counter DO NOT TAKE MORE THAN 4000 MG OF TYLENOL PER DAY.  IT CAN HARM YOUR LIVER.    Marland Kitchen gabapentin (NEURONTIN) 100 MG capsule Take 100 mg by mouth  2 (two) times daily.  0  . hydrochlorothiazide (HYDRODIURIL) 25 MG tablet Restart this after you see your Primary care doctor.  Check your blood pressure at home and record.    Marland Kitchen lisinopril (PRINIVIL,ZESTRIL) 40 MG tablet Take 40 mg by mouth daily.    . metoprolol succinate (TOPROL-XL) 50 MG 24 hr tablet 50 mg.  0  . mirtazapine (REMERON) 30 MG tablet Take 1 tablet (30 mg total) by mouth at bedtime. 30 tablet 1  . mupirocin ointment (BACTROBAN) 2 % Finish up what you have from the hospital 22 g 0  . oxyCODONE (OXY IR/ROXICODONE) 5 MG immediate release tablet Take 1-2 tablets (5-10 mg total) by mouth every 6 (six) hours as needed for moderate pain. 40 tablet 0  . pantoprazole (PROTONIX) 40 MG tablet Take 1 tablet (40 mg total) by mouth 2 (two) times daily. 60 tablet 1  . temazepam (RESTORIL) 30 MG capsule Take 1 capsule (30 mg total) by mouth at bedtime as needed for sleep. 30 capsule 0  . sulfamethoxazole-trimethoprim (BACTRIM DS,SEPTRA DS) 800-160 MG tablet   0   No current facility-administered medications for this visit.     SURGICAL HISTORY:  Past Surgical History:  Procedure Laterality Date  . BACK SURGERY     cyst removal  . COLONOSCOPY    . LAPAROTOMY N/A 02/25/2017   Procedure: EXPLORATORY LAPAROTOMY MODIFIED GRAHAM'S PATCH;  Surgeon: Kieth Brightly Arta Bruce, MD;  Location: [facility];  Service: General;  Laterality: N/A;  . MINOR PLACEMENT OF FIDUCIAL N/A 01/19/2017   Procedure:  MINOR PLACEMENT OF FIDUCIAL x 6;  Surgeon: Collene Gobble, MD;  Location: [place];  Service: Thoracic;  Laterality: N/A;  . VIDEO BRONCHOSCOPY WITH ENDOBRONCHIAL NAVIGATION N/A 01/19/2017   Procedure: VIDEO BRONCHOSCOPY WITH ENDOBRONCHIAL NAVIGATION;  Surgeon: Collene Gobble, MD;  Location: [facility];  Service: Thoracic;  Laterality: N/A;    REVIEW OF SYSTEMS:  A comprehensive review of systems was negative except for: Neurological: positive for paresthesia   PHYSICAL EXAMINATION: General appearance: alert, cooperative  and no distress Head: Normocephalic, without obvious abnormality, atraumatic Neck: no adenopathy, no JVD, supple, symmetrical, trachea midline and thyroid not enlarged, symmetric, no tenderness/mass/nodules Lymph nodes: Cervical, supraclavicular, and axillary nodes normal. Resp: clear to auscultation bilaterally Back: symmetric, no curvature. ROM normal. No CVA tenderness. Cardio: regular rate and rhythm, S1, S2 normal, no murmur, click, rub or gallop GI: soft, non-tender; bowel sounds normal; no masses,  no organomegaly Extremities: extremities normal, atraumatic, no cyanosis or edema  ECOG PERFORMANCE STATUS: 1 - Symptomatic but completely ambulatory  Blood pressure 122/76, pulse 63, temperature 97.8 F (36.6 C), temperature source Oral, resp. rate 18, height 5\' 8"  (1.727 m), weight 210 lb 12.8 oz (95.6 kg), SpO2 94 %.  LABORATORY DATA: Lab Results  Component Value Date   WBC 8.4 06/14/2017   HGB 13.6 06/14/2017   HCT 40.9 06/14/2017   MCV 91.9 06/14/2017   PLT 289 06/14/2017      Chemistry      Component Value Date/Time   NA 135 (L) 06/14/2017 1101   K 4.3 06/14/2017 1101   CL 99 (L) 03/03/2017 0408   CO2 24 06/14/2017 1101   BUN 14.7 06/14/2017 1101   CREATININE 1.0 06/14/2017 1101      Component Value Date/Time   CALCIUM 9.7 06/14/2017 1101   ALKPHOS 67 06/14/2017 1101   AST 13 06/14/2017 1101   ALT 11 06/14/2017 1101   BILITOT 0.39 06/14/2017 1101       RADIOGRAPHIC STUDIES: Ct Chest W Contrast  Result Date: 05/20/2017 CLINICAL DATA:  Stage IV lung cancer.  Restaging. EXAM: CT CHEST, ABDOMEN, AND PELVIS WITH CONTRAST TECHNIQUE: Multidetector CT imaging of the chest, abdomen and pelvis was performed following the standard protocol during bolus administration of intravenous contrast. CONTRAST:  114mL ISOVUE-300 IOPAMIDOL (ISOVUE-300) INJECTION 61%, 16mL ISOVUE-300 IOPAMIDOL (ISOVUE-300) INJECTION 61% COMPARISON:  None. FINDINGS: CT CHEST FINDINGS Cardiovascular:  There is aortic atherosclerosis noted. Normal heart size. No pericardial effusion. Mediastinum/Nodes: No enlarged mediastinal, hilar, or axillary lymph nodes. Thyroid gland, trachea, and esophagus demonstrate no significant findings. Lungs/Pleura: No pleural effusion. Moderate changes of paraseptal emphysema. The spiculated index nodule located in the posterolateral right upper lobe measures 1.4 cm, image 47 of series 4. Unchanged from previous exam. The central right upper lobe nodule measures 1.5 cm, image 76 of series 4. Previously 2.2 cm. Left upper lobe nodule Measures 2.0 x 1.3 cm, image 76 of series 4. Previous 2.0 x 1.5 cm peer the subpleural nodule within the left lower lobe measures 4 mm, image 108 of series 4. Previously 4 mm. Left upper lobe lung nodule measures 6 mm, image 66 of series 4. Previously this measured the same. Musculoskeletal: There are no aggressive lytic or sclerotic bone lesions identified. CT ABDOMEN PELVIS FINDINGS Hepatobiliary: There is no focal liver abnormality identified. The gallbladder appears within normal limits. No biliary dilatation. Pancreas: Unremarkable. No pancreatic ductal dilatation or surrounding inflammatory changes. Spleen: Normal in size without focal abnormality. Adrenals/Urinary Tract: Low-attenuation enlargement of the adrenal glands  compatible with underlying adenomas. Unchanged from previous exam. The kidneys are unremarkable. No mass or hydronephrosis. Urinary bladder appears normal. Stomach/Bowel: The stomach is normal. The small bowel loops have a normal course and caliber. The appendix is visualized and appears normal. Numerous colonic diverticula identified. No acute inflammation. Vascular/Lymphatic: Aortic atherosclerosis. Infrarenal abdominal aorta measures 3.1 cm, image 80 of series 2. No adenopathy identified within the abdomen or pelvis. Reproductive: The prostate is unremarkable. Other: There is no ascites or focal fluid collections within the  abdomen or pelvis. Musculoskeletal: No aggressive lytic or sclerotic bone lesions. IMPRESSION: 1. Bilateral pulmonary nodules are stable to decreased in size from previous exam compatible with response to therapy. No new or progressive disease identified. 2. Aortic Atherosclerosis (ICD10-I70.0) and Emphysema (ICD10-J43.9). 3. Aortic aneurysm NOS (ICD10-I71.9). Recommend followup by ultrasound in 3 years. This recommendation follows ACR consensus guidelines: White Paper of the ACR Incidental Findings Committee II on Vascular Findings. [employer] Coll Radiol 2013; 10:789-794 Electronically Signed   By: Kerby Moors M.D.   On: 05/20/2017 14:57   Ct Abdomen Pelvis W Contrast  Result Date: 05/20/2017 CLINICAL DATA:  Stage IV lung cancer.  Restaging. EXAM: CT CHEST, ABDOMEN, AND PELVIS WITH CONTRAST TECHNIQUE: Multidetector CT imaging of the chest, abdomen and pelvis was performed following the standard protocol during bolus administration of intravenous contrast. CONTRAST:  160mL ISOVUE-300 IOPAMIDOL (ISOVUE-300) INJECTION 61%, 43mL ISOVUE-300 IOPAMIDOL (ISOVUE-300) INJECTION 61% COMPARISON:  None. FINDINGS: CT CHEST FINDINGS Cardiovascular: There is aortic atherosclerosis noted. Normal heart size. No pericardial effusion. Mediastinum/Nodes: No enlarged mediastinal, hilar, or axillary lymph nodes. Thyroid gland, trachea, and esophagus demonstrate no significant findings. Lungs/Pleura: No pleural effusion. Moderate changes of paraseptal emphysema. The spiculated index nodule located in the posterolateral right upper lobe measures 1.4 cm, image 47 of series 4. Unchanged from previous exam. The central right upper lobe nodule measures 1.5 cm, image 76 of series 4. Previously 2.2 cm. Left upper lobe nodule Measures 2.0 x 1.3 cm, image 76 of series 4. Previous 2.0 x 1.5 cm peer the subpleural nodule within the left lower lobe measures 4 mm, image 108 of series 4. Previously 4 mm. Left upper lobe lung nodule measures 6 mm,  image 66 of series 4. Previously this measured the same. Musculoskeletal: There are no aggressive lytic or sclerotic bone lesions identified. CT ABDOMEN PELVIS FINDINGS Hepatobiliary: There is no focal liver abnormality identified. The gallbladder appears within normal limits. No biliary dilatation. Pancreas: Unremarkable. No pancreatic ductal dilatation or surrounding inflammatory changes. Spleen: Normal in size without focal abnormality. Adrenals/Urinary Tract: Low-attenuation enlargement of the adrenal glands compatible with underlying adenomas. Unchanged from previous exam. The kidneys are unremarkable. No mass or hydronephrosis. Urinary bladder appears normal. Stomach/Bowel: The stomach is normal. The small bowel loops have a normal course and caliber. The appendix is visualized and appears normal. Numerous colonic diverticula identified. No acute inflammation. Vascular/Lymphatic: Aortic atherosclerosis. Infrarenal abdominal aorta measures 3.1 cm, image 80 of series 2. No adenopathy identified within the abdomen or pelvis. Reproductive: The prostate is unremarkable. Other: There is no ascites or focal fluid collections within the abdomen or pelvis. Musculoskeletal: No aggressive lytic or sclerotic bone lesions. IMPRESSION: 1. Bilateral pulmonary nodules are stable to decreased in size from previous exam compatible with response to therapy. No new or progressive disease identified. 2. Aortic Atherosclerosis (ICD10-I70.0) and Emphysema (ICD10-J43.9). 3. Aortic aneurysm NOS (ICD10-I71.9). Recommend followup by ultrasound in 3 years. This recommendation follows ACR consensus guidelines: White Paper of the ACR Incidental  Findings Committee II on Vascular Findings. Natasha [employer] Coll Radiol 2013; 10:789-794 Electronically Signed   By: Kerby Moors M.D.   On: 05/20/2017 14:57    ASSESSMENT AND PLAN:  This is a very pleasant 66 years old white male with stage IV non-small cell lung cancer, squamous cell carcinoma  presented with multiple bilateral pulmonary nodules. The patient is currently on systemic chemotherapy with carbo platinum and paclitaxel status post 4 cycles. He tolerated the last cycle of his treatment fairly well. I recommended for him to proceed with cycle #5 today as scheduled. For the peripheral neuropathy, I will change his dose of gabapentin to 300 mg by mouth 3 times a day. We will discontinue his treatment with Percocet as it was not effective. He would come back for follow-up visit in 3 weeks for evaluation before starting cycle #6. The patient was advised to call immediately if he has any concerning symptoms in the interval. For insomnia, he will continue his current treatment with Restoril. The patient voices understanding of current disease status and treatment options and is in agreement with the current care plan. All questions were answered. The patient knows to call the clinic with any problems, questions or concerns. We can certainly see the patient much sooner if necessary.  Disclaimer: This note was dictated with voice recognition software. Similar sounding words can inadvertently be transcribed and may not be corrected upon review.

## 2017-06-14 NOTE — Telephone Encounter (Signed)
Appts already scheduled per 8/28 los - no additional appts added per los and per treatment plan - ends 9/18

## 2017-06-16 NOTE — Progress Notes (Signed)
[place] Advance Directives Clinical Social Work  Clinical Social Work received request from patient to review existing advance directives and determine if current directives will suffice.  CSW reviewed with patient in chemo room. Patient has living will and HCPOA. Primary HCPOA designated is Joanne Chars May Lester.  Alternate HCPOAs are [employer] and Regino Schultze.   Clinical Social Worker will send documents to medical records to be scanned into patient's chart. Clinical Social Worker encouraged patient/family to contact with any additional questions or concerns.  Maryjean Morn, MSW, LCSW Clinical Social Worker [facility] [phone]

## 2017-07-04 ENCOUNTER — Telehealth: Payer: Self-pay | Admitting: *Deleted

## 2017-07-04 NOTE — Telephone Encounter (Signed)
"  I am scheduled tommow for appointments with Dr. Julien Nordmann.  Checking to see if appointments ar still available due to the weather.  Call me at [phone]."  [facility] [place] is open daily with assessment of his ability to come in offering rescheduling if needed.   "I come in from [address].  I have no way of knowing until I try to come in."  Asked for return call if he needs to reschedule.  No further questions.

## 2017-07-05 ENCOUNTER — Ambulatory Visit (HOSPITAL_BASED_OUTPATIENT_CLINIC_OR_DEPARTMENT_OTHER): Payer: BLUE CROSS/BLUE SHIELD | Admitting: Internal Medicine

## 2017-07-05 ENCOUNTER — Encounter: Payer: Self-pay | Admitting: Internal Medicine

## 2017-07-05 ENCOUNTER — Telehealth: Payer: Self-pay | Admitting: Internal Medicine

## 2017-07-05 ENCOUNTER — Other Ambulatory Visit (HOSPITAL_BASED_OUTPATIENT_CLINIC_OR_DEPARTMENT_OTHER): Payer: BLUE CROSS/BLUE SHIELD

## 2017-07-05 ENCOUNTER — Ambulatory Visit (HOSPITAL_BASED_OUTPATIENT_CLINIC_OR_DEPARTMENT_OTHER): Payer: BLUE CROSS/BLUE SHIELD

## 2017-07-05 VITALS — BP 141/49 | HR 67 | Temp 98.0°F | Resp 18 | Ht 68.0 in | Wt 212.1 lb

## 2017-07-05 DIAGNOSIS — C7931 Secondary malignant neoplasm of brain: Secondary | ICD-10-CM

## 2017-07-05 DIAGNOSIS — G4700 Insomnia, unspecified: Secondary | ICD-10-CM | POA: Diagnosis not present

## 2017-07-05 DIAGNOSIS — Z5111 Encounter for antineoplastic chemotherapy: Secondary | ICD-10-CM

## 2017-07-05 DIAGNOSIS — C3490 Malignant neoplasm of unspecified part of unspecified bronchus or lung: Secondary | ICD-10-CM

## 2017-07-05 DIAGNOSIS — Z5189 Encounter for other specified aftercare: Secondary | ICD-10-CM

## 2017-07-05 DIAGNOSIS — C7800 Secondary malignant neoplasm of unspecified lung: Secondary | ICD-10-CM

## 2017-07-05 DIAGNOSIS — G629 Polyneuropathy, unspecified: Secondary | ICD-10-CM | POA: Insufficient documentation

## 2017-07-05 DIAGNOSIS — G609 Hereditary and idiopathic neuropathy, unspecified: Secondary | ICD-10-CM

## 2017-07-05 LAB — CBC WITH DIFFERENTIAL/PLATELET
BASO%: 0.5 % (ref 0.0–2.0)
Basophils Absolute: 0 10*3/uL (ref 0.0–0.1)
EOS ABS: 0.1 10*3/uL (ref 0.0–0.5)
EOS%: 0.7 % (ref 0.0–7.0)
HCT: 38.1 % — ABNORMAL LOW (ref 38.4–49.9)
HEMOGLOBIN: 12.5 g/dL — AB (ref 13.0–17.1)
LYMPH%: 41.4 % (ref 14.0–49.0)
MCH: 30.5 pg (ref 27.2–33.4)
MCHC: 32.8 g/dL (ref 32.0–36.0)
MCV: 92.9 fL (ref 79.3–98.0)
MONO#: 0.9 10*3/uL (ref 0.1–0.9)
MONO%: 10.7 % (ref 0.0–14.0)
NEUT%: 46.7 % (ref 39.0–75.0)
NEUTROS ABS: 3.9 10*3/uL (ref 1.5–6.5)
Platelets: 259 10*3/uL (ref 140–400)
RBC: 4.1 10*6/uL — AB (ref 4.20–5.82)
RDW: 18 % — AB (ref 11.0–14.6)
WBC: 8.3 10*3/uL (ref 4.0–10.3)
lymph#: 3.4 10*3/uL — ABNORMAL HIGH (ref 0.9–3.3)

## 2017-07-05 LAB — COMPREHENSIVE METABOLIC PANEL
ALBUMIN: 3.7 g/dL (ref 3.5–5.0)
ALK PHOS: 70 U/L (ref 40–150)
ALT: 12 U/L (ref 0–55)
AST: 15 U/L (ref 5–34)
Anion Gap: 10 mEq/L (ref 3–11)
BILIRUBIN TOTAL: 0.36 mg/dL (ref 0.20–1.20)
BUN: 15.3 mg/dL (ref 7.0–26.0)
CO2: 25 meq/L (ref 22–29)
CREATININE: 0.9 mg/dL (ref 0.7–1.3)
Calcium: 9.4 mg/dL (ref 8.4–10.4)
Chloride: 106 mEq/L (ref 98–109)
EGFR: >90 mL/min/{1.73_m2} (ref >90)
GLUCOSE: 121 mg/dL (ref 70–140)
Potassium: 3.9 mEq/L (ref 3.5–5.1)
SODIUM: 140 meq/L (ref 136–145)
TOTAL PROTEIN: 6.9 g/dL (ref 6.4–8.3)

## 2017-07-05 MED ORDER — SODIUM CHLORIDE 0.9 % IV SOLN
Freq: Once | INTRAVENOUS | Status: AC
  Administered 2017-07-05: 13:00:00 via INTRAVENOUS

## 2017-07-05 MED ORDER — SODIUM CHLORIDE 0.9% FLUSH
10.0000 mL | INTRAVENOUS | Status: DC | PRN
  Filled 2017-07-05: qty 10

## 2017-07-05 MED ORDER — HEPARIN SOD (PORK) LOCK FLUSH 100 UNIT/ML IV SOLN
500.0000 [IU] | Freq: Once | INTRAVENOUS | Status: DC | PRN
  Filled 2017-07-05: qty 5

## 2017-07-05 MED ORDER — PACLITAXEL CHEMO INJECTION 300 MG/50ML
175.0000 mg/m2 | Freq: Once | INTRAVENOUS | Status: AC
  Administered 2017-07-05: 378 mg via INTRAVENOUS
  Filled 2017-07-05: qty 63

## 2017-07-05 MED ORDER — FAMOTIDINE IN NACL 20-0.9 MG/50ML-% IV SOLN
INTRAVENOUS | Status: AC
  Filled 2017-07-05: qty 50

## 2017-07-05 MED ORDER — DIPHENHYDRAMINE HCL 50 MG/ML IJ SOLN
50.0000 mg | Freq: Once | INTRAMUSCULAR | Status: AC
  Administered 2017-07-05: 50 mg via INTRAVENOUS

## 2017-07-05 MED ORDER — DIPHENHYDRAMINE HCL 50 MG/ML IJ SOLN
INTRAMUSCULAR | Status: AC
  Filled 2017-07-05: qty 1

## 2017-07-05 MED ORDER — PALONOSETRON HCL INJECTION 0.25 MG/5ML
INTRAVENOUS | Status: AC
  Filled 2017-07-05: qty 5

## 2017-07-05 MED ORDER — SODIUM CHLORIDE 0.9 % IV SOLN
694.5000 mg | Freq: Once | INTRAVENOUS | Status: AC
  Administered 2017-07-05: 690 mg via INTRAVENOUS
  Filled 2017-07-05: qty 69

## 2017-07-05 MED ORDER — PEGFILGRASTIM 6 MG/0.6ML ~~LOC~~ PSKT
6.0000 mg | PREFILLED_SYRINGE | Freq: Once | SUBCUTANEOUS | Status: AC
  Administered 2017-07-05: 6 mg via SUBCUTANEOUS
  Filled 2017-07-05: qty 0.6

## 2017-07-05 MED ORDER — FAMOTIDINE IN NACL 20-0.9 MG/50ML-% IV SOLN
20.0000 mg | Freq: Once | INTRAVENOUS | Status: AC
Start: 2017-07-05 — End: 2017-07-05
  Administered 2017-07-05: 20 mg via INTRAVENOUS

## 2017-07-05 MED ORDER — PALONOSETRON HCL INJECTION 0.25 MG/5ML
0.2500 mg | Freq: Once | INTRAVENOUS | Status: AC
  Administered 2017-07-05: 0.25 mg via INTRAVENOUS

## 2017-07-05 MED ORDER — SODIUM CHLORIDE 0.9 % IV SOLN
20.0000 mg | Freq: Once | INTRAVENOUS | Status: AC
  Administered 2017-07-05: 20 mg via INTRAVENOUS
  Filled 2017-07-05: qty 2

## 2017-07-05 NOTE — Patient Instructions (Signed)
[facility] Discharge Instructions for Patients Receiving Chemotherapy  Today you received the following chemotherapy agents Taxol and Carboplatin. To help prevent nausea and vomiting after your treatment, we encourage you to take your nausea medication as directed.  If you develop nausea and vomiting that is not controlled by your nausea medication, call the clinic.   BELOW ARE SYMPTOMS THAT SHOULD BE REPORTED IMMEDIATELY:  *FEVER GREATER THAN 100.5 F  *CHILLS WITH OR WITHOUT FEVER  NAUSEA AND VOMITING THAT IS NOT CONTROLLED WITH YOUR NAUSEA MEDICATION  *UNUSUAL SHORTNESS OF BREATH  *UNUSUAL BRUISING OR BLEEDING  TENDERNESS IN MOUTH AND THROAT WITH OR WITHOUT PRESENCE OF ULCERS  *URINARY PROBLEMS  *BOWEL PROBLEMS  UNUSUAL RASH Items with * indicate a potential emergency and should be followed up as soon as possible.  Feel free to call the clinic you have any questions or concerns. The clinic phone number is [phone].  Please show the CHEMO ALERT CARD at check-in to the Emergency Department and triage nurse.    

## 2017-07-05 NOTE — Progress Notes (Signed)
[place] Telephone:([phone]   Fax:([phone]  OFFICE PROGRESS NOTE  [name], MD [place] [address]  DIAGNOSIS: Stage IV (T3, N0, M1 a) non-small cell lung cancer, squamous cell carcinoma presented with multiple bilateral pulmonary nodules diagnosed in April 2018.  PRIOR THERAPY: None.  CURRENT THERAPY: Systemic chemotherapy with carboplatin for AUC of 5 and paclitaxel 175 MG/M2 every 3 weeks with Neulasta support. Status post 5 cycles.  INTERVAL HISTORY: [name] [name] 66 y.o. male returns to the clinic today for follow-up visit accompanied by his girlfriend. The patient is doing fine today with no specific complaints except for the persistent peripheral neuropathy. He was switched recently to Neurontin 300 mg by mouth 3 times a day but he continues to have peripheral neuropathy mainly in the toes. He tolerated the last cycle of his treatment fairly well with no significant adverse effects. He has no nausea, vomiting, diarrhea or constipation. He denied having any chest pain, shortness of breath, cough or hemoptysis. He has no fever or chills. He is here today for evaluation before starting cycle #6 of his chemotherapy.   MEDICAL HISTORY: Past Medical History:  Diagnosis Date  . Brain metastases ([place]) 02/15/2017  . Depression 01/29/2017  . Dyspnea   . Encounter for antineoplastic chemotherapy 01/29/2017  . Goals of care, counseling/discussion 01/29/2017  . Hypertension   . Metastatic squamous cell carcinoma to lung, unspecified laterality ([place]) 01/28/2017  . Stroke [facility])     ALLERGIES:  is allergic to no known allergies.  MEDICATIONS:  Current Outpatient Prescriptions  Medication Sig Dispense Refill  . acetaminophen (TYLENOL) 325 MG tablet You can take 2 every 4 hours as needed.  You can buy it over the counter DO NOT TAKE MORE THAN 4000 MG OF TYLENOL PER DAY.  IT CAN HARM YOUR LIVER.    Marland Kitchen gabapentin (NEURONTIN) 300 MG capsule Take 1  capsule (300 mg total) by mouth 3 (three) times daily. 90 capsule 1  . hydrochlorothiazide (HYDRODIURIL) 25 MG tablet Restart this after you see your Primary care doctor.  Check your blood pressure at home and record.    Marland Kitchen lisinopril (PRINIVIL,ZESTRIL) 40 MG tablet Take 40 mg by mouth daily.    . metoprolol succinate (TOPROL-XL) 50 MG 24 hr tablet 50 mg.  0  . mirtazapine (REMERON) 30 MG tablet Take 1 tablet (30 mg total) by mouth at bedtime. 30 tablet 1  . mupirocin ointment (BACTROBAN) 2 % Finish up what you have from the hospital 22 g 0  . oxyCODONE (OXY IR/ROXICODONE) 5 MG immediate release tablet Take 1-2 tablets (5-10 mg total) by mouth every 6 (six) hours as needed for moderate pain. 40 tablet 0  . pantoprazole (PROTONIX) 40 MG tablet Take 1 tablet (40 mg total) by mouth 2 (two) times daily. 60 tablet 1  . sulfamethoxazole-trimethoprim (BACTRIM DS,SEPTRA DS) 800-160 MG tablet   0  . temazepam (RESTORIL) 30 MG capsule Take 1 capsule (30 mg total) by mouth at bedtime as needed for sleep. 30 capsule 0   No current facility-administered medications for this visit.     SURGICAL HISTORY:  Past Surgical History:  Procedure Laterality Date  . BACK SURGERY     cyst removal  . COLONOSCOPY    . LAPAROTOMY N/A 02/25/2017   Procedure: EXPLORATORY LAPAROTOMY MODIFIED GRAHAM'S PATCH;  Surgeon: Kinsinger, Arta Bruce, MD;  Location[facility];  Service: General;  Laterality: N/A;  . MINOR PLACEMENT OF  FIDUCIAL N/A 01/19/2017   Procedure: MINOR PLACEMENT OF FIDUCIAL x 6;  Surgeon: Collene Gobble, MD;  Location: [facility];  Service: Thoracic;  Laterality: N/A;  . VIDEO BRONCHOSCOPY WITH ENDOBRONCHIAL NAVIGATION N/A 01/19/2017   Procedure: VIDEO BRONCHOSCOPY WITH ENDOBRONCHIAL NAVIGATION;  Surgeon: Collene Gobble, MD;  Location: [facility];  Service: Thoracic;  Laterality: N/A;    REVIEW OF SYSTEMS:  Constitutional: positive for fatigue Eyes: negative Ears, nose, mouth, throat, and face: negative Respiratory:  negative Cardiovascular: negative Gastrointestinal: negative Genitourinary:negative Integument/breast: negative Hematologic/lymphatic: negative Musculoskeletal:negative Neurological: positive for paresthesia Behavioral/Psych: negative Endocrine: negative Allergic/Immunologic: negative   PHYSICAL EXAMINATION: General appearance: alert, cooperative and no distress Head: Normocephalic, without obvious abnormality, atraumatic Neck: no adenopathy, no JVD, supple, symmetrical, trachea midline and thyroid not enlarged, symmetric, no tenderness/mass/nodules Lymph nodes: Cervical, supraclavicular, and axillary nodes normal. Resp: clear to auscultation bilaterally Back: symmetric, no curvature. ROM normal. No CVA tenderness. Cardio: regular rate and rhythm, S1, S2 normal, no murmur, click, rub or gallop GI: soft, non-tender; bowel sounds normal; no masses,  no organomegaly Extremities: extremities normal, atraumatic, no cyanosis or edema Neurologic: Alert and oriented X 3, normal strength and tone. Normal symmetric reflexes. Normal coordination and gait  ECOG PERFORMANCE STATUS: 1 - Symptomatic but completely ambulatory  Blood pressure (!) 141/49, pulse 67, temperature 98 F (36.7 C), temperature source Oral, resp. rate 18, height 5\' 8"  (1.727 m), weight 212 lb 1.6 oz (96.2 kg), SpO2 96 %.  LABORATORY DATA: Lab Results  Component Value Date   WBC 8.3 07/05/2017   HGB 12.5 (L) 07/05/2017   HCT 38.1 (L) 07/05/2017   MCV 92.9 07/05/2017   PLT 259 07/05/2017      Chemistry      Component Value Date/Time   NA 135 (L) 06/14/2017 1101   K 4.3 06/14/2017 1101   CL 99 (L) 03/03/2017 0408   CO2 24 06/14/2017 1101   BUN 14.7 06/14/2017 1101   CREATININE 1.0 06/14/2017 1101      Component Value Date/Time   CALCIUM 9.7 06/14/2017 1101   ALKPHOS 67 06/14/2017 1101   AST 13 06/14/2017 1101   ALT 11 06/14/2017 1101   BILITOT 0.39 06/14/2017 1101       RADIOGRAPHIC STUDIES: No results  found.  ASSESSMENT AND PLAN:  This is a very pleasant 66 years old white male with stage IV non-small cell lung cancer, squamous cell carcinoma presented with multiple bilateral pulmonary nodules. The patient is currently on systemic chemotherapy with carboplatin and paclitaxel status post 5 cycles. He continues to tolerate his treatment fairly well with no significant adverse effects except for the peripheral neuropathy that has been going on even before starting the treatment. I recommended for him to proceed with cycle #6 today as scheduled. I will see him back for follow-up visit in 4 weeks for evaluation after repeating CT scan of the chest, abdomen and pelvis for restaging of his disease. For the peripheral neuropathy, he will continue his current treatment with gabapentin 300 mg by mouth 3 times a day. He will also try over-the-counter deep blue oil that was followed by another patient and proved to be effective. For insomnia, he will continue his current treatment with Restoril. The patient was advised to call immediately if he has any concerning symptoms in the interval. The patient voices understanding of current disease status and treatment options and is in agreement with the current care plan. All questions were answered. The patient knows to call the clinic  with any problems, questions or concerns. We can certainly see the patient much sooner if necessary.  Disclaimer: This note was dictated with voice recognition software. Similar sounding words can inadvertently be transcribed and may not be corrected upon review.       

## 2017-07-05 NOTE — Patient Instructions (Signed)
Steps to Quit Smoking Smoking tobacco can be bad for your health. It can also affect almost every organ in your body. Smoking puts you and people around you at risk for many serious long-lasting (chronic) diseases. Quitting smoking is hard, but it is one of the best things that you can do for your health. It is never too late to quit. What are the benefits of quitting smoking? When you quit smoking, you lower your risk for getting serious diseases and conditions. They can include:  Lung cancer or lung disease.  Heart disease.  Stroke.  Heart attack.  Not being able to have children (infertility).  Weak bones (osteoporosis) and broken bones (fractures).  If you have coughing, wheezing, and shortness of breath, those symptoms may get better when you quit. You may also get sick less often. If you are pregnant, quitting smoking can help to lower your chances of having a baby of low birth weight. What can I do to help me quit smoking? Talk with your doctor about what can help you quit smoking. Some things you can do (strategies) include:  Quitting smoking totally, instead of slowly cutting back how much you smoke over a period of time.  Going to in-person counseling. You are more likely to quit if you go to many counseling sessions.  Using resources and support systems, such as: ? Online chats with a counselor. ? Phone quitlines. ? Printed self-help materials. ? Support groups or group counseling. ? Text messaging programs. ? Mobile phone apps or applications.  Taking medicines. Some of these medicines may have nicotine in them. If you are pregnant or breastfeeding, do not take any medicines to quit smoking unless your doctor says it is okay. Talk with your doctor about counseling or other things that can help you.  Talk with your doctor about using more than one strategy at the same time, such as taking medicines while you are also going to in-person counseling. This can help make  quitting easier. What things can I do to make it easier to quit? Quitting smoking might feel very hard at first, but there is a lot that you can do to make it easier. Take these steps:  Talk to your family and friends. Ask them to support and encourage you.  Call phone quitlines, reach out to support groups, or work with a counselor.  Ask people who smoke to not smoke around you.  Avoid places that make you want (trigger) to smoke, such as: ? Bars. ? Parties. ? Smoke-break areas at work.  Spend time with people who do not smoke.  Lower the stress in your life. Stress can make you want to smoke. Try these things to help your stress: ? Getting regular exercise. ? Deep-breathing exercises. ? Yoga. ? Meditating. ? Doing a body scan. To do this, close your eyes, focus on one area of your body at a time from head to toe, and notice which parts of your body are tense. Try to relax the muscles in those areas.  Download or buy apps on your mobile phone or tablet that can help you stick to your quit plan. There are many free apps, such as QuitGuide from the CDC (Centers for Disease Control and Prevention). You can find more support from smokefree.gov and other websites.  This information is not intended to replace advice given to you by your health care provider. Make sure you discuss any questions you have with your health care provider. Document Released: 07/31/2009 Document   Revised: 06/01/2016 Document Reviewed: 02/18/2015 Elsevier Interactive Patient Education  2018 [employer]  

## 2017-07-05 NOTE — Telephone Encounter (Signed)
Scheduled appt per 9/18 los - Gave patient AVS and calender per los. - per patient request lab/ct/md all on the same day - Central radiology to contact patient with ct schedule.

## 2017-07-06 ENCOUNTER — Telehealth: Payer: Self-pay

## 2017-07-06 NOTE — Telephone Encounter (Signed)
He was taking Neurontin 100 mg 4 times a day. This was changed to 300 mg tid but I found that I did changed his Rx before the visit so I did not call any new medications. It was intended for neurontin.

## 2017-07-06 NOTE — Telephone Encounter (Signed)
Pt called stating that yesterday Dr Julien Nordmann told pt he would prescribe something in place of the oxycodone. No mention was found in the OV note. Please call pt when this is addressed, he has been calling [facility] for update if rx has been sent.

## 2017-07-07 NOTE — Telephone Encounter (Signed)
Talked with pt and he has been taking gabapentin 300 mg TID since 06/14/17. His pedal neuropathy feels like it is worsening. Discussed schedule of oxycodone and he is not taking full prescription, he will try to increase it a little. He is taking "1 every 4 hours or so".

## 2017-07-08 ENCOUNTER — Encounter: Payer: BLUE CROSS/BLUE SHIELD | Admitting: Vascular Surgery

## 2017-07-08 ENCOUNTER — Encounter (HOSPITAL_COMMUNITY): Payer: BLUE CROSS/BLUE SHIELD

## 2017-07-12 ENCOUNTER — Telehealth: Payer: Self-pay

## 2017-07-12 NOTE — Telephone Encounter (Signed)
Pt called asking for home therapy for his feet. Or what else can he do for his feet? His fingers are also getting so he cannot count coins. He is using heating blankets/ heating pad/ aspercreme/ oxycodone/ neurontin.   Last taxol carbo was 07/05/17 His CT is 10/12.

## 2017-07-12 NOTE — Telephone Encounter (Signed)
Note to Mohamed. 

## 2017-07-13 ENCOUNTER — Telehealth: Payer: Self-pay | Admitting: Medical Oncology

## 2017-07-13 NOTE — Telephone Encounter (Signed)
He is already established with them. He can request follow up appointment.

## 2017-07-13 NOTE — Telephone Encounter (Signed)
-----   Message from [name], MD sent at 07/13/2017  8:34 AM EDT ----- Regarding: RE: PN Referral to Neurology. He has seen a neurologist in the past for stroke. ----- Message ----- From: Ardeen Garland, RN Sent: 07/12/2017   3:44 PM To: [name], MD Subject: PN                                             Pt called asking for home therapy for his feet. Or what else can he do for his feet? His fingers are also getting so he cannot count coins. He is using heating blankets/ heating pad/ aspercreme/ oxycodone/ neurontin.   Last taxol carbo was 07/05/17 His CT is 10/12.    Anything I need to do ? [name] [name]

## 2017-07-13 NOTE — Telephone Encounter (Signed)
Pt.notified

## 2017-07-14 ENCOUNTER — Telehealth: Payer: Self-pay

## 2017-07-14 DIAGNOSIS — G4700 Insomnia, unspecified: Secondary | ICD-10-CM

## 2017-07-14 MED ORDER — TEMAZEPAM 30 MG PO CAPS: 30.0000 mg | ORAL_CAPSULE | Freq: Every evening | ORAL | 0 refills | Status: DC | PRN

## 2017-07-14 NOTE — Telephone Encounter (Signed)
Pt is asking for temazepam refill. Done.   He is asking for a second opinion. He is asking for Dr Julien Nordmann to recommend another doctor.

## 2017-07-14 NOTE — Telephone Encounter (Signed)
Per Julien Nordmann I instructed to to contact his neurologist.

## 2017-07-14 NOTE — Telephone Encounter (Signed)
Pt called that his neurologist cannot see him until Jan 28. Does Dr Julien Nordmann have another suggestion?

## 2017-07-15 ENCOUNTER — Telehealth: Payer: Self-pay | Admitting: *Deleted

## 2017-07-15 DIAGNOSIS — C7800 Secondary malignant neoplasm of unspecified lung: Secondary | ICD-10-CM

## 2017-07-15 NOTE — Telephone Encounter (Signed)
"  This is [name] [name] calling in reference to referral for neurologist for the problem I'm having with my feet.  Could you make an appointment for me with a provider at the [facility] in [place], [place]?  Send a referral to a provider there.  Call me [phone] so I'll know you received this message."  Confirmed receipt of patient's request.   "He wants me to see a neurologist for the tingling I have in my feet that started when I began receiving chemotherapy.  No change since my visit there on 07-05-2017.  The first available appointment is November 14, 2017 to see the neurologist on [facility]. in [place].  I cannot wait four months.  Experience tingling all the time, twenty-four hours a day, seven days a week.  Feet feel like they are asleep making it rough to walk.  I stumble at times but I have not fallen.  I use a heating pad at night which helps relieve the pain.  [facility] is located on [address] [place], [place], [place], [phone].  I do not have a provider preference or know the names of any physicians there.  No, I did not have this tingling before chemotherapy."    Routing call information to collaborative nurse and provider for review.  Further patient communication through collaborative nurse.  With this call reviewed hand-foot syndrome precautions.

## 2017-07-15 NOTE — Telephone Encounter (Signed)
Ok to refer him.

## 2017-07-18 ENCOUNTER — Encounter: Payer: Self-pay | Admitting: Surgery

## 2017-07-18 ENCOUNTER — Ambulatory Visit (HOSPITAL_COMMUNITY)
Admission: RE | Admit: 2017-07-18 | Discharge: 2017-07-18 | Disposition: A | Discharge status: Home / Self Care | Payer: BLUE CROSS/BLUE SHIELD | Source: Ambulatory Visit | Attending: Surgery | Admitting: Surgery

## 2017-07-18 ENCOUNTER — Ambulatory Visit (INDEPENDENT_AMBULATORY_CARE_PROVIDER_SITE_OTHER): Payer: BLUE CROSS/BLUE SHIELD | Admitting: Surgery

## 2017-07-18 VITALS — BP 130/81 | HR 78 | Temp 98.8°F | Resp 16 | Ht 68.0 in | Wt 212.4 lb

## 2017-07-18 DIAGNOSIS — I6523 Occlusion and stenosis of bilateral carotid arteries: Secondary | ICD-10-CM

## 2017-07-18 DIAGNOSIS — I6521 Occlusion and stenosis of right carotid artery: Secondary | ICD-10-CM | POA: Diagnosis present

## 2017-07-18 LAB — VAS US CAROTID
LCCADSYS: -75 cm/s
LEFT ECA DIAS: -16 cm/s
LEFT VERTEBRAL DIAS: 26 cm/s
LICADDIAS: -48 cm/s
LICAPDIAS: -45 cm/s
LICAPSYS: -123 cm/s
Left CCA dist dias: -28 cm/s
Left CCA prox dias: 35 cm/s
Left CCA prox sys: 104 cm/s
Left ICA dist sys: -97 cm/s
RCCAPDIAS: 5 cm/s
RCCAPSYS: 55 cm/s
RIGHT CCA MID DIAS: 0 cm/s
RIGHT ECA DIAS: -21 cm/s
RIGHT VERTEBRAL DIAS: -17 cm/s
Right cca dist sys: 0 cm/s

## 2017-07-18 MED ORDER — SIMVASTATIN 5 MG PO TABS: 5.0000 mg | ORAL_TABLET | Freq: Every day | ORAL | 3 refills | Status: DC

## 2017-07-18 NOTE — Progress Notes (Signed)
[facility] [place]  Patient name: [name] [name] MRN: [id number] DOB: 1951/03/27 Sex: male   REQUESTING PROVIDER:    Dr. Woody Seller   REASON FOR CONSULT:    Carotid disease  HISTORY OF PRESENT ILLNESS:   [name] [name] is a 66 y.o. male, who is Referred today for evaluation of carotid occlusive disease.  Approximately 2 months ago, the patient developed vision changes in his right eye.  This lasted for approximately a few hours.  He was found to have an occluded right carotid artery.  The left side was in the 1-50 percent range.  The patient tells me that he had a ultrasound earlier in the year that showed only mild stenosis bilaterally.  The patient did not have any other symptoms.  Patient is currently being treated for brain metastasis secondary to squamous cell lung cancer.  He is currently treated for hypertension with an ACE inhibitor.  He is a current smoker.  PAST MEDICAL HISTORY    Past Medical History:  Diagnosis Date  . Brain metastases ([place]) 02/15/2017  . Depression 01/29/2017  . Dyspnea   . Encounter for antineoplastic chemotherapy 01/29/2017  . Goals of care, counseling/discussion 01/29/2017  . Hypertension   . Metastatic squamous cell carcinoma to lung, unspecified laterality ([place]) 01/28/2017  . Stroke [facility])      FAMILY HISTORY   Family History  Problem Relation Age of Onset  . Cancer Father        unsure of what type    SOCIAL HISTORY:   Social History   Social History  . Marital status: Single    Spouse name: N/A  . Number of children: 0  . Years of education: 10   Occupational History  .      NA   Social History Main Topics  . Smoking status: Current Every Day Smoker    Packs/day: 0.50    Years: 45.00    Types: Cigarettes  . Smokeless tobacco: Never Used  . Alcohol use No  . Drug use: Yes    Types: Marijuana     Comment: daily  . Sexual activity: Not on file   Other Topics Concern  .  Not on file   Social History Narrative   Lives alone   Caffeine- coffee, 5 cups daily    ALLERGIES:    Allergies  Allergen Reactions  . No Known Allergies     CURRENT MEDICATIONS:    Current Outpatient Prescriptions  Medication Sig Dispense Refill  . acetaminophen (TYLENOL) 325 MG tablet You can take 2 every 4 hours as needed.  You can buy it over the counter DO NOT TAKE MORE THAN 4000 MG OF TYLENOL PER DAY.  IT CAN HARM YOUR LIVER.    Marland Kitchen gabapentin (NEURONTIN) 300 MG capsule Take 1 capsule (300 mg total) by mouth 3 (three) times daily. 90 capsule 1  . hydrochlorothiazide (HYDRODIURIL) 25 MG tablet Restart this after you see your Primary care doctor.  Check your blood pressure at home and record.    Marland Kitchen lisinopril (PRINIVIL,ZESTRIL) 40 MG tablet Take 40 mg by mouth daily.    . metoprolol succinate (TOPROL-XL) 50 MG 24 hr tablet 50 mg.  0  . mirtazapine (REMERON) 30 MG tablet Take 1 tablet (30 mg total) by mouth at bedtime. 30 tablet 1  . pantoprazole (PROTONIX) 40 MG tablet Take 1 tablet (40 mg total) by mouth 2 (two) times daily. 60 tablet 1  . temazepam (RESTORIL) 30 MG  capsule Take 1 capsule (30 mg total) by mouth at bedtime as needed for sleep. 30 capsule 0   No current facility-administered medications for this visit.     REVIEW OF SYSTEMS:   [X]  denotes positive finding, [ ]  denotes negative finding Cardiac  Comments:  Chest pain or chest pressure:    Shortness of breath upon exertion: x   Short of breath when lying flat:    Irregular heart rhythm:        Vascular    Pain in calf, thigh, or hip brought on by ambulation:    Pain in feet at night that wakes you up from your sleep:  x   Blood clot in your veins: x   Leg swelling:         Pulmonary    Oxygen at home:    Productive cough:     Wheezing:  x       Neurologic    Sudden weakness in arms or legs:     Sudden numbness in arms or legs:     Sudden onset of difficulty speaking or slurred speech:      Temporary loss of vision in one eye:     Problems with dizziness:         Gastrointestinal    Blood in stool:      Vomited blood:         Genitourinary    Burning when urinating:     Blood in urine:        Psychiatric    Major depression:         Hematologic    Bleeding problems:    Problems with blood clotting too easily:        Skin    Rashes or ulcers:        Constitutional    Fever or chills:     PHYSICAL EXAM:   Vitals:   07/18/17 1329  BP: 130/81  Pulse: 78  Resp: 16  Temp: 98.8 F (37.1 C)  TempSrc: Oral  SpO2: 95%  Weight: 212 lb 6.4 oz (96.3 kg)  Height: 5\' 8"  (1.727 m)    GENERAL: The patient is a well-nourished male, in no acute distress. The vital signs are documented above. CARDIAC: There is a regular rate and rhythm.  VASCULAR: no carotid bruits PULMONARY: Nonlabored respirations  MUSCULOSKELETAL: There are no major deformities or cyanosis. NEUROLOGIC: No focal weakness or paresthesias are detected. SKIN: There are no ulcers or rashes noted. PSYCHIATRIC: The patient has a normal affect.  STUDIES:   I have reviewed his outside carotid duplex which shows an occluded right carotid artery and 1-50 percent left carotid stenosis.  The study was repeated in her office.  This confirms right carotid occlusion and 40-59 percent left carotid stenosis  ASSESSMENT and PLAN   Occluded right carotid artery in the setting of approximately 50% left carotid stenosis which is asymptomatic: I explained to the patient that his asymptomatic left carotid stenosis with be treated with maximal medical treatment up until it becomes greater than 80%, at which time we would consider endarterectomy or stenting.  I am going to add a statin to his medical regimen.  He has recently changed primary care physicians that he does not have the name of his position.  I have asked him to notify his physician that he will be starting low-dose statin which could be titrated up if need  be.  I have scheduled for follow-up with me in 6  months with a repeat carotid duplex.   Annamarie Major, MD [facility] [phone] Pager [phone]

## 2017-07-18 NOTE — Telephone Encounter (Signed)
Neurology refer entered to outside provider in [place] per pt request.

## 2017-07-19 ENCOUNTER — Telehealth: Payer: Self-pay | Admitting: Internal Medicine

## 2017-07-19 NOTE — Addendum Note (Signed)
Addended by: Lianne Cure A on: 07/19/2017 12:56 PM   Modules accepted: Orders

## 2017-07-19 NOTE — Telephone Encounter (Signed)
Faxed labs to [facility]

## 2017-07-29 ENCOUNTER — Ambulatory Visit (HOSPITAL_COMMUNITY)
Admission: RE | Admit: 2017-07-29 | Discharge: 2017-07-29 | Disposition: A | Discharge status: Home / Self Care | Payer: BLUE CROSS/BLUE SHIELD | Source: Ambulatory Visit | Attending: Internal Medicine | Admitting: Internal Medicine

## 2017-07-29 ENCOUNTER — Encounter (HOSPITAL_COMMUNITY): Payer: Self-pay

## 2017-07-29 DIAGNOSIS — I700 Atherosclerosis of aorta: Secondary | ICD-10-CM | POA: Diagnosis not present

## 2017-07-29 DIAGNOSIS — Z5111 Encounter for antineoplastic chemotherapy: Secondary | ICD-10-CM

## 2017-07-29 DIAGNOSIS — C7800 Secondary malignant neoplasm of unspecified lung: Secondary | ICD-10-CM | POA: Diagnosis not present

## 2017-07-29 DIAGNOSIS — I714 Abdominal aortic aneurysm, without rupture: Secondary | ICD-10-CM | POA: Insufficient documentation

## 2017-07-29 DIAGNOSIS — D3502 Benign neoplasm of left adrenal gland: Secondary | ICD-10-CM | POA: Insufficient documentation

## 2017-07-29 DIAGNOSIS — G609 Hereditary and idiopathic neuropathy, unspecified: Secondary | ICD-10-CM | POA: Diagnosis not present

## 2017-07-29 DIAGNOSIS — D71 Functional disorders of polymorphonuclear neutrophils: Secondary | ICD-10-CM | POA: Diagnosis not present

## 2017-07-29 DIAGNOSIS — K4090 Unilateral inguinal hernia, without obstruction or gangrene, not specified as recurrent: Secondary | ICD-10-CM | POA: Insufficient documentation

## 2017-07-29 DIAGNOSIS — C3490 Malignant neoplasm of unspecified part of unspecified bronchus or lung: Secondary | ICD-10-CM

## 2017-07-29 DIAGNOSIS — J439 Emphysema, unspecified: Secondary | ICD-10-CM | POA: Insufficient documentation

## 2017-07-29 DIAGNOSIS — C7931 Secondary malignant neoplasm of brain: Secondary | ICD-10-CM

## 2017-07-29 MED ORDER — IOPAMIDOL (ISOVUE-300) INJECTION 61%
INTRAVENOUS | Status: AC
  Filled 2017-07-29: qty 100

## 2017-07-29 MED ORDER — IOPAMIDOL (ISOVUE-300) INJECTION 61%
100.0000 mL | Freq: Once | INTRAVENOUS | Status: AC | PRN
  Administered 2017-07-29: 100 mL via INTRAVENOUS

## 2017-08-02 ENCOUNTER — Encounter: Payer: Self-pay | Admitting: Internal Medicine

## 2017-08-02 ENCOUNTER — Inpatient Hospital Stay: Payer: BLUE CROSS/BLUE SHIELD | Attending: Internal Medicine | Admitting: Internal Medicine

## 2017-08-02 ENCOUNTER — Inpatient Hospital Stay (HOSPITAL_BASED_OUTPATIENT_CLINIC_OR_DEPARTMENT_OTHER): Payer: BLUE CROSS/BLUE SHIELD

## 2017-08-02 VITALS — BP 145/89 | HR 66 | Temp 98.7°F | Resp 18 | Ht 68.0 in | Wt 209.3 lb

## 2017-08-02 DIAGNOSIS — C7800 Secondary malignant neoplasm of unspecified lung: Secondary | ICD-10-CM

## 2017-08-02 DIAGNOSIS — G4700 Insomnia, unspecified: Secondary | ICD-10-CM | POA: Diagnosis not present

## 2017-08-02 DIAGNOSIS — C7931 Secondary malignant neoplasm of brain: Secondary | ICD-10-CM

## 2017-08-02 DIAGNOSIS — G620 Drug-induced polyneuropathy: Secondary | ICD-10-CM

## 2017-08-02 DIAGNOSIS — F329 Major depressive disorder, single episode, unspecified: Secondary | ICD-10-CM

## 2017-08-02 DIAGNOSIS — G609 Hereditary and idiopathic neuropathy, unspecified: Secondary | ICD-10-CM

## 2017-08-02 DIAGNOSIS — I10 Essential (primary) hypertension: Secondary | ICD-10-CM

## 2017-08-02 DIAGNOSIS — Z5111 Encounter for antineoplastic chemotherapy: Secondary | ICD-10-CM

## 2017-08-02 DIAGNOSIS — C3490 Malignant neoplasm of unspecified part of unspecified bronchus or lung: Secondary | ICD-10-CM | POA: Diagnosis not present

## 2017-08-02 LAB — CBC WITH DIFFERENTIAL/PLATELET
BASO%: 0.3 % (ref 0.0–2.0)
BASOS ABS: 0 10*3/uL (ref 0.0–0.1)
EOS ABS: 0.2 10*3/uL (ref 0.0–0.5)
EOS%: 2.6 % (ref 0.0–7.0)
HEMATOCRIT: 39.7 % (ref 38.4–49.9)
HEMOGLOBIN: 13.2 g/dL (ref 13.0–17.1)
LYMPH#: 4.1 10*3/uL — AB (ref 0.9–3.3)
LYMPH%: 56.5 % — ABNORMAL HIGH (ref 14.0–49.0)
MCH: 31.5 pg (ref 27.2–33.4)
MCHC: 33.2 g/dL (ref 32.0–36.0)
MCV: 94.7 fL (ref 79.3–98.0)
MONO#: 0.6 10*3/uL (ref 0.1–0.9)
MONO%: 8.8 % (ref 0.0–14.0)
NEUT#: 2.3 10*3/uL (ref 1.5–6.5)
NEUT%: 31.8 % — AB (ref 39.0–75.0)
Platelets: 235 10*3/uL (ref 140–400)
RBC: 4.19 10*6/uL — ABNORMAL LOW (ref 4.20–5.82)
RDW: 16.1 % — ABNORMAL HIGH (ref 11.0–14.6)
WBC: 7.3 10*3/uL (ref 4.0–10.3)

## 2017-08-02 LAB — COMPREHENSIVE METABOLIC PANEL
ALBUMIN: 4 g/dL (ref 3.5–5.0)
ALK PHOS: 65 U/L (ref 40–150)
ALT: 12 U/L (ref 0–55)
AST: 14 U/L (ref 5–34)
Anion Gap: 7 mEq/L (ref 3–11)
BUN: 11.1 mg/dL (ref 7.0–26.0)
CALCIUM: 9.5 mg/dL (ref 8.4–10.4)
CHLORIDE: 105 meq/L (ref 98–109)
CO2: 27 mEq/L (ref 22–29)
Creatinine: 0.9 mg/dL (ref 0.7–1.3)
Glucose: 112 mg/dl (ref 70–140)
POTASSIUM: 4.4 meq/L (ref 3.5–5.1)
Sodium: 139 mEq/L (ref 136–145)
Total Bilirubin: 0.55 mg/dL (ref 0.20–1.20)
Total Protein: 7.1 g/dL (ref 6.4–8.3)

## 2017-08-02 NOTE — Progress Notes (Signed)
[facility] Telephone:([phone]) [phone]   Fax:([phone]) [phone]  OFFICE PROGRESS NOTE  [name], MD [place] [address]  DIAGNOSIS: Stage IV (T3, N0, M1 a) non-small cell lung cancer, squamous cell carcinoma presented with multiple bilateral pulmonary nodules diagnosed in April 2018.  PRIOR THERAPY:  Systemic chemotherapy with carboplatin for AUC of 5 and paclitaxel 175 MG/M2 every 3 weeks with Neulasta support. Status post 6 cycles.  CURRENT THERAPY: Observation.  INTERVAL HISTORY: [name] [name] 66 y.o. male returns to the clinic today for follow-up visit accompanied by his girlfriend. The patient tolerated the last cycle of his treatment with carboplatin and paclitaxel fairly well except for the persistent peripheral neuropathy. He is currently on gabapentin 300 mg by mouth 3 times a day. He denied having any significant chest pain, shortness of breath, cough or hemoptysis. The patient denied having any recent weight loss or night sweats. He has no nausea, vomiting, diarrhea or constipation. He had repeat CT scan of the chest, abdomen and pelvis performed recently and he is here for evaluation and discussion of his scan results.   MEDICAL HISTORY: Past Medical History:  Diagnosis Date  . Brain metastases ([place]) 02/15/2017  . Depression 01/29/2017  . Dyspnea   . Encounter for antineoplastic chemotherapy 01/29/2017  . Goals of care, counseling/discussion 01/29/2017  . Hypertension   . Metastatic squamous cell carcinoma to lung, unspecified laterality ([place]) 01/28/2017  . Stroke [facility])     ALLERGIES:  is allergic to no known allergies.  MEDICATIONS:  Current Outpatient Prescriptions  Medication Sig Dispense Refill  . acetaminophen (TYLENOL) 325 MG tablet You can take 2 every 4 hours as needed.  You can buy it over the counter DO NOT TAKE MORE THAN 4000 MG OF TYLENOL PER DAY.  IT CAN HARM YOUR LIVER.    Marland Kitchen gabapentin (NEURONTIN) 300 MG capsule Take 1 capsule  (300 mg total) by mouth 3 (three) times daily. 90 capsule 1  . hydrochlorothiazide (HYDRODIURIL) 25 MG tablet Restart this after you see your Primary care doctor.  Check your blood pressure at home and record.    Marland Kitchen lisinopril (PRINIVIL,ZESTRIL) 40 MG tablet Take 40 mg by mouth daily.    . metoprolol succinate (TOPROL-XL) 50 MG 24 hr tablet 50 mg.  0  . mirtazapine (REMERON) 30 MG tablet Take 1 tablet (30 mg total) by mouth at bedtime. 30 tablet 1  . pantoprazole (PROTONIX) 40 MG tablet Take 1 tablet (40 mg total) by mouth 2 (two) times daily. 60 tablet 1  . simvastatin (ZOCOR) 5 MG tablet Take 1 tablet (5 mg total) by mouth daily. 5 tablet 3  . temazepam (RESTORIL) 30 MG capsule Take 1 capsule (30 mg total) by mouth at bedtime as needed for sleep. 30 capsule 0   No current facility-administered medications for this visit.     SURGICAL HISTORY:  Past Surgical History:  Procedure Laterality Date  . BACK SURGERY     cyst removal  . COLONOSCOPY    . LAPAROTOMY N/A 02/25/2017   Procedure: EXPLORATORY LAPAROTOMY MODIFIED GRAHAM'S PATCH;  Surgeon: Kinsinger, Arta Bruce, MD;  Location: [facility];  Service: General;  Laterality: N/A;  . MINOR PLACEMENT OF FIDUCIAL N/A 01/19/2017   Procedure: MINOR PLACEMENT OF FIDUCIAL x 6;  Surgeon: Collene Gobble, MD;  Location: [place];  Service: Thoracic;  Laterality: N/A;  . VIDEO BRONCHOSCOPY WITH ENDOBRONCHIAL NAVIGATION N/A 01/19/2017   Procedure: VIDEO BRONCHOSCOPY WITH ENDOBRONCHIAL  NAVIGATION;  Surgeon: Collene Gobble, MD;  Location: [facility];  Service: Thoracic;  Laterality: N/A;    REVIEW OF SYSTEMS:  Constitutional: positive for fatigue Eyes: negative Ears, nose, mouth, throat, and face: negative Respiratory: negative Cardiovascular: negative Gastrointestinal: negative Genitourinary:negative Integument/breast: negative Hematologic/lymphatic: negative Musculoskeletal:negative Neurological: positive for paresthesia Behavioral/Psych: negative Endocrine:  negative Allergic/Immunologic: negative   PHYSICAL EXAMINATION: General appearance: alert, cooperative and no distress Head: Normocephalic, without obvious abnormality, atraumatic Neck: no adenopathy, no JVD, supple, symmetrical, trachea midline and thyroid not enlarged, symmetric, no tenderness/mass/nodules Lymph nodes: Cervical, supraclavicular, and axillary nodes normal. Resp: clear to auscultation bilaterally Back: symmetric, no curvature. ROM normal. No CVA tenderness. Cardio: regular rate and rhythm, S1, S2 normal, no murmur, click, rub or gallop GI: soft, non-tender; bowel sounds normal; no masses,  no organomegaly Extremities: extremities normal, atraumatic, no cyanosis or edema Neurologic: Alert and oriented X 3, normal strength and tone. Normal symmetric reflexes. Normal coordination and gait  ECOG PERFORMANCE STATUS: 1 - Symptomatic but completely ambulatory  Blood pressure (!) 145/89, pulse 66, temperature 98.7 F (37.1 C), temperature source Oral, resp. rate 18, height 5\' 8"  (1.727 m), weight 209 lb 4.8 oz (94.9 kg), SpO2 97 %.  LABORATORY DATA: Lab Results  Component Value Date   WBC 7.3 08/02/2017   HGB 13.2 08/02/2017   HCT 39.7 08/02/2017   MCV 94.7 08/02/2017   PLT 235 08/02/2017      Chemistry      Component Value Date/Time   NA 139 08/02/2017 1232   K 4.4 08/02/2017 1232   CL 99 (L) 03/03/2017 0408   CO2 27 08/02/2017 1232   BUN 11.1 08/02/2017 1232   CREATININE 0.9 08/02/2017 1232      Component Value Date/Time   CALCIUM 9.5 08/02/2017 1232   ALKPHOS 65 08/02/2017 1232   AST 14 08/02/2017 1232   ALT 12 08/02/2017 1232   BILITOT 0.55 08/02/2017 1232       RADIOGRAPHIC STUDIES: Ct Chest W Contrast  Result Date: 07/29/2017 CLINICAL DATA:  Metastatic right upper lobe non-small cell lung cancer restaging. Shortness of breath. Perforated pyloric channel ulcer with modified Graham patch procedure on 02/25/2017. EXAM: CT CHEST, ABDOMEN, AND PELVIS  WITH CONTRAST TECHNIQUE: Multidetector CT imaging of the chest, abdomen and pelvis was performed following the standard protocol during bolus administration of intravenous contrast. CONTRAST:  116mL ISOVUE-300 IOPAMIDOL (ISOVUE-300) INJECTION 61% COMPARISON:  Multiple exams, including 05/20/2017 FINDINGS: CT CHEST FINDINGS Cardiovascular: Atherosclerotic calcification of the thoracic aorta and branch vessels. Mediastinum/Nodes: Lower right paratracheal node 0.9 cm in short axis on image 28/2, formerly 0.7 cm. Several adjacent right hilar lymph nodes are each in the 5-7 mm short axis range. Left infrahilar node 0.7 cm in short axis on image 34/2. Lungs/Pleura: Biapical pleuroparenchymal scarring. Emphysema noted. Scattered calcified granulomas in the lungs. 1.3 by 0.7 cm right upper lobe nodule on image 46/4, previously by my measurements 1.5 by 0.8 cm. Nodular thickening around focal bronchiectasis anteriorly in the right upper lobe the with associated nodule measuring about 1.2 by 0.9 cm, previously 1.5 by 1.2 cm. Adjacent airway plugging is no longer present. Airway thickening is present, suggesting bronchitis or reactive airways disease. Airway plugging is present in the right lower lobe. Suspected fiducials in the left upper lobe not appreciably changed from prior. The 5 by 7 mm left upper lobe nodule on image 66/4, previously 6 by 8 mm. The left upper lobe irregular nodule 2.2 by 1.4 cm on image 78/4, formerly  the same by my measurement. The this nodule is likewise peribronchovascular in distribution. 5 mm subpleural nodule in the left lower lobe on image 109/4, stable. Musculoskeletal: Mild thoracic spondylosis. CT ABDOMEN PELVIS FINDINGS Hepatobiliary: Unremarkable Pancreas: Unremarkable Spleen: Unremarkable Adrenals/Urinary Tract: Low-density fullness of both adrenal glands. 1.3 by 2.7 cm left adrenal adenoma. Scarring of the right kidney upper pole. No urinary tract calculi. The kidneys appear otherwise  unremarkable. Stomach/Bowel: Sigmoid colon diverticulosis. Vascular/Lymphatic: Infrarenal abdominal aortic aneurysm 3.1 cm in anterior-posterior dimension. Aortoiliac atherosclerotic vascular disease. No pathologic adenopathy. Reproductive: Unremarkable Other: No supplemental non-categorized findings. Musculoskeletal: Bridging spurring of both sacroiliac joints. Direct right inguinal hernia contains adipose tissue. IMPRESSION: 1. The bilateral pulmonary nodules are generally slightly reduced in size compared to the prior exam. No new adenopathy. 2. 3.1 cm infrarenal abdominal aortic aneurysm. Recommend followup by ultrasound in 3 years. This recommendation follows ACR consensus guidelines: White Paper of the ACR Incidental Findings Committee II on Vascular Findings. J Am Coll Radiol 2013; 35:361-443 3. Aortic Atherosclerosis (ICD10-I70.0) and Emphysema (ICD10-J43.9). 4. Other imaging findings of potential clinical significance: Small left adrenal adenoma. Low-density fullness of both adrenal glands. Scarring in the right kidney upper pole. Direct right inguinal hernia contains adipose tissue. Old granulomatous disease. Electronically Signed   By: Van Clines M.D.   On: 07/29/2017 14:23   Ct Abdomen Pelvis W Contrast  Result Date: 07/29/2017 CLINICAL DATA:  Metastatic right upper lobe non-small cell lung cancer restaging. Shortness of breath. Perforated pyloric channel ulcer with modified Graham patch procedure on 02/25/2017. EXAM: CT CHEST, ABDOMEN, AND PELVIS WITH CONTRAST TECHNIQUE: Multidetector CT imaging of the chest, abdomen and pelvis was performed following the standard protocol during bolus administration of intravenous contrast. CONTRAST:  177mL ISOVUE-300 IOPAMIDOL (ISOVUE-300) INJECTION 61% COMPARISON:  Multiple exams, including 05/20/2017 FINDINGS: CT CHEST FINDINGS Cardiovascular: Atherosclerotic calcification of the thoracic aorta and branch vessels. Mediastinum/Nodes: Lower right  paratracheal node 0.9 cm in short axis on image 28/2, formerly 0.7 cm. Several adjacent right hilar lymph nodes are each in the 5-7 mm short axis range. Left infrahilar node 0.7 cm in short axis on image 34/2. Lungs/Pleura: Biapical pleuroparenchymal scarring. Emphysema noted. Scattered calcified granulomas in the lungs. 1.3 by 0.7 cm right upper lobe nodule on image 46/4, previously by my measurements 1.5 by 0.8 cm. Nodular thickening around focal bronchiectasis anteriorly in the right upper lobe the with associated nodule measuring about 1.2 by 0.9 cm, previously 1.5 by 1.2 cm. Adjacent airway plugging is no longer present. Airway thickening is present, suggesting bronchitis or reactive airways disease. Airway plugging is present in the right lower lobe. Suspected fiducials in the left upper lobe not appreciably changed from prior. The 5 by 7 mm left upper lobe nodule on image 66/4, previously 6 by 8 mm. The left upper lobe irregular nodule 2.2 by 1.4 cm on image 78/4, formerly the same by my measurement. The this nodule is likewise peribronchovascular in distribution. 5 mm subpleural nodule in the left lower lobe on image 109/4, stable. Musculoskeletal: Mild thoracic spondylosis. CT ABDOMEN PELVIS FINDINGS Hepatobiliary: Unremarkable Pancreas: Unremarkable Spleen: Unremarkable Adrenals/Urinary Tract: Low-density fullness of both adrenal glands. 1.3 by 2.7 cm left adrenal adenoma. Scarring of the right kidney upper pole. No urinary tract calculi. The kidneys appear otherwise unremarkable. Stomach/Bowel: Sigmoid colon diverticulosis. Vascular/Lymphatic: Infrarenal abdominal aortic aneurysm 3.1 cm in anterior-posterior dimension. Aortoiliac atherosclerotic vascular disease. No pathologic adenopathy. Reproductive: Unremarkable Other: No supplemental non-categorized findings. Musculoskeletal: Bridging spurring of both sacroiliac joints. Direct right  inguinal hernia contains adipose tissue. IMPRESSION: 1. The bilateral  pulmonary nodules are generally slightly reduced in size compared to the prior exam. No new adenopathy. 2. 3.1 cm infrarenal abdominal aortic aneurysm. Recommend followup by ultrasound in 3 years. This recommendation follows ACR consensus guidelines: White Paper of the ACR Incidental Findings Committee II on Vascular Findings. J Am Coll Radiol 2013; 40:768-088 3. Aortic Atherosclerosis (ICD10-I70.0) and Emphysema (ICD10-J43.9). 4. Other imaging findings of potential clinical significance: Small left adrenal adenoma. Low-density fullness of both adrenal glands. Scarring in the right kidney upper pole. Direct right inguinal hernia contains adipose tissue. Old granulomatous disease. Electronically Signed   By: Van Clines M.D.   On: 07/29/2017 14:23    ASSESSMENT AND PLAN:  This is a very pleasant 66 years old white male with stage IV non-small cell lung cancer, squamous cell carcinoma presented with multiple bilateral pulmonary nodules. The patient was treated with systemic chemotherapy with carboplatin and paclitaxel status post 6 cycles. He tolerated the last cycle of his treatment well except for the persistent peripheral neuropathy. He had repeat CT scan of the chest, abdomen and pelvis performed recently. I personally and independently reviewed the scan images and discuss the Scan results with the patient and his girlfriend. His scan showed no evidence for disease progression. I recommended for the patient to continue on observation for now with repeat CT scan of the chest in 3 months. For the peripheral neuropathy, I recommended for the patient to continue his current treatment with gabapentin. For depression, she will continue on Remeron 30 mg by mouth daily at bedtime. For insomnia was advised to use Tylenol PM if needed The patient was advised to call immediately if he has any concerning symptoms in the interval. The patient voices understanding of current disease status and treatment  options and is in agreement with the current care plan. All questions were answered. The patient knows to call the clinic with any problems, questions or concerns. We can certainly see the patient much sooner if necessary.  Disclaimer: This note was dictated with voice recognition software. Similar sounding words can inadvertently be transcribed and may not be corrected upon review.

## 2017-08-03 ENCOUNTER — Telehealth: Payer: Self-pay | Admitting: Internal Medicine

## 2017-08-03 NOTE — Telephone Encounter (Signed)
Scheduled appt per 10/16 los -  Patient is aware and reminder letter sent in the mail.

## 2017-08-15 ENCOUNTER — Telehealth: Payer: Self-pay | Admitting: Internal Medicine

## 2017-08-15 NOTE — Telephone Encounter (Signed)
Called and spoke with patient on 08/15/2017 @ 11:38 am regarding cover sheet he filled out for FMLA on 07/18/2017 with an attachment of a letter from his employer on extension of his FMLA.  Patient informed me that he exhausted his 26 weeks for the year and to disregard the request.  Patient also asked my send letter back to the following address:  [address] [place] [address]

## 2017-08-19 ENCOUNTER — Other Ambulatory Visit: Payer: Self-pay | Admitting: *Deleted

## 2017-08-19 ENCOUNTER — Telehealth: Payer: Self-pay | Admitting: *Deleted

## 2017-08-19 DIAGNOSIS — C7931 Secondary malignant neoplasm of brain: Secondary | ICD-10-CM

## 2017-08-19 DIAGNOSIS — C7800 Secondary malignant neoplasm of unspecified lung: Secondary | ICD-10-CM

## 2017-08-19 DIAGNOSIS — Z5111 Encounter for antineoplastic chemotherapy: Secondary | ICD-10-CM

## 2017-08-19 MED ORDER — GABAPENTIN 300 MG PO CAPS: 300.0000 mg | ORAL_CAPSULE | Freq: Three times a day (TID) | ORAL | 1 refills | Status: DC

## 2017-08-19 NOTE — Telephone Encounter (Signed)
Pt called requesting refill of Gabapentin.  Stanton Kidney, RN desk nurse notified.  OK to refill per desk nurse.  Script escribed to pt's pharmacy per his request.  Spoke with pt and informed him of above info. Pt's    Phone     [phone]

## 2017-08-23 ENCOUNTER — Telehealth: Payer: Self-pay

## 2017-08-23 NOTE — Telephone Encounter (Signed)
Called pt, who was at the Dentist. LM with Wife/significant other to inform pt :Per MD, Pt will not need labs every Tuesday. Pt will have an MD F/U in 3 months with CTscan prior. No further concerns.

## 2017-08-23 NOTE — Telephone Encounter (Signed)
Pt is asking if he needs to have labs drawn today. He has been going every Tuesday for the last month. Been using [facility] in [place].

## 2017-09-02 ENCOUNTER — Telehealth: Payer: Self-pay

## 2017-09-02 NOTE — Telephone Encounter (Signed)
Pt called that he needs to move his CT from January to December for insurance purposes. Gave him the # for central scheduling. He will call them and come by [facility] for his contrast. His lab appt will need to be moved.  inbasket sent to darlena for prior auth.

## 2017-09-16 ENCOUNTER — Telehealth: Payer: Self-pay

## 2017-09-16 NOTE — Telephone Encounter (Signed)
Pt called about getting CT contrast up in [place]. He was trying to keep from having to drive from [place]. Suggested he could come at 1200 on day of CT to get contrast and stay at [facility] or radiology for 2 hours drinking the contrast. Or come another day to pick up contrast.

## 2017-09-20 ENCOUNTER — Telehealth: Payer: Self-pay | Admitting: *Deleted

## 2017-09-20 DIAGNOSIS — C7931 Secondary malignant neoplasm of brain: Secondary | ICD-10-CM

## 2017-09-20 DIAGNOSIS — Z5111 Encounter for antineoplastic chemotherapy: Secondary | ICD-10-CM

## 2017-09-20 DIAGNOSIS — C7800 Secondary malignant neoplasm of unspecified lung: Secondary | ICD-10-CM

## 2017-09-20 NOTE — Telephone Encounter (Signed)
Ok to increase neurontin to 300 mg 4 times /day

## 2017-09-20 NOTE — Telephone Encounter (Signed)
Pt called states " my feet are freezing and they are tingling, My Podiatrist in [place] recommends I increase my Neurontin." Pt currently takes neurontin 300mg  TID. Will review with MD and call pt with more information.

## 2017-09-21 MED ORDER — GABAPENTIN 300 MG PO CAPS: 300.0000 mg | ORAL_CAPSULE | Freq: Four times a day (QID) | ORAL | 1 refills | Status: DC

## 2017-09-21 NOTE — Telephone Encounter (Signed)
Notified pt to increase Neurontin to 300mg  4x day. Rx sent to [facility]

## 2017-09-22 ENCOUNTER — Telehealth: Payer: Self-pay | Admitting: Internal Medicine

## 2017-09-22 NOTE — Telephone Encounter (Signed)
Called patient regarding upcoming appointment

## 2017-10-06 ENCOUNTER — Ambulatory Visit (HOSPITAL_COMMUNITY)
Admission: RE | Admit: 2017-10-06 | Discharge: 2017-10-06 | Disposition: A | Discharge status: Home / Self Care | Payer: BLUE CROSS/BLUE SHIELD | Source: Ambulatory Visit | Attending: Internal Medicine | Admitting: Internal Medicine

## 2017-10-06 ENCOUNTER — Encounter (HOSPITAL_COMMUNITY): Payer: Self-pay

## 2017-10-06 ENCOUNTER — Other Ambulatory Visit (HOSPITAL_BASED_OUTPATIENT_CLINIC_OR_DEPARTMENT_OTHER): Payer: BLUE CROSS/BLUE SHIELD

## 2017-10-06 DIAGNOSIS — I700 Atherosclerosis of aorta: Secondary | ICD-10-CM | POA: Diagnosis not present

## 2017-10-06 DIAGNOSIS — F329 Major depressive disorder, single episode, unspecified: Secondary | ICD-10-CM | POA: Insufficient documentation

## 2017-10-06 DIAGNOSIS — I10 Essential (primary) hypertension: Secondary | ICD-10-CM | POA: Diagnosis not present

## 2017-10-06 DIAGNOSIS — C7800 Secondary malignant neoplasm of unspecified lung: Secondary | ICD-10-CM

## 2017-10-06 DIAGNOSIS — C3490 Malignant neoplasm of unspecified part of unspecified bronchus or lung: Secondary | ICD-10-CM | POA: Insufficient documentation

## 2017-10-06 DIAGNOSIS — G609 Hereditary and idiopathic neuropathy, unspecified: Secondary | ICD-10-CM | POA: Diagnosis not present

## 2017-10-06 DIAGNOSIS — K5730 Diverticulosis of large intestine without perforation or abscess without bleeding: Secondary | ICD-10-CM | POA: Diagnosis not present

## 2017-10-06 LAB — COMPREHENSIVE METABOLIC PANEL
ALBUMIN: 4.3 g/dL (ref 3.5–5.0)
ALT: 13 U/L (ref 0–55)
ANION GAP: 10 meq/L (ref 3–11)
AST: 14 U/L (ref 5–34)
Alkaline Phosphatase: 68 U/L (ref 40–150)
BILIRUBIN TOTAL: 0.44 mg/dL (ref 0.20–1.20)
BUN: 13.2 mg/dL (ref 7.0–26.0)
CO2: 25 meq/L (ref 22–29)
CREATININE: 0.9 mg/dL (ref 0.7–1.3)
Calcium: 9.5 mg/dL (ref 8.4–10.4)
Chloride: 103 mEq/L (ref 98–109)
EGFR: >60 mL/min/{1.73_m2} (ref >60)
Glucose: 107 mg/dl (ref 70–140)
Potassium: 4.2 mEq/L (ref 3.5–5.1)
Sodium: 139 mEq/L (ref 136–145)
TOTAL PROTEIN: 7.4 g/dL (ref 6.4–8.3)

## 2017-10-06 LAB — CBC WITH DIFFERENTIAL/PLATELET
BASO%: 0.6 % (ref 0.0–2.0)
Basophils Absolute: 0.1 10*3/uL (ref 0.0–0.1)
EOS%: 3.3 % (ref 0.0–7.0)
Eosinophils Absolute: 0.3 10*3/uL (ref 0.0–0.5)
HEMATOCRIT: 44.7 % (ref 38.4–49.9)
HEMOGLOBIN: 14.9 g/dL (ref 13.0–17.1)
LYMPH#: 4.8 10*3/uL — AB (ref 0.9–3.3)
LYMPH%: 54.3 % — AB (ref 14.0–49.0)
MCH: 31.5 pg (ref 27.2–33.4)
MCHC: 33.3 g/dL (ref 32.0–36.0)
MCV: 94.5 fL (ref 79.3–98.0)
MONO#: 0.7 10*3/uL (ref 0.1–0.9)
MONO%: 7.8 % (ref 0.0–14.0)
NEUT%: 34 % — ABNORMAL LOW (ref 39.0–75.0)
NEUTROS ABS: 3 10*3/uL (ref 1.5–6.5)
PLATELETS: 224 10*3/uL (ref 140–400)
RBC: 4.73 10*6/uL (ref 4.20–5.82)
RDW: 12.6 % (ref 11.0–14.6)
WBC: 8.9 10*3/uL (ref 4.0–10.3)

## 2017-10-06 MED ORDER — IOPAMIDOL (ISOVUE-300) INJECTION 61%
100.0000 mL | Freq: Once | INTRAVENOUS | Status: AC | PRN
  Administered 2017-10-06: 100 mL via INTRAVENOUS

## 2017-10-06 MED ORDER — IOPAMIDOL (ISOVUE-300) INJECTION 61%
INTRAVENOUS | Status: AC
  Filled 2017-10-06: qty 100

## 2017-10-07 ENCOUNTER — Other Ambulatory Visit: Payer: Self-pay | Admitting: Medical Oncology

## 2017-10-07 ENCOUNTER — Telehealth: Payer: Self-pay | Admitting: Medical Oncology

## 2017-10-07 NOTE — Telephone Encounter (Signed)
Py calling to ask for refill for Restoril -last filled sept. I left message on pt vm that Julien Nordmann is not available to approve refill. I recommended  to call PCP for refill . He said his PCP is only in 2 days a week . Note to [place].

## 2017-10-08 NOTE — Telephone Encounter (Signed)
Ok to refill it.

## 2017-10-10 ENCOUNTER — Other Ambulatory Visit: Payer: Self-pay | Admitting: Medical Oncology

## 2017-10-10 DIAGNOSIS — G4700 Insomnia, unspecified: Secondary | ICD-10-CM

## 2017-10-10 MED ORDER — TEMAZEPAM 30 MG PO CAPS
30.0000 mg | ORAL_CAPSULE | Freq: Every evening | ORAL | 0 refills | Status: DC | PRN
Start: 2017-10-10 — End: 2017-11-03

## 2017-10-10 NOTE — Telephone Encounter (Signed)
Pt.notified

## 2017-10-10 NOTE — Telephone Encounter (Signed)
Called in restoril

## 2017-10-18 HISTORY — PX: PILONIDAL CYST EXCISION: SHX744

## 2017-10-19 ENCOUNTER — Telehealth: Payer: Self-pay | Admitting: Medical Oncology

## 2017-10-19 NOTE — Telephone Encounter (Signed)
Returned call from on call message to call him , I left message to call me back.

## 2017-10-31 ENCOUNTER — Ambulatory Visit (HOSPITAL_COMMUNITY): Payer: BLUE CROSS/BLUE SHIELD

## 2017-10-31 ENCOUNTER — Other Ambulatory Visit: Payer: BLUE CROSS/BLUE SHIELD

## 2017-11-02 ENCOUNTER — Encounter: Payer: Self-pay | Admitting: Internal Medicine

## 2017-11-02 ENCOUNTER — Encounter: Payer: Self-pay | Admitting: *Deleted

## 2017-11-02 ENCOUNTER — Inpatient Hospital Stay: Payer: BLUE CROSS/BLUE SHIELD | Attending: Internal Medicine | Admitting: Internal Medicine

## 2017-11-02 ENCOUNTER — Telehealth: Payer: Self-pay | Admitting: Internal Medicine

## 2017-11-02 VITALS — BP 149/76 | HR 61 | Temp 98.2°F | Resp 18 | Ht 68.0 in | Wt 218.5 lb

## 2017-11-02 DIAGNOSIS — C3491 Malignant neoplasm of unspecified part of right bronchus or lung: Secondary | ICD-10-CM | POA: Insufficient documentation

## 2017-11-02 DIAGNOSIS — G620 Drug-induced polyneuropathy: Secondary | ICD-10-CM | POA: Diagnosis not present

## 2017-11-02 DIAGNOSIS — C3492 Malignant neoplasm of unspecified part of left bronchus or lung: Secondary | ICD-10-CM | POA: Insufficient documentation

## 2017-11-02 DIAGNOSIS — F329 Major depressive disorder, single episode, unspecified: Secondary | ICD-10-CM | POA: Insufficient documentation

## 2017-11-02 DIAGNOSIS — I10 Essential (primary) hypertension: Secondary | ICD-10-CM

## 2017-11-02 DIAGNOSIS — C7800 Secondary malignant neoplasm of unspecified lung: Secondary | ICD-10-CM

## 2017-11-02 DIAGNOSIS — Z7189 Other specified counseling: Secondary | ICD-10-CM

## 2017-11-02 DIAGNOSIS — Z5112 Encounter for antineoplastic immunotherapy: Secondary | ICD-10-CM | POA: Insufficient documentation

## 2017-11-02 NOTE — Progress Notes (Signed)
DISCONTINUE ON PATHWAY REGIMEN - Non-Small Cell Lung     A cycle is every 21 days:     Paclitaxel      Carboplatin   **Always confirm dose/schedule in your pharmacy ordering system**    REASON: Disease Progression PRIOR TREATMENT: LOS281: Carboplatin + Paclitaxel q21 Days x 4 Cycles TREATMENT RESPONSE: Stable Disease (SD)  START ON PATHWAY REGIMEN - Non-Small Cell Lung     A cycle is every 28 days:     Nivolumab   **Always confirm dose/schedule in your pharmacy ordering system**    Patient Characteristics: Stage IV Metastatic, Squamous, PS = 0, 1, Second Line, No Prior PD-1/PD-L1  Inhibitor and Immunotherapy Candidate AJCC T Category: T3 Current Disease Status: Distant Metastases AJCC N Category: N0 AJCC M Category: M1a AJCC 8 Stage Grouping: IVA Histology: Squamous Cell Line of therapy: Second Line PD-L1 Expression Status: Quantity Not Sufficient Performance Status: PS = 0, 1 Would you be surprised if this patient died  in the next year<= I would NOT be surprised if this patient died in the next year Immunotherapy Candidate Status: Candidate for Immunotherapy Prior Immunotherapy Status: No Prior PD-1/PD-L1 Inhibitor Intent of Therapy: Non-Curative / Palliative Intent, Discussed with Patient

## 2017-11-02 NOTE — Telephone Encounter (Signed)
Gave avs and calendar for January  °

## 2017-11-02 NOTE — Progress Notes (Signed)
[place] [phone]   Fax:([phone]) [phone]  OFFICE PROGRESS NOTE  [name], MD [place] [address]  DIAGNOSIS: Stage IV (T3, N0, M1 a) non-small cell lung cancer, squamous cell carcinoma presented with multiple bilateral pulmonary nodules diagnosed in April 2018.  PRIOR THERAPY:  Systemic chemotherapy with carboplatin for AUC of 5 and paclitaxel 175 MG/M2 every 3 weeks with Neulasta support. Status post 6 cycles.  CURRENT THERAPY: Second line treatment with immunotherapy was Nivolumab 480 mg IV every 4 weeks.  First cycle November 14, 2017.  INTERVAL HISTORY: [name] [name] 67 y.o. male clinic today for follow-up visit accompanied by his girlfriend.  The patient is feeling fine today with no specific complaints except for mild shortness of breath with exertion as well as the peripheral neuropathy.  He is a scheduled to see neurology on November 14, 2017.  He denied having any current chest pain, cough or hemoptysis.  He denied having any weight loss or night sweats.  He has no nausea, vomiting, diarrhea or constipation.  He has no fever or chills.  He has been on observation for the last few months.  He had repeat CT scan of the chest, abdomen and pelvis performed recently and he is here for evaluation and discussion of his discuss results.  MEDICAL HISTORY: Past Medical History:  Diagnosis Date  . Brain metastases ([place]) 02/15/2017  . Depression 01/29/2017  . Dyspnea   . Encounter for antineoplastic chemotherapy 01/29/2017  . Goals of care, counseling/discussion 01/29/2017  . Hypertension   . Metastatic squamous cell carcinoma to lung, unspecified laterality ([place]) 01/28/2017  . Stroke [facility])     ALLERGIES:  is allergic to no known allergies.  MEDICATIONS:  Current Outpatient Medications  Medication Sig Dispense Refill  . acetaminophen (TYLENOL) 325 MG tablet You can take 2 every 4 hours as needed.  You can buy it over the counter DO NOT TAKE  MORE THAN 4000 MG OF TYLENOL PER DAY.  IT CAN HARM YOUR LIVER.    Marland Kitchen gabapentin (NEURONTIN) 300 MG capsule Take 1 capsule (300 mg total) by mouth 4 (four) times daily. 90 capsule 1  . hydrochlorothiazide (HYDRODIURIL) 25 MG tablet Restart this after you see your Primary care doctor.  Check your blood pressure at home and record.    Marland Kitchen lisinopril (PRINIVIL,ZESTRIL) 40 MG tablet Take 40 mg by mouth daily.    . metoprolol succinate (TOPROL-XL) 50 MG 24 hr tablet 50 mg.  0  . mirtazapine (REMERON) 30 MG tablet Take 1 tablet (30 mg total) by mouth at bedtime. 30 tablet 1  . pantoprazole (PROTONIX) 40 MG tablet Take 1 tablet (40 mg total) by mouth 2 (two) times daily. 60 tablet 1  . simvastatin (ZOCOR) 5 MG tablet Take 1 tablet (5 mg total) by mouth daily. 5 tablet 3  . temazepam (RESTORIL) 30 MG capsule Take 1 capsule (30 mg total) by mouth at bedtime as needed for sleep. 30 capsule 0   No current facility-administered medications for this visit.     SURGICAL HISTORY:  Past Surgical History:  Procedure Laterality Date  . BACK SURGERY     cyst removal  . COLONOSCOPY    . LAPAROTOMY N/A 02/25/2017   Procedure: EXPLORATORY LAPAROTOMY MODIFIED GRAHAM'S PATCH;  Surgeon: Kinsinger, Arta Bruce, MD;  Location: [facility];  Service: General;  Laterality: N/A;  . MINOR PLACEMENT OF FIDUCIAL N/A 01/19/2017   Procedure: MINOR PLACEMENT OF FIDUCIAL  x 6;  Surgeon: Collene Gobble, MD;  Location: [place];  Service: Thoracic;  Laterality: N/A;  . VIDEO BRONCHOSCOPY WITH ENDOBRONCHIAL NAVIGATION N/A 01/19/2017   Procedure: VIDEO BRONCHOSCOPY WITH ENDOBRONCHIAL NAVIGATION;  Surgeon: Collene Gobble, MD;  Location: [place];  Service: Thoracic;  Laterality: N/A;    REVIEW OF SYSTEMS:  Constitutional: negative Eyes: negative Ears, nose, mouth, throat, and face: negative Respiratory: positive for dyspnea on exertion Cardiovascular: negative Gastrointestinal: negative Genitourinary:negative Integument/breast:  negative Hematologic/lymphatic: negative Musculoskeletal:negative Neurological: positive for paresthesia Behavioral/Psych: negative Endocrine: negative Allergic/Immunologic: negative   PHYSICAL EXAMINATION: General appearance: alert, cooperative and no distress Head: Normocephalic, without obvious abnormality, atraumatic Neck: no adenopathy, no JVD, supple, symmetrical, trachea midline and thyroid not enlarged, symmetric, no tenderness/mass/nodules Lymph nodes: Cervical, supraclavicular, and axillary nodes normal. Resp: clear to auscultation bilaterally Back: symmetric, no curvature. ROM normal. No CVA tenderness. Cardio: regular rate and rhythm, S1, S2 normal, no murmur, click, rub or gallop GI: soft, non-tender; bowel sounds normal; no masses,  no organomegaly Extremities: extremities normal, atraumatic, no cyanosis or edema Neurologic: Alert and oriented X 3, normal strength and tone. Normal symmetric reflexes. Normal coordination and gait  ECOG PERFORMANCE STATUS: 1 - Symptomatic but completely ambulatory  Blood pressure (!) 149/76, pulse 61, temperature 98.2 F (36.8 C), temperature source Oral, resp. rate 18, height 5\' 8"  (1.727 m), weight 218 lb 8 oz (99.1 kg), SpO2 95 %.  LABORATORY DATA: Lab Results  Component Value Date   WBC 8.9 10/06/2017   HGB 14.9 10/06/2017   HCT 44.7 10/06/2017   MCV 94.5 10/06/2017   PLT 224 10/06/2017      Chemistry      Component Value Date/Time   NA 139 10/06/2017 1052   K 4.2 10/06/2017 1052   CL 99 (L) 03/03/2017 0408   CO2 25 10/06/2017 1052   BUN 13.2 10/06/2017 1052   CREATININE 0.9 10/06/2017 1052      Component Value Date/Time   CALCIUM 9.5 10/06/2017 1052   ALKPHOS 68 10/06/2017 1052   AST 14 10/06/2017 1052   ALT 13 10/06/2017 1052   BILITOT 0.44 10/06/2017 1052       RADIOGRAPHIC STUDIES: Ct Chest W Contrast  Result Date: 10/07/2017 CLINICAL DATA:  Stage 4 lung ca, dx 01/2016 per pt Chemo 3 months ago , not on  chemo now Op denies any problems, pain , f/u per patient Isovue 300 163ml, right ac no infiltrates No hx of DM Hx of HTN^128mL ISOVUE-300 IOPAMIDOL (ISOVUE-300) INJECTION 61% EXAM: CT CHEST, ABDOMEN, AND PELVIS WITH CONTRAST TECHNIQUE: Multidetector CT imaging of the chest, abdomen and pelvis was performed following the standard protocol during bolus administration of intravenous contrast. CONTRAST:  147mL ISOVUE-300 IOPAMIDOL (ISOVUE-300) INJECTION 61% COMPARISON:  CT 07/29/2017, PET-CT 01/03/2017 FINDINGS: CT CHEST FINDINGS CT CHEST FINDINGS Cardiovascular: Coronary artery calcification and aortic atherosclerotic calcification. Mediastinum/Nodes: No axillary or supraclavicular adenopathy. No mediastinal or hilar adenopathy. No pericardial fluid. Lungs/Pleura: bilobed nodule in the upper RIGHT lobe. The inferior portion of this bilobed nodule measures 7 mm (image 38, series 7) which is increased from 4 mm on CT 07/29/2017. The superior portion of the nodule measuring 11 mm which is similar. Nodule within the lateral aspect of the LEFT upper lobe measures 10 x 7 mm decreased from 6 x 5 mm on most recent CT scan. Rounded multi nodule lesion in the medial lingula measuring 2.3 cm is stable. No clear new lesions are identified. Musculoskeletal: No aggressive osseous lesion. CT ABDOMEN  AND PELVIS FINDINGS Hepatobiliary: No focal hepatic lesion. No biliary ductal dilatation. Gallbladder is normal. Common bile duct is normal. Pancreas: Pancreas is normal. No ductal dilatation. No pancreatic inflammation. Spleen: Normal spleen Adrenals/urinary tract: Enlargement of the LEFT adrenal gland which was demonstrated has benign adenoma comparison PET-CT. Kidneys, ureters and bladder normal Stomach/Bowel: Ascending, transverse colon normal. Multiple diverticular the descending colon without acute inflammation. Vascular/Lymphatic: Abdominal aorta is normal caliber with atherosclerotic calcification. There is no retroperitoneal or  periportal lymphadenopathy. No pelvic lymphadenopathy. Reproductive: Prostate nor Other: No peritoneal metastasis Musculoskeletal: No aggressive osseous lesion. IMPRESSION: Chest Impression: 1. Interval enlargement of single LEFT upper lobe and single RIGHT upper lobe pulmonary nodule concerning for lung cancer recurrence. 2. No new adenopathy. Abdomen / Pelvis Impression: 1. No evidence of metastatic disease in the abdomen pelvis. 2. Extensive colonic diverticulosis. 3.  Atherosclerotic calcification of the aorta. Electronically Signed   By: Suzy Bouchard M.D.   On: 10/07/2017 10:16   Ct Abdomen Pelvis W Contrast  Result Date: 10/07/2017 CLINICAL DATA:  Stage 4 lung ca, dx 01/2016 per pt Chemo 3 months ago , not on chemo now Op denies any problems, pain , f/u per patient Isovue 300 171ml, right ac no infiltrates No hx of DM Hx of HTN^160mL ISOVUE-300 IOPAMIDOL (ISOVUE-300) INJECTION 61% EXAM: CT CHEST, ABDOMEN, AND PELVIS WITH CONTRAST TECHNIQUE: Multidetector CT imaging of the chest, abdomen and pelvis was performed following the standard protocol during bolus administration of intravenous contrast. CONTRAST:  16mL ISOVUE-300 IOPAMIDOL (ISOVUE-300) INJECTION 61% COMPARISON:  CT 07/29/2017, PET-CT 01/03/2017 FINDINGS: CT CHEST FINDINGS CT CHEST FINDINGS Cardiovascular: Coronary artery calcification and aortic atherosclerotic calcification. Mediastinum/Nodes: No axillary or supraclavicular adenopathy. No mediastinal or hilar adenopathy. No pericardial fluid. Lungs/Pleura: bilobed nodule in the upper RIGHT lobe. The inferior portion of this bilobed nodule measures 7 mm (image 38, series 7) which is increased from 4 mm on CT 07/29/2017. The superior portion of the nodule measuring 11 mm which is similar. Nodule within the lateral aspect of the LEFT upper lobe measures 10 x 7 mm decreased from 6 x 5 mm on most recent CT scan. Rounded multi nodule lesion in the medial lingula measuring 2.3 cm is stable. No clear  new lesions are identified. Musculoskeletal: No aggressive osseous lesion. CT ABDOMEN AND PELVIS FINDINGS Hepatobiliary: No focal hepatic lesion. No biliary ductal dilatation. Gallbladder is normal. Common bile duct is normal. Pancreas: Pancreas is normal. No ductal dilatation. No pancreatic inflammation. Spleen: Normal spleen Adrenals/urinary tract: Enlargement of the LEFT adrenal gland which was demonstrated has benign adenoma comparison PET-CT. Kidneys, ureters and bladder normal Stomach/Bowel: Ascending, transverse colon normal. Multiple diverticular the descending colon without acute inflammation. Vascular/Lymphatic: Abdominal aorta is normal caliber with atherosclerotic calcification. There is no retroperitoneal or periportal lymphadenopathy. No pelvic lymphadenopathy. Reproductive: Prostate nor Other: No peritoneal metastasis Musculoskeletal: No aggressive osseous lesion. IMPRESSION: Chest Impression: 1. Interval enlargement of single LEFT upper lobe and single RIGHT upper lobe pulmonary nodule concerning for lung cancer recurrence. 2. No new adenopathy. Abdomen / Pelvis Impression: 1. No evidence of metastatic disease in the abdomen pelvis. 2. Extensive colonic diverticulosis. 3.  Atherosclerotic calcification of the aorta. Electronically Signed   By: Suzy Bouchard M.D.   On: 10/07/2017 10:16    ASSESSMENT AND PLAN:  This is a very pleasant 67 years old white male with stage IV non-small cell lung cancer, squamous cell carcinoma presented with multiple bilateral pulmonary nodules. The patient was treated with systemic chemotherapy with carboplatin and  paclitaxel status post 6 cycles. He tolerated the last cycle of his treatment well except for the persistent peripheral neuropathy. The patient has been in observation for the last 3 months. He had a repeat CT scan of the chest, abdomen and pelvis performed recently.  Has a scan showed mild interval enlargement of left upper lobe as well as right  upper lobe pulmonary nodules concerning for disease progression. I personally and independently reviewed the scan images and discussed the results with the patient and his girlfriend. I gave the patient the option of close monitoring and observation versus proceeding with second line treatment with immunotherapy with Nivolumab 480 mg IV every 4 weeks. The patient is interested in treatment.  I discussed with him the adverse effect of the immunotherapy including but not limited to immunotherapy mediated skin rash, diarrhea, inflammation of the lung, kidney, liver, thyroid or other endocrine dysfunction. He is expected to start the first cycle of this treatment on November 14, 2017. For the peripheral neuropathy, I recommended for the patient to continue his current treatment with gabapentin.  He is also scheduled to see neurology on November 14, 2017. For depression, she will continue on Remeron 30 mg by mouth daily at bedtime. The patient will come back for follow-up visit with the start of cycle #2. He was advised to call immediately if he has any concerning symptoms in the interval. The patient voices understanding of current disease status and treatment options and is in agreement with the current care plan. All questions were answered. The patient knows to call the clinic with any problems, questions or concerns. We can certainly see the patient much sooner if necessary.  Disclaimer: This note was dictated with voice recognition software. Similar sounding words can inadvertently be transcribed and may not be corrected upon review.

## 2017-11-02 NOTE — Patient Instructions (Signed)
Steps to Quit Smoking Smoking tobacco can be bad for your health. It can also affect almost every organ in your body. Smoking puts you and people around you at risk for many serious long-lasting (chronic) diseases. Quitting smoking is hard, but it is one of the best things that you can do for your health. It is never too late to quit. What are the benefits of quitting smoking? When you quit smoking, you lower your risk for getting serious diseases and conditions. They can include:  Lung cancer or lung disease.  Heart disease.  Stroke.  Heart attack.  Not being able to have children (infertility).  Weak bones (osteoporosis) and broken bones (fractures).  If you have coughing, wheezing, and shortness of breath, those symptoms may get better when you quit. You may also get sick less often. If you are pregnant, quitting smoking can help to lower your chances of having a baby of low birth weight. What can I do to help me quit smoking? Talk with your doctor about what can help you quit smoking. Some things you can do (strategies) include:  Quitting smoking totally, instead of slowly cutting back how much you smoke over a period of time.  Going to in-person counseling. You are more likely to quit if you go to many counseling sessions.  Using resources and support systems, such as: ? Online chats with a counselor. ? Phone quitlines. ? Printed self-help materials. ? Support groups or group counseling. ? Text messaging programs. ? Mobile phone apps or applications.  Taking medicines. Some of these medicines may have nicotine in them. If you are pregnant or breastfeeding, do not take any medicines to quit smoking unless your doctor says it is okay. Talk with your doctor about counseling or other things that can help you.  Talk with your doctor about using more than one strategy at the same time, such as taking medicines while you are also going to in-person counseling. This can help make  quitting easier. What things can I do to make it easier to quit? Quitting smoking might feel very hard at first, but there is a lot that you can do to make it easier. Take these steps:  Talk to your family and friends. Ask them to support and encourage you.  Call phone quitlines, reach out to support groups, or work with a counselor.  Ask people who smoke to not smoke around you.  Avoid places that make you want (trigger) to smoke, such as: ? Bars. ? Parties. ? Smoke-break areas at work.  Spend time with people who do not smoke.  Lower the stress in your life. Stress can make you want to smoke. Try these things to help your stress: ? Getting regular exercise. ? Deep-breathing exercises. ? Yoga. ? Meditating. ? Doing a body scan. To do this, close your eyes, focus on one area of your body at a time from head to toe, and notice which parts of your body are tense. Try to relax the muscles in those areas.  Download or buy apps on your mobile phone or tablet that can help you stick to your quit plan. There are many free apps, such as QuitGuide from the CDC (Centers for Disease Control and Prevention). You can find more support from smokefree.gov and other websites.  This information is not intended to replace advice given to you by your health care provider. Make sure you discuss any questions you have with your health care provider. Document Released: 07/31/2009 Document   Revised: 06/01/2016 Document Reviewed: 02/18/2015 Elsevier Interactive Patient Education  2018 [employer]  

## 2017-11-02 NOTE — Progress Notes (Signed)
Oncology Nurse Navigator Documentation  Oncology Nurse Navigator Flowsheets 11/02/2017  Navigator Location [facility]  Navigator Encounter Type Clinic/MDC/I spoke with patient and wife today at clinic. He is doing well with complaints of bilateral feet numbness. I asked that he update Dr. Julien Nordmann about symptom.  He also asked about pulmonologist and about having PFT's.  I gave updated him to contact Dr. Matilde Bash office.  I gave him the address and phone number. He was thankful for the help.   Patient Visit Type MedOnc  Treatment Phase Treatment  Barriers/Navigation Needs Education  Education Other  Interventions Education  Acuity Level 2  Time Spent with Patient 15

## 2017-11-03 ENCOUNTER — Other Ambulatory Visit: Payer: Self-pay | Admitting: Medical Oncology

## 2017-11-03 DIAGNOSIS — G4700 Insomnia, unspecified: Secondary | ICD-10-CM

## 2017-11-03 MED ORDER — TEMAZEPAM 30 MG PO CAPS: 30.0000 mg | ORAL_CAPSULE | Freq: Every evening | ORAL | 0 refills | Status: DC | PRN

## 2017-11-08 ENCOUNTER — Telehealth: Payer: Self-pay | Admitting: Pulmonary Disease

## 2017-11-08 NOTE — Telephone Encounter (Signed)
ATC pt, no answer. Left message for pt to call back.  

## 2017-11-11 ENCOUNTER — Ambulatory Visit: Payer: BLUE CROSS/BLUE SHIELD | Admitting: Radiation Oncology

## 2017-11-11 NOTE — Telephone Encounter (Signed)
lmtcb x2 for pt. We need to verify what the pt means by "pulmonary results."

## 2017-11-14 ENCOUNTER — Inpatient Hospital Stay: Payer: BLUE CROSS/BLUE SHIELD

## 2017-11-14 ENCOUNTER — Encounter: Payer: Self-pay | Admitting: Diagnostic Neuroimaging

## 2017-11-14 ENCOUNTER — Ambulatory Visit (INDEPENDENT_AMBULATORY_CARE_PROVIDER_SITE_OTHER): Payer: BLUE CROSS/BLUE SHIELD | Admitting: Diagnostic Neuroimaging

## 2017-11-14 ENCOUNTER — Other Ambulatory Visit: Payer: Self-pay | Admitting: *Deleted

## 2017-11-14 VITALS — BP 146/88 | HR 60 | Ht 68.0 in | Wt 219.2 lb

## 2017-11-14 DIAGNOSIS — C7800 Secondary malignant neoplasm of unspecified lung: Secondary | ICD-10-CM

## 2017-11-14 DIAGNOSIS — I63131 Cerebral infarction due to embolism of right carotid artery: Secondary | ICD-10-CM | POA: Diagnosis not present

## 2017-11-14 DIAGNOSIS — Z5112 Encounter for antineoplastic immunotherapy: Secondary | ICD-10-CM | POA: Diagnosis not present

## 2017-11-14 DIAGNOSIS — G620 Drug-induced polyneuropathy: Secondary | ICD-10-CM

## 2017-11-14 DIAGNOSIS — T451X5A Adverse effect of antineoplastic and immunosuppressive drugs, initial encounter: Principal | ICD-10-CM

## 2017-11-14 LAB — CMP (CANCER CENTER ONLY)
ALT: 17 U/L (ref 0–55)
ANION GAP: 9 (ref 3–11)
AST: 19 U/L (ref 5–34)
Albumin: 4.4 g/dL (ref 3.5–5.0)
Alkaline Phosphatase: 66 U/L (ref 40–150)
BUN: 14 mg/dL (ref 7–26)
CHLORIDE: 103 mmol/L (ref 98–109)
CO2: 27 mmol/L (ref 22–29)
Calcium: 9.6 mg/dL (ref 8.4–10.4)
Creatinine: 0.93 mg/dL (ref 0.70–1.30)
Glucose, Bld: 88 mg/dL (ref 70–140)
POTASSIUM: 4.2 mmol/L (ref 3.5–5.1)
Sodium: 139 mmol/L (ref 136–145)
Total Bilirubin: 0.6 mg/dL (ref 0.2–1.2)
Total Protein: 7.6 g/dL (ref 6.4–8.3)

## 2017-11-14 LAB — CBC WITH DIFFERENTIAL (CANCER CENTER ONLY)
Basophils Absolute: 0 10*3/uL (ref 0.0–0.1)
Basophils Relative: 0 %
EOS ABS: 0.2 10*3/uL (ref 0.0–0.5)
EOS PCT: 2 %
HCT: 45.9 % (ref 38.4–49.9)
Hemoglobin: 15.2 g/dL (ref 13.0–17.1)
LYMPHS ABS: 4.9 10*3/uL — AB (ref 0.9–3.3)
LYMPHS PCT: 51 %
MCH: 30.8 pg (ref 27.2–33.4)
MCHC: 33.1 g/dL (ref 32.0–36.0)
MCV: 92.9 fL (ref 79.3–98.0)
MONOS PCT: 7 %
Monocytes Absolute: 0.7 10*3/uL (ref 0.1–0.9)
Neutro Abs: 3.8 10*3/uL (ref 1.5–6.5)
Neutrophils Relative %: 40 %
PLATELETS: 223 10*3/uL (ref 140–400)
RBC: 4.94 MIL/uL (ref 4.20–5.82)
RDW: 13.1 % (ref 11.0–15.6)
WBC Count: 9.6 10*3/uL (ref 4.0–10.3)

## 2017-11-14 LAB — RESEARCH LABS

## 2017-11-14 MED ORDER — SODIUM CHLORIDE 0.9 % IV SOLN
Freq: Once | INTRAVENOUS | Status: AC
  Administered 2017-11-14: 17:00:00 via INTRAVENOUS

## 2017-11-14 MED ORDER — SODIUM CHLORIDE 0.9 % IV SOLN
480.0000 mg | Freq: Once | INTRAVENOUS | Status: AC
  Administered 2017-11-14: 480 mg via INTRAVENOUS
  Filled 2017-11-14: qty 48

## 2017-11-14 NOTE — Patient Instructions (Signed)
CHEMOTHERAPY NEUROPATHY - continue gabapentin 300mg  4 times per day; may increase to 600 to 900mg  three times a day

## 2017-11-14 NOTE — Telephone Encounter (Signed)
lmtcb x3 for pt. 

## 2017-11-14 NOTE — Progress Notes (Addendum)
[facility]  PATIENT: [name] [name] DOB: 1951-09-16  REFERRING CLINICIAN: Mayme Genta, MD HISTORY FROM: patient REASON FOR VISIT: follow up    HISTORICAL  CHIEF COMPLAINT:  Chief Complaint  Patient presents with  . Follow-up  . Cerebrovascular Accident HX  . Pain in bilateral    Has had since you have had stroke.  Numbness/ burning sensation in feet, and numbenss in fingers.  Tia?  in R eye.  Marland Kitchen     HISTORY OF PRESENT ILLNESS:   UPDATE (11/14/17, VRP): Since last visit, having more neuropathy pain in feet and hands. Sxs started after his chemotherapy tx (carboplatin + taxol). Tolerating gabapentin 300mg  4 times per day, but not much help. Topical neuropathy cream not helping. No alleviating or aggravating factors.   Also went to vascular surgery, had repeat carotid u/s, and found to have occluded right ICA.   UPDATE 06/08/17: Since last visit, had transient right eye blindness (2 hours), about 3 weeks ago. Dr. Woody Seller ordered carotid u/s (showed right ICA occlusion, and left ICA stenosis). Unfortunately, patient never had CTA head / neck that I had ordered at last visit for some reason. It is possible that he missed that scan because he was in the hospital in May 11-17, 2018 (just after I saw him) for GI perforation. Patient not sure what happened to CTA imaging order or appt. In any case, patient now has vascular surgery appt on 07/18/17 with Dr. Arita Miss.  PRIOR HPI (02/23/17): 67 year old male here for evaluation of abnormal MRI brain and possible embolic strokes. December 2017 patient was having intermittent left hand numbness. He noticed some mild heaviness with left hand. This has been intermittent but improved since that time. March 2018 patient was having chest pain, cough, had abnormal chest x-ray, and ultimately was found to have stage IV (T3 N0 M1 a) non-small cell lung cancer, squamous cell carcinoma with bilateral pulmonary nodules. Patient had MRI of the brain for  lungs cancer staging was found to have abnormal lesions in the right frontal lobe, possibly brain metastases. These were reviewed in tumor board and possibility of embolic strokes was raised. Therefore patient referred to me for further evaluation. Patient denies any left face or left leg problems. He does report some mild left hand weakness which date back to December 2017. He has had some intermittent confusion, getting lost while driving, short temper, increased anxiety, poor sleep patterns. Some of these precede cancer diagnosis and some of them have worsened since cancer diagnosis. Apparently patient had carotid ultrasound with PCP a few months ago which showed "vein blockage" in some location is neck.    REVIEW OF SYSTEMS: Full 14 system review of systems performed and negative with exception of: Fatigue.  ALLERGIES: Allergies  Allergen Reactions  . No Known Allergies     HOME MEDICATIONS: Outpatient Medications Prior to Visit  Medication Sig Dispense Refill  . acetaminophen (TYLENOL) 325 MG tablet You can take 2 every 4 hours as needed.  You can buy it over the counter DO NOT TAKE MORE THAN 4000 MG OF TYLENOL PER DAY.  IT CAN HARM YOUR LIVER.    Marland Kitchen aspirin EC 81 MG tablet Take 81 mg by mouth daily.    Marland Kitchen gabapentin (NEURONTIN) 300 MG capsule Take 1 capsule (300 mg total) by mouth 4 (four) times daily. 90 capsule 1  . hydrochlorothiazide (HYDRODIURIL) 25 MG tablet Restart this after you see your Primary care doctor.  Check your blood pressure at home  and record.    Marland Kitchen lisinopril (PRINIVIL,ZESTRIL) 40 MG tablet Take 40 mg by mouth daily.    . metoprolol succinate (TOPROL-XL) 50 MG 24 hr tablet 50 mg.  0  . mirtazapine (REMERON) 30 MG tablet Take 1 tablet (30 mg total) by mouth at bedtime. 30 tablet 1  . pantoprazole (PROTONIX) 40 MG tablet Take 1 tablet (40 mg total) by mouth 2 (two) times daily. 60 tablet 1  . simvastatin (ZOCOR) 5 MG tablet Take 1 tablet (5 mg total) by mouth daily. 5  tablet 3  . temazepam (RESTORIL) 30 MG capsule Take 1 capsule (30 mg total) by mouth at bedtime as needed for sleep. 30 capsule 0   No facility-administered medications prior to visit.     PAST MEDICAL HISTORY: Past Medical History:  Diagnosis Date  . Brain metastases ([place]) 02/15/2017  . Depression 01/29/2017  . Dyspnea   . Encounter for antineoplastic chemotherapy 01/29/2017  . Goals of care, counseling/discussion 01/29/2017  . Hypertension   . Metastatic squamous cell carcinoma to lung, unspecified laterality ([place]) 01/28/2017  . Stroke [facility])     PAST SURGICAL HISTORY: Past Surgical History:  Procedure Laterality Date  . BACK SURGERY     cyst removal  . COLONOSCOPY    . LAPAROTOMY N/A 02/25/2017   Procedure: EXPLORATORY LAPAROTOMY MODIFIED GRAHAM'S PATCH;  Surgeon: Kinsinger, Arta Bruce, MD;  Location: [facility];  Service: General;  Laterality: N/A;  . MINOR PLACEMENT OF FIDUCIAL N/A 01/19/2017   Procedure: MINOR PLACEMENT OF FIDUCIAL x 6;  Surgeon: Collene Gobble, MD;  Location: [facility];  Service: Thoracic;  Laterality: N/A;  . VIDEO BRONCHOSCOPY WITH ENDOBRONCHIAL NAVIGATION N/A 01/19/2017   Procedure: VIDEO BRONCHOSCOPY WITH ENDOBRONCHIAL NAVIGATION;  Surgeon: Collene Gobble, MD;  Location: [facility];  Service: Thoracic;  Laterality: N/A;    FAMILY HISTORY: Family History  Problem Relation Age of Onset  . Cancer Father        unsure of what type    SOCIAL HISTORY:  Social History   Socioeconomic History  . Marital status: Single    Spouse name: Not on file  . Number of children: 0  . Years of education: 10  . Highest education level: Not on file  Social Needs  . Financial resource strain: Not on file  . Food insecurity - worry: Not on file  . Food insecurity - inability: Not on file  . Transportation needs - medical: Not on file  . Transportation needs - non-medical: Not on file  Occupational History    Comment: NA  Tobacco Use  . Smoking status: Current Every Day Smoker     Packs/day: 0.50    Years: 45.00    Pack years: 22.50    Types: Cigarettes  . Smokeless tobacco: Never Used  Substance and Sexual Activity  . Alcohol use: No  . Drug use: Yes    Types: Marijuana    Comment: daily  . Sexual activity: Not on file  Other Topics Concern  . Not on file  Social History Narrative   Lives alone   Caffeine- coffee, 5 cups daily     PHYSICAL EXAM  GENERAL EXAM/CONSTITUTIONAL: Vitals:  Vitals:   11/14/17 1326  BP: (!) 146/88  Pulse: 60  Weight: 219 lb 3.2 oz (99.4 kg)  Height: 5\' 8"  (1.727 m)   Body mass index is 33.33 kg/m. No exam data present  Patient is in no distress; well developed, nourished and groomed; neck is supple  CARDIOVASCULAR:  Examination of carotid arteries is normal; no carotid bruits  Regular rate and rhythm, no murmurs  Examination of peripheral vascular system by observation and palpation is normal  EYES:  Ophthalmoscopic exam of optic discs and posterior segments is normal; no papilledema or hemorrhages  MUSCULOSKELETAL:  Gait, strength, tone, movements noted in Neurologic exam below  NEUROLOGIC: MENTAL STATUS:  No flowsheet data found.  awake, alert, oriented to person, place and time  recent and remote memory intact  normal attention and concentration  language fluent, comprehension intact, naming intact,   fund of knowledge appropriate  CRANIAL NERVE:   2nd - no papilledema on fundoscopic exam  2nd, 3rd, 4th, 6th - pupils equal and reactive to light, visual fields full to confrontation, extraocular muscles intact, no nystagmus  5th - facial sensation symmetric  7th - facial strength symmetric  8th - hearing intact  9th - palate elevates symmetrically, uvula midline  11th - shoulder shrug symmetric  12th - tongue protrusion midline  MOTOR:   normal bulk and tone, full strength in the BUE, BLE  RIGHT ARM SLIGHTLY ORBITS AROUND LEFT ARM  SENSORY:   normal and symmetric to light  touch, pinprick, temperature, vibration  EXCEPT SLIGHTLY DECR TEMP IN LEFT HAND  COORDINATION:   finger-nose-finger, fine finger movements normal  REFLEXES:   deep tendon reflexes present and symmetric  GAIT/STATION:   narrow based gait; able to walk tandem; romberg is negative    DIAGNOSTIC DATA (LABS, IMAGING, TESTING) - I reviewed patient records, labs, notes, testing and imaging myself where available.  Lab Results  Component Value Date   WBC 8.9 10/06/2017   HGB 14.9 10/06/2017   HCT 44.7 10/06/2017   MCV 94.5 10/06/2017   PLT 224 10/06/2017      Component Value Date/Time   NA 139 10/06/2017 1052   K 4.2 10/06/2017 1052   CL 99 (L) 03/03/2017 0408   CO2 25 10/06/2017 1052   GLUCOSE 107 10/06/2017 1052   BUN 13.2 10/06/2017 1052   CREATININE 0.9 10/06/2017 1052   CALCIUM 9.5 10/06/2017 1052   PROT 7.4 10/06/2017 1052   ALBUMIN 4.3 10/06/2017 1052   AST 14 10/06/2017 1052   ALT 13 10/06/2017 1052   ALKPHOS 68 10/06/2017 1052   BILITOT 0.44 10/06/2017 1052   GFRNONAA >60 03/03/2017 0408   GFRAA >60 03/03/2017 0408   Lab Results  Component Value Date   CHOL 155 02/23/2017   HDL 55 02/23/2017   LDLCALC 82 02/23/2017   TRIG 90 02/23/2017   CHOLHDL 2.8 02/23/2017   Lab Results  Component Value Date   HGBA1C 6.4 (H) 03/01/2017   No results found for: VITAMINB12 No results found for: TSH   02/08/17 MRI brain [I reviewed images myself and agree with interpretation. In addition the right ICA flow void appears irregular and suggests proximal stenosis or occlusion. -VRP]  1. Multiple contrast-enhancing lesions clustered within the right frontal lobe. In the context of metastatic lung cancer, metastases are a primary concern. However, this is not the typical appearance of brain parenchymal metastases, which usually have a more rounded morphology and greater associated vasogenic edema. Additionally, there are prominent cortical vessels associated with these  lesions and the lesion margins are somewhat indistinct. Vasculitis can have this appearance, as can lymphoma, though the latter is less likely. CTA of the head might be helpful to further assess the vessels closely associated with these lesions and to provide more global assessment of the cerebral arteries. 2. No  acute ischemia or intracranial hemorrhage. 3. Chronic microvascular ischemia.  04/11/17 MRI brain [I reviewed images myself and agree with interpretation. -VRP]  1. Evolution of signal abnormality and enhancement in the anterior right frontal lobe since April is most compatible with subacute and chronic ischemia. And there is evidence of poor flow versus occlusion of the cervical right ICA. 2.  No metastatic disease identified. 3. Underlying advanced but nonspecific cerebral white matter signal changes. Accelerated chronic small vessel disease and/or vasculitis are top differential considerations.    ASSESSMENT AND PLAN  67 y.o. year old male here with stage IV (T3 N0 M1 a) non-small cell lung cancer, squamous cell carcinoma with bilateral pulmonary nodules.   Now found to have abnormal right frontal lobe lesions, enhancing, raising possibility of cerebral metastases versus cerebral infarctions. I reviewed imaging myself and there are features for both possibilities. If these are ischemic infarctions, they tend to fall in the border zone watershed region between the right anterior cerebral artery and right middle cerebral artery. In combination with the right ICA flow void abnormality, this would fit with a watershed ischemic infarction pattern. Will proceed with further workup.   Ddx: right frontal ischemic infarctions (watershed vs embolic infarcts due to right ICA stenosis or occlusion) vs cerebral metastases (less likely)  1. Chemotherapy-induced neuropathy ([place])   2. Cerebrovascular accident (CVA) due to embolism of right carotid artery (HCC)      PLAN:  I spent 15  minutes of face to face time with patient. Greater than 50% of time was spent in counseling and coordination of care with patient. In summary we discussed:   CHEMOTHERAPY NEUROPATHY (new problem, no additional workup) - continue gabapentin 300mg  4 times per day; may increase to 600-900mg  three times a day  - may consider amitriptyline, cymbalta or pain mgmt clinic in future  STROKE PREVENTION / RIGHT ICA OCCLUSION (symptomatic -> EMBOLIC STROKE (May 8592) + RIGHT EYE AMAUROSIS FUGAX (Aug 2018)) - continue lisinopril, HCTZ - continue aspirin 81mg  daily (ok from GI surgeon standpoint; had prior GI perforation (duodenum; pyloric channel ulceration), but has been cleared for aspirin 81mg  daily)  LUNG CANCER  - per Dr. Julien Nordmann  Return if symptoms worsen or fail to improve, for return to PCP and oncology.    Penni Bombard, MD 07/12/4627, 6:38 PM Certified in Neurology, Neurophysiology and Neuroimaging  [facility] [address] [place], [place] [address] [phone]

## 2017-11-14 NOTE — Patient Instructions (Addendum)
[facility] Discharge Instructions for Patients Receiving Chemotherapy  Today you received the following chemotherapy agents:  Nivolumab  To help prevent nausea and vomiting after your treatment, we encourage you to take your nausea medication as directed.   If you develop nausea and vomiting that is not controlled by your nausea medication, call the clinic.   BELOW ARE SYMPTOMS THAT SHOULD BE REPORTED IMMEDIATELY:  *FEVER GREATER THAN 100.5 F  *CHILLS WITH OR WITHOUT FEVER  NAUSEA AND VOMITING THAT IS NOT CONTROLLED WITH YOUR NAUSEA MEDICATION  *UNUSUAL SHORTNESS OF BREATH  *UNUSUAL BRUISING OR BLEEDING  TENDERNESS IN MOUTH AND THROAT WITH OR WITHOUT PRESENCE OF ULCERS  *URINARY PROBLEMS  *BOWEL PROBLEMS  UNUSUAL RASH Items with * indicate a potential emergency and should be followed up as soon as possible.  Feel free to call the clinic you have any questions or concerns. The clinic phone number is [phone].  Please show the [place] at check-in to the Emergency Department and triage nurse.  Nivolumab injection What is this medicine? NIVOLUMAB (nye VOL ue mab) is a monoclonal antibody. It is used to treat melanoma, lung cancer, kidney cancer, head and neck cancer, Hodgkin lymphoma, urothelial cancer, colon cancer, and liver cancer. This medicine may be used for other purposes; ask your health care provider or pharmacist if you have questions. COMMON BRAND NAME(S): Opdivo What should I tell my health care provider before I take this medicine? They need to know if you have any of these conditions: -diabetes -immune system problems -kidney disease -liver disease -lung disease -organ transplant -stomach or intestine problems -thyroid disease -an unusual or allergic reaction to nivolumab, other medicines, foods, dyes, or preservatives -pregnant or trying to get pregnant -breast-feeding How should I use this medicine? This medicine is for  infusion into a vein. It is given by a health care professional in a hospital or clinic setting. A special MedGuide will be given to you before each treatment. Be sure to read this information carefully each time. Talk to your pediatrician regarding the use of this medicine in children. While this drug may be prescribed for children as young as 12 years for selected conditions, precautions do apply. Overdosage: If you think you have taken too much of this medicine contact a poison control center or emergency room at once. NOTE: This medicine is only for you. Do not share this medicine with others. What if I miss a dose? It is important not to miss your dose. Call your doctor or health care professional if you are unable to keep an appointment. What may interact with this medicine? Interactions have not been studied. Give your health care provider a list of all the medicines, herbs, non-prescription drugs, or dietary supplements you use. Also tell them if you smoke, drink alcohol, or use illegal drugs. Some items may interact with your medicine. This list may not describe all possible interactions. Give your health care provider a list of all the medicines, herbs, non-prescription drugs, or dietary supplements you use. Also tell them if you smoke, drink alcohol, or use illegal drugs. Some items may interact with your medicine. What should I watch for while using this medicine? This drug may make you feel generally unwell. Continue your course of treatment even though you feel ill unless your doctor tells you to stop. You may need blood work done while you are taking this medicine. Do not become pregnant while taking this medicine or for 5 months after stopping  it. Women should inform their doctor if they wish to become pregnant or think they might be pregnant. There is a potential for serious side effects to an unborn child. Talk to your health care professional or pharmacist for more information. Do  not breast-feed an infant while taking this medicine. What side effects may I notice from receiving this medicine? Side effects that you should report to your doctor or health care professional as soon as possible: -allergic reactions like skin rash, itching or hives, swelling of the face, lips, or tongue -black, tarry stools -blood in the urine -bloody or watery diarrhea -changes in vision -change in sex drive -changes in emotions or moods -chest pain -confusion -cough -decreased appetite -diarrhea -facial flushing -feeling faint or lightheaded -fever, chills -hair loss -hallucination, loss of contact with reality -headache -irritable -joint pain -loss of memory -muscle pain -muscle weakness -seizures -shortness of breath -signs and symptoms of high blood sugar such as dizziness; dry mouth; dry skin; fruity breath; nausea; stomach pain; increased hunger or thirst; increased urination -signs and symptoms of kidney injury like trouble passing urine or change in the amount of urine -signs and symptoms of liver injury like dark yellow or brown urine; general ill feeling or flu-like symptoms; light-colored stools; loss of appetite; nausea; right upper belly pain; unusually weak or tired; yellowing of the eyes or skin -stiff neck -swelling of the ankles, feet, hands -weight gain Side effects that usually do not require medical attention (report to your doctor or health care professional if they continue or are bothersome): -bone pain -constipation -tiredness -vomiting This list may not describe all possible side effects. Call your doctor for medical advice about side effects. You may report side effects to FDA at 1-800-FDA-1088. Where should I keep my medicine? This drug is given in a hospital or clinic and will not be stored at home. NOTE: This sheet is a summary. It may not cover all possible information. If you have questions about this medicine, talk to your doctor,  pharmacist, or health care provider.  2018 Elsevier/Gold Standard (2016-07-12 17:49:34)

## 2017-11-15 NOTE — Telephone Encounter (Signed)
We have attempted to contact the pt several times with no success or call back from the pt. Per triage protocol, message will be closed.  

## 2017-11-16 ENCOUNTER — Telehealth: Payer: Self-pay | Admitting: *Deleted

## 2017-11-16 NOTE — Telephone Encounter (Signed)
TCT patient for follow up s/p 1st Opdivo. Spoke with pt. He states he feels fine. No c/o.  Voiced appreciation of follow up call. He is aware of upcoming appts.

## 2017-11-16 NOTE — Telephone Encounter (Signed)
-----   Message from Flo Shanks, RN sent at 11/14/2017  5:32 PM EST ----- Regarding: Dr. Julien Nordmann First time Opdivo First time Opdivo.

## 2017-11-17 ENCOUNTER — Telehealth: Payer: Self-pay | Admitting: Pulmonary Disease

## 2017-11-17 NOTE — Telephone Encounter (Signed)
Called and spoke with pt and he is aware of results from 03/16/17 that have been mailed to him again since he never received the others that were mailed.  Nothing further is needed.

## 2017-11-25 ENCOUNTER — Telehealth: Payer: Self-pay | Admitting: *Deleted

## 2017-11-25 NOTE — Telephone Encounter (Signed)
Received call from pt asking if OK to have "relations" while on treatment.  Reviewed chart & pt received opdivo & last treatment 11/14/17.  Bloods counts OK.  Informed that this should be OK & should wear condom if within 72 hours of treatment & especially if intercourse with male that could become pregnant.  He states that partner was not of reproductive age & was thankful for info.

## 2017-11-29 ENCOUNTER — Other Ambulatory Visit: Payer: Self-pay | Admitting: Medical Oncology

## 2017-11-29 ENCOUNTER — Telehealth: Payer: Self-pay | Admitting: Medical Oncology

## 2017-11-29 DIAGNOSIS — G4700 Insomnia, unspecified: Secondary | ICD-10-CM

## 2017-11-29 DIAGNOSIS — C7931 Secondary malignant neoplasm of brain: Secondary | ICD-10-CM

## 2017-11-29 DIAGNOSIS — C7800 Secondary malignant neoplasm of unspecified lung: Secondary | ICD-10-CM

## 2017-11-29 DIAGNOSIS — Z5111 Encounter for antineoplastic chemotherapy: Secondary | ICD-10-CM

## 2017-11-29 MED ORDER — GABAPENTIN 300 MG PO CAPS: 300.0000 mg | ORAL_CAPSULE | Freq: Four times a day (QID) | ORAL | 1 refills | Status: DC

## 2017-11-29 NOTE — Telephone Encounter (Signed)
Refill due 2/15.

## 2017-12-12 ENCOUNTER — Encounter: Payer: Self-pay | Admitting: *Deleted

## 2017-12-12 ENCOUNTER — Inpatient Hospital Stay: Payer: BLUE CROSS/BLUE SHIELD

## 2017-12-12 ENCOUNTER — Telehealth: Payer: Self-pay | Admitting: Internal Medicine

## 2017-12-12 ENCOUNTER — Encounter: Payer: Self-pay | Admitting: Oncology

## 2017-12-12 ENCOUNTER — Inpatient Hospital Stay: Payer: BLUE CROSS/BLUE SHIELD | Attending: Internal Medicine | Admitting: Oncology

## 2017-12-12 ENCOUNTER — Encounter: Payer: Self-pay | Admitting: Internal Medicine

## 2017-12-12 VITALS — BP 126/72 | HR 68 | Temp 97.7°F | Resp 20 | Ht 68.0 in | Wt 220.4 lb

## 2017-12-12 DIAGNOSIS — C7800 Secondary malignant neoplasm of unspecified lung: Secondary | ICD-10-CM

## 2017-12-12 DIAGNOSIS — Z5112 Encounter for antineoplastic immunotherapy: Secondary | ICD-10-CM | POA: Insufficient documentation

## 2017-12-12 DIAGNOSIS — C3491 Malignant neoplasm of unspecified part of right bronchus or lung: Secondary | ICD-10-CM | POA: Diagnosis not present

## 2017-12-12 DIAGNOSIS — C3492 Malignant neoplasm of unspecified part of left bronchus or lung: Secondary | ICD-10-CM | POA: Diagnosis present

## 2017-12-12 DIAGNOSIS — G609 Hereditary and idiopathic neuropathy, unspecified: Secondary | ICD-10-CM

## 2017-12-12 DIAGNOSIS — G4700 Insomnia, unspecified: Secondary | ICD-10-CM | POA: Diagnosis not present

## 2017-12-12 DIAGNOSIS — G629 Polyneuropathy, unspecified: Secondary | ICD-10-CM | POA: Insufficient documentation

## 2017-12-12 DIAGNOSIS — C7931 Secondary malignant neoplasm of brain: Secondary | ICD-10-CM | POA: Insufficient documentation

## 2017-12-12 DIAGNOSIS — F329 Major depressive disorder, single episode, unspecified: Secondary | ICD-10-CM | POA: Insufficient documentation

## 2017-12-12 LAB — CMP (CANCER CENTER ONLY)
ALK PHOS: 60 U/L (ref 40–150)
ALT: 17 U/L (ref 0–55)
ANION GAP: 9 (ref 3–11)
AST: 14 U/L (ref 5–34)
Albumin: 3.8 g/dL (ref 3.5–5.0)
BILIRUBIN TOTAL: 0.4 mg/dL (ref 0.2–1.2)
BUN: 11 mg/dL (ref 7–26)
CALCIUM: 9.6 mg/dL (ref 8.4–10.4)
CO2: 26 mmol/L (ref 22–29)
CREATININE: 0.85 mg/dL (ref 0.70–1.30)
Chloride: 104 mmol/L (ref 98–109)
Glucose, Bld: 83 mg/dL (ref 70–140)
Potassium: 4.3 mmol/L (ref 3.5–5.1)
SODIUM: 139 mmol/L (ref 136–145)
TOTAL PROTEIN: 7.3 g/dL (ref 6.4–8.3)

## 2017-12-12 LAB — CBC WITH DIFFERENTIAL (CANCER CENTER ONLY)
Basophils Absolute: 0.1 10*3/uL (ref 0.0–0.1)
Basophils Relative: 1 %
EOS ABS: 0.2 10*3/uL (ref 0.0–0.5)
Eosinophils Relative: 2 %
HEMATOCRIT: 42.9 % (ref 38.4–49.9)
HEMOGLOBIN: 14.2 g/dL (ref 13.0–17.1)
LYMPHS ABS: 4.3 10*3/uL — AB (ref 0.9–3.3)
LYMPHS PCT: 44 %
MCH: 29.6 pg (ref 27.2–33.4)
MCHC: 33.1 g/dL (ref 32.0–36.0)
MCV: 89.4 fL (ref 79.3–98.0)
Monocytes Absolute: 1.1 10*3/uL — ABNORMAL HIGH (ref 0.1–0.9)
Monocytes Relative: 11 %
NEUTROS ABS: 4.1 10*3/uL (ref 1.5–6.5)
NEUTROS PCT: 42 %
Platelet Count: 227 10*3/uL (ref 140–400)
RBC: 4.8 MIL/uL (ref 4.20–5.82)
RDW: 13.9 % (ref 11.0–14.6)
WBC: 9.9 10*3/uL (ref 4.0–10.3)

## 2017-12-12 LAB — TSH: TSH: 1.747 u[IU]/mL (ref 0.320–4.118)

## 2017-12-12 MED ORDER — SODIUM CHLORIDE 0.9 % IV SOLN
Freq: Once | INTRAVENOUS | Status: AC
  Administered 2017-12-12: 15:00:00 via INTRAVENOUS

## 2017-12-12 MED ORDER — TEMAZEPAM 30 MG PO CAPS: 30.0000 mg | ORAL_CAPSULE | Freq: Every evening | ORAL | 0 refills | Status: DC | PRN

## 2017-12-12 MED ORDER — SODIUM CHLORIDE 0.9 % IV SOLN
480.0000 mg | Freq: Once | INTRAVENOUS | Status: AC
  Administered 2017-12-12: 480 mg via INTRAVENOUS
  Filled 2017-12-12: qty 48

## 2017-12-12 NOTE — Progress Notes (Signed)
[name] [name]  Patient came to [employer] representative office requesting assistance with gas and transportation.  Patient has already used gas cards provided by the [employer], and Lung cancer initiative for this year.  CSW and patient discussed the [place].  CSW shared patients information with financial advocate and provided patients income information.  Financial advocate provided patient with a gas card and is scheduled to meet with him at his next appointment to review grant information.  CSW provided contact information and encouraged patient to call with questions or concerns.    Johnnye Lana, MSW, LCSW, OSW-C Clinical Social Worker [facility] [phone]

## 2017-12-12 NOTE — Progress Notes (Signed)
[facility] OFFICE PROGRESS NOTE  [name], MD [place] [address]  DIAGNOSIS: Stage IV (T3, N0, M1 a) non-small cell lung cancer, squamous cell carcinoma presented with multiple bilateral pulmonary nodules diagnosed in April 2018.  PRIOR THERAPY: Systemic chemotherapy with carboplatin for AUC of 5 and paclitaxel 175 MG/M2 every 3 weeks with Neulasta support. Status post 6 cycles.  CURRENT THERAPY: Second line treatment with immunotherapy was Nivolumab 480 mg IV every 4 weeks.  First cycle November 14, 2017.  Status post 1 cycle.  INTERVAL HISTORY: [name] [name] 67 y.o. male returns for routine follow-up visit by himself.  The patient has no specific complaints except for ongoing peripheral neuropathy.  The patient reports that he has been taking gabapentin up to 3600 mg daily.  He was told that it was okay to do this by his primary care provider.  This is more than the prescribed amount that we have given him of 300 mg 4 times a day.  He reports that the gabapentin is not really helping very much.  He is also using Voltaren gel which does not seem to be helping either.  He denies fevers and chills.  Denies chest pain, shortness of breath, cough, hemoptysis.  Denies nausea, vomiting, constipation, diarrhea.  Patient is here for evaluation prior to cycle #2 of his immunotherapy.  MEDICAL HISTORY: Past Medical History:  Diagnosis Date  . Brain metastases ([place]) 02/15/2017  . Depression 01/29/2017  . Dyspnea   . Encounter for antineoplastic chemotherapy 01/29/2017  . Goals of care, counseling/discussion 01/29/2017  . Hypertension   . Metastatic squamous cell carcinoma to lung, unspecified laterality ([place]) 01/28/2017  . Stroke [facility])     ALLERGIES:  is allergic to no known allergies.  MEDICATIONS:  Current Outpatient Medications  Medication Sig Dispense Refill  . acetaminophen (TYLENOL) 325 MG tablet You can take 2 every 4 hours as needed.  You can buy it over the  counter DO NOT TAKE MORE THAN 4000 MG OF TYLENOL PER DAY.  IT CAN HARM YOUR LIVER.    Marland Kitchen aspirin EC 81 MG tablet Take 81 mg by mouth daily.    Marland Kitchen gabapentin (NEURONTIN) 300 MG capsule Take 1 capsule (300 mg total) by mouth 4 (four) times daily. (Patient taking differently: Take 300 mg by mouth 4 (four) times daily. Vickki Muff, MD told patient can take up to 3600 mg/day  taking 2 capsules four times daily) 90 capsule 1  . hydrochlorothiazide (HYDRODIURIL) 25 MG tablet Restart this after you see your Primary care doctor.  Check your blood pressure at home and record.    Marland Kitchen lisinopril (PRINIVIL,ZESTRIL) 40 MG tablet Take 40 mg by mouth daily.    . metoprolol succinate (TOPROL-XL) 50 MG 24 hr tablet 50 mg.  0  . mirtazapine (REMERON) 30 MG tablet Take 1 tablet (30 mg total) by mouth at bedtime. 30 tablet 1  . pantoprazole (PROTONIX) 40 MG tablet Take 1 tablet (40 mg total) by mouth 2 (two) times daily. 60 tablet 1  . ranitidine (ZANTAC) 150 MG tablet Take 150 mg by mouth 2 (two) times daily.    . simvastatin (ZOCOR) 5 MG tablet Take 1 tablet (5 mg total) by mouth daily. 5 tablet 3  . temazepam (RESTORIL) 30 MG capsule Take 1 capsule (30 mg total) by mouth at bedtime as needed for sleep. 30 capsule 0   No current facility-administered medications for this visit.     SURGICAL HISTORY:  Past Surgical History:  Procedure Laterality Date  . BACK SURGERY     cyst removal  . COLONOSCOPY    . LAPAROTOMY N/A 02/25/2017   Procedure: EXPLORATORY LAPAROTOMY MODIFIED GRAHAM'S PATCH;  Surgeon: Kinsinger, Arta Bruce, MD;  Location: [facility];  Service: General;  Laterality: N/A;  . MINOR PLACEMENT OF FIDUCIAL N/A 01/19/2017   Procedure: MINOR PLACEMENT OF FIDUCIAL x 6;  Surgeon: Collene Gobble, MD;  Location: [facility];  Service: Thoracic;  Laterality: N/A;  . VIDEO BRONCHOSCOPY WITH ENDOBRONCHIAL NAVIGATION N/A 01/19/2017   Procedure: VIDEO BRONCHOSCOPY WITH ENDOBRONCHIAL NAVIGATION;  Surgeon: Collene Gobble, MD;  Location:  [facility];  Service: Thoracic;  Laterality: N/A;    REVIEW OF SYSTEMS:   Review of Systems  Constitutional: Negative for appetite change, chills, fatigue, fever and unexpected weight change.  HENT:   Negative for mouth sores, nosebleeds, sore throat and trouble swallowing.   Eyes: Negative for eye problems and icterus.  Respiratory: Negative for cough, hemoptysis, shortness of breath and wheezing.   Cardiovascular: Negative for chest pain and leg swelling.  Gastrointestinal: Negative for abdominal pain, constipation, diarrhea, nausea and vomiting.  Genitourinary: Negative for bladder incontinence, difficulty urinating, dysuria, frequency and hematuria.   Musculoskeletal: Negative for back pain, gait problem, neck pain and neck stiffness.  Skin: Negative for itching and rash.  Neurological: Negative for dizziness, extremity weakness, gait problem, headaches, light-headedness and seizures. Positive for peripheral neuropathy to his feet and hands. Hematological: Negative for adenopathy. Does not bruise/bleed easily.  Psychiatric/Behavioral: Negative for confusion, depression and sleep disturbance. The patient is not nervous/anxious.     PHYSICAL EXAMINATION:  Blood pressure 126/72, pulse 68, temperature 97.7 F (36.5 C), temperature source Oral, resp. rate 20, height 5\' 8"  (1.727 m), weight 220 lb 6.4 oz (100 kg), SpO2 95 %.  ECOG PERFORMANCE STATUS: 1 - Symptomatic but completely ambulatory  Physical Exam  Constitutional: Oriented to person, place, and time and well-developed, well-nourished, and in no distress. No distress.  HENT:  Head: Normocephalic and atraumatic.  Mouth/Throat: Oropharynx is clear and moist. No oropharyngeal exudate.  Eyes: Conjunctivae are normal. Right eye exhibits no discharge. Left eye exhibits no discharge. No scleral icterus.  Neck: Normal range of motion. Neck supple.  Cardiovascular: Normal rate, regular rhythm, normal heart sounds and intact distal pulses.    Pulmonary/Chest: Effort normal and breath sounds normal. No respiratory distress. No wheezes. No rales.  Abdominal: Soft. Bowel sounds are normal. Exhibits no distension and no mass. There is no tenderness.  Musculoskeletal: Normal range of motion. Exhibits no edema.  Lymphadenopathy:    No cervical adenopathy.  Neurological: Alert and oriented to person, place, and time. Exhibits normal muscle tone. Gait normal. Coordination normal.  Skin: Skin is warm and dry. No rash noted. Not diaphoretic. No erythema. No pallor.  Psychiatric: Mood, memory and judgment normal.  Vitals reviewed.  LABORATORY DATA: Lab Results  Component Value Date   WBC 9.9 12/12/2017   HGB 14.9 10/06/2017   HCT 42.9 12/12/2017   MCV 89.4 12/12/2017   PLT 227 12/12/2017      Chemistry      Component Value Date/Time   NA 139 12/12/2017 1318   NA 139 10/06/2017 1052   K 4.3 12/12/2017 1318   K 4.2 10/06/2017 1052   CL 104 12/12/2017 1318   CO2 26 12/12/2017 1318   CO2 25 10/06/2017 1052   BUN 11 12/12/2017 1318   BUN 13.2 10/06/2017 1052   CREATININE 0.85 12/12/2017  1318   CREATININE 0.9 10/06/2017 1052      Component Value Date/Time   CALCIUM 9.6 12/12/2017 1318   CALCIUM 9.5 10/06/2017 1052   ALKPHOS 60 12/12/2017 1318   ALKPHOS 68 10/06/2017 1052   AST 14 12/12/2017 1318   AST 14 10/06/2017 1052   ALT 17 12/12/2017 1318   ALT 13 10/06/2017 1052   BILITOT 0.4 12/12/2017 1318   BILITOT 0.44 10/06/2017 1052       RADIOGRAPHIC STUDIES:  No results found.   ASSESSMENT/PLAN:  Metastatic squamous cell carcinoma to lung, unspecified laterality [facility]) This is a very pleasant 67 year old white male with stage IV non-small cell lung cancer, squamous cell carcinoma presented with multiple bilateral pulmonary nodules. The patient was treated with systemic chemotherapy with carboplatin and paclitaxel status post 6 cycles. He tolerated the last cycle of his treatment well except for the persistent  peripheral neuropathy. The patient was then on observation for 3 months. He had a repeat CT scan of the chest, abdomen and pelvis that showed mild interval enlargement of left upper lobe as well as right upper lobe pulmonary nodules concerning for disease progression. He is now on second line treatment with immunotherapy with Nivolumab 480 mg IV every 4 weeks.  Status post 1 cycle.  He tolerated his first cycle fairly well. Recommend that he proceed with cycle 2 as scheduled today.  For the peripheral neuropathy, I offered him alternative treatment options including amitriptyline, Cymbalta, referral to a pain management clinic, or referral to our neuro-oncologist here in our office.   The patient would like to see our neuro-oncologist and a referral has been placed.  The patient has been advised that he is to take his gabapentin as previously prescribed which is 300 mg 4 times a day.  He is not to take more than prescribed.  I have updated Dr. Julien Nordmann as to the patient's overuse of gabapentin.   For depression, he will continue on Remeron 30 mg by mouth daily at bedtime.  For insomnia, he will continue on Restoril 30 mg at bedtime as needed.  He was provided a refill of this today.  The patient inquired about taking more than 30 mg at bedtime and he was advised not to do this and that he is to take the medication only as prescribed.  The patient will come back for follow-up visit in 4 weeks with the start of cycle #3.  He was advised to call immediately if he has any concerning symptoms in the interval. The patient voices understanding of current disease status and treatment options and is in agreement with the current care plan. All questions were answered. The patient knows to call the clinic with any problems, questions or concerns. We can certainly see the patient much sooner if necessary.  Orders Placed This Encounter  Procedures  . Ambulatory referral to Oncology    Referral Priority:    Routine    Referral Type:   Consultation    Referral Reason:   Specialty Services Required    Referred to Provider:   Ventura Sellers, MD    Number of Visits Requested:   [place], DNP, AGPCNP-BC, AOCNP 12/13/17

## 2017-12-12 NOTE — Progress Notes (Signed)
Received referral from Arjay SW regarding patient needing assistance with transportation cost.  Based on verbal income, patient qualifies for [employer]. Gave gas card based on this information.  Advised Abigail to have patient bring proof of income at next appointment to finalize grant.   She went to see patient and he had proof of income with him. Scanned in [place] and will see patient on 01/09/18.  My card was given for any additional financial questions or concerns and put a note in to see him on 01/09/18.

## 2017-12-12 NOTE — Telephone Encounter (Signed)
Scheduled appt per 2/25 los - Patient to get an updated schedule next visit.

## 2017-12-12 NOTE — Patient Instructions (Signed)
[facility] Discharge Instructions for Patients Receiving Chemotherapy  Today you received the following chemotherapy agents: Nivolumab (Opdivo)  To help prevent nausea and vomiting after your treatment, we encourage you to take your nausea medication as prescribed.    If you develop nausea and vomiting that is not controlled by your nausea medication, call the clinic.   BELOW ARE SYMPTOMS THAT SHOULD BE REPORTED IMMEDIATELY:  *FEVER GREATER THAN 100.5 F  *CHILLS WITH OR WITHOUT FEVER  NAUSEA AND VOMITING THAT IS NOT CONTROLLED WITH YOUR NAUSEA MEDICATION  *UNUSUAL SHORTNESS OF BREATH  *UNUSUAL BRUISING OR BLEEDING  TENDERNESS IN MOUTH AND THROAT WITH OR WITHOUT PRESENCE OF ULCERS  *URINARY PROBLEMS  *BOWEL PROBLEMS  UNUSUAL RASH Items with * indicate a potential emergency and should be followed up as soon as possible.  Feel free to call the clinic should you have any questions or concerns. The clinic phone number is [phone].  Please show the CHEMO ALERT CARD at check-in to the Emergency Department and triage nurse.   

## 2017-12-13 ENCOUNTER — Encounter: Payer: Self-pay | Admitting: Pharmacy Technician

## 2017-12-13 NOTE — Assessment & Plan Note (Signed)
This is a very pleasant 67 year old white male with stage IV non-small cell lung cancer, squamous cell carcinoma presented with multiple bilateral pulmonary nodules. The patient was treated with systemic chemotherapy with carboplatin and paclitaxel status post 6 cycles. He tolerated the last cycle of his treatment well except for the persistent peripheral neuropathy. The patient was then on observation for 3 months. He had a repeat CT scan of the chest, abdomen and pelvis that showed mild interval enlargement of left upper lobe as well as right upper lobe pulmonary nodules concerning for disease progression. He is now on second line treatment with immunotherapy with Nivolumab 480 mg IV every 4 weeks.  Status post 1 cycle.  He tolerated his first cycle fairly well. Recommend that he proceed with cycle 2 as scheduled today.  For the peripheral neuropathy, I offered him alternative treatment options including amitriptyline, Cymbalta, referral to a pain management clinic, or referral to our neuro-oncologist here in our office.   The patient would like to see our neuro-oncologist and a referral has been placed.  The patient has been advised that he is to take his gabapentin as previously prescribed which is 300 mg 4 times a day.  He is not to take more than prescribed.  I have updated Dr. Julien Nordmann as to the patient's overuse of gabapentin.   For depression, he will continue on Remeron 30 mg by mouth daily at bedtime.  For insomnia, he will continue on Restoril 30 mg at bedtime as needed.  He was provided a refill of this today.  The patient inquired about taking more than 30 mg at bedtime and he was advised not to do this and that he is to take the medication only as prescribed.  The patient will come back for follow-up visit in 4 weeks with the start of cycle #3.  He was advised to call immediately if he has any concerning symptoms in the interval. The patient voices understanding of current disease  status and treatment options and is in agreement with the current care plan. All questions were answered. The patient knows to call the clinic with any problems, questions or concerns. We can certainly see the patient much sooner if necessary.

## 2017-12-13 NOTE — Progress Notes (Signed)
I had the patient sign a drug assist application for Opdivo on 12/12/17. The patient now has [employer] insurance. OOP is going to high in relation to his mo SS income. He did not have his new insurance card on the day of Tx but said he would call in with the info on the card so we can process. Marguarite Arbour did get the patient a gas card since he lives in [place].  Rob

## 2017-12-15 ENCOUNTER — Telehealth: Payer: Self-pay | Admitting: *Deleted

## 2017-12-15 NOTE — Telephone Encounter (Signed)
Received referral from Dr Julien Nordmann regarding: peripheral neuropathy.  Appt scheduled and patient aware.

## 2017-12-19 ENCOUNTER — Encounter: Payer: Self-pay | Admitting: Pharmacy Technician

## 2017-12-19 NOTE — Progress Notes (Signed)
The patient is approved for drug assistance by [employer] for Opdivo. Enrollment is from 12/19/17 - 12/20/18 and is based on being under insured. Drug assistance will replace drug for DOS 12/12/17 and subsequent treatments.

## 2017-12-21 ENCOUNTER — Ambulatory Visit: Payer: BLUE CROSS/BLUE SHIELD | Admitting: Diagnostic Neuroimaging

## 2017-12-21 ENCOUNTER — Telehealth: Payer: Self-pay | Admitting: Medical Oncology

## 2017-12-21 NOTE — Telephone Encounter (Signed)
Fax lab order to [facility] is out of network.

## 2018-01-05 ENCOUNTER — Telehealth: Payer: Self-pay | Admitting: Medical Oncology

## 2018-01-05 NOTE — Telephone Encounter (Signed)
Needs handicap placard.  asking if nivolumab on monday is auth with [employer]. Message to [place].

## 2018-01-09 ENCOUNTER — Telehealth: Payer: Self-pay | Admitting: Medical Oncology

## 2018-01-09 ENCOUNTER — Other Ambulatory Visit: Payer: BLUE CROSS/BLUE SHIELD

## 2018-01-09 ENCOUNTER — Ambulatory Visit: Payer: BLUE CROSS/BLUE SHIELD

## 2018-01-09 ENCOUNTER — Ambulatory Visit: Payer: BLUE CROSS/BLUE SHIELD | Admitting: Internal Medicine

## 2018-01-09 ENCOUNTER — Other Ambulatory Visit: Payer: Self-pay | Admitting: Medical Oncology

## 2018-01-09 ENCOUNTER — Ambulatory Visit: Payer: BLUE CROSS/BLUE SHIELD | Admitting: Oncology

## 2018-01-09 DIAGNOSIS — G4700 Insomnia, unspecified: Secondary | ICD-10-CM

## 2018-01-09 DIAGNOSIS — C7800 Secondary malignant neoplasm of unspecified lung: Secondary | ICD-10-CM

## 2018-01-09 MED ORDER — TEMAZEPAM 30 MG PO CAPS: 30.0000 mg | ORAL_CAPSULE | Freq: Every evening | ORAL | 0 refills | Status: DC | PRN

## 2018-01-09 NOTE — Telephone Encounter (Signed)
Cannot come to appts today -his brother passed away this weekend. I told pt to call when he has his new insurance card and we will r/s his appt.

## 2018-01-16 ENCOUNTER — Encounter (HOSPITAL_COMMUNITY): Payer: BLUE CROSS/BLUE SHIELD

## 2018-01-16 ENCOUNTER — Ambulatory Visit: Payer: BLUE CROSS/BLUE SHIELD | Admitting: Family

## 2018-01-16 DIAGNOSIS — I10 Essential (primary) hypertension: Secondary | ICD-10-CM | POA: Diagnosis not present

## 2018-01-16 DIAGNOSIS — G63 Polyneuropathy in diseases classified elsewhere: Secondary | ICD-10-CM | POA: Diagnosis not present

## 2018-01-16 DIAGNOSIS — R0602 Shortness of breath: Secondary | ICD-10-CM | POA: Diagnosis not present

## 2018-01-16 DIAGNOSIS — C3490 Malignant neoplasm of unspecified part of unspecified bronchus or lung: Secondary | ICD-10-CM | POA: Diagnosis not present

## 2018-01-16 DIAGNOSIS — R0789 Other chest pain: Secondary | ICD-10-CM | POA: Diagnosis not present

## 2018-01-17 ENCOUNTER — Telehealth: Payer: Self-pay | Admitting: Medical Oncology

## 2018-01-17 ENCOUNTER — Telehealth: Payer: Self-pay | Admitting: Internal Medicine

## 2018-01-17 NOTE — Telephone Encounter (Signed)
Spoke with patient re appointments for 4/5. Per patient lab not needed due to he has lab in hospital. Patient informed message would be sent to nurse asking that she call him if lab not needed.    Reply to 4/2 schedule message informing nurse re above.

## 2018-01-17 NOTE — Telephone Encounter (Signed)
Needs appt -has new insurance. Schedule request sent.

## 2018-01-18 ENCOUNTER — Telehealth: Payer: Self-pay | Admitting: Medical Oncology

## 2018-01-18 DIAGNOSIS — C7800 Secondary malignant neoplasm of unspecified lung: Secondary | ICD-10-CM

## 2018-01-18 NOTE — Telephone Encounter (Signed)
Left message that pt does need labs on Friday done here.

## 2018-01-20 ENCOUNTER — Telehealth: Payer: Self-pay

## 2018-01-20 ENCOUNTER — Inpatient Hospital Stay: Payer: Medicare Other | Attending: Internal Medicine | Admitting: Oncology

## 2018-01-20 ENCOUNTER — Encounter: Payer: Self-pay | Admitting: Oncology

## 2018-01-20 ENCOUNTER — Inpatient Hospital Stay: Payer: Medicare Other

## 2018-01-20 ENCOUNTER — Telehealth: Payer: Self-pay | Admitting: Internal Medicine

## 2018-01-20 VITALS — BP 121/65 | HR 67 | Temp 98.8°F | Resp 17 | Ht 68.0 in | Wt 213.0 lb

## 2018-01-20 DIAGNOSIS — C7800 Secondary malignant neoplasm of unspecified lung: Secondary | ICD-10-CM

## 2018-01-20 DIAGNOSIS — G4700 Insomnia, unspecified: Secondary | ICD-10-CM | POA: Insufficient documentation

## 2018-01-20 DIAGNOSIS — C3492 Malignant neoplasm of unspecified part of left bronchus or lung: Secondary | ICD-10-CM | POA: Diagnosis not present

## 2018-01-20 DIAGNOSIS — C3491 Malignant neoplasm of unspecified part of right bronchus or lung: Secondary | ICD-10-CM | POA: Insufficient documentation

## 2018-01-20 DIAGNOSIS — F329 Major depressive disorder, single episode, unspecified: Secondary | ICD-10-CM | POA: Insufficient documentation

## 2018-01-20 DIAGNOSIS — G629 Polyneuropathy, unspecified: Secondary | ICD-10-CM | POA: Insufficient documentation

## 2018-01-20 DIAGNOSIS — C7931 Secondary malignant neoplasm of brain: Secondary | ICD-10-CM | POA: Insufficient documentation

## 2018-01-20 DIAGNOSIS — G609 Hereditary and idiopathic neuropathy, unspecified: Secondary | ICD-10-CM

## 2018-01-20 DIAGNOSIS — G4709 Other insomnia: Secondary | ICD-10-CM

## 2018-01-20 DIAGNOSIS — Z5112 Encounter for antineoplastic immunotherapy: Secondary | ICD-10-CM | POA: Insufficient documentation

## 2018-01-20 LAB — CMP (CANCER CENTER ONLY)
ALK PHOS: 61 U/L (ref 40–150)
ALT: 16 U/L (ref 0–55)
AST: 14 U/L (ref 5–34)
Albumin: 3.8 g/dL (ref 3.5–5.0)
Anion gap: 9 (ref 3–11)
BUN: 15 mg/dL (ref 7–26)
CALCIUM: 9.6 mg/dL (ref 8.4–10.4)
CO2: 25 mmol/L (ref 22–29)
CREATININE: 0.92 mg/dL (ref 0.70–1.30)
Chloride: 101 mmol/L (ref 98–109)
GFR, Estimated: >60 mL/min (ref >60)
Glucose, Bld: 150 mg/dL — ABNORMAL HIGH (ref 70–140)
Potassium: 4.4 mmol/L (ref 3.5–5.1)
Sodium: 135 mmol/L — ABNORMAL LOW (ref 136–145)
Total Bilirubin: 0.3 mg/dL (ref 0.2–1.2)
Total Protein: 7.4 g/dL (ref 6.4–8.3)

## 2018-01-20 LAB — CBC WITH DIFFERENTIAL (CANCER CENTER ONLY)
Basophils Absolute: 0.1 10*3/uL (ref 0.0–0.1)
Basophils Relative: 1 %
EOS ABS: 0.3 10*3/uL (ref 0.0–0.5)
Eosinophils Relative: 3 %
HCT: 43.5 % (ref 38.4–49.9)
Hemoglobin: 14.2 g/dL (ref 13.0–17.1)
Lymphocytes Relative: 42 %
Lymphs Abs: 4.1 10*3/uL — ABNORMAL HIGH (ref 0.9–3.3)
MCH: 29.5 pg (ref 27.2–33.4)
MCHC: 32.6 g/dL (ref 32.0–36.0)
MCV: 90.5 fL (ref 79.3–98.0)
MONO ABS: 0.7 10*3/uL (ref 0.1–0.9)
MONOS PCT: 7 %
Neutro Abs: 4.7 10*3/uL (ref 1.5–6.5)
Neutrophils Relative %: 47 %
Platelet Count: 326 10*3/uL (ref 140–400)
RBC: 4.81 MIL/uL (ref 4.20–5.82)
RDW: 14.8 % — AB (ref 11.0–14.6)
WBC Count: 9.9 10*3/uL (ref 4.0–10.3)

## 2018-01-20 LAB — TSH: TSH: 0.611 u[IU]/mL (ref 0.320–4.118)

## 2018-01-20 MED ORDER — SODIUM CHLORIDE 0.9 % IV SOLN
480.0000 mg | Freq: Once | INTRAVENOUS | Status: AC
  Administered 2018-01-20: 480 mg via INTRAVENOUS
  Filled 2018-01-20: qty 48

## 2018-01-20 MED ORDER — SODIUM CHLORIDE 0.9 % IV SOLN
Freq: Once | INTRAVENOUS | Status: AC
  Administered 2018-01-20: 15:00:00 via INTRAVENOUS

## 2018-01-20 MED ORDER — MIRTAZAPINE 30 MG PO TABS: 30.0000 mg | ORAL_TABLET | Freq: Every day | ORAL | 1 refills | Status: DC

## 2018-01-20 NOTE — Patient Instructions (Signed)
[facility] Discharge Instructions for Patients Receiving Chemotherapy  Today you received the following chemotherapy agents :  Opdivo.  To help prevent nausea and vomiting after your treatment, we encourage you to take your nausea medication as prescribed.   If you develop nausea and vomiting that is not controlled by your nausea medication, call the clinic.   BELOW ARE SYMPTOMS THAT SHOULD BE REPORTED IMMEDIATELY:  *FEVER GREATER THAN 100.5 F  *CHILLS WITH OR WITHOUT FEVER  NAUSEA AND VOMITING THAT IS NOT CONTROLLED WITH YOUR NAUSEA MEDICATION  *UNUSUAL SHORTNESS OF BREATH  *UNUSUAL BRUISING OR BLEEDING  TENDERNESS IN MOUTH AND THROAT WITH OR WITHOUT PRESENCE OF ULCERS  *URINARY PROBLEMS  *BOWEL PROBLEMS  UNUSUAL RASH Items with * indicate a potential emergency and should be followed up as soon as possible.  Feel free to call the clinic should you have any questions or concerns. The clinic phone number is [phone].  Please show the CHEMO ALERT CARD at check-in to the Emergency Department and triage nurse.   

## 2018-01-20 NOTE — Telephone Encounter (Signed)
Call day switch - moved 4/23 appointments to AM. Spoke with relative and patient will get updated schedule at today's visit.

## 2018-01-20 NOTE — Telephone Encounter (Signed)
Printed avs and calender of upcoming appointment. Per 4/5 los

## 2018-01-20 NOTE — Assessment & Plan Note (Signed)
This is a very pleasant 67 year old white male with stage IV non-small cell lung cancer, squamous cell carcinoma presented with multiple bilateral pulmonary nodules. The patient was treated with systemic chemotherapy with carboplatin and paclitaxel status post 6 cycles. He tolerated the last cycle of his treatment well except for the persistent peripheral neuropathy. The patient was then on observation for 3 months. He had a repeat CT scan of the chest, abdomen and pelvis that showed mild interval enlargement of left upper lobe as well as right upper lobe pulmonary nodules concerning for disease progression. He is now on second line treatment with immunotherapy with Nivolumab 480 mg IV every 4 weeks.  Status post 2 cycles.  He is tolerating Nivolumab fairly well without any concerning adverse effects. Recommend that he proceed with cycle 3 as scheduled today as scheduled.  For the peripheral neuropathy,  he will continue gabapentin as prescribed.  He missed an appointment with Dr. Mickeal Skinner and would like to reschedule this.  I have sent a message to Dr. Renda Rolls nurse to try to get him rescheduled.  For depression, he has not been taking his Remeron.  We discussed that he should resume his Remeron and a new prescription was sent to his pharmacy.  This may also help his insomnia.  For insomnia, he will continue on Restoril 30 mg at bedtime as needed.  I have advised against taking more than 30 mg of Restoril at bedtime.  We discussed sleep hygiene.  The patient will have a restaging CT scan of the chest, abdomen, pelvis prior to his next visit.  The patient will come back for follow-up visit in 4 weeks  to review his restaging CT scan results and for evaluation prior to cycle #4 of his treatment.  He was advised to call immediately if he has any concerning symptoms in the interval. The patient voices understanding of current disease status and treatment options and is in agreement with the current  care plan. All questions were answered. The patient knows to call the clinic with any problems, questions or concerns. We can certainly see the patient much sooner if necessary.

## 2018-01-20 NOTE — Progress Notes (Signed)
[facility] OFFICE PROGRESS NOTE  [name], MD [place] [address]  DIAGNOSIS: Stage IV (T3, N0, M1 a) non-small cell lung cancer, squamous cell carcinoma presented with multiple bilateral pulmonary nodules diagnosed in April 2018.  PRIOR THERAPY: Systemic chemotherapy with carboplatin for AUC of 5 and paclitaxel 175 MG/M2 every 3 weeks with Neulasta support. Status post 6 cycles.  CURRENT THERAPY: Second line treatment with immunotherapy was Nivolumab 480 mg IV every 4 weeks.First cycle November 14, 2017.  Status post 2 cycles.  INTERVAL HISTORY: [name] [name] 67 y.o. male returns for routine follow-up visit by himself.  The patient is feeling fine today and has no specific complaints except for ongoing neuropathy insomnia.  He continues to take gabapentin for his neuropathy without much improvement.  The patient takes Restoril 30 mg at bedtime and finds that he only sleeps for a few hours.  He is asking for higher dose of Restoril.  The patient denies fevers and chills.  Denies chest pain, shortness of breath, hemoptysis.  He has an ongoing nonproductive cough.  Denies nausea, vomiting, constipation, diarrhea.  Denies skin rashes.  The patient is here for evaluation prior to cycle #3 of his treatment.  MEDICAL HISTORY: Past Medical History:  Diagnosis Date  . Brain metastases ([place]) 02/15/2017  . Depression 01/29/2017  . Dyspnea   . Encounter for antineoplastic chemotherapy 01/29/2017  . Goals of care, counseling/discussion 01/29/2017  . Hypertension   . Metastatic squamous cell carcinoma to lung, unspecified laterality ([place]) 01/28/2017  . Stroke [facility])     ALLERGIES:  is allergic to no known allergies.  MEDICATIONS:  Current Outpatient Medications  Medication Sig Dispense Refill  . acetaminophen (TYLENOL) 325 MG tablet You can take 2 every 4 hours as needed.  You can buy it over the counter DO NOT TAKE MORE THAN 4000 MG OF TYLENOL PER DAY.  IT CAN HARM YOUR  LIVER.    Marland Kitchen aspirin EC 81 MG tablet Take 81 mg by mouth daily.    Marland Kitchen gabapentin (NEURONTIN) 300 MG capsule Take 1 capsule (300 mg total) by mouth 4 (four) times daily. (Patient taking differently: Take 300 mg by mouth 4 (four) times daily. Vickki Muff, MD told patient can take up to 3600 mg/day  taking 2 capsules four times daily) 90 capsule 1  . hydrochlorothiazide (HYDRODIURIL) 25 MG tablet Restart this after you see your Primary care doctor.  Check your blood pressure at home and record.    Marland Kitchen lisinopril (PRINIVIL,ZESTRIL) 40 MG tablet Take 40 mg by mouth daily.    . metoprolol succinate (TOPROL-XL) 50 MG 24 hr tablet 50 mg.  0  . mirtazapine (REMERON) 30 MG tablet Take 1 tablet (30 mg total) by mouth at bedtime. 30 tablet 1  . pantoprazole (PROTONIX) 40 MG tablet Take 1 tablet (40 mg total) by mouth 2 (two) times daily. 60 tablet 1  . ranitidine (ZANTAC) 150 MG tablet Take 150 mg by mouth 2 (two) times daily.    . simvastatin (ZOCOR) 5 MG tablet Take 1 tablet (5 mg total) by mouth daily. 5 tablet 3  . temazepam (RESTORIL) 30 MG capsule Take 1 capsule (30 mg total) by mouth at bedtime as needed for sleep. 30 capsule 0   No current facility-administered medications for this visit.     SURGICAL HISTORY:  Past Surgical History:  Procedure Laterality Date  . BACK SURGERY     cyst removal  . COLONOSCOPY    .  LAPAROTOMY N/A 02/25/2017   Procedure: EXPLORATORY LAPAROTOMY MODIFIED GRAHAM'S PATCH;  Surgeon: Kinsinger, Arta Bruce, MD;  Location: [facility];  Service: General;  Laterality: N/A;  . MINOR PLACEMENT OF FIDUCIAL N/A 01/19/2017   Procedure: MINOR PLACEMENT OF FIDUCIAL x 6;  Surgeon: Collene Gobble, MD;  Location: [facility];  Service: Thoracic;  Laterality: N/A;  . VIDEO BRONCHOSCOPY WITH ENDOBRONCHIAL NAVIGATION N/A 01/19/2017   Procedure: VIDEO BRONCHOSCOPY WITH ENDOBRONCHIAL NAVIGATION;  Surgeon: Collene Gobble, MD;  Location: [facility];  Service: Thoracic;  Laterality: N/A;    REVIEW OF SYSTEMS:    Review of Systems  Constitutional: Negative for appetite change, chills, fatigue, fever and unexpected weight change.  HENT:   Negative for mouth sores, nosebleeds, sore throat and trouble swallowing.   Eyes: Negative for eye problems and icterus.  Respiratory: Negative for hemoptysis, shortness of breath and wheezing.  Positive for cough.  Cardiovascular: Negative for chest pain and leg swelling.  Gastrointestinal: Negative for abdominal pain, constipation, diarrhea, nausea and vomiting.  Genitourinary: Negative for bladder incontinence, difficulty urinating, dysuria, frequency and hematuria.   Musculoskeletal: Negative for back pain, gait problem, neck pain and neck stiffness.  Skin: Negative for itching and rash.  Neurological: Negative for dizziness, extremity weakness, gait problem, headaches, light-headedness and seizures. Positive for neuropathy to his bilateral feet. Hematological: Negative for adenopathy. Does not bruise/bleed easily.  Psychiatric/Behavioral: Negative for confusion, depression. The patient is not nervous/anxious.  Positive for insomnia.   PHYSICAL EXAMINATION:  Blood pressure 121/65, pulse 67, temperature 98.8 F (37.1 C), temperature source Oral, resp. rate 17, height 5\' 8"  (1.727 m), weight 213 lb (96.6 kg), SpO2 95 %.  ECOG PERFORMANCE STATUS: 1 - Symptomatic but completely ambulatory  Physical Exam  Constitutional: Oriented to person, place, and time and well-developed, well-nourished, and in no distress. No distress.  HENT:  Head: Normocephalic and atraumatic.  Mouth/Throat: Oropharynx is clear and moist. No oropharyngeal exudate.  Eyes: Conjunctivae are normal. Right eye exhibits no discharge. Left eye exhibits no discharge. No scleral icterus.  Neck: Normal range of motion. Neck supple.  Cardiovascular: Normal rate, regular rhythm, normal heart sounds and intact distal pulses.   Pulmonary/Chest: Effort normal and breath sounds normal. No respiratory  distress. No wheezes. No rales.  Abdominal: Soft. Bowel sounds are normal. Exhibits no distension and no mass. There is no tenderness.  Musculoskeletal: Normal range of motion. Exhibits no edema.  Lymphadenopathy:    No cervical adenopathy.  Neurological: Alert and oriented to person, place, and time. Exhibits normal muscle tone. Gait normal. Coordination normal.  Skin: Skin is warm and dry. No rash noted. Not diaphoretic. No erythema. No pallor.  Psychiatric: Mood, memory and judgment normal.  Vitals reviewed.  LABORATORY DATA: Lab Results  Component Value Date   WBC 9.9 01/20/2018   HGB 14.9 10/06/2017   HCT 43.5 01/20/2018   MCV 90.5 01/20/2018   PLT 326 01/20/2018      Chemistry      Component Value Date/Time   NA 135 (L) 01/20/2018 1202   NA 139 10/06/2017 1052   K 4.4 01/20/2018 1202   K 4.2 10/06/2017 1052   CL 101 01/20/2018 1202   CO2 25 01/20/2018 1202   CO2 25 10/06/2017 1052   BUN 15 01/20/2018 1202   BUN 13.2 10/06/2017 1052   CREATININE 0.92 01/20/2018 1202   CREATININE 0.9 10/06/2017 1052      Component Value Date/Time   CALCIUM 9.6 01/20/2018 1202   CALCIUM 9.5 10/06/2017  1052   ALKPHOS 61 01/20/2018 1202   ALKPHOS 68 10/06/2017 1052   AST 14 01/20/2018 1202   AST 14 10/06/2017 1052   ALT 16 01/20/2018 1202   ALT 13 10/06/2017 1052   BILITOT 0.3 01/20/2018 1202   BILITOT 0.44 10/06/2017 1052       RADIOGRAPHIC STUDIES:  No results found.   ASSESSMENT/PLAN:  Metastatic squamous cell carcinoma to lung, unspecified laterality [facility]) This is a very pleasant 67 year old white male with stage IV non-small cell lung cancer, squamous cell carcinoma presented with multiple bilateral pulmonary nodules. The patient was treated with systemic chemotherapy with carboplatin and paclitaxel status post 6 cycles. He tolerated the last cycle of his treatment well except for the persistent peripheral neuropathy. The patient was then on observation for 3  months. He had a repeat CT scan of the chest, abdomen and pelvis that showed mild interval enlargement of left upper lobe as well as right upper lobe pulmonary nodules concerning for disease progression. He is now on second line treatment with immunotherapy with Nivolumab 480 mg IV every 4 weeks.  Status post 2 cycles.  He  is tolerating Nivolumab fairly well without any concerning adverse effects. Recommend that he proceed with cycle 3 as scheduled today as scheduled.  For the peripheral neuropathy,  he will continue gabapentin as prescribed.  He missed an appointment with Dr. Mickeal Skinner and would like to reschedule this.  I have sent a message to Dr. Renda Rolls nurse to try to get him rescheduled.  For depression, he has not been taking his Remeron.  We discussed that he should resume his Remeron and a new prescription was sent to his pharmacy.  This may also help his insomnia.  For insomnia, he will continue on Restoril 30 mg at bedtime as needed.  I have advised against taking more than 30 mg of Restoril at bedtime.  We discussed sleep hygiene.  The patient will have a restaging CT scan of the chest, abdomen, pelvis prior to his next visit.  The patient will come back for follow-up visit in 4 weeks  to review his restaging CT scan results and for evaluation prior to cycle #4 of his treatment.  He was advised to call immediately if he has any concerning symptoms in the interval. The patient voices understanding of current disease status and treatment options and is in agreement with the current care plan. All questions were answered. The patient knows to call the clinic with any problems, questions or concerns. We can certainly see the patient much sooner if necessary.   Orders Placed This Encounter  Procedures  . CT ABDOMEN PELVIS W CONTRAST    Standing Status:   Future    Standing Expiration Date:   01/21/2019    Order Specific Question:   If indicated for the ordered procedure, I authorize  the administration of contrast media per Radiology protocol    Answer:   Yes    Order Specific Question:   Preferred imaging location?    Answer:   [facility]    Order Specific Question:   Radiology Contrast Protocol - do NOT remove file path    Answer:   \\[email or web address]    Order Specific Question:   Reason for Exam additional comments    Answer:   Lung cancer. Restaging.  . CT CHEST W CONTRAST    Standing Status:   Future    Standing Expiration Date:   01/21/2019    Order Specific  Question:   If indicated for the ordered procedure, I authorize the administration of contrast media per Radiology protocol    Answer:   Yes    Order Specific Question:   Preferred imaging location?    Answer:   [facility]    Order Specific Question:   Radiology Contrast Protocol - do NOT remove file path    Answer:   \\[email or web address]    Order Specific Question:   Reason for Exam additional comments    Answer:   Lung cancer. Restaging.   Mikey Bussing, DNP, AGPCNP-BC, AOCNP 01/20/18

## 2018-01-25 ENCOUNTER — Telehealth: Payer: Self-pay | Admitting: Medical Oncology

## 2018-01-25 NOTE — Telephone Encounter (Signed)
mailed handicap application to pt.

## 2018-01-30 ENCOUNTER — Telehealth: Payer: Self-pay | Admitting: Medical Oncology

## 2018-01-30 ENCOUNTER — Other Ambulatory Visit: Payer: Self-pay | Admitting: Medical Oncology

## 2018-01-30 DIAGNOSIS — C7800 Secondary malignant neoplasm of unspecified lung: Secondary | ICD-10-CM

## 2018-01-30 DIAGNOSIS — Z5111 Encounter for antineoplastic chemotherapy: Secondary | ICD-10-CM

## 2018-01-30 DIAGNOSIS — C7931 Secondary malignant neoplasm of brain: Secondary | ICD-10-CM

## 2018-01-30 MED ORDER — GABAPENTIN 300 MG PO CAPS: 300.0000 mg | ORAL_CAPSULE | Freq: Four times a day (QID) | ORAL | 1 refills | Status: DC

## 2018-01-30 NOTE — Telephone Encounter (Signed)
Gabapentin refill requested and handicap paperwork.

## 2018-01-30 NOTE — Telephone Encounter (Signed)
Told pt refill done and handicap application mailed last week.

## 2018-02-07 ENCOUNTER — Other Ambulatory Visit: Payer: BLUE CROSS/BLUE SHIELD

## 2018-02-07 ENCOUNTER — Ambulatory Visit: Payer: BLUE CROSS/BLUE SHIELD

## 2018-02-07 ENCOUNTER — Ambulatory Visit: Payer: BLUE CROSS/BLUE SHIELD | Admitting: Internal Medicine

## 2018-02-14 ENCOUNTER — Telehealth: Payer: Self-pay | Admitting: *Deleted

## 2018-02-14 ENCOUNTER — Other Ambulatory Visit: Payer: Self-pay | Admitting: Internal Medicine

## 2018-02-14 ENCOUNTER — Other Ambulatory Visit: Payer: Self-pay | Admitting: *Deleted

## 2018-02-14 DIAGNOSIS — G4700 Insomnia, unspecified: Secondary | ICD-10-CM

## 2018-02-14 DIAGNOSIS — C7800 Secondary malignant neoplasm of unspecified lung: Secondary | ICD-10-CM

## 2018-02-14 MED ORDER — TEMAZEPAM 30 MG PO CAPS: 30.0000 mg | ORAL_CAPSULE | Freq: Every evening | ORAL | 0 refills | Status: DC | PRN

## 2018-02-14 NOTE — Telephone Encounter (Signed)
"  I need a refill from Dr. Julien Nordmann."

## 2018-02-14 NOTE — Telephone Encounter (Signed)
Restoril called into pt Pharmacy, pt notified to pick up

## 2018-02-16 ENCOUNTER — Telehealth: Payer: Self-pay | Admitting: Internal Medicine

## 2018-02-16 ENCOUNTER — Other Ambulatory Visit: Payer: Self-pay

## 2018-02-16 ENCOUNTER — Inpatient Hospital Stay: Payer: Medicare Other

## 2018-02-16 ENCOUNTER — Inpatient Hospital Stay: Payer: Medicare Other | Admitting: Internal Medicine

## 2018-02-16 ENCOUNTER — Encounter: Payer: Self-pay | Admitting: Internal Medicine

## 2018-02-16 ENCOUNTER — Inpatient Hospital Stay: Payer: Medicare Other | Attending: Internal Medicine | Admitting: Internal Medicine

## 2018-02-16 ENCOUNTER — Ambulatory Visit (HOSPITAL_COMMUNITY)
Admission: RE | Admit: 2018-02-16 | Discharge: 2018-02-16 | Disposition: A | Discharge status: Home / Self Care | Payer: Medicare Other | Source: Ambulatory Visit | Attending: Oncology | Admitting: Oncology

## 2018-02-16 VITALS — BP 143/75 | HR 64 | Temp 98.5°F | Resp 17 | Ht 68.0 in | Wt 217.6 lb

## 2018-02-16 DIAGNOSIS — C7801 Secondary malignant neoplasm of right lung: Secondary | ICD-10-CM | POA: Diagnosis not present

## 2018-02-16 DIAGNOSIS — C7931 Secondary malignant neoplasm of brain: Secondary | ICD-10-CM

## 2018-02-16 DIAGNOSIS — J439 Emphysema, unspecified: Secondary | ICD-10-CM | POA: Insufficient documentation

## 2018-02-16 DIAGNOSIS — G620 Drug-induced polyneuropathy: Secondary | ICD-10-CM

## 2018-02-16 DIAGNOSIS — Z79899 Other long term (current) drug therapy: Secondary | ICD-10-CM | POA: Insufficient documentation

## 2018-02-16 DIAGNOSIS — C3490 Malignant neoplasm of unspecified part of unspecified bronchus or lung: Secondary | ICD-10-CM | POA: Diagnosis not present

## 2018-02-16 DIAGNOSIS — E278 Other specified disorders of adrenal gland: Secondary | ICD-10-CM | POA: Diagnosis not present

## 2018-02-16 DIAGNOSIS — C7800 Secondary malignant neoplasm of unspecified lung: Secondary | ICD-10-CM

## 2018-02-16 DIAGNOSIS — Z5112 Encounter for antineoplastic immunotherapy: Secondary | ICD-10-CM | POA: Insufficient documentation

## 2018-02-16 DIAGNOSIS — I714 Abdominal aortic aneurysm, without rupture: Secondary | ICD-10-CM | POA: Insufficient documentation

## 2018-02-16 DIAGNOSIS — I700 Atherosclerosis of aorta: Secondary | ICD-10-CM | POA: Insufficient documentation

## 2018-02-16 DIAGNOSIS — C7802 Secondary malignant neoplasm of left lung: Secondary | ICD-10-CM | POA: Diagnosis not present

## 2018-02-16 DIAGNOSIS — G609 Hereditary and idiopathic neuropathy, unspecified: Secondary | ICD-10-CM

## 2018-02-16 DIAGNOSIS — K439 Ventral hernia without obstruction or gangrene: Secondary | ICD-10-CM | POA: Insufficient documentation

## 2018-02-16 LAB — CBC WITH DIFFERENTIAL (CANCER CENTER ONLY)
BASOS ABS: 0.1 10*3/uL (ref 0.0–0.1)
BASOS PCT: 1 %
Eosinophils Absolute: 0.3 10*3/uL (ref 0.0–0.5)
Eosinophils Relative: 3 %
HEMATOCRIT: 41.5 % (ref 38.4–49.9)
HEMOGLOBIN: 13.8 g/dL (ref 13.0–17.1)
LYMPHS PCT: 42 %
Lymphs Abs: 4 10*3/uL — ABNORMAL HIGH (ref 0.9–3.3)
MCH: 29.6 pg (ref 27.2–33.4)
MCHC: 33.4 g/dL (ref 32.0–36.0)
MCV: 88.5 fL (ref 79.3–98.0)
Monocytes Absolute: 0.8 10*3/uL (ref 0.1–0.9)
Monocytes Relative: 8 %
NEUTROS ABS: 4.4 10*3/uL (ref 1.5–6.5)
NEUTROS PCT: 46 %
Platelet Count: 257 10*3/uL (ref 140–400)
RBC: 4.68 MIL/uL (ref 4.20–5.82)
RDW: 15 % — ABNORMAL HIGH (ref 11.0–14.6)
WBC Count: 9.6 10*3/uL (ref 4.0–10.3)

## 2018-02-16 LAB — CMP (CANCER CENTER ONLY)
ALBUMIN: 3.9 g/dL (ref 3.5–5.0)
ALT: 15 U/L (ref 0–55)
AST: 15 U/L (ref 5–34)
Alkaline Phosphatase: 67 U/L (ref 40–150)
Anion gap: 8 (ref 3–11)
BILIRUBIN TOTAL: 0.5 mg/dL (ref 0.2–1.2)
BUN: 14 mg/dL (ref 7–26)
CALCIUM: 9.8 mg/dL (ref 8.4–10.4)
CO2: 26 mmol/L (ref 22–29)
Chloride: 104 mmol/L (ref 98–109)
Creatinine: 0.84 mg/dL (ref 0.70–1.30)
GFR, Est AFR Am: >60 mL/min (ref >60)
GLUCOSE: 101 mg/dL (ref 70–140)
POTASSIUM: 4.3 mmol/L (ref 3.5–5.1)
Sodium: 138 mmol/L (ref 136–145)
TOTAL PROTEIN: 7.5 g/dL (ref 6.4–8.3)

## 2018-02-16 LAB — TSH

## 2018-02-16 MED ORDER — SODIUM CHLORIDE 0.9 % IV SOLN
Freq: Once | INTRAVENOUS | Status: AC
  Administered 2018-02-16: 13:00:00 via INTRAVENOUS

## 2018-02-16 MED ORDER — NIVOLUMAB CHEMO INJECTION 100 MG/10ML
480.0000 mg | Freq: Once | INTRAVENOUS | Status: AC
  Administered 2018-02-16: 480 mg via INTRAVENOUS
  Filled 2018-02-16: qty 48

## 2018-02-16 MED ORDER — AMITRIPTYLINE HCL 50 MG PO TABS: 50.0000 mg | ORAL_TABLET | Freq: Every day | ORAL | 3 refills | Status: DC

## 2018-02-16 MED ORDER — IOHEXOL 300 MG/ML  SOLN
100.0000 mL | Freq: Once | INTRAMUSCULAR | Status: AC | PRN
  Administered 2018-02-16: 100 mL via INTRAVENOUS

## 2018-02-16 NOTE — Progress Notes (Signed)
[facility] [phone]   [phone]  OFFICE PROGRESS NOTE  [name], MD [place] [address]  DIAGNOSIS: Stage IV (T3, N0, M1 a) non-small cell lung cancer, squamous cell carcinoma presented with multiple bilateral pulmonary nodules diagnosed in April 2018.  PRIOR THERAPY:  Systemic chemotherapy with carboplatin for AUC of 5 and paclitaxel 175 MG/M2 every 3 weeks with Neulasta support. Status post 6 cycles.  CURRENT THERAPY: Second line treatment with immunotherapy was Nivolumab 480 mg IV every 4 weeks.  First cycle November 14, 2017.  INTERVAL HISTORY: [name] [name] 67 y.o. male returns to the clinic today for follow-up visit.  The patient is feeling fine today with no specific complaints.  He continues to tolerate his treatment fairly well with no significant adverse effects.  He denied having any skin rash or diarrhea.  He denied having any weight loss or night sweats.  He has no nausea, vomiting, diarrhea or constipation.  He was supposed to have repeat CT scan of the chest, abdomen and pelvis before his visit but unfortunately the scan is a scheduled to be performed later today.  MEDICAL HISTORY: Past Medical History:  Diagnosis Date  . Brain metastases ([place]) 02/15/2017  . Depression 01/29/2017  . Dyspnea   . Encounter for antineoplastic chemotherapy 01/29/2017  . Goals of care, counseling/discussion 01/29/2017  . Hypertension   . Metastatic squamous cell carcinoma to lung, unspecified laterality ([place]) 01/28/2017  . Stroke [facility])     ALLERGIES:  is allergic to no known allergies.  MEDICATIONS:  Current Outpatient Medications  Medication Sig Dispense Refill  . acetaminophen (TYLENOL) 325 MG tablet You can take 2 every 4 hours as needed.  You can buy it over the counter DO NOT TAKE MORE THAN 4000 MG OF TYLENOL PER DAY.  IT CAN HARM YOUR LIVER.    Marland Kitchen aspirin EC 81 MG tablet Take 81 mg by mouth daily.    Marland Kitchen gabapentin (NEURONTIN)  300 MG capsule Take 1 capsule (300 mg total) by mouth 4 (four) times daily. 90 capsule 1  . hydrochlorothiazide (HYDRODIURIL) 25 MG tablet Restart this after you see your Primary care doctor.  Check your blood pressure at home and record.    Marland Kitchen lisinopril (PRINIVIL,ZESTRIL) 40 MG tablet Take 40 mg by mouth daily.    . metoprolol succinate (TOPROL-XL) 50 MG 24 hr tablet 50 mg.  0  . mirtazapine (REMERON) 30 MG tablet Take 1 tablet (30 mg total) by mouth at bedtime. 30 tablet 1  . pantoprazole (PROTONIX) 40 MG tablet Take 1 tablet (40 mg total) by mouth 2 (two) times daily. 60 tablet 1  . ranitidine (ZANTAC) 150 MG tablet Take 150 mg by mouth 2 (two) times daily.    . simvastatin (ZOCOR) 5 MG tablet Take 1 tablet (5 mg total) by mouth daily. 5 tablet 3  . temazepam (RESTORIL) 30 MG capsule TAKE 1 CAPSULE BY MOUTH ONCE DAILY AT BEDTIME AS NEEDED FOR SLEEP 30 capsule 0   No current facility-administered medications for this visit.     SURGICAL HISTORY:  Past Surgical History:  Procedure Laterality Date  . BACK SURGERY     cyst removal  . COLONOSCOPY    . LAPAROTOMY N/A 02/25/2017   Procedure: EXPLORATORY LAPAROTOMY MODIFIED GRAHAM'S PATCH;  Surgeon: Kieth Brightly Arta Bruce, MD;  [facility];  Service: General;  Laterality: N/A;  . MINOR PLACEMENT OF FIDUCIAL N/A 01/19/2017   Procedure: MINOR  PLACEMENT OF FIDUCIAL x 6;  Surgeon: Collene Gobble, MD;  Location: [facility];  Service: Thoracic;  Laterality: N/A;  . VIDEO BRONCHOSCOPY WITH ENDOBRONCHIAL NAVIGATION N/A 01/19/2017   Procedure: VIDEO BRONCHOSCOPY WITH ENDOBRONCHIAL NAVIGATION;  Surgeon: Collene Gobble, MD;  Location: [facility];  Service: Thoracic;  Laterality: N/A;    REVIEW OF SYSTEMS:  A comprehensive review of systems was negative except for: Constitutional: positive for fatigue Neurological: positive for paresthesia   PHYSICAL EXAMINATION: General appearance: alert, cooperative and no distress Head: Normocephalic, without obvious  abnormality, atraumatic Neck: no adenopathy, no JVD, supple, symmetrical, trachea midline and thyroid not enlarged, symmetric, no tenderness/mass/nodules Lymph nodes: Cervical, supraclavicular, and axillary nodes normal. Resp: clear to auscultation bilaterally Back: symmetric, no curvature. ROM normal. No CVA tenderness. Cardio: regular rate and rhythm, S1, S2 normal, no murmur, click, rub or gallop GI: soft, non-tender; bowel sounds normal; no masses,  no organomegaly Extremities: extremities normal, atraumatic, no cyanosis or edema  ECOG PERFORMANCE STATUS: 1 - Symptomatic but completely ambulatory   There were no vitals taken for this visit.  LABORATORY DATA: Lab Results  Component Value Date   WBC 9.6 02/16/2018   HGB 13.8 02/16/2018   HCT 41.5 02/16/2018   MCV 88.5 02/16/2018   PLT 257 02/16/2018      Chemistry      Component Value Date/Time   NA 138 02/16/2018 1005   NA 139 10/06/2017 1052   K 4.3 02/16/2018 1005   K 4.2 10/06/2017 1052   CL 104 02/16/2018 1005   CO2 26 02/16/2018 1005   CO2 25 10/06/2017 1052   BUN 14 02/16/2018 1005   BUN 13.2 10/06/2017 1052   CREATININE 0.84 02/16/2018 1005   CREATININE 0.9 10/06/2017 1052      Component Value Date/Time   CALCIUM 9.8 02/16/2018 1005   CALCIUM 9.5 10/06/2017 1052   ALKPHOS 67 02/16/2018 1005   ALKPHOS 68 10/06/2017 1052   AST 15 02/16/2018 1005   AST 14 10/06/2017 1052   ALT 15 02/16/2018 1005   ALT 13 10/06/2017 1052   BILITOT 0.5 02/16/2018 1005   BILITOT 0.44 10/06/2017 1052       RADIOGRAPHIC STUDIES: No results found.  ASSESSMENT AND PLAN:  This is a very pleasant 67 years old white male with stage IV non-small cell lung cancer, squamous cell carcinoma presented with multiple bilateral pulmonary nodules. The patient was treated with systemic chemotherapy with carboplatin and paclitaxel status post 6 cycles. He tolerated the last cycle of his treatment well except for the persistent peripheral  neuropathy. The patient has been in observation for the last 3 months. He had a repeat CT scan of the chest, abdomen and pelvis performed recently.  Has a scan showed mild interval enlargement of left upper lobe as well as right upper lobe pulmonary nodules concerning for disease progression. The patient was a started on treatment with immunotherapy with Nivolumab 480 mg IV every 4 weeks status post 3 cycles.  He is scheduled to have repeat CT scan of the chest, abdomen and pelvis later today for reevaluation of his disease.  I recommended for the patient to proceed with cycle #4 today as a schedule.  I will see him back for follow-up visit in 4 weeks for evaluation before the next cycle of his treatment unless the scan showed any concerning findings for disease progression then I will schedule the patient to come sooner for discussion of further treatment options. The patient was advised to  call immediately if he has any concerning symptoms in the interval. The patient voices understanding of current disease status and treatment options and is in agreement with the current care plan. All questions were answered. The patient knows to call the clinic with any problems, questions or concerns. We can certainly see the patient much sooner if necessary.  Disclaimer: This note was dictated with voice recognition software. Similar sounding words can inadvertently be transcribed and may not be corrected upon review.

## 2018-02-16 NOTE — Patient Instructions (Signed)
[facility] Discharge Instructions for Patients Receiving Chemotherapy  Today you received the following chemotherapy agents:  Opdivo (nivolumab)  To help prevent nausea and vomiting after your treatment, we encourage you to take your nausea medication as prescribed.   If you develop nausea and vomiting that is not controlled by your nausea medication, call the clinic.   BELOW ARE SYMPTOMS THAT SHOULD BE REPORTED IMMEDIATELY:  *FEVER GREATER THAN 100.5 F  *CHILLS WITH OR WITHOUT FEVER  NAUSEA AND VOMITING THAT IS NOT CONTROLLED WITH YOUR NAUSEA MEDICATION  *UNUSUAL SHORTNESS OF BREATH  *UNUSUAL BRUISING OR BLEEDING  TENDERNESS IN MOUTH AND THROAT WITH OR WITHOUT PRESENCE OF ULCERS  *URINARY PROBLEMS  *BOWEL PROBLEMS  UNUSUAL RASH Items with * indicate a potential emergency and should be followed up as soon as possible.  Feel free to call the clinic should you have any questions or concerns. The clinic phone number is [phone].  Please show the [facility] at check-in to the Emergency Department and triage nurse.

## 2018-02-16 NOTE — Telephone Encounter (Signed)
Appointments scheduled AVS/Calender printed per 5/2 los

## 2018-02-16 NOTE — Progress Notes (Signed)
[facility] [place], [place] [address] [phone]   New Patient Evaluation  Date of Service: 02/16/18 Patient Name: [name] [name] Patient MRN: [id number] Patient DOB: [birth year] Provider: Ventura Sellers, MD  Identifying Statement:  [name] [name] is a 67 y.o. male with Drug-induced polyneuropathy ([place]) [G62.0] who presents for initial consultation and evaluation regarding cancer associated neurologic deficits.    Referring Provider: [name], MD [place], [place] [address]  Oncologic History: Oncology History   Patient presented with pneumonia earlier this year and was [place].  F/U CXR showed pulmonary nodules.   Cancer Staging Metastatic squamous cell carcinoma to lung, unspecified laterality [facility]) Staging form: Lung, AJCC 8th Edition - Clinical stage from 01/28/2017: Stage IVA (cT3(m), cN0, cM1a) - Signed by [name], MD on 01/28/2017       Metastatic squamous cell carcinoma to lung, unspecified laterality ([place])   01/03/2017 Imaging    PET IMPRESSION: 1. Dominant irregular hypermetabolic 3.2 cm central right upper lobe lung mass, compatible with primary bronchogenic carcinoma. 2. Three additional irregular hypermetabolic pulmonary nodules in the upper lobes bilaterally as described, which could represent synchronous primary bronchogenic carcinomas and/or pulmonary metastases. Subcentimeter left lower lobe pulmonary nodule, below PET resolution, metastasis not excluded.      01/19/2017 Surgery    Operation: Flexible video fiberoptic bronchoscopy with electromagnetic navigation and biopsies      01/28/2017 Initial Diagnosis    Metastatic squamous cell carcinoma to lung, unspecified laterality ([place])      02/09/2017 -  Chemotherapy    The patient had palonosetron (ALOXI) injection 0.25 mg, 0.25 mg, Intravenous,  Once, 1 of 6 cycles  pegfilgrastim (NEULASTA ONPRO KIT) injection 6 mg, 6 mg, Subcutaneous,  Once, 1 of 6 cycles  CARBOplatin (PARAPLATIN) 600 mg in sodium chloride 0.9 % 250 mL chemo infusion, 600 mg (100 % of original dose 597 mg), Intravenous,  Once, 1 of 6 cycles Dose modification: 597 mg (original dose 597 mg, Cycle 1)  PACLitaxel (TAXOL) 432 mg in dextrose 5 % 500 mL chemo infusion (> 16m/m2), 200 mg/m2 = 432 mg, Intravenous,  Once, 1 of 6 cycles  for chemotherapy treatment.  1st chemo        History of Present Illness: The patient's records from the referring physician were obtained and reviewed and the patient interviewed to confirm this HPI.  [name] [name] today at the request of Dr. MJulien Nordmannfor focused examination and consideration of patient's polyneuropathy.  [name] [name] "constant tingling and burning pain sensation" affecting both feet up to the ankles, as well as distal fingertips in both hands.  These symptoms started ~1 year ago during chemotherapy, continued to progress somewhat while on treatment.  When chemo was stopped, the sensations persisted but did not continue to worsen.  He feels the impaired sensation in his feet interfere with his ability to walk confidently.  Tingling in fingertips affects manipulation of his hands/fingers for fine motor tasks.  He takes Gabapentin 3087mfour times per day, but does not feel that this has helped him. He also describes poor sleep for which he takes several medications.    Medications: Current Outpatient Medications on File Prior to Visit  Medication Sig Dispense Refill  . acetaminophen (TYLENOL) 325 MG tablet You can take 2 every 4 hours as needed.  You can buy it over the counter DO NOT TAKE MORE THAN 4000 MG OF TYLENOL PER DAY.  IT CAN HARM YOUR LIVER.    Marland Kitchen aspirin EC 81 MG tablet Take 81 mg by mouth daily.    Marland Kitchen gabapentin (NEURONTIN) 300 MG capsule Take 1 capsule (300 mg total) by mouth 4 (four) times daily. 90 capsule 1  . hydrochlorothiazide (HYDRODIURIL) 25 MG tablet Restart this after you see your  Primary care doctor.  Check your blood pressure at home and record.    Marland Kitchen lisinopril (PRINIVIL,ZESTRIL) 40 MG tablet Take 40 mg by mouth daily.    . metoprolol succinate (TOPROL-XL) 50 MG 24 hr tablet 50 mg.  0  . mirtazapine (REMERON) 30 MG tablet Take 1 tablet (30 mg total) by mouth at bedtime. 30 tablet 1  . pantoprazole (PROTONIX) 40 MG tablet Take 1 tablet (40 mg total) by mouth 2 (two) times daily. 60 tablet 1  . ranitidine (ZANTAC) 150 MG tablet Take 150 mg by mouth 2 (two) times daily.    . simvastatin (ZOCOR) 5 MG tablet Take 1 tablet (5 mg total) by mouth daily. 5 tablet 3  . temazepam (RESTORIL) 30 MG capsule TAKE 1 CAPSULE BY MOUTH ONCE DAILY AT BEDTIME AS NEEDED FOR SLEEP 30 capsule 0   No current facility-administered medications on file prior to visit.     Allergies:  Allergies  Allergen Reactions  . No Known Allergies    Past Medical History:  Past Medical History:  Diagnosis Date  . Brain metastases ([place]) 02/15/2017  . Depression 01/29/2017  . Dyspnea   . Encounter for antineoplastic chemotherapy 01/29/2017  . Goals of care, counseling/discussion 01/29/2017  . Hypertension   . Metastatic squamous cell carcinoma to lung, unspecified laterality ([place]) 01/28/2017  . Stroke [facility])    Past Surgical History:  Past Surgical History:  Procedure Laterality Date  . BACK SURGERY     cyst removal  . COLONOSCOPY    . LAPAROTOMY N/A 02/25/2017   Procedure: EXPLORATORY LAPAROTOMY MODIFIED GRAHAM'S PATCH;  Surgeon: Kinsinger, Arta Bruce, MD;  Location: [facility];  Service: General;  Laterality: N/A;  . MINOR PLACEMENT OF FIDUCIAL N/A 01/19/2017   Procedure: MINOR PLACEMENT OF FIDUCIAL x 6;  Surgeon: Collene Gobble, MD;  Location: [place];  Service: Thoracic;  Laterality: N/A;  . VIDEO BRONCHOSCOPY WITH ENDOBRONCHIAL NAVIGATION N/A 01/19/2017   Procedure: VIDEO BRONCHOSCOPY WITH ENDOBRONCHIAL NAVIGATION;  Surgeon: Collene Gobble, MD;  Location: [place];  Service: Thoracic;  Laterality: N/A;    Social History:  Social History   Socioeconomic History  . Marital status: Single    Spouse name: Not on file  . Number of children: 0  . Years of education: 10  . Highest education level: Not on file  Occupational History    Comment: NA  Social Needs  . Financial resource strain: Not on file  . Food insecurity:    Worry: Not on file    Inability: Not on file  . Transportation needs:    Medical: Not on file    Non-medical: Not on file  Tobacco Use  . Smoking status: Current Every Day Smoker    Packs/day: 0.50    Years: 45.00    Pack years: 22.50    Types: Cigarettes  . Smokeless tobacco: Never Used  Substance and Sexual Activity  . Alcohol use: No  . Drug use: Yes    Types: Marijuana    Comment: daily  . Sexual activity: Not on file  Lifestyle  . Physical activity:    Days per week: Not on file  Minutes per session: Not on file  . Stress: Not on file  Relationships  . Social connections:    Talks on phone: Not on file    Gets together: Not on file    Attends religious service: Not on file    Active member of club or organization: Not on file    Attends meetings of clubs or organizations: Not on file    Relationship status: Not on file  . Intimate partner violence:    Fear of current or ex partner: Not on file    Emotionally abused: Not on file    Physically abused: Not on file    Forced sexual activity: Not on file  Other Topics Concern  . Not on file  Social History Narrative   Lives alone   Caffeine- coffee, 5 cups daily   Family History:  Family History  Problem Relation Age of Onset  . Cancer Father        unsure of what type    Review of Systems: Constitutional: Denies fevers, chills or abnormal weight loss Eyes: Denies blurriness of vision Ears, nose, mouth, throat, and face: Denies mucositis or sore throat Respiratory: Denies cough, dyspnea or wheezes Cardiovascular: Denies palpitation, chest discomfort or lower extremity  swelling Gastrointestinal:  Denies nausea, constipation, diarrhea GU: Denies dysuria or incontinence Skin: Denies abnormal skin rashes Neurological: Per HPI Musculoskeletal: Denies joint pain, back or neck discomfort. No decrease in ROM Behavioral/Psych: Denies anxiety, disturbance in thought content, and mood instability   Physical Exam: Vitals:   02/16/18 0922  BP: (!) 143/75  Pulse: 64  Resp: 17  Temp: 98.5 F (36.9 C)  SpO2: 94%   KPS: 90. General: Alert, cooperative, pleasant, in no acute distress Head: Normal EENT: No conjunctival injection or scleral icterus. Oral mucosa moist Lungs: Resp effort normal Cardiac: Regular rate and rhythm Abdomen: Soft, non-distended abdomen Skin: No rashes cyanosis or petechiae. Extremities: No clubbing or edema  Neurologic Exam: Mental Status: Awake, alert, attentive to examiner. Oriented to self and environment. Language is fluent with intact comprehension.  Cranial Nerves: Visual acuity is grossly normal. Visual fields are full. Extra-ocular movements intact. No ptosis. Face is symmetric, tongue midline. Motor: Tone and bulk are normal. Power is full in both arms and legs. Reflexes are trace throughout, no pathologic reflexes present. Intact finger to nose bilaterally Sensory: Impaired to temperature distal legs below mid-shins.  Propioception is intact.  Gait: Normal   Labs: I have reviewed the data as listed    Component Value Date/Time   NA 138 02/16/2018 1005   NA 139 10/06/2017 1052   K 4.3 02/16/2018 1005   K 4.2 10/06/2017 1052   CL 104 02/16/2018 1005   CO2 26 02/16/2018 1005   CO2 25 10/06/2017 1052   GLUCOSE 101 02/16/2018 1005   GLUCOSE 107 10/06/2017 1052   BUN 14 02/16/2018 1005   BUN 13.2 10/06/2017 1052   CREATININE 0.84 02/16/2018 1005   CREATININE 0.9 10/06/2017 1052   CALCIUM 9.8 02/16/2018 1005   CALCIUM 9.5 10/06/2017 1052   PROT 7.5 02/16/2018 1005   PROT 7.4 10/06/2017 1052   ALBUMIN 3.9  02/16/2018 1005   ALBUMIN 4.3 10/06/2017 1052   AST 15 02/16/2018 1005   AST 14 10/06/2017 1052   ALT 15 02/16/2018 1005   ALT 13 10/06/2017 1052   ALKPHOS 67 02/16/2018 1005   ALKPHOS 68 10/06/2017 1052   BILITOT 0.5 02/16/2018 1005   BILITOT 0.44 10/06/2017 1052   GFRNONAA >  60 02/16/2018 1005   GFRAA >60 02/16/2018 1005   Lab Results  Component Value Date   WBC 9.6 02/16/2018   NEUTROABS 4.4 02/16/2018   HGB 13.8 02/16/2018   HCT 41.5 02/16/2018   MCV 88.5 02/16/2018   PLT 257 02/16/2018     Assessment/Plan 1. Drug-induced polyneuropathy [facility])  [name] [name] polyneuropathy has stabilized but it continues to have adverse impact on quality of life and daily functioning.  His daily dose of Gabapentin of 1226m has been ineffective.    We reviewed first line treatment options for treatment of neuropathic pain.  Our recommendation at this time is to initiate therapy with Amytryptilline starting at 511mHS.  This would be an antidepressant which would replace Remeron.  Amytriptylline causes drowsiness, like Remeron, but is also first line for neuropathic pain unlike Remeron.   We discussed potential side effects including dry mouth and constipation.   We spent ten additional minutes teaching regarding the natural history, biology, and historical experience in the treatment of neurologic complications of cancer. We also provided teaching sheets for the patient to take home as an additional resource.  For now, should also continue Gabapentin at present dose. He should return to clinic for re-evaluation the same day as his next scheduled oncology appointment.  We appreciate the opportunity to participate in the care of [name] [name]  All questions were answered. The patient knows to call the clinic with any problems, questions or concerns. No barriers to learning were detected.  The total time spent in the encounter was 40 minutes and more than 50% was on counseling and review of test  results   ZaVentura SellersMD Medical Director of Neuro-Oncology [facility]5/02/19 1:54 PM

## 2018-02-17 ENCOUNTER — Telehealth: Payer: Self-pay

## 2018-02-17 NOTE — Telephone Encounter (Signed)
Received a voicemail from patient with a question about a medication Dr. Mickeal Skinner prescribed yesterday. Returned call to patient.

## 2018-02-28 ENCOUNTER — Telehealth: Payer: Self-pay | Admitting: Medical Oncology

## 2018-02-28 ENCOUNTER — Encounter: Payer: Self-pay | Admitting: Pharmacy Technician

## 2018-02-28 NOTE — Progress Notes (Unsigned)
The patient is approved for Medicare effective 01/16/18.  Opdivo drug replacement was received on 02/08/18 for DOS 02/16/18. Billing to Medicare for this drug should be after this DOS.

## 2018-02-28 NOTE — Telephone Encounter (Signed)
askign for ct scan results. Next appt 03/16/18

## 2018-02-28 NOTE — Telephone Encounter (Signed)
Few mild changes. Will discuss at next visit

## 2018-03-01 ENCOUNTER — Telehealth: Payer: Self-pay | Admitting: Medical Oncology

## 2018-03-01 NOTE — Telephone Encounter (Signed)
[name], MD  You; Lucile Crater, RN 13 hours ago (6:12 PM)      Few mild changes. Will discuss at next visit     LM on pt VM.

## 2018-03-06 ENCOUNTER — Other Ambulatory Visit: Payer: BLUE CROSS/BLUE SHIELD

## 2018-03-06 ENCOUNTER — Ambulatory Visit: Payer: BLUE CROSS/BLUE SHIELD

## 2018-03-06 ENCOUNTER — Ambulatory Visit: Payer: BLUE CROSS/BLUE SHIELD | Admitting: Internal Medicine

## 2018-03-07 ENCOUNTER — Encounter: Payer: Self-pay | Admitting: Family

## 2018-03-07 ENCOUNTER — Ambulatory Visit (INDEPENDENT_AMBULATORY_CARE_PROVIDER_SITE_OTHER): Payer: Medicare Other | Admitting: Family

## 2018-03-07 ENCOUNTER — Ambulatory Visit (HOSPITAL_COMMUNITY)
Admission: RE | Admit: 2018-03-07 | Discharge: 2018-03-07 | Disposition: A | Discharge status: Home / Self Care | Payer: Medicare Other | Source: Ambulatory Visit | Attending: Surgery | Admitting: Surgery

## 2018-03-07 VITALS — BP 122/79 | HR 69 | Temp 97.3°F | Resp 16 | Ht 69.0 in | Wt 213.0 lb

## 2018-03-07 DIAGNOSIS — Z87891 Personal history of nicotine dependence: Secondary | ICD-10-CM | POA: Diagnosis not present

## 2018-03-07 DIAGNOSIS — I6523 Occlusion and stenosis of bilateral carotid arteries: Secondary | ICD-10-CM | POA: Diagnosis not present

## 2018-03-07 DIAGNOSIS — I10 Essential (primary) hypertension: Secondary | ICD-10-CM | POA: Insufficient documentation

## 2018-03-07 DIAGNOSIS — F17200 Nicotine dependence, unspecified, uncomplicated: Secondary | ICD-10-CM | POA: Insufficient documentation

## 2018-03-07 DIAGNOSIS — I6521 Occlusion and stenosis of right carotid artery: Secondary | ICD-10-CM | POA: Diagnosis not present

## 2018-03-07 DIAGNOSIS — I6522 Occlusion and stenosis of left carotid artery: Secondary | ICD-10-CM | POA: Diagnosis not present

## 2018-03-07 NOTE — Patient Instructions (Addendum)

## 2018-03-07 NOTE — Progress Notes (Signed)
Chief Complaint: Follow up Extracranial Carotid Artery Stenosis   History of Present Illness  [name] [name] is a 67 y.o. male who was referred to Dr. Trula Slade for evaluation of carotid occlusive disease.  Approximately 2 months before his initial visit on 07-18-17, the patient developed vision changes in his right eye.  This lasted for approximately a few hours.  He was found to have an occluded right carotid artery.  The left side was in the 1-50 percent range.  The patient told Dr. Trula Slade that he had a ultrasound earlier in the year that showed only mild stenosis bilaterally.  The patient did not have any other symptoms.  At pt's 07-18-17 visit pt has an occluded right carotid artery in the setting of approximately 50% left carotid stenosis which is asymptomatic: Dr. Trula Slade explained to the patient that his asymptomatic left carotid stenosis will be treated with maximal medical treatment up until it becomes greater than 80%, at which time we would consider endarterectomy or stenting. Dr. Trula Slade started a statin to his medical regimen.  He has recently changed primary care physicians that he does not have the name of his position. Dr. Trula Slade advised follow-up in 6 months with a repeat carotid duplex.  Patient is currently being treated for brain metastasis secondary to squamous cell lung cancer, stage 4 per pt.   Pt states he has neuropathy in his feet since he has been on chemo.   Diabetic: no Tobacco use: former smoker, quit January 2019, started in his teens  Pt meds include: Statin : yes ASA: yes Other anticoagulants/antiplatelets: no   Past Medical History:  Diagnosis Date  . Brain metastases ([place]) 02/15/2017  . Depression 01/29/2017  . Dyspnea   . Encounter for antineoplastic chemotherapy 01/29/2017  . Goals of care, counseling/discussion 01/29/2017  . Hypertension   . Metastatic squamous cell carcinoma to lung, unspecified laterality ([place]) 01/28/2017  . Stroke [facility])     Social  History Social History   Tobacco Use  . Smoking status: Current Every Day Smoker    Packs/day: 0.50    Years: 45.00    Pack years: 22.50    Types: Cigarettes  . Smokeless tobacco: Never Used  Substance Use Topics  . Alcohol use: No  . Drug use: Yes    Types: Marijuana    Comment: daily    Family History Family History  Problem Relation Age of Onset  . Cancer Father        unsure of what type    Surgical History Past Surgical History:  Procedure Laterality Date  . BACK SURGERY     cyst removal  . COLONOSCOPY    . LAPAROTOMY N/A 02/25/2017   Procedure: EXPLORATORY LAPAROTOMY MODIFIED GRAHAM'S PATCH;  Surgeon: Kinsinger, Arta Bruce, MD;  [facility];  Service: General;  Laterality: N/A;  . MINOR PLACEMENT OF FIDUCIAL N/A 01/19/2017   Procedure: MINOR PLACEMENT OF FIDUCIAL x 6;  Surgeon: Collene Gobble, MD;  Location: [place];  Service: Thoracic;  Laterality: N/A;  . VIDEO BRONCHOSCOPY WITH ENDOBRONCHIAL NAVIGATION N/A 01/19/2017   Procedure: VIDEO BRONCHOSCOPY WITH ENDOBRONCHIAL NAVIGATION;  Surgeon: Collene Gobble, MD;  Location: [place];  Service: Thoracic;  Laterality: N/A;    Allergies  Allergen Reactions  . No Known Allergies     Current Outpatient Medications  Medication Sig Dispense Refill  . acetaminophen (TYLENOL) 325 MG tablet You can take 2 every 4 hours as needed.  You can buy it over the counter  DO NOT TAKE MORE THAN 4000 MG OF TYLENOL PER DAY.  IT CAN HARM YOUR LIVER.    Marland Kitchen amitriptyline (ELAVIL) 50 MG tablet Take 1 tablet (50 mg total) by mouth at bedtime. 30 tablet 3  . aspirin EC 81 MG tablet Take 81 mg by mouth daily.    Marland Kitchen gabapentin (NEURONTIN) 300 MG capsule Take 1 capsule (300 mg total) by mouth 4 (four) times daily. 90 capsule 1  . hydrochlorothiazide (HYDRODIURIL) 25 MG tablet Restart this after you see your Primary care doctor.  Check your blood pressure at home and record.    Marland Kitchen lisinopril (PRINIVIL,ZESTRIL) 40 MG tablet Take 40 mg by mouth daily.     . metoprolol succinate (TOPROL-XL) 50 MG 24 hr tablet 50 mg.  0  . mirtazapine (REMERON) 30 MG tablet Take 1 tablet (30 mg total) by mouth at bedtime. 30 tablet 1  . pantoprazole (PROTONIX) 40 MG tablet Take 1 tablet (40 mg total) by mouth 2 (two) times daily. 60 tablet 1  . ranitidine (ZANTAC) 150 MG tablet Take 150 mg by mouth 2 (two) times daily.    . simvastatin (ZOCOR) 5 MG tablet Take 1 tablet (5 mg total) by mouth daily. 5 tablet 3  . temazepam (RESTORIL) 30 MG capsule TAKE 1 CAPSULE BY MOUTH ONCE DAILY AT BEDTIME AS NEEDED FOR SLEEP 30 capsule 0   No current facility-administered medications for this visit.     Review of Systems : See HPI for pertinent positives and negatives.  Physical Examination  Vitals:   03/07/18 1533 03/07/18 1535  BP: 125/82 122/79  Pulse: 69   Resp: 16   Temp: (!) 97.3 F (36.3 C)   TempSrc: Oral   SpO2: 93%   Weight: 213 lb (96.6 kg)   Height: 5\' 9"  (1.753 m)    Body mass index is 31.45 kg/m.  General: WDWN obese male in NAD GAIT: normal Eyes: PERRLA HENT: No gross abnormalities.  Pulmonary: Respirations are non-labored, limited  air movement in bilateral posterior fields, CTAB, no rales,  rhonchi, or wheezing. Cardiac: regular rhythm, no detected murmur.  VASCULAR EXAM Carotid Bruits Right Left   Negative Negative     Abdominal aortic pulse is not palpable. Radial pulses are 2+ palpable and equal.                                                                                                                            LE Pulses Right Left       POPLITEAL  not palpable   not palpable       POSTERIOR TIBIAL  2+ palpable   2+ palpable        DORSALIS PEDIS      ANTERIOR TIBIAL 2+ palpable  2+ palpable     Gastrointestinal: soft, nontender, BS WNL, no r/g, no palpable masses. Musculoskeletal: no muscle atrophy/wasting. M/S 5/5 throughout, extremities without ischemic changes. Skin: No rashes, no ulcers, no cellulitis.  Neurologic:  A&O X 3; appropriate affect, sensation is normal; speech is normal, CN 2-12 intact, pain and light touch intact in extremities, motor exam as listed above. Psychiatric: Normal thought content, mood appropriate to clinical situation.    Assessment: [name] [name] is a 67 y.o. male who had a right ocular stroke about August 2018, his vision has returned to normal within a few hours. He has not had any subsequent stroke or TIA.   He has lung cancer with mets to his brain, he is functioning well.   He stopped smoking about January 2019. He does not have DM. He takes a daily ASA and a statin.   DATA Carotid Duplex (03/07/18): Right ICA: confirmed occlusion Left ICA: 40-59% stenosis Bilateral vertebral artery flow is antegrade.  Bilateral subclavian artery waveforms are normal.  No change since the exam on 07-18-17.     Plan: Follow-up in 6 months with Carotid Duplex scan.   I discussed in depth with the patient the nature of atherosclerosis, and emphasized the importance of maximal medical management including strict control of blood pressure, blood glucose, and lipid levels, obtaining regular exercise, and continued cessation of smoking.  The patient is aware that without maximal medical management the underlying atherosclerotic disease process will progress, limiting the benefit of any interventions. The patient was given information about stroke prevention and what symptoms should prompt the patient to seek immediate medical care. Thank you for allowing [place] to participate in this patient's care.  Clemon Chambers, RN, MSN, FNP-C [facility] Office: [phone]  Clinic Physician: Early  03/07/18 4:02 PM

## 2018-03-14 ENCOUNTER — Other Ambulatory Visit: Payer: Self-pay | Admitting: Medical Oncology

## 2018-03-14 ENCOUNTER — Telehealth: Payer: Self-pay | Admitting: Medical Oncology

## 2018-03-14 ENCOUNTER — Other Ambulatory Visit: Payer: Self-pay | Admitting: Internal Medicine

## 2018-03-14 DIAGNOSIS — C7800 Secondary malignant neoplasm of unspecified lung: Secondary | ICD-10-CM

## 2018-03-14 DIAGNOSIS — G4700 Insomnia, unspecified: Secondary | ICD-10-CM

## 2018-03-14 MED ORDER — TEMAZEPAM 30 MG PO CAPS: ORAL_CAPSULE | ORAL | 0 refills | Status: DC

## 2018-03-14 NOTE — Telephone Encounter (Signed)
Temazepam refill called to pharmacy.

## 2018-03-16 ENCOUNTER — Inpatient Hospital Stay: Payer: Medicare Other

## 2018-03-16 ENCOUNTER — Telehealth: Payer: Self-pay | Admitting: Internal Medicine

## 2018-03-16 ENCOUNTER — Encounter: Payer: Self-pay | Admitting: Oncology

## 2018-03-16 ENCOUNTER — Inpatient Hospital Stay: Payer: Medicare Other | Admitting: Oncology

## 2018-03-16 ENCOUNTER — Encounter: Payer: Self-pay | Admitting: Internal Medicine

## 2018-03-16 ENCOUNTER — Inpatient Hospital Stay (HOSPITAL_BASED_OUTPATIENT_CLINIC_OR_DEPARTMENT_OTHER): Payer: Medicare Other | Admitting: Internal Medicine

## 2018-03-16 VITALS — BP 116/100 | HR 76 | Temp 98.1°F | Resp 18 | Ht 69.0 in | Wt 212.7 lb

## 2018-03-16 DIAGNOSIS — C3490 Malignant neoplasm of unspecified part of unspecified bronchus or lung: Secondary | ICD-10-CM

## 2018-03-16 DIAGNOSIS — Z79899 Other long term (current) drug therapy: Secondary | ICD-10-CM | POA: Diagnosis not present

## 2018-03-16 DIAGNOSIS — G620 Drug-induced polyneuropathy: Secondary | ICD-10-CM

## 2018-03-16 DIAGNOSIS — C7931 Secondary malignant neoplasm of brain: Secondary | ICD-10-CM | POA: Diagnosis not present

## 2018-03-16 DIAGNOSIS — C7800 Secondary malignant neoplasm of unspecified lung: Secondary | ICD-10-CM

## 2018-03-16 DIAGNOSIS — Z5112 Encounter for antineoplastic immunotherapy: Secondary | ICD-10-CM | POA: Diagnosis not present

## 2018-03-16 DIAGNOSIS — Z5111 Encounter for antineoplastic chemotherapy: Secondary | ICD-10-CM

## 2018-03-16 LAB — CMP (CANCER CENTER ONLY)
ALT: 15 U/L (ref 0–55)
ANION GAP: 8 (ref 3–11)
AST: 15 U/L (ref 5–34)
Albumin: 3.8 g/dL (ref 3.5–5.0)
Alkaline Phosphatase: 67 U/L (ref 40–150)
BUN: 19 mg/dL (ref 7–26)
CALCIUM: 9.3 mg/dL (ref 8.4–10.4)
CHLORIDE: 103 mmol/L (ref 98–109)
CO2: 25 mmol/L (ref 22–29)
CREATININE: 0.93 mg/dL (ref 0.70–1.30)
GFR, Estimated: >60 mL/min (ref >60)
Glucose, Bld: 105 mg/dL (ref 70–140)
Potassium: 4.9 mmol/L (ref 3.5–5.1)
SODIUM: 136 mmol/L (ref 136–145)
Total Bilirubin: 0.3 mg/dL (ref 0.2–1.2)
Total Protein: 7.6 g/dL (ref 6.4–8.3)

## 2018-03-16 LAB — CBC WITH DIFFERENTIAL (CANCER CENTER ONLY)
BASOS PCT: 1 %
Basophils Absolute: 0.1 10*3/uL (ref 0.0–0.1)
EOS ABS: 0.2 10*3/uL (ref 0.0–0.5)
Eosinophils Relative: 2 %
HCT: 43.2 % (ref 38.4–49.9)
HEMOGLOBIN: 14.5 g/dL (ref 13.0–17.1)
LYMPHS ABS: 3.6 10*3/uL — AB (ref 0.9–3.3)
Lymphocytes Relative: 35 %
MCH: 29.8 pg (ref 27.2–33.4)
MCHC: 33.6 g/dL (ref 32.0–36.0)
MCV: 88.7 fL (ref 79.3–98.0)
MONOS PCT: 8 %
Monocytes Absolute: 0.8 10*3/uL (ref 0.1–0.9)
NEUTROS ABS: 5.6 10*3/uL (ref 1.5–6.5)
NEUTROS PCT: 54 %
Platelet Count: 253 10*3/uL (ref 140–400)
RBC: 4.87 MIL/uL (ref 4.20–5.82)
RDW: 14.2 % (ref 11.0–14.6)
WBC: 10.2 10*3/uL (ref 4.0–10.3)

## 2018-03-16 LAB — TSH

## 2018-03-16 MED ORDER — GABAPENTIN 300 MG PO CAPS: 600.0000 mg | ORAL_CAPSULE | Freq: Three times a day (TID) | ORAL | 2 refills | Status: DC

## 2018-03-16 MED ORDER — SODIUM CHLORIDE 0.9 % IV SOLN
Freq: Once | INTRAVENOUS | Status: AC
  Administered 2018-03-16: 12:00:00 via INTRAVENOUS

## 2018-03-16 MED ORDER — SODIUM CHLORIDE 0.9 % IV SOLN
480.0000 mg | Freq: Once | INTRAVENOUS | Status: AC
  Administered 2018-03-16: 480 mg via INTRAVENOUS
  Filled 2018-03-16: qty 48
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
## 2018-03-16 NOTE — Progress Notes (Signed)
Patient came in today to sign [facility] approval and received a gas card. Copy of approval letter as well as expense sheet along with our outpatient pharmacy was given. Patient would like to reserve these funds for transportation due to distance traveling. He has my card for any additional financial questions or concerns.

## 2018-03-16 NOTE — Telephone Encounter (Signed)
Scheduled appt pe 5/30 los - pt to get an updated schedule next visit.

## 2018-03-16 NOTE — Patient Instructions (Signed)
[facility] Discharge Instructions for Patients Receiving Chemotherapy  Today you received the following chemotherapy agents:  Opdivo (nivolumab)  To help prevent nausea and vomiting after your treatment, we encourage you to take your nausea medication as prescribed.   If you develop nausea and vomiting that is not controlled by your nausea medication, call the clinic.   BELOW ARE SYMPTOMS THAT SHOULD BE REPORTED IMMEDIATELY:  *FEVER GREATER THAN 100.5 F  *CHILLS WITH OR WITHOUT FEVER  NAUSEA AND VOMITING THAT IS NOT CONTROLLED WITH YOUR NAUSEA MEDICATION  *UNUSUAL SHORTNESS OF BREATH  *UNUSUAL BRUISING OR BLEEDING  TENDERNESS IN MOUTH AND THROAT WITH OR WITHOUT PRESENCE OF ULCERS  *URINARY PROBLEMS  *BOWEL PROBLEMS  UNUSUAL RASH Items with * indicate a potential emergency and should be followed up as soon as possible.  Feel free to call the clinic should you have any questions or concerns. The clinic phone number is [phone].  Please show the [facility] at check-in to the Emergency Department and triage nurse.

## 2018-03-16 NOTE — Assessment & Plan Note (Signed)
This is a very pleasant 67 year old white male with stage IV non-small cell lung cancer, squamous cell carcinoma presented with multiple bilateral pulmonary nodules. The patient was treated with systemic chemotherapy with carboplatin and paclitaxel status post 6 cycles. He tolerated the last cycle of his treatment well except for the persistent peripheral neuropathy. The patient was then on observation for 3 months. Repeatt CT scan of the chest, abdomen and pelvis showed mild interval enlargement of left upper lobe as well as right upper lobe pulmonary nodules concerning for disease progression. The patient was a started on treatment with immunotherapy with Nivolumab 480 mg IV every 4 weeks status post 4 cycles.   He had a restaging CT scan of the chest, abdomen, pelvis performed on the day of his last visit and is here to discuss the results.  Patient was seen with Dr. Julien Nordmann.  CT scan results were discussed with the patient which showed no evidence of disease progression.  Recommend that he continue on the current treatment with Nivolumab every 4 weeks. The patient will proceed with cycle 5 of his treatment as scheduled today. He will follow-up in 4 weeks for evaluation prior to cycle #6.  The patient was advised to call immediately if he has any concerning symptoms in the interval. The patient voices understanding of current disease status and treatment options and is in agreement with the current care plan. All questions were answered. The patient knows to call the clinic with any problems, questions or concerns. We can certainly see the patient much sooner if necessary.

## 2018-03-30 ENCOUNTER — Other Ambulatory Visit: Payer: Self-pay

## 2018-03-30 DIAGNOSIS — I6523 Occlusion and stenosis of bilateral carotid arteries: Secondary | ICD-10-CM

## 2018-04-13 ENCOUNTER — Inpatient Hospital Stay (HOSPITAL_BASED_OUTPATIENT_CLINIC_OR_DEPARTMENT_OTHER): Payer: Medicare Other | Admitting: Internal Medicine

## 2018-04-13 ENCOUNTER — Encounter: Payer: Self-pay | Admitting: Internal Medicine

## 2018-04-13 ENCOUNTER — Inpatient Hospital Stay: Payer: Medicare Other | Attending: Internal Medicine

## 2018-04-13 ENCOUNTER — Telehealth: Payer: Self-pay | Admitting: Internal Medicine

## 2018-04-13 ENCOUNTER — Inpatient Hospital Stay: Payer: Medicare Other

## 2018-04-13 DIAGNOSIS — C7800 Secondary malignant neoplasm of unspecified lung: Secondary | ICD-10-CM

## 2018-04-13 DIAGNOSIS — G629 Polyneuropathy, unspecified: Secondary | ICD-10-CM | POA: Insufficient documentation

## 2018-04-13 DIAGNOSIS — C7931 Secondary malignant neoplasm of brain: Secondary | ICD-10-CM

## 2018-04-13 DIAGNOSIS — Z79899 Other long term (current) drug therapy: Secondary | ICD-10-CM | POA: Diagnosis not present

## 2018-04-13 DIAGNOSIS — Z5112 Encounter for antineoplastic immunotherapy: Secondary | ICD-10-CM | POA: Diagnosis not present

## 2018-04-13 DIAGNOSIS — C3490 Malignant neoplasm of unspecified part of unspecified bronchus or lung: Secondary | ICD-10-CM

## 2018-04-13 DIAGNOSIS — Z5111 Encounter for antineoplastic chemotherapy: Secondary | ICD-10-CM

## 2018-04-13 LAB — CMP (CANCER CENTER ONLY)
ALBUMIN: 3.4 g/dL — AB (ref 3.5–5.0)
ALT: 17 U/L (ref 0–44)
ANION GAP: 6 (ref 5–15)
AST: 13 U/L — ABNORMAL LOW (ref 15–41)
Alkaline Phosphatase: 71 U/L (ref 38–126)
BILIRUBIN TOTAL: 0.4 mg/dL (ref 0.3–1.2)
BUN: 16 mg/dL (ref 8–23)
CHLORIDE: 102 mmol/L (ref 98–111)
CO2: 28 mmol/L (ref 22–32)
Calcium: 9.4 mg/dL (ref 8.9–10.3)
Creatinine: 0.81 mg/dL (ref 0.61–1.24)
GFR, Est AFR Am: >60 mL/min (ref >60)
GFR, Estimated: >60 mL/min (ref >60)
GLUCOSE: 97 mg/dL (ref 70–99)
POTASSIUM: 4.3 mmol/L (ref 3.5–5.1)
SODIUM: 136 mmol/L (ref 135–145)
TOTAL PROTEIN: 7.1 g/dL (ref 6.5–8.1)

## 2018-04-13 LAB — CBC WITH DIFFERENTIAL (CANCER CENTER ONLY)
BASOS PCT: 1 %
Basophils Absolute: 0.1 10*3/uL (ref 0.0–0.1)
Eosinophils Absolute: 0.3 10*3/uL (ref 0.0–0.5)
Eosinophils Relative: 3 %
HEMATOCRIT: 41.7 % (ref 38.4–49.9)
Hemoglobin: 13.8 g/dL (ref 13.0–17.1)
LYMPHS ABS: 3.7 10*3/uL — AB (ref 0.9–3.3)
Lymphocytes Relative: 38 %
MCH: 28.7 pg (ref 27.2–33.4)
MCHC: 33.1 g/dL (ref 32.0–36.0)
MCV: 86.8 fL (ref 79.3–98.0)
MONO ABS: 1 10*3/uL — AB (ref 0.1–0.9)
Monocytes Relative: 10 %
NEUTROS ABS: 4.7 10*3/uL (ref 1.5–6.5)
Neutrophils Relative %: 48 %
Platelet Count: 256 10*3/uL (ref 140–400)
RBC: 4.81 MIL/uL (ref 4.20–5.82)
RDW: 13.4 % (ref 11.0–14.6)
WBC Count: 9.7 10*3/uL (ref 4.0–10.3)

## 2018-04-13 LAB — TSH: TSH: <0.08 u[IU]/mL — ABNORMAL LOW (ref 0.320–4.118)

## 2018-04-13 MED ORDER — SODIUM CHLORIDE 0.9 % IV SOLN
Freq: Once | INTRAVENOUS | Status: AC
  Administered 2018-04-13: 14:00:00 via INTRAVENOUS

## 2018-04-13 MED ORDER — GABAPENTIN 300 MG PO CAPS: 600.0000 mg | ORAL_CAPSULE | Freq: Three times a day (TID) | ORAL | 2 refills | Status: DC

## 2018-04-13 MED ORDER — SODIUM CHLORIDE 0.9 % IV SOLN
480.0000 mg | Freq: Once | INTRAVENOUS | Status: AC
  Administered 2018-04-13: 480 mg via INTRAVENOUS
  Filled 2018-04-13: qty 48

## 2018-04-13 NOTE — Telephone Encounter (Signed)
Appointments scheduled AVS/Calendar printed/ contrast with instructions provided per 6/27 los

## 2018-04-13 NOTE — Patient Instructions (Signed)
[facility] Discharge Instructions for Patients Receiving Chemotherapy  Today you received the following chemotherapy agents: Nivolumab  To help prevent nausea and vomiting after your treatment, we encourage you to take your nausea medication as directed.    If you develop nausea and vomiting that is not controlled by your nausea medication, call the clinic.   BELOW ARE SYMPTOMS THAT SHOULD BE REPORTED IMMEDIATELY:  *FEVER GREATER THAN 100.5 F  *CHILLS WITH OR WITHOUT FEVER  NAUSEA AND VOMITING THAT IS NOT CONTROLLED WITH YOUR NAUSEA MEDICATION  *UNUSUAL SHORTNESS OF BREATH  *UNUSUAL BRUISING OR BLEEDING  TENDERNESS IN MOUTH AND THROAT WITH OR WITHOUT PRESENCE OF ULCERS  *URINARY PROBLEMS  *BOWEL PROBLEMS  UNUSUAL RASH Items with * indicate a potential emergency and should be followed up as soon as possible.  Feel free to call the clinic should you have any questions or concerns. The clinic phone number is [phone].  Please show the CHEMO ALERT CARD at check-in to the Emergency Department and triage nurse.   

## 2018-04-13 NOTE — Progress Notes (Signed)
[place] [phone]   [phone]  OFFICE PROGRESS NOTE  [name], MD [place] [address]  DIAGNOSIS: Stage IV (T3, N0, M1 a) non-small cell lung cancer, squamous cell carcinoma presented with multiple bilateral pulmonary nodules diagnosed in April 2018.  PRIOR THERAPY:  Systemic chemotherapy with carboplatin for AUC of 5 and paclitaxel 175 MG/M2 every 3 weeks with Neulasta support. Status post 6 cycles.  CURRENT THERAPY: Second line treatment with immunotherapy was Nivolumab 480 mg IV every 4 weeks.  First cycle November 14, 2017.  Status post 5 cycles.  INTERVAL HISTORY: [name] [name] 67 y.o. male returns to the clinic today for follow-up visit.  The patient is feeling fine today with no specific complaints except for the persistent peripheral neuropathy.  He is requesting refill of his gabapentin.  He denied having any skin rash or diarrhea.  He has no chest pain, shortness of breath, cough or hemoptysis.  He denied having any fever or chills.  He has no nausea, vomiting or constipation.  The patient is here today for evaluation before starting cycle #6 of his treatment.  MEDICAL HISTORY: Past Medical History:  Diagnosis Date  . Brain metastases ([facility]) 02/15/2017  . Depression 01/29/2017  . Dyspnea   . Encounter for antineoplastic chemotherapy 01/29/2017  . Goals of care, counseling/discussion 01/29/2017  . Hypertension   . Metastatic squamous cell carcinoma to lung, unspecified laterality ([place]) 01/28/2017  . Stroke [facility])     ALLERGIES:  is allergic to no known allergies.  MEDICATIONS:  Current Outpatient Medications  Medication Sig Dispense Refill  . acetaminophen (TYLENOL) 325 MG tablet You can take 2 every 4 hours as needed.  You can buy it over the counter DO NOT TAKE MORE THAN 4000 MG OF TYLENOL PER DAY.  IT CAN HARM YOUR LIVER.    Marland Kitchen amitriptyline (ELAVIL) 50 MG tablet Take 1 tablet (50 mg total) by mouth at bedtime. 30  tablet 3  . aspirin EC 81 MG tablet Take 81 mg by mouth daily.    Marland Kitchen gabapentin (NEURONTIN) 300 MG capsule Take 2 capsules (600 mg total) by mouth 3 (three) times daily. 90 capsule 2  . hydrochlorothiazide (HYDRODIURIL) 25 MG tablet Restart this after you see your Primary care doctor.  Check your blood pressure at home and record.    Marland Kitchen lisinopril (PRINIVIL,ZESTRIL) 40 MG tablet Take 40 mg by mouth daily.    . metoprolol succinate (TOPROL-XL) 50 MG 24 hr tablet 50 mg.  0  . mirtazapine (REMERON) 30 MG tablet Take 1 tablet (30 mg total) by mouth at bedtime. 30 tablet 1  . pantoprazole (PROTONIX) 40 MG tablet Take 1 tablet (40 mg total) by mouth 2 (two) times daily. 60 tablet 1  . ranitidine (ZANTAC) 150 MG tablet Take 150 mg by mouth 2 (two) times daily.    . simvastatin (ZOCOR) 5 MG tablet Take 1 tablet (5 mg total) by mouth daily. 5 tablet 3  . temazepam (RESTORIL) 30 MG capsule TAKE 1 CAPSULE BY MOUTH ONCE DAILY AT BEDTIME AS NEEDED FOR SLEEP 30 capsule 0   No current facility-administered medications for this visit.     SURGICAL HISTORY:  Past Surgical History:  Procedure Laterality Date  . BACK SURGERY     cyst removal  . COLONOSCOPY    . LAPAROTOMY N/A 02/25/2017   Procedure: EXPLORATORY LAPAROTOMY MODIFIED GRAHAM'S PATCH;  Surgeon: Kieth Brightly Arta Bruce, MD;  Location: [facility];  Service: General;  Laterality: N/A;  . MINOR PLACEMENT OF FIDUCIAL N/A 01/19/2017   Procedure: MINOR PLACEMENT OF FIDUCIAL x 6;  Surgeon: Collene Gobble, MD;  Location: [facility];  Service: Thoracic;  Laterality: N/A;  . VIDEO BRONCHOSCOPY WITH ENDOBRONCHIAL NAVIGATION N/A 01/19/2017   Procedure: VIDEO BRONCHOSCOPY WITH ENDOBRONCHIAL NAVIGATION;  Surgeon: Collene Gobble, MD;  Location: [facility];  Service: Thoracic;  Laterality: N/A;    REVIEW OF SYSTEMS:  A comprehensive review of systems was negative except for: Constitutional: positive for fatigue Neurological: positive for paresthesia   PHYSICAL EXAMINATION: General  appearance: alert, cooperative and no distress Head: Normocephalic, without obvious abnormality, atraumatic Neck: no adenopathy, no JVD, supple, symmetrical, trachea midline and thyroid not enlarged, symmetric, no tenderness/mass/nodules Lymph nodes: Cervical, supraclavicular, and axillary nodes normal. Resp: clear to auscultation bilaterally Back: symmetric, no curvature. ROM normal. No CVA tenderness. Cardio: regular rate and rhythm, S1, S2 normal, no murmur, click, rub or gallop GI: soft, non-tender; bowel sounds normal; no masses,  no organomegaly Extremities: extremities normal, atraumatic, no cyanosis or edema  ECOG PERFORMANCE STATUS: 1 - Symptomatic but completely ambulatory   Blood pressure (!) 143/89, pulse 78, temperature 97.7 F (36.5 C), temperature source Oral, resp. rate 18, height 5\' 9"  (1.753 m), weight 211 lb 9.6 oz (96 kg), SpO2 97 %.  LABORATORY DATA: Lab Results  Component Value Date   WBC 9.7 04/13/2018   HGB 13.8 04/13/2018   HCT 41.7 04/13/2018   MCV 86.8 04/13/2018   PLT 256 04/13/2018      Chemistry      Component Value Date/Time   NA 136 03/16/2018 1009   NA 139 10/06/2017 1052   K 4.9 03/16/2018 1009   K 4.2 10/06/2017 1052   CL 103 03/16/2018 1009   CO2 25 03/16/2018 1009   CO2 25 10/06/2017 1052   BUN 19 03/16/2018 1009   BUN 13.2 10/06/2017 1052   CREATININE 0.93 03/16/2018 1009   CREATININE 0.9 10/06/2017 1052      Component Value Date/Time   CALCIUM 9.3 03/16/2018 1009   CALCIUM 9.5 10/06/2017 1052   ALKPHOS 67 03/16/2018 1009   ALKPHOS 68 10/06/2017 1052   AST 15 03/16/2018 1009   AST 14 10/06/2017 1052   ALT 15 03/16/2018 1009   ALT 13 10/06/2017 1052   BILITOT 0.3 03/16/2018 1009   BILITOT 0.44 10/06/2017 1052       RADIOGRAPHIC STUDIES: No results found.  ASSESSMENT AND PLAN:  This is a very pleasant 67 years old white male with stage IV non-small cell lung cancer, squamous cell carcinoma presented with multiple bilateral  pulmonary nodules. The patient was treated with systemic chemotherapy with carboplatin and paclitaxel status post 6 cycles. He tolerated the last cycle of his treatment well except for the persistent peripheral neuropathy. The patient has been in observation for the last 3 months. He had a repeat CT scan of the chest, abdomen and pelvis performed recently.  Has a scan showed mild interval enlargement of left upper lobe as well as right upper lobe pulmonary nodules concerning for disease progression. The patient was started on treatment with immunotherapy with Nivolumab 480 mg IV every 4 weeks status post 5 cycles.   The patient continues to tolerate this treatment well with no concerning complaints. I recommended for him to proceed with cycle #6 today as a scheduled. I will see him back for follow-up visit in 3 weeks for evaluation after repeating CT scan of the  chest, abdomen and pelvis for restaging of his disease. He was advised to call immediately if he has any concerning symptoms in the interval. The patient voices understanding of current disease status and treatment options and is in agreement with the current care plan. All questions were answered. The patient knows to call the clinic with any problems, questions or concerns. We can certainly see the patient much sooner if necessary.  Disclaimer: This note was dictated with voice recognition software. Similar sounding words can inadvertently be transcribed and may not be corrected upon review.

## 2018-04-13 NOTE — Patient Instructions (Signed)
Steps to Quit Smoking Smoking tobacco can be bad for your health. It can also affect almost every organ in your body. Smoking puts you and people around you at risk for many serious long-lasting (chronic) diseases. Quitting smoking is hard, but it is one of the best things that you can do for your health. It is never too late to quit. What are the benefits of quitting smoking? When you quit smoking, you lower your risk for getting serious diseases and conditions. They can include:  Lung cancer or lung disease.  Heart disease.  Stroke.  Heart attack.  Not being able to have children (infertility).  Weak bones (osteoporosis) and broken bones (fractures).  If you have coughing, wheezing, and shortness of breath, those symptoms may get better when you quit. You may also get sick less often. If you are pregnant, quitting smoking can help to lower your chances of having a baby of low birth weight. What can I do to help me quit smoking? Talk with your doctor about what can help you quit smoking. Some things you can do (strategies) include:  Quitting smoking totally, instead of slowly cutting back how much you smoke over a period of time.  Going to in-person counseling. You are more likely to quit if you go to many counseling sessions.  Using resources and support systems, such as: ? Online chats with a counselor. ? Phone quitlines. ? Printed self-help materials. ? Support groups or group counseling. ? Text messaging programs. ? Mobile phone apps or applications.  Taking medicines. Some of these medicines may have nicotine in them. If you are pregnant or breastfeeding, do not take any medicines to quit smoking unless your doctor says it is okay. Talk with your doctor about counseling or other things that can help you.  Talk with your doctor about using more than one strategy at the same time, such as taking medicines while you are also going to in-person counseling. This can help make  quitting easier. What things can I do to make it easier to quit? Quitting smoking might feel very hard at first, but there is a lot that you can do to make it easier. Take these steps:  Talk to your family and friends. Ask them to support and encourage you.  Call phone quitlines, reach out to support groups, or work with a counselor.  Ask people who smoke to not smoke around you.  Avoid places that make you want (trigger) to smoke, such as: ? Bars. ? Parties. ? Smoke-break areas at work.  Spend time with people who do not smoke.  Lower the stress in your life. Stress can make you want to smoke. Try these things to help your stress: ? Getting regular exercise. ? Deep-breathing exercises. ? Yoga. ? Meditating. ? Doing a body scan. To do this, close your eyes, focus on one area of your body at a time from head to toe, and notice which parts of your body are tense. Try to relax the muscles in those areas.  Download or buy apps on your mobile phone or tablet that can help you stick to your quit plan. There are many free apps, such as QuitGuide from the CDC (Centers for Disease Control and Prevention). You can find more support from smokefree.gov and other websites.  This information is not intended to replace advice given to you by your health care provider. Make sure you discuss any questions you have with your health care provider. Document Released: 07/31/2009 Document   Revised: 06/01/2016 Document Reviewed: 02/18/2015 Elsevier Interactive Patient Education  2018 [employer]  

## 2018-04-14 ENCOUNTER — Other Ambulatory Visit: Payer: Self-pay | Admitting: *Deleted

## 2018-04-14 DIAGNOSIS — G4700 Insomnia, unspecified: Secondary | ICD-10-CM

## 2018-04-14 DIAGNOSIS — C7800 Secondary malignant neoplasm of unspecified lung: Secondary | ICD-10-CM

## 2018-04-14 NOTE — Telephone Encounter (Signed)
Attempt to refill Restoril for pt, Pharmacist at [facility] advised pt still has a refill on current rx. Pt will be notified by [facility] when rx ready. No further concerns.

## 2018-05-08 ENCOUNTER — Telehealth: Payer: Self-pay | Admitting: Medical Oncology

## 2018-05-08 NOTE — Telephone Encounter (Signed)
CT appt confirmed.

## 2018-05-09 ENCOUNTER — Ambulatory Visit (HOSPITAL_COMMUNITY)
Admission: RE | Admit: 2018-05-09 | Discharge: 2018-05-09 | Disposition: A | Discharge status: Home / Self Care | Payer: Medicare Other | Source: Ambulatory Visit | Attending: Internal Medicine | Admitting: Internal Medicine

## 2018-05-09 ENCOUNTER — Ambulatory Visit (HOSPITAL_COMMUNITY): Payer: Medicare Other

## 2018-05-09 ENCOUNTER — Encounter (HOSPITAL_COMMUNITY): Payer: Self-pay

## 2018-05-09 DIAGNOSIS — C3490 Malignant neoplasm of unspecified part of unspecified bronchus or lung: Secondary | ICD-10-CM | POA: Insufficient documentation

## 2018-05-09 DIAGNOSIS — I714 Abdominal aortic aneurysm, without rupture: Secondary | ICD-10-CM | POA: Insufficient documentation

## 2018-05-09 DIAGNOSIS — K439 Ventral hernia without obstruction or gangrene: Secondary | ICD-10-CM | POA: Diagnosis not present

## 2018-05-09 DIAGNOSIS — R918 Other nonspecific abnormal finding of lung field: Secondary | ICD-10-CM | POA: Insufficient documentation

## 2018-05-09 MED ORDER — IOPAMIDOL (ISOVUE-300) INJECTION 61%
INTRAVENOUS | Status: AC
  Filled 2018-05-09: qty 100

## 2018-05-09 MED ORDER — IOPAMIDOL (ISOVUE-300) INJECTION 61%
100.0000 mL | Freq: Once | INTRAVENOUS | Status: AC | PRN
  Administered 2018-05-09: 100 mL via INTRAVENOUS

## 2018-05-10 NOTE — Assessment & Plan Note (Addendum)
This is a very pleasant 67 year old white male with stage IV non-small cell lung cancer, squamous cell carcinoma presented with multiple bilateral pulmonary nodules. The patient was treated with systemic chemotherapy with carboplatin and paclitaxel status post 6 cycles. He tolerated the last cycle of his treatment well except for the persistent peripheral neuropathy. The patient was then on observation for 3 months.   He had a repeat CT scan of the chest, abdomen and pelvis which showed mild interval enlargement of left upper lobe as well as right upper lobe pulmonary nodules concerning for disease progression. The patient was started on treatment with immunotherapy with Nivolumab 480 mg IV every 4 weeks status post 6 cycles.   The patient continues to tolerate this treatment well with no concerning complaints. He had a restaging CT scan of the chest, abdomen, pelvis and is here to discuss the results.  The patient was seen with Dr. Julien Nordmann.  The CT scan results and images were reviewed with the patient.  We discussed that he has interval development of multiple new small nodules in the upper lobes bilaterally as well as the right lower lobe.  Other areas remain stable.  We discussed that this could represent inflammation versus progression of his cancer.  Recommend that he continue on the current treatment with Nivolumab 480 mg every 4 weeks for 2 more cycles and then we will restage him again.  If these nodules continue to grow, we may need to consider changing treatment.  The patient is in agreement with this plan.  He will proceed with cycle #7 of his treatment today as scheduled.  He will follow-up in 4 weeks for evaluation prior to cycle #8 of his treatment.  He was advised to call immediately if he has any concerning symptoms in the interval. The patient voices understanding of current disease status and treatment options and is in agreement with the current care plan. All questions were  answered. The patient knows to call the clinic with any problems, questions or concerns. We can certainly see the patient much sooner if necessary.

## 2018-05-10 NOTE — Progress Notes (Signed)
[facility] OFFICE PROGRESS NOTE  [name], MD [place] [address]  DIAGNOSIS: Stage IV (T3, N0, M1 a) non-small cell lung cancer, squamous cell carcinoma presented with multiple bilateral pulmonary nodules diagnosed in April 2018.  PRIOR THERAPY:  Systemic chemotherapy with carboplatin for AUC of 5 and paclitaxel 175 MG/M2 every 3 weeks with Neulasta support. Status post 6 cycles.  CURRENT THERAPY: Second line treatment with immunotherapy was Nivolumab 480 mg IV every 4 weeks.  First cycle November 14, 2017.  Status post 6 cycles.  INTERVAL HISTORY: FINNBAR CEDILLOS 67 y.o. male returns for routine follow-up visit by himself.  The patient is feeling fine today with no specific complaints except for ongoing peripheral neuropathy.  He remains on gabapentin without much improvement.  He denies fevers and chills.  Denies chest pain, cough, hemoptysis.  Reports intermittent shortness of breath.  Denies nausea, vomiting, constipation, diarrhea.  Denies recent weight loss or night sweats.  The patient is here to discuss his restaging CT scan.  MEDICAL HISTORY: Past Medical History:  Diagnosis Date  . Brain metastases ([place]) 02/15/2017  . Depression 01/29/2017  . Dyspnea   . Encounter for antineoplastic chemotherapy 01/29/2017  . Goals of care, counseling/discussion 01/29/2017  . Hypertension   . Metastatic squamous cell carcinoma to lung, unspecified laterality ([place]) 01/28/2017  . Stroke [facility])     ALLERGIES:  is allergic to no known allergies.  MEDICATIONS:  Current Outpatient Medications  Medication Sig Dispense Refill  . acetaminophen (TYLENOL) 325 MG tablet You can take 2 every 4 hours as needed.  You can buy it over the counter DO NOT TAKE MORE THAN 4000 MG OF TYLENOL PER DAY.  IT CAN HARM YOUR LIVER.    Marland Kitchen amitriptyline (ELAVIL) 50 MG tablet Take 1 tablet (50 mg total) by mouth at bedtime. 30 tablet 3  . aspirin EC 81 MG tablet Take 81 mg by mouth daily.    Marland Kitchen  gabapentin (NEURONTIN) 300 MG capsule Take 2 capsules (600 mg total) by mouth 3 (three) times daily. 90 capsule 2  . hydrochlorothiazide (HYDRODIURIL) 25 MG tablet Restart this after you see your Primary care doctor.  Check your blood pressure at home and record.    Marland Kitchen lisinopril (PRINIVIL,ZESTRIL) 40 MG tablet Take 40 mg by mouth daily.    . metoprolol succinate (TOPROL-XL) 50 MG 24 hr tablet 50 mg.  0  . mirtazapine (REMERON) 30 MG tablet Take 1 tablet (30 mg total) by mouth at bedtime. 30 tablet 1  . pantoprazole (PROTONIX) 40 MG tablet Take 1 tablet (40 mg total) by mouth 2 (two) times daily. 60 tablet 1  . ranitidine (ZANTAC) 150 MG tablet Take 150 mg by mouth 2 (two) times daily.    . simvastatin (ZOCOR) 5 MG tablet Take 1 tablet (5 mg total) by mouth daily. 5 tablet 3  . temazepam (RESTORIL) 30 MG capsule TAKE 1 CAPSULE BY MOUTH ONCE DAILY AT BEDTIME AS NEEDED FOR SLEEP 30 capsule 0   No current facility-administered medications for this visit.     SURGICAL HISTORY:  Past Surgical History:  Procedure Laterality Date  . BACK SURGERY     cyst removal  . COLONOSCOPY    . LAPAROTOMY N/A 02/25/2017   Procedure: EXPLORATORY LAPAROTOMY MODIFIED GRAHAM'S PATCH;  Surgeon: Kinsinger, Arta Bruce, MD;  Location: [facility];  Service: General;  Laterality: N/A;  . MINOR PLACEMENT OF FIDUCIAL N/A 01/19/2017   Procedure: MINOR PLACEMENT OF FIDUCIAL  x 6;  Surgeon: Collene Gobble, MD;  Location: [facility];  Service: Thoracic;  Laterality: N/A;  . VIDEO BRONCHOSCOPY WITH ENDOBRONCHIAL NAVIGATION N/A 01/19/2017   Procedure: VIDEO BRONCHOSCOPY WITH ENDOBRONCHIAL NAVIGATION;  Surgeon: Collene Gobble, MD;  Location: [facility];  Service: Thoracic;  Laterality: N/A;    REVIEW OF SYSTEMS:   Review of Systems  Constitutional: Negative for appetite change, chills, fatigue, fever and unexpected weight change.  HENT:   Negative for mouth sores, nosebleeds, sore throat and trouble swallowing.   Eyes: Negative for eye problems  and icterus.  Respiratory: Negative for cough, hemoptysis, and wheezing.  Positive for intermittent shortness of breath.  Cardiovascular: Negative for chest pain and leg swelling.  Gastrointestinal: Negative for abdominal pain, constipation, diarrhea, nausea and vomiting.  Genitourinary: Negative for bladder incontinence, difficulty urinating, dysuria, frequency and hematuria.   Musculoskeletal: Negative for back pain, gait problem, neck pain and neck stiffness.  Skin: Negative for itching and rash.  Neurological: Negative for dizziness, extremity weakness, gait problem, headaches, light-headedness and seizures.  Hematological: Negative for adenopathy. Does not bruise/bleed easily.  Psychiatric/Behavioral: Negative for confusion, depression and sleep disturbance. The patient is not nervous/anxious.     PHYSICAL EXAMINATION:  Blood pressure (!) 158/93, pulse 65, temperature 97.7 F (36.5 C), temperature source Oral, resp. rate 17, height 5\' 9"  (1.753 m), weight 215 lb 6.4 oz (97.7 kg), SpO2 99 %.  ECOG PERFORMANCE STATUS: 1 - Symptomatic but completely ambulatory  Physical Exam  Constitutional: Oriented to person, place, and time and well-developed, well-nourished, and in no distress. No distress.  HENT:  Head: Normocephalic and atraumatic.  Mouth/Throat: Oropharynx is clear and moist. No oropharyngeal exudate.  Eyes: Conjunctivae are normal. Right eye exhibits no discharge. Left eye exhibits no discharge. No scleral icterus.  Neck: Normal range of motion. Neck supple.  Cardiovascular: Normal rate, regular rhythm, normal heart sounds and intact distal pulses.   Pulmonary/Chest: Effort normal and breath sounds normal. No respiratory distress. No wheezes. No rales.  Abdominal: Soft. Bowel sounds are normal. Exhibits no distension and no mass. There is no tenderness.  Musculoskeletal: Normal range of motion. Exhibits no edema.  Lymphadenopathy:    No cervical adenopathy.  Neurological:  Alert and oriented to person, place, and time. Exhibits normal muscle tone. Gait normal. Coordination normal.  Skin: Skin is warm and dry. No rash noted. Not diaphoretic. No erythema. No pallor.  Psychiatric: Mood, memory and judgment normal.  Vitals reviewed.  LABORATORY DATA: Lab Results  Component Value Date   WBC 9.3 05/11/2018   HGB 15.5 05/11/2018   HCT 46.9 05/11/2018   MCV 85.5 05/11/2018   PLT 262 05/11/2018      Chemistry      Component Value Date/Time   NA 136 05/11/2018 0925   NA 139 10/06/2017 1052   K 4.5 05/11/2018 0925   K 4.2 10/06/2017 1052   CL 100 05/11/2018 0925   CO2 28 05/11/2018 0925   CO2 25 10/06/2017 1052   BUN 15 05/11/2018 0925   BUN 13.2 10/06/2017 1052   CREATININE 0.95 05/11/2018 0925   CREATININE 0.9 10/06/2017 1052      Component Value Date/Time   CALCIUM 9.4 05/11/2018 0925   CALCIUM 9.5 10/06/2017 1052   ALKPHOS 78 05/11/2018 0925   ALKPHOS 68 10/06/2017 1052   AST 15 05/11/2018 0925   AST 14 10/06/2017 1052   ALT 15 05/11/2018 0925   ALT 13 10/06/2017 1052   BILITOT 0.4 05/11/2018 0925  BILITOT 0.44 10/06/2017 1052       RADIOGRAPHIC STUDIES:  Ct Chest W Contrast  Result Date: 05/09/2018 CLINICAL DATA:  Patient with history of lung cancer diagnosed 2017. Intermittent cough and shortness of breath. EXAM: CT CHEST, ABDOMEN, AND PELVIS WITH CONTRAST TECHNIQUE: Multidetector CT imaging of the chest, abdomen and pelvis was performed following the standard protocol during bolus administration of intravenous contrast. CONTRAST:  121mL ISOVUE-300 IOPAMIDOL (ISOVUE-300) INJECTION 61% COMPARISON:  CT CAP 02/16/2018. FINDINGS: CT CHEST FINDINGS Cardiovascular: Normal heart size. No pericardial effusion. Thoracic aortic vascular calcifications. Mediastinum/Nodes: Normal appearance of the esophagus. Similar-appearing 9 mm precarinal lymph node (image 27; series 2). No hilar or axillary adenopathy. Lungs/Pleura: Central airways are patent.  Centrilobular and paraseptal emphysematous change. Interval resolution of previously described nodular areas of consolidation within the right lower lobe compatible with resolved infectious process. New 4 mm right lower lobe nodule (image 86; series 6). Additional adjacent right lower lobe nodules measuring up to 4 mm are new in the interval (image 80; series 6). Similar-appearing 10 mm irregular nodule right upper lobe (image 47; series 6). Previously described adjacent nodule is decreased in size measuring 5 mm (image 50; series 6), previously 7 mm. Multiple new nodules are demonstrated within the right lung apex measuring up to 6 mm (image 33; series 6). Multiple new nodules demonstrated within the left lung apex measuring up to 4-5 mm. Interval increase in size of left upper lobe nodule measuring 12 x 11 mm (image 65; series 6), previously 12 x 10 mm. Slight interval decrease in size of solid and cystic nodule left upper lobe measuring 26 x 14 mm (image 74; series 6), previously 31 x 19 mm. New 4 mm subpleural left lower lobe nodule (image 97; series 6). No pleural effusion or pneumothorax. Musculoskeletal: Thoracic spine degenerative changes. No aggressive or acute appearing osseous lesions. CT ABDOMEN PELVIS FINDINGS Hepatobiliary: Liver is normal in size and contour. No focal lesion is identified. Gallbladder is unremarkable. No intrahepatic or extrahepatic biliary ductal dilatation. Pancreas: Unremarkable Spleen: Remarkable Adrenals/Urinary Tract: Unchanged 14 mm left adrenal nodule (image 67; series 2). The scratch the stable right adrenal gland thickening. Kidneys enhance symmetrically with contrast. No hydronephrosis. Similar-appearing urinary bladder wall thickening. Stomach/Bowel: Descending and sigmoid colonic diverticulosis. No CT evidence for acute diverticulitis. Normal morphology of the stomach. No evidence for small bowel obstruction. Vascular/Lymphatic: Dilatation of the abdominal aorta measuring  3.1 cm. Peripheral calcified noncalcified atherosclerotic plaque. No retroperitoneal lymphadenopathy. Reproductive: Prostate unremarkable. Other: Small fat containing inguinal hernias, right greater left. Musculoskeletal: Multiple complex small predominantly fat containing ventral abdominal wall hernias superior to the umbilicus. No aggressive or acute appearing osseous lesions. IMPRESSION: 1. Multiple pulmonary nodules are present. Interval development of multiple new small nodules within the upper lobes bilaterally and right lower lobe. Additionally, interval increase in size of nodule within the left upper lobe. 2. Interval resolution of previously described focal nodular areas of consolidation right lower lobe, compatible with improved infectious process. 3. No evidence for metastatic disease in the abdomen or pelvis. 4. Abdominal aortic aneurysm.  Recommend attention on follow-up Electronically Signed   By: Lovey Newcomer M.D.   On: 05/09/2018 16:10   Ct Abdomen Pelvis W Contrast  Result Date: 05/09/2018 CLINICAL DATA:  Patient with history of lung cancer diagnosed 2017. Intermittent cough and shortness of breath. EXAM: CT CHEST, ABDOMEN, AND PELVIS WITH CONTRAST TECHNIQUE: Multidetector CT imaging of the chest, abdomen and pelvis was performed following the standard protocol during bolus  administration of intravenous contrast. CONTRAST:  179mL ISOVUE-300 IOPAMIDOL (ISOVUE-300) INJECTION 61% COMPARISON:  CT CAP 02/16/2018. FINDINGS: CT CHEST FINDINGS Cardiovascular: Normal heart size. No pericardial effusion. Thoracic aortic vascular calcifications. Mediastinum/Nodes: Normal appearance of the esophagus. Similar-appearing 9 mm precarinal lymph node (image 27; series 2). No hilar or axillary adenopathy. Lungs/Pleura: Central airways are patent. Centrilobular and paraseptal emphysematous change. Interval resolution of previously described nodular areas of consolidation within the right lower lobe compatible with  resolved infectious process. New 4 mm right lower lobe nodule (image 86; series 6). Additional adjacent right lower lobe nodules measuring up to 4 mm are new in the interval (image 80; series 6). Similar-appearing 10 mm irregular nodule right upper lobe (image 47; series 6). Previously described adjacent nodule is decreased in size measuring 5 mm (image 50; series 6), previously 7 mm. Multiple new nodules are demonstrated within the right lung apex measuring up to 6 mm (image 33; series 6). Multiple new nodules demonstrated within the left lung apex measuring up to 4-5 mm. Interval increase in size of left upper lobe nodule measuring 12 x 11 mm (image 65; series 6), previously 12 x 10 mm. Slight interval decrease in size of solid and cystic nodule left upper lobe measuring 26 x 14 mm (image 74; series 6), previously 31 x 19 mm. New 4 mm subpleural left lower lobe nodule (image 97; series 6). No pleural effusion or pneumothorax. Musculoskeletal: Thoracic spine degenerative changes. No aggressive or acute appearing osseous lesions. CT ABDOMEN PELVIS FINDINGS Hepatobiliary: Liver is normal in size and contour. No focal lesion is identified. Gallbladder is unremarkable. No intrahepatic or extrahepatic biliary ductal dilatation. Pancreas: Unremarkable Spleen: Remarkable Adrenals/Urinary Tract: Unchanged 14 mm left adrenal nodule (image 67; series 2). The scratch the stable right adrenal gland thickening. Kidneys enhance symmetrically with contrast. No hydronephrosis. Similar-appearing urinary bladder wall thickening. Stomach/Bowel: Descending and sigmoid colonic diverticulosis. No CT evidence for acute diverticulitis. Normal morphology of the stomach. No evidence for small bowel obstruction. Vascular/Lymphatic: Dilatation of the abdominal aorta measuring 3.1 cm. Peripheral calcified noncalcified atherosclerotic plaque. No retroperitoneal lymphadenopathy. Reproductive: Prostate unremarkable. Other: Small fat containing  inguinal hernias, right greater left. Musculoskeletal: Multiple complex small predominantly fat containing ventral abdominal wall hernias superior to the umbilicus. No aggressive or acute appearing osseous lesions. IMPRESSION: 1. Multiple pulmonary nodules are present. Interval development of multiple new small nodules within the upper lobes bilaterally and right lower lobe. Additionally, interval increase in size of nodule within the left upper lobe. 2. Interval resolution of previously described focal nodular areas of consolidation right lower lobe, compatible with improved infectious process. 3. No evidence for metastatic disease in the abdomen or pelvis. 4. Abdominal aortic aneurysm.  Recommend attention on follow-up Electronically Signed   By: Lovey Newcomer M.D.   On: 05/09/2018 16:10     ASSESSMENT/PLAN:  Metastatic squamous cell carcinoma to lung, unspecified laterality [facility]) This is a very pleasant 67 year old white male with stage IV non-small cell lung cancer, squamous cell carcinoma presented with multiple bilateral pulmonary nodules. The patient was treated with systemic chemotherapy with carboplatin and paclitaxel status post 6 cycles. He tolerated the last cycle of his treatment well except for the persistent peripheral neuropathy. The patient was then on observation for 3 months.   He had a repeat CT scan of the chest, abdomen and pelvis which showed mild interval enlargement of left upper lobe as well as right upper lobe pulmonary nodules concerning for disease progression. The patient was  started on treatment with immunotherapy with Nivolumab 480 mg IV every 4 weeks status post 6 cycles.   The patient continues to tolerate this treatment well with no concerning complaints. He had a restaging CT scan of the chest, abdomen, pelvis and is here to discuss the results.  The patient was seen with Dr. Julien Nordmann.  The CT scan results and images were reviewed with the patient.  We discussed that  he has interval development of multiple new small nodules in the upper lobes bilaterally as well as the right lower lobe.  Other areas remain stable.  We discussed that this could represent inflammation versus progression of his cancer.  Recommend that he continue on the current treatment with Nivolumab 480 mg every 4 weeks for 2 more cycles and then we will restage him again.  If these nodules continue to grow, we may need to consider changing treatment.  The patient is in agreement with this plan.  He will proceed with cycle #7 of his treatment today as scheduled.  He will follow-up in 4 weeks for evaluation prior to cycle #8 of his treatment.  He was advised to call immediately if he has any concerning symptoms in the interval. The patient voices understanding of current disease status and treatment options and is in agreement with the current care plan. All questions were answered. The patient knows to call the clinic with any problems, questions or concerns. We can certainly see the patient much sooner if necessary.   No orders of the defined types were placed in this encounter.  Mikey Bussing, DNP, AGPCNP-BC, AOCNP 05/11/18  ADDENDUM: Hematology/Oncology Attending: I had a face-to-face encounter with the patient.  I recommended his care plan.  This is a very pleasant 67 years old white male with a stage IV non-small cell lung cancer, squamous cell carcinoma status post induction systemic chemotherapy with carboplatin and paclitaxel with partial response.  The patient was followed by observation and repeat imaging studies showed some concerning findings for disease progression.  He was a started on second line treatment with immunotherapy was Nivolumab 480 mg IV every 4 weeks status post 6 cycles.  He is tolerating this treatment much better and he feels well.  He had repeat CT scan of the chest, abdomen and pelvis performed recently.  I personally and independently reviewed the scans and  discussed the results with the patient and showed him the images today.  His a scan showed stable disease except for few tiny bilateral pulmonary nodules suspicious for disease progression versus inflammatory process. I recommended for the patient to continue his current treatment with Nivolumab for now for at least 2 more cycles before repeating imaging studies for evaluation of his disease.  If this nodule continues to increase in size or he develop any other signs of disease progression, we will consider changing his treatment to either systemic chemotherapy or discuss palliative care. The patient agreed to the current plan. He will proceed with cycle #7 today. He will come back for follow-up visit in 4 weeks for evaluation before starting cycle #8. The patient was advised to call immediately if he has any concerning symptoms in the interval.  Disclaimer: This note was dictated with voice recognition software. Similar sounding words can inadvertently be transcribed and may be missed upon review. Eilleen Kempf, MD 05/12/18

## 2018-05-11 ENCOUNTER — Inpatient Hospital Stay: Payer: Medicare Other | Attending: Internal Medicine

## 2018-05-11 ENCOUNTER — Inpatient Hospital Stay (HOSPITAL_BASED_OUTPATIENT_CLINIC_OR_DEPARTMENT_OTHER): Payer: Medicare Other | Admitting: Oncology

## 2018-05-11 ENCOUNTER — Telehealth: Payer: Self-pay | Admitting: Oncology

## 2018-05-11 ENCOUNTER — Encounter: Payer: Self-pay | Admitting: Oncology

## 2018-05-11 ENCOUNTER — Inpatient Hospital Stay: Payer: Medicare Other

## 2018-05-11 VITALS — BP 158/93 | HR 65 | Temp 97.7°F | Resp 17 | Ht 69.0 in | Wt 215.4 lb

## 2018-05-11 DIAGNOSIS — Z5112 Encounter for antineoplastic immunotherapy: Secondary | ICD-10-CM | POA: Diagnosis not present

## 2018-05-11 DIAGNOSIS — C3490 Malignant neoplasm of unspecified part of unspecified bronchus or lung: Secondary | ICD-10-CM | POA: Insufficient documentation

## 2018-05-11 DIAGNOSIS — Z79899 Other long term (current) drug therapy: Secondary | ICD-10-CM | POA: Diagnosis not present

## 2018-05-11 DIAGNOSIS — C7800 Secondary malignant neoplasm of unspecified lung: Secondary | ICD-10-CM

## 2018-05-11 DIAGNOSIS — G620 Drug-induced polyneuropathy: Secondary | ICD-10-CM | POA: Diagnosis not present

## 2018-05-11 LAB — CMP (CANCER CENTER ONLY)
ALBUMIN: 3.9 g/dL (ref 3.5–5.0)
ALT: 15 U/L (ref 0–44)
ANION GAP: 8 (ref 5–15)
AST: 15 U/L (ref 15–41)
Alkaline Phosphatase: 78 U/L (ref 38–126)
BUN: 15 mg/dL (ref 8–23)
CHLORIDE: 100 mmol/L (ref 98–111)
CO2: 28 mmol/L (ref 22–32)
Calcium: 9.4 mg/dL (ref 8.9–10.3)
Creatinine: 0.95 mg/dL (ref 0.61–1.24)
GFR, Est AFR Am: >60 mL/min (ref >60)
GFR, Estimated: >60 mL/min (ref >60)
GLUCOSE: 97 mg/dL (ref 70–99)
POTASSIUM: 4.5 mmol/L (ref 3.5–5.1)
SODIUM: 136 mmol/L (ref 135–145)
TOTAL PROTEIN: 7.7 g/dL (ref 6.5–8.1)
Total Bilirubin: 0.4 mg/dL (ref 0.3–1.2)

## 2018-05-11 LAB — TSH: TSH: 0.568 u[IU]/mL (ref 0.320–4.118)

## 2018-05-11 LAB — CBC WITH DIFFERENTIAL (CANCER CENTER ONLY)
BASOS ABS: 0.1 10*3/uL (ref 0.0–0.1)
Basophils Relative: 1 %
EOS PCT: 4 %
Eosinophils Absolute: 0.4 10*3/uL (ref 0.0–0.5)
HCT: 46.9 % (ref 38.4–49.9)
Hemoglobin: 15.5 g/dL (ref 13.0–17.1)
LYMPHS ABS: 4 10*3/uL — AB (ref 0.9–3.3)
Lymphocytes Relative: 43 %
MCH: 28.4 pg (ref 27.2–33.4)
MCHC: 33.2 g/dL (ref 32.0–36.0)
MCV: 85.5 fL (ref 79.3–98.0)
Monocytes Absolute: 0.7 10*3/uL (ref 0.1–0.9)
Monocytes Relative: 8 %
NEUTROS ABS: 4.2 10*3/uL (ref 1.5–6.5)
Neutrophils Relative %: 44 %
PLATELETS: 262 10*3/uL (ref 140–400)
RBC: 5.48 MIL/uL (ref 4.20–5.82)
RDW: 14.4 % (ref 11.0–14.6)
WBC: 9.3 10*3/uL (ref 4.0–10.3)

## 2018-05-11 MED ORDER — NIVOLUMAB CHEMO INJECTION 100 MG/10ML
480.0000 mg | Freq: Once | INTRAVENOUS | Status: AC
  Administered 2018-05-11: 480 mg via INTRAVENOUS
  Filled 2018-05-11: qty 48

## 2018-05-11 MED ORDER — SODIUM CHLORIDE 0.9 % IV SOLN
Freq: Once | INTRAVENOUS | Status: AC
  Administered 2018-05-11: 11:00:00 via INTRAVENOUS
  Filled 2018-05-11: qty 250

## 2018-05-11 NOTE — Telephone Encounter (Signed)
Appts already scheduled per 7/25 los. - no additional appts added.

## 2018-05-12 ENCOUNTER — Encounter: Payer: Self-pay | Admitting: *Deleted

## 2018-05-12 NOTE — Progress Notes (Signed)
[facility] Clinical Social Work  Clinical Social Work was referred by Stefanie Libel, financial advocate, for information on free AirBNB program  Clinical Social Worker contacted patient by phone  to offer support and assess for needs.  CSW explained patient must called [facility] directly to determine if he qualifies.  Patient stated he may not need a place to stay now that he is coming less to the cancer center.  Patient plans to follow up with CSW at a later time if he is interested or needs assistance.        Gwinda Maine, LCSW  Clinical Social Worker [facility]

## 2018-05-15 ENCOUNTER — Other Ambulatory Visit: Payer: Self-pay | Admitting: Internal Medicine

## 2018-05-15 DIAGNOSIS — G4700 Insomnia, unspecified: Secondary | ICD-10-CM

## 2018-05-15 DIAGNOSIS — C7800 Secondary malignant neoplasm of unspecified lung: Secondary | ICD-10-CM

## 2018-06-05 ENCOUNTER — Telehealth: Payer: Self-pay | Admitting: Medical Oncology

## 2018-06-05 NOTE — Telephone Encounter (Signed)
Schedule change requested

## 2018-06-06 ENCOUNTER — Telehealth: Payer: Self-pay | Admitting: Internal Medicine

## 2018-06-06 NOTE — Telephone Encounter (Signed)
Spoke to patient regarding upcoming aug appt updates per 8/19 sch message. MM nurse asked that patient come in for appts and will be seen upon arrival.

## 2018-06-08 ENCOUNTER — Inpatient Hospital Stay (HOSPITAL_BASED_OUTPATIENT_CLINIC_OR_DEPARTMENT_OTHER): Payer: Medicare Other | Admitting: Internal Medicine

## 2018-06-08 ENCOUNTER — Telehealth: Payer: Self-pay | Admitting: Internal Medicine

## 2018-06-08 ENCOUNTER — Inpatient Hospital Stay: Payer: Medicare Other | Attending: Internal Medicine

## 2018-06-08 ENCOUNTER — Encounter: Payer: Self-pay | Admitting: Internal Medicine

## 2018-06-08 ENCOUNTER — Inpatient Hospital Stay: Payer: Medicare Other

## 2018-06-08 VITALS — BP 148/95 | HR 70 | Temp 97.8°F | Resp 18 | Ht 69.0 in | Wt 217.5 lb

## 2018-06-08 DIAGNOSIS — Z79899 Other long term (current) drug therapy: Secondary | ICD-10-CM | POA: Insufficient documentation

## 2018-06-08 DIAGNOSIS — C3490 Malignant neoplasm of unspecified part of unspecified bronchus or lung: Secondary | ICD-10-CM | POA: Insufficient documentation

## 2018-06-08 DIAGNOSIS — Z5112 Encounter for antineoplastic immunotherapy: Secondary | ICD-10-CM | POA: Insufficient documentation

## 2018-06-08 DIAGNOSIS — G620 Drug-induced polyneuropathy: Secondary | ICD-10-CM | POA: Diagnosis not present

## 2018-06-08 DIAGNOSIS — C7931 Secondary malignant neoplasm of brain: Secondary | ICD-10-CM

## 2018-06-08 DIAGNOSIS — I10 Essential (primary) hypertension: Secondary | ICD-10-CM

## 2018-06-08 DIAGNOSIS — C7800 Secondary malignant neoplasm of unspecified lung: Secondary | ICD-10-CM

## 2018-06-08 LAB — CMP (CANCER CENTER ONLY)
ALBUMIN: 3.7 g/dL (ref 3.5–5.0)
ALT: 13 U/L (ref 0–44)
AST: 17 U/L (ref 15–41)
Alkaline Phosphatase: 81 U/L (ref 38–126)
Anion gap: 10 (ref 5–15)
BILIRUBIN TOTAL: 0.3 mg/dL (ref 0.3–1.2)
BUN: 17 mg/dL (ref 8–23)
CHLORIDE: 102 mmol/L (ref 98–111)
CO2: 27 mmol/L (ref 22–32)
Calcium: 9.4 mg/dL (ref 8.9–10.3)
Creatinine: 0.88 mg/dL (ref 0.61–1.24)
GFR, Est AFR Am: >60 mL/min (ref >60)
GLUCOSE: 101 mg/dL — AB (ref 70–99)
Potassium: 4.4 mmol/L (ref 3.5–5.1)
Sodium: 139 mmol/L (ref 135–145)
TOTAL PROTEIN: 7.4 g/dL (ref 6.5–8.1)

## 2018-06-08 LAB — CBC WITH DIFFERENTIAL (CANCER CENTER ONLY)
BASOS ABS: 0.1 10*3/uL (ref 0.0–0.1)
Basophils Relative: 1 %
Eosinophils Absolute: 0.3 10*3/uL (ref 0.0–0.5)
Eosinophils Relative: 3 %
HCT: 44 % (ref 38.4–49.9)
Hemoglobin: 14.7 g/dL (ref 13.0–17.1)
LYMPHS PCT: 33 %
Lymphs Abs: 3.4 10*3/uL — ABNORMAL HIGH (ref 0.9–3.3)
MCH: 28.8 pg (ref 27.2–33.4)
MCHC: 33.5 g/dL (ref 32.0–36.0)
MCV: 86.1 fL (ref 79.3–98.0)
Monocytes Absolute: 0.7 10*3/uL (ref 0.1–0.9)
Monocytes Relative: 7 %
Neutro Abs: 5.8 10*3/uL (ref 1.5–6.5)
Neutrophils Relative %: 56 %
Platelet Count: 244 10*3/uL (ref 140–400)
RBC: 5.11 MIL/uL (ref 4.20–5.82)
RDW: 15.9 % — ABNORMAL HIGH (ref 11.0–14.6)
WBC Count: 10.2 10*3/uL (ref 4.0–10.3)

## 2018-06-08 LAB — TSH: TSH: 6.575 u[IU]/mL — AB (ref 0.320–4.118)

## 2018-06-08 MED ORDER — AMITRIPTYLINE HCL 100 MG PO TABS: 50.0000 mg | ORAL_TABLET | Freq: Every day | ORAL | 3 refills | Status: DC

## 2018-06-08 MED ORDER — SODIUM CHLORIDE 0.9 % IV SOLN
Freq: Once | INTRAVENOUS | Status: AC
  Administered 2018-06-08: 11:00:00 via INTRAVENOUS
  Filled 2018-06-08: qty 250

## 2018-06-08 MED ORDER — SODIUM CHLORIDE 0.9 % IV SOLN
480.0000 mg | Freq: Once | INTRAVENOUS | Status: AC
  Administered 2018-06-08: 480 mg via INTRAVENOUS
  Filled 2018-06-08: qty 48
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
## 2018-06-08 NOTE — Patient Instructions (Signed)
[facility] Discharge Instructions for Patients Receiving Chemotherapy  Today you received the following chemotherapy agents nivolumab   To help prevent nausea and vomiting after your treatment, we encourage you to take your nausea medication as directed   If you develop nausea and vomiting that is not controlled by your nausea medication, call the clinic.   BELOW ARE SYMPTOMS THAT SHOULD BE REPORTED IMMEDIATELY:  *FEVER GREATER THAN 100.5 F  *CHILLS WITH OR WITHOUT FEVER  NAUSEA AND VOMITING THAT IS NOT CONTROLLED WITH YOUR NAUSEA MEDICATION  *UNUSUAL SHORTNESS OF BREATH  *UNUSUAL BRUISING OR BLEEDING  TENDERNESS IN MOUTH AND THROAT WITH OR WITHOUT PRESENCE OF ULCERS  *URINARY PROBLEMS  *BOWEL PROBLEMS  UNUSUAL RASH Items with * indicate a potential emergency and should be followed up as soon as possible.  Feel free to call the clinic you have any questions or concerns. The clinic phone number is [phone].  

## 2018-06-08 NOTE — Progress Notes (Signed)
[place] Telephone:([phone]) [phone]   [phone]) [phone]  OFFICE PROGRESS NOTE  [name], MD [place] [address]  DIAGNOSIS: Stage IV (T3, N0, M1 a) non-small cell lung cancer, squamous cell carcinoma presented with multiple bilateral pulmonary nodules diagnosed in April 2018.  PRIOR THERAPY:  Systemic chemotherapy with carboplatin for AUC of 5 and paclitaxel 175 MG/M2 every 3 weeks with Neulasta support. Status post 6 cycles.  CURRENT THERAPY: Second line treatment with immunotherapy was Nivolumab 480 mg IV every 4 weeks.  First cycle November 14, 2017.  Status post 7 cycles.  INTERVAL HISTORY: [name] [name] 67 y.o. male returns to the clinic today for follow-up visit.  The patient is feeling fine today with no specific complaints except for the persistent peripheral neuropathy.  He is currently on Neurontin 600 mg p.o. 3 times daily.  He denied having any chest pain, shortness of breath, cough or hemoptysis.  He denied having any fever or chills.  He has no nausea, vomiting, diarrhea or constipation.  He is here today for evaluation before starting cycle #8 of his treatment.  MEDICAL HISTORY: Past Medical History:  Diagnosis Date  . Brain metastases ([place]) 02/15/2017  . Depression 01/29/2017  . Dyspnea   . Encounter for antineoplastic chemotherapy 01/29/2017  . Goals of care, counseling/discussion 01/29/2017  . Hypertension   . Metastatic squamous cell carcinoma to lung, unspecified laterality ([place]) 01/28/2017  . Stroke [facility])     ALLERGIES:  is allergic to no known allergies.  MEDICATIONS:  Current Outpatient Medications  Medication Sig Dispense Refill  . acetaminophen (TYLENOL) 325 MG tablet You can take 2 every 4 hours as needed.  You can buy it over the counter DO NOT TAKE MORE THAN 4000 MG OF TYLENOL PER DAY.  IT CAN HARM YOUR LIVER.    Marland Kitchen amitriptyline (ELAVIL) 50 MG tablet Take 1 tablet (50 mg total) by mouth at bedtime. 30 tablet 3  . aspirin  EC 81 MG tablet Take 81 mg by mouth daily.    Marland Kitchen gabapentin (NEURONTIN) 300 MG capsule Take 2 capsules (600 mg total) by mouth 3 (three) times daily. 90 capsule 2  . hydrochlorothiazide (HYDRODIURIL) 25 MG tablet Restart this after you see your Primary care doctor.  Check your blood pressure at home and record.    Marland Kitchen lisinopril (PRINIVIL,ZESTRIL) 40 MG tablet Take 40 mg by mouth daily.    . metoprolol succinate (TOPROL-XL) 50 MG 24 hr tablet 50 mg.  0  . mirtazapine (REMERON) 30 MG tablet Take 1 tablet (30 mg total) by mouth at bedtime. 30 tablet 1  . pantoprazole (PROTONIX) 40 MG tablet Take 1 tablet (40 mg total) by mouth 2 (two) times daily. 60 tablet 1  . ranitidine (ZANTAC) 150 MG tablet Take 150 mg by mouth 2 (two) times daily.    . simvastatin (ZOCOR) 5 MG tablet Take 1 tablet (5 mg total) by mouth daily. 5 tablet 3  . temazepam (RESTORIL) 30 MG capsule TAKE 1 CAPSULE BY MOUTH AT BEDTIME AS NEEDED FOR SLEEP 30 capsule 0   No current facility-administered medications for this visit.     SURGICAL HISTORY:  Past Surgical History:  Procedure Laterality Date  . BACK SURGERY     cyst removal  . COLONOSCOPY    . LAPAROTOMY N/A 02/25/2017   Procedure: EXPLORATORY LAPAROTOMY MODIFIED GRAHAM'S PATCH;  Surgeon: Kieth Brightly Arta Bruce, MD;  Location: [facility];  Service: General;  Laterality:  N/A;  . MINOR PLACEMENT OF FIDUCIAL N/A 01/19/2017   Procedure: MINOR PLACEMENT OF FIDUCIAL x 6;  Surgeon: Collene Gobble, MD;  Location: [facility];  Service: Thoracic;  Laterality: N/A;  . VIDEO BRONCHOSCOPY WITH ENDOBRONCHIAL NAVIGATION N/A 01/19/2017   Procedure: VIDEO BRONCHOSCOPY WITH ENDOBRONCHIAL NAVIGATION;  Surgeon: Collene Gobble, MD;  Location: [place] [facility];  Service: Thoracic;  Laterality: N/A;    REVIEW OF SYSTEMS:  A comprehensive review of systems was negative except for: Neurological: positive for paresthesia   PHYSICAL EXAMINATION: General appearance: alert, cooperative and no distress Head: Normocephalic,  without obvious abnormality, atraumatic Neck: no adenopathy, no JVD, supple, symmetrical, trachea midline and thyroid not enlarged, symmetric, no tenderness/mass/nodules Lymph nodes: Cervical, supraclavicular, and axillary nodes normal. Resp: clear to auscultation bilaterally Back: symmetric, no curvature. ROM normal. No CVA tenderness. Cardio: regular rate and rhythm, S1, S2 normal, no murmur, click, rub or gallop GI: soft, non-tender; bowel sounds normal; no masses,  no organomegaly Extremities: extremities normal, atraumatic, no cyanosis or edema  ECOG PERFORMANCE STATUS: 1 - Symptomatic but completely ambulatory   Blood pressure (!) 148/95, pulse 70, temperature 97.8 F (36.6 C), temperature source Oral, resp. rate 18, height 5\' 9"  (1.753 m), weight 217 lb 8 oz (98.7 kg), SpO2 95 %.  LABORATORY DATA: Lab Results  Component Value Date   WBC 10.2 06/08/2018   HGB 14.7 06/08/2018   HCT 44.0 06/08/2018   MCV 86.1 06/08/2018   PLT 244 06/08/2018      Chemistry      Component Value Date/Time   NA 139 06/08/2018 0936   NA 139 10/06/2017 1052   K 4.4 06/08/2018 0936   K 4.2 10/06/2017 1052   CL 102 06/08/2018 0936   CO2 27 06/08/2018 0936   CO2 25 10/06/2017 1052   BUN 17 06/08/2018 0936   BUN 13.2 10/06/2017 1052   CREATININE 0.88 06/08/2018 0936   CREATININE 0.9 10/06/2017 1052      Component Value Date/Time   CALCIUM 9.4 06/08/2018 0936   CALCIUM 9.5 10/06/2017 1052   ALKPHOS 81 06/08/2018 0936   ALKPHOS 68 10/06/2017 1052   AST 17 06/08/2018 0936   AST 14 10/06/2017 1052   ALT 13 06/08/2018 0936   ALT 13 10/06/2017 1052   BILITOT 0.3 06/08/2018 0936   BILITOT 0.44 10/06/2017 1052       RADIOGRAPHIC STUDIES: Ct Chest W Contrast  Result Date: 05/09/2018 CLINICAL DATA:  Patient with history of lung cancer diagnosed 2017. Intermittent cough and shortness of breath. EXAM: CT CHEST, ABDOMEN, AND PELVIS WITH CONTRAST TECHNIQUE: Multidetector CT imaging of the chest,  abdomen and pelvis was performed following the standard protocol during bolus administration of intravenous contrast. CONTRAST:  144mL ISOVUE-300 IOPAMIDOL (ISOVUE-300) INJECTION 61% COMPARISON:  CT CAP 02/16/2018. FINDINGS: CT CHEST FINDINGS Cardiovascular: Normal heart size. No pericardial effusion. Thoracic aortic vascular calcifications. Mediastinum/Nodes: Normal appearance of the esophagus. Similar-appearing 9 mm precarinal lymph node (image 27; series 2). No hilar or axillary adenopathy. Lungs/Pleura: Central airways are patent. Centrilobular and paraseptal emphysematous change. Interval resolution of previously described nodular areas of consolidation within the right lower lobe compatible with resolved infectious process. New 4 mm right lower lobe nodule (image 86; series 6). Additional adjacent right lower lobe nodules measuring up to 4 mm are new in the interval (image 80; series 6). Similar-appearing 10 mm irregular nodule right upper lobe (image 47; series 6). Previously described adjacent nodule is decreased in size measuring 5 mm (image 50;  series 6), previously 7 mm. Multiple new nodules are demonstrated within the right lung apex measuring up to 6 mm (image 33; series 6). Multiple new nodules demonstrated within the left lung apex measuring up to 4-5 mm. Interval increase in size of left upper lobe nodule measuring 12 x 11 mm (image 65; series 6), previously 12 x 10 mm. Slight interval decrease in size of solid and cystic nodule left upper lobe measuring 26 x 14 mm (image 74; series 6), previously 31 x 19 mm. New 4 mm subpleural left lower lobe nodule (image 97; series 6). No pleural effusion or pneumothorax. Musculoskeletal: Thoracic spine degenerative changes. No aggressive or acute appearing osseous lesions. CT ABDOMEN PELVIS FINDINGS Hepatobiliary: Liver is normal in size and contour. No focal lesion is identified. Gallbladder is unremarkable. No intrahepatic or extrahepatic biliary ductal  dilatation. Pancreas: Unremarkable Spleen: Remarkable Adrenals/Urinary Tract: Unchanged 14 mm left adrenal nodule (image 67; series 2). The scratch the stable right adrenal gland thickening. Kidneys enhance symmetrically with contrast. No hydronephrosis. Similar-appearing urinary bladder wall thickening. Stomach/Bowel: Descending and sigmoid colonic diverticulosis. No CT evidence for acute diverticulitis. Normal morphology of the stomach. No evidence for small bowel obstruction. Vascular/Lymphatic: Dilatation of the abdominal aorta measuring 3.1 cm. Peripheral calcified noncalcified atherosclerotic plaque. No retroperitoneal lymphadenopathy. Reproductive: Prostate unremarkable. Other: Small fat containing inguinal hernias, right greater left. Musculoskeletal: Multiple complex small predominantly fat containing ventral abdominal wall hernias superior to the umbilicus. No aggressive or acute appearing osseous lesions. IMPRESSION: 1. Multiple pulmonary nodules are present. Interval development of multiple new small nodules within the upper lobes bilaterally and right lower lobe. Additionally, interval increase in size of nodule within the left upper lobe. 2. Interval resolution of previously described focal nodular areas of consolidation right lower lobe, compatible with improved infectious process. 3. No evidence for metastatic disease in the abdomen or pelvis. 4. Abdominal aortic aneurysm.  Recommend attention on follow-up Electronically Signed   By: Lovey Newcomer M.D.   On: 05/09/2018 16:10   Ct Abdomen Pelvis W Contrast  Result Date: 05/09/2018 CLINICAL DATA:  Patient with history of lung cancer diagnosed 2017. Intermittent cough and shortness of breath. EXAM: CT CHEST, ABDOMEN, AND PELVIS WITH CONTRAST TECHNIQUE: Multidetector CT imaging of the chest, abdomen and pelvis was performed following the standard protocol during bolus administration of intravenous contrast. CONTRAST:  161mL ISOVUE-300 IOPAMIDOL  (ISOVUE-300) INJECTION 61% COMPARISON:  CT CAP 02/16/2018. FINDINGS: CT CHEST FINDINGS Cardiovascular: Normal heart size. No pericardial effusion. Thoracic aortic vascular calcifications. Mediastinum/Nodes: Normal appearance of the esophagus. Similar-appearing 9 mm precarinal lymph node (image 27; series 2). No hilar or axillary adenopathy. Lungs/Pleura: Central airways are patent. Centrilobular and paraseptal emphysematous change. Interval resolution of previously described nodular areas of consolidation within the right lower lobe compatible with resolved infectious process. New 4 mm right lower lobe nodule (image 86; series 6). Additional adjacent right lower lobe nodules measuring up to 4 mm are new in the interval (image 80; series 6). Similar-appearing 10 mm irregular nodule right upper lobe (image 47; series 6). Previously described adjacent nodule is decreased in size measuring 5 mm (image 50; series 6), previously 7 mm. Multiple new nodules are demonstrated within the right lung apex measuring up to 6 mm (image 33; series 6). Multiple new nodules demonstrated within the left lung apex measuring up to 4-5 mm. Interval increase in size of left upper lobe nodule measuring 12 x 11 mm (image 65; series 6), previously 12 x 10 mm. Slight interval decrease  in size of solid and cystic nodule left upper lobe measuring 26 x 14 mm (image 74; series 6), previously 31 x 19 mm. New 4 mm subpleural left lower lobe nodule (image 97; series 6). No pleural effusion or pneumothorax. Musculoskeletal: Thoracic spine degenerative changes. No aggressive or acute appearing osseous lesions. CT ABDOMEN PELVIS FINDINGS Hepatobiliary: Liver is normal in size and contour. No focal lesion is identified. Gallbladder is unremarkable. No intrahepatic or extrahepatic biliary ductal dilatation. Pancreas: Unremarkable Spleen: Remarkable Adrenals/Urinary Tract: Unchanged 14 mm left adrenal nodule (image 67; series 2). The scratch the stable  right adrenal gland thickening. Kidneys enhance symmetrically with contrast. No hydronephrosis. Similar-appearing urinary bladder wall thickening. Stomach/Bowel: Descending and sigmoid colonic diverticulosis. No CT evidence for acute diverticulitis. Normal morphology of the stomach. No evidence for small bowel obstruction. Vascular/Lymphatic: Dilatation of the abdominal aorta measuring 3.1 cm. Peripheral calcified noncalcified atherosclerotic plaque. No retroperitoneal lymphadenopathy. Reproductive: Prostate unremarkable. Other: Small fat containing inguinal hernias, right greater left. Musculoskeletal: Multiple complex small predominantly fat containing ventral abdominal wall hernias superior to the umbilicus. No aggressive or acute appearing osseous lesions. IMPRESSION: 1. Multiple pulmonary nodules are present. Interval development of multiple new small nodules within the upper lobes bilaterally and right lower lobe. Additionally, interval increase in size of nodule within the left upper lobe. 2. Interval resolution of previously described focal nodular areas of consolidation right lower lobe, compatible with improved infectious process. 3. No evidence for metastatic disease in the abdomen or pelvis. 4. Abdominal aortic aneurysm.  Recommend attention on follow-up Electronically Signed   By: Lovey Newcomer M.D.   On: 05/09/2018 16:10    ASSESSMENT AND PLAN:  This is a very pleasant 67 years old white male with stage IV non-small cell lung cancer, squamous cell carcinoma presented with multiple bilateral pulmonary nodules. The patient was treated with systemic chemotherapy with carboplatin and paclitaxel status post 6 cycles. He tolerated the last cycle of his treatment well except for the persistent peripheral neuropathy. The patient has been in observation for the last 3 months. He had a repeat CT scan of the chest, abdomen and pelvis performed recently.  Has a scan showed mild interval enlargement of left  upper lobe as well as right upper lobe pulmonary nodules concerning for disease progression. The patient was started on treatment with immunotherapy with Nivolumab 480 mg IV every 4 weeks status post 7 cycles.   He has been tolerating this treatment well with no concerning complaints.  I recommended for him to proceed with cycle #8 today as scheduled. For the peripheral neuropathy he will continue his current treatment with Neurontin 600 mg p.o. 3 times daily. The patient will come back for follow-up visit in 4 weeks for evaluation before the next cycle of his treatment. He was advised to call immediately if he has any concerning symptoms in the interval. The patient voices understanding of current disease status and treatment options and is in agreement with the current care plan. All questions were answered. The patient knows to call the clinic with any problems, questions or concerns. We can certainly see the patient much sooner if necessary.  Disclaimer: This note was dictated with voice recognition software. Similar sounding words can inadvertently be transcribed and may not be corrected upon review.

## 2018-06-08 NOTE — Telephone Encounter (Signed)
appts already already scheduled per 8/22 los. No additional appts added.

## 2018-06-13 ENCOUNTER — Telehealth: Payer: Self-pay | Admitting: Medical Oncology

## 2018-06-13 ENCOUNTER — Other Ambulatory Visit: Payer: Self-pay | Admitting: Internal Medicine

## 2018-06-13 MED ORDER — AMITRIPTYLINE HCL 100 MG PO TABS: 100.0000 mg | ORAL_TABLET | Freq: Every day | ORAL | 3 refills | Status: DC

## 2018-06-13 NOTE — Telephone Encounter (Signed)
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

## 2018-06-13 NOTE — Telephone Encounter (Signed)
  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

## 2018-06-13 NOTE — Telephone Encounter (Signed)
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

## 2018-06-17 ENCOUNTER — Other Ambulatory Visit: Payer: Self-pay | Admitting: Internal Medicine

## 2018-07-06 ENCOUNTER — Encounter: Payer: Self-pay | Admitting: Oncology

## 2018-07-06 ENCOUNTER — Inpatient Hospital Stay (HOSPITAL_BASED_OUTPATIENT_CLINIC_OR_DEPARTMENT_OTHER): Payer: Medicare Other | Admitting: Oncology

## 2018-07-06 ENCOUNTER — Inpatient Hospital Stay: Payer: Medicare Other

## 2018-07-06 ENCOUNTER — Inpatient Hospital Stay: Payer: Medicare Other | Attending: Internal Medicine

## 2018-07-06 ENCOUNTER — Telehealth: Payer: Self-pay

## 2018-07-06 ENCOUNTER — Other Ambulatory Visit: Payer: Self-pay

## 2018-07-06 VITALS — BP 121/92 | HR 66 | Temp 97.9°F | Resp 18 | Ht 69.0 in | Wt 210.2 lb

## 2018-07-06 DIAGNOSIS — C7800 Secondary malignant neoplasm of unspecified lung: Secondary | ICD-10-CM

## 2018-07-06 DIAGNOSIS — Z5112 Encounter for antineoplastic immunotherapy: Secondary | ICD-10-CM | POA: Diagnosis not present

## 2018-07-06 DIAGNOSIS — Z79899 Other long term (current) drug therapy: Secondary | ICD-10-CM | POA: Diagnosis not present

## 2018-07-06 DIAGNOSIS — C3490 Malignant neoplasm of unspecified part of unspecified bronchus or lung: Secondary | ICD-10-CM

## 2018-07-06 DIAGNOSIS — G629 Polyneuropathy, unspecified: Secondary | ICD-10-CM | POA: Diagnosis not present

## 2018-07-06 DIAGNOSIS — K265 Chronic or unspecified duodenal ulcer with perforation: Secondary | ICD-10-CM

## 2018-07-06 LAB — CMP (CANCER CENTER ONLY)
ALT: 14 U/L (ref 0–44)
AST: 18 U/L (ref 15–41)
Albumin: 3.7 g/dL (ref 3.5–5.0)
Alkaline Phosphatase: 73 U/L (ref 38–126)
Anion gap: 9 (ref 5–15)
BILIRUBIN TOTAL: 0.3 mg/dL (ref 0.3–1.2)
BUN: 20 mg/dL (ref 8–23)
CHLORIDE: 103 mmol/L (ref 98–111)
CO2: 23 mmol/L (ref 22–32)
Calcium: 9.3 mg/dL (ref 8.9–10.3)
Creatinine: 1 mg/dL (ref 0.61–1.24)
Glucose, Bld: 106 mg/dL — ABNORMAL HIGH (ref 70–99)
POTASSIUM: 5 mmol/L (ref 3.5–5.1)
Sodium: 135 mmol/L (ref 135–145)
TOTAL PROTEIN: 7.5 g/dL (ref 6.5–8.1)

## 2018-07-06 LAB — CBC WITH DIFFERENTIAL (CANCER CENTER ONLY)
BASOS ABS: 0.1 10*3/uL (ref 0.0–0.1)
Basophils Relative: 1 %
Eosinophils Absolute: 0.1 10*3/uL (ref 0.0–0.5)
Eosinophils Relative: 1 %
HEMATOCRIT: 42.9 % (ref 38.4–49.9)
Hemoglobin: 14.3 g/dL (ref 13.0–17.1)
LYMPHS PCT: 33 %
Lymphs Abs: 2.9 10*3/uL (ref 0.9–3.3)
MCH: 29.1 pg (ref 27.2–33.4)
MCHC: 33.3 g/dL (ref 32.0–36.0)
MCV: 87.2 fL (ref 79.3–98.0)
MONO ABS: 0.8 10*3/uL (ref 0.1–0.9)
MONOS PCT: 9 %
Neutro Abs: 4.7 10*3/uL (ref 1.5–6.5)
Neutrophils Relative %: 56 %
PLATELETS: 296 10*3/uL (ref 140–400)
RBC: 4.92 MIL/uL (ref 4.20–5.82)
RDW: 16.3 % — AB (ref 11.0–14.6)
WBC Count: 8.6 10*3/uL (ref 4.0–10.3)

## 2018-07-06 LAB — TSH: TSH: 5.179 u[IU]/mL — AB (ref 0.320–4.118)

## 2018-07-06 MED ORDER — SODIUM CHLORIDE 0.9 % IV SOLN
Freq: Once | INTRAVENOUS | Status: AC
  Administered 2018-07-06: 13:00:00 via INTRAVENOUS
  Filled 2018-07-06: qty 250

## 2018-07-06 MED ORDER — SODIUM CHLORIDE 0.9 % IV SOLN
480.0000 mg | Freq: Once | INTRAVENOUS | Status: AC
  Administered 2018-07-06: 480 mg via INTRAVENOUS
  Filled 2018-07-06: qty 48

## 2018-07-06 NOTE — Progress Notes (Signed)
[name] [name] OFFICE PROGRESS NOTE  [name] Chroman, MD [place] [address]  DIAGNOSIS: Stage IV (T3, N0, M1 a) non-small cell lung cancer, squamous cell carcinoma presented with multiple bilateral pulmonary nodules diagnosed in April 2018.  PRIOR THERAPY:  Systemic chemotherapy with carboplatin for AUC of 5 and paclitaxel 175 MG/M2 every 3 weeks with Neulasta support. Status post 6 cycles.  CURRENT THERAPY: Second line treatment with immunotherapy was Nivolumab 480 mg IV every 4 weeks.  First cycle November 14, 2017.  Status post 8 cycles.  INTERVAL HISTORY: [name] [name] 67 y.o. male returns for routine follow-up visit by himself.  The patient is feeling fine and has no specific complaints except for intermittent diarrhea.  He reports that this diarrhea started when he increase the dose of Elavil so he stopped taking it.  He did not resume the lower dose that he had been on previously.  He did not take Imodium.  He denies fevers and chills.  Denies chest pain, shortness of breath, cough, hemoptysis.  Denies nausea, vomiting, constipation.  Denies recent weight loss or night sweats.  The patient is here for evaluation prior to cycle #9 of his treatment.  MEDICAL HISTORY: Past Medical History:  Diagnosis Date  . Brain metastases ([place]) 02/15/2017  . Depression 01/29/2017  . Dyspnea   . Encounter for antineoplastic chemotherapy 01/29/2017  . Goals of care, counseling/discussion 01/29/2017  . Hypertension   . Metastatic squamous cell carcinoma to lung, unspecified laterality ([place]) 01/28/2017  . Stroke [facility])     ALLERGIES:  is allergic to no known allergies.  MEDICATIONS:  Current Outpatient Medications  Medication Sig Dispense Refill  . acetaminophen (TYLENOL) 325 MG tablet You can take 2 every 4 hours as needed.  You can buy it over the counter DO NOT TAKE MORE THAN 4000 MG OF TYLENOL PER DAY.  IT CAN HARM YOUR LIVER.    Marland Kitchen aspirin EC 81 MG tablet Take 81 mg by mouth  daily.    . hydrochlorothiazide (HYDRODIURIL) 25 MG tablet Restart this after you see your Primary care doctor.  Check your blood pressure at home and record.    Marland Kitchen lisinopril (PRINIVIL,ZESTRIL) 40 MG tablet Take 40 mg by mouth daily.    . metoprolol succinate (TOPROL-XL) 50 MG 24 hr tablet 50 mg.  0  . mirtazapine (REMERON) 30 MG tablet Take 1 tablet (30 mg total) by mouth at bedtime. 30 tablet 1  . pantoprazole (PROTONIX) 40 MG tablet Take 1 tablet (40 mg total) by mouth 2 (two) times daily. 60 tablet 1  . ranitidine (ZANTAC) 150 MG tablet Take 150 mg by mouth 2 (two) times daily.    . simvastatin (ZOCOR) 5 MG tablet Take 1 tablet (5 mg total) by mouth daily. 5 tablet 3  . temazepam (RESTORIL) 30 MG capsule TAKE 1 CAPSULE BY MOUTH AT BEDTIME AS NEEDED FOR SLEEP 30 capsule 0  . amitriptyline (ELAVIL) 100 MG tablet Take 1 tablet (100 mg total) by mouth at bedtime. (Patient not taking: Reported on 07/06/2018) 30 tablet 3  . gabapentin (NEURONTIN) 300 MG capsule TAKE 2 CAPSULES(600 MG) BY MOUTH 3 TIMES DAILY 90 capsule 2   No current facility-administered medications for this visit.     SURGICAL HISTORY:  Past Surgical History:  Procedure Laterality Date  . BACK SURGERY     cyst removal  . COLONOSCOPY    . LAPAROTOMY N/A 02/25/2017   Procedure: EXPLORATORY LAPAROTOMY MODIFIED GRAHAM'S PATCH;  Surgeon: Kinsinger, Arta Bruce, MD;  Location: [facility];  Service: General;  Laterality: N/A;  . MINOR PLACEMENT OF FIDUCIAL N/A 01/19/2017   Procedure: MINOR PLACEMENT OF FIDUCIAL x 6;  Surgeon: Collene Gobble, MD;  Location: [facility];  Service: Thoracic;  Laterality: N/A;  . VIDEO BRONCHOSCOPY WITH ENDOBRONCHIAL NAVIGATION N/A 01/19/2017   Procedure: VIDEO BRONCHOSCOPY WITH ENDOBRONCHIAL NAVIGATION;  Surgeon: Collene Gobble, MD;  Location: [facility];  Service: Thoracic;  Laterality: N/A;    REVIEW OF SYSTEMS:   Review of Systems  Constitutional: Negative for appetite change, chills, fatigue, fever and unexpected  weight change.  HENT:   Negative for mouth sores, nosebleeds, sore throat and trouble swallowing.   Eyes: Negative for eye problems and icterus.  Respiratory: Negative for cough, hemoptysis, shortness of breath and wheezing.   Cardiovascular: Negative for chest pain and leg swelling.  Gastrointestinal: Negative for abdominal pain, constipation, nausea and vomiting.  Positive for intermittent diarrhea. Genitourinary: Negative for bladder incontinence, difficulty urinating, dysuria, frequency and hematuria.   Musculoskeletal: Negative for back pain, gait problem, neck pain and neck stiffness.  Skin: Negative for itching and rash.  Neurological: Negative for dizziness, extremity weakness, gait problem, headaches, light-headedness and seizures.  Hematological: Negative for adenopathy. Does not bruise/bleed easily.  Psychiatric/Behavioral: Negative for confusion, depression and sleep disturbance. The patient is not nervous/anxious.     PHYSICAL EXAMINATION:  Blood pressure (!) 121/92, pulse 66, temperature 97.9 F (36.6 C), temperature source Oral, resp. rate 18, height 5\' 9"  (1.753 m), weight 210 lb 3.2 oz (95.3 kg), SpO2 98 %.  ECOG PERFORMANCE STATUS: 1 - Symptomatic but completely ambulatory  Physical Exam  Constitutional: Oriented to person, place, and time and well-developed, well-nourished, and in no distress. No distress.  HENT:  Head: Normocephalic and atraumatic.  Mouth/Throat: Oropharynx is clear and moist. No oropharyngeal exudate.  Eyes: Conjunctivae are normal. Right eye exhibits no discharge. Left eye exhibits no discharge. No scleral icterus.  Neck: Normal range of motion. Neck supple.  Cardiovascular: Normal rate, regular rhythm, normal heart sounds and intact distal pulses.   Pulmonary/Chest: Effort normal and breath sounds normal. No respiratory distress. No wheezes. No rales.  Abdominal: Soft. Bowel sounds are normal. Exhibits no distension and no mass. There is no  tenderness.  Musculoskeletal: Normal range of motion. Exhibits no edema.  Lymphadenopathy:    No cervical adenopathy.  Neurological: Alert and oriented to person, place, and time. Exhibits normal muscle tone. Gait normal. Coordination normal.  Skin: Skin is warm and dry. No rash noted. Not diaphoretic. No erythema. No pallor.  Psychiatric: Mood, memory and judgment normal.  Vitals reviewed.  LABORATORY DATA: Lab Results  Component Value Date   WBC 8.6 07/06/2018   HGB 14.3 07/06/2018   HCT 42.9 07/06/2018   MCV 87.2 07/06/2018   PLT 296 07/06/2018      Chemistry      Component Value Date/Time   NA 139 06/08/2018 0936   NA 139 10/06/2017 1052   K 4.4 06/08/2018 0936   K 4.2 10/06/2017 1052   CL 102 06/08/2018 0936   CO2 27 06/08/2018 0936   CO2 25 10/06/2017 1052   BUN 17 06/08/2018 0936   BUN 13.2 10/06/2017 1052   CREATININE 0.88 06/08/2018 0936   CREATININE 0.9 10/06/2017 1052      Component Value Date/Time   CALCIUM 9.4 06/08/2018 0936   CALCIUM 9.5 10/06/2017 1052   ALKPHOS 81 06/08/2018 0936   ALKPHOS 68 10/06/2017 1052  AST 17 06/08/2018 0936   AST 14 10/06/2017 1052   ALT 13 06/08/2018 0936   ALT 13 10/06/2017 1052   BILITOT 0.3 06/08/2018 0936   BILITOT 0.44 10/06/2017 1052       RADIOGRAPHIC STUDIES:  No results found.   ASSESSMENT/PLAN:  Metastatic squamous cell carcinoma to lung, unspecified laterality [facility]) This is a very pleasant 67 year old white male with stage IV non-small cell lung cancer, squamous cell carcinoma presented with multiple bilateral pulmonary nodules. The patient was treated with systemic chemotherapy with carboplatin and paclitaxel status post 6 cycles. He tolerated the last cycle of his treatment well except for the persistent peripheral neuropathy. The patient has been in observation for the last 3 months. He had a repeat CT scan of the chest, abdomen and pelvis performed recently.  Has a scan showed mild interval  enlargement of left upper lobe as well as right upper lobe pulmonary nodules concerning for disease progression. The patient was started on treatment with immunotherapy with Nivolumab 480 mg IV every 4 weeks status post 8 cycles.   He has been tolerating this treatment well with no concerning complaints except for intermittent diarrhea.  I recommended for him to proceed with cycle #9 today as scheduled.  For the peripheral neuropathy he was advised to resume treatment with Elavil.  If he has difficulty tolerating this medication, I have advised him to contact Dr. Mickeal Skinner.  For diarrhea, he may use Imodium.  I have reviewed instructions with him on this today.  He is let us know if his diarrhea worsens.  He will have a restaging CT scan of the chest, abdomen, pelvis prior to his next visit.  He will follow-up in 4 weeks for evaluation prior to cycle #10 and to review his restaging CT scan results.  He was advised to call immediately if he has any concerning symptoms in the interval. The patient voices understanding of current disease status and treatment options and is in agreement with the current care plan. All questions were answered. The patient knows to call the clinic with any problems, questions or concerns. We can certainly see the patient much sooner if necessary.   Orders Placed This Encounter  Procedures  . CT ABDOMEN PELVIS W CONTRAST    Standing Status:   Future    Standing Expiration Date:   07/07/2019    Order Specific Question:   If indicated for the ordered procedure, I authorize the administration of contrast media per Radiology protocol    Answer:   Yes    Order Specific Question:   Preferred imaging location?    Answer:   [facility]    Order Specific Question:   Radiology Contrast Protocol - do NOT remove file path    Answer:   \\[email or web address]    Order Specific Question:   ** REASON FOR EXAM (FREE TEXT)    Answer:   Lung cancer.  Restaging.  . CT CHEST W CONTRAST    Pt has appt at [facility] at 11am. Please schedule scans for early am same day so that they are read in time for visit.    Standing Status:   Future    Standing Expiration Date:   07/07/2019    Order Specific Question:   If indicated for the ordered procedure, I authorize the administration of contrast media per Radiology protocol    Answer:   Yes    Order Specific Question:   Preferred imaging location?  Answer:   [facility]    Order Specific Question:   Radiology Contrast Protocol - do NOT remove file path    Answer:   \\[email or web address]    Order Specific Question:   ** REASON FOR EXAM (FREE TEXT)    Answer:   Lung cancer. Restaging.     Mikey Bussing, DNP, AGPCNP-BC, AOCNP 07/06/18

## 2018-07-06 NOTE — Patient Instructions (Signed)
[facility] Discharge Instructions for Patients Receiving Chemotherapy  Today you received the following chemotherapy agents nivolumab   To help prevent nausea and vomiting after your treatment, we encourage you to take your nausea medication as directed   If you develop nausea and vomiting that is not controlled by your nausea medication, call the clinic.   BELOW ARE SYMPTOMS THAT SHOULD BE REPORTED IMMEDIATELY:  *FEVER GREATER THAN 100.5 F  *CHILLS WITH OR WITHOUT FEVER  NAUSEA AND VOMITING THAT IS NOT CONTROLLED WITH YOUR NAUSEA MEDICATION  *UNUSUAL SHORTNESS OF BREATH  *UNUSUAL BRUISING OR BLEEDING  TENDERNESS IN MOUTH AND THROAT WITH OR WITHOUT PRESENCE OF ULCERS  *URINARY PROBLEMS  *BOWEL PROBLEMS  UNUSUAL RASH Items with * indicate a potential emergency and should be followed up as soon as possible.  Feel free to call the clinic you have any questions or concerns. The clinic phone number is [phone].  

## 2018-07-06 NOTE — Telephone Encounter (Signed)
Printed avs and calender of upcoming appointment. Per 9/19 los. Gave contrast with instructions. Moved patient labs time due to patient requested to come  For CT same day because he lives out of town.

## 2018-07-06 NOTE — Patient Instructions (Signed)
Imodium - Take 2 tabs after first loose stool each day and then 1 tablet after each additional loose stool. Maximum is 8 tabs in a 24 hour period.

## 2018-07-07 ENCOUNTER — Other Ambulatory Visit: Payer: Self-pay | Admitting: Internal Medicine

## 2018-07-07 DIAGNOSIS — C7931 Secondary malignant neoplasm of brain: Secondary | ICD-10-CM

## 2018-07-07 DIAGNOSIS — C7800 Secondary malignant neoplasm of unspecified lung: Secondary | ICD-10-CM

## 2018-07-07 DIAGNOSIS — Z5111 Encounter for antineoplastic chemotherapy: Secondary | ICD-10-CM

## 2018-07-07 NOTE — Assessment & Plan Note (Signed)
This is a very pleasant 67 year old white male with stage IV non-small cell lung cancer, squamous cell carcinoma presented with multiple bilateral pulmonary nodules. The patient was treated with systemic chemotherapy with carboplatin and paclitaxel status post 6 cycles. He tolerated the last cycle of his treatment well except for the persistent peripheral neuropathy. The patient has been in observation for the last 3 months. He had a repeat CT scan of the chest, abdomen and pelvis performed recently.  Has a scan showed mild interval enlargement of left upper lobe as well as right upper lobe pulmonary nodules concerning for disease progression. The patient was started on treatment with immunotherapy with Nivolumab 480 mg IV every 4 weeks status post 8 cycles.   He has been tolerating this treatment well with no concerning complaints except for intermittent diarrhea.  I recommended for him to proceed with cycle #9 today as scheduled.  For the peripheral neuropathy he was advised to resume treatment with Elavil.  If he has difficulty tolerating this medication, I have advised him to contact Dr. Mickeal Skinner.  For diarrhea, he may use Imodium.  I have reviewed instructions with him on this today.  He is let us know if his diarrhea worsens.  He will have a restaging CT scan of the chest, abdomen, pelvis prior to his next visit.  He will follow-up in 4 weeks for evaluation prior to cycle #10 and to review his restaging CT scan results.  He was advised to call immediately if he has any concerning symptoms in the interval. The patient voices understanding of current disease status and treatment options and is in agreement with the current care plan. All questions were answered. The patient knows to call the clinic with any problems, questions or concerns. We can certainly see the patient much sooner if necessary.

## 2018-07-10 ENCOUNTER — Other Ambulatory Visit: Payer: Self-pay | Admitting: Internal Medicine

## 2018-07-10 ENCOUNTER — Other Ambulatory Visit: Payer: Self-pay | Admitting: *Deleted

## 2018-07-10 DIAGNOSIS — C7931 Secondary malignant neoplasm of brain: Secondary | ICD-10-CM

## 2018-07-10 DIAGNOSIS — Z5111 Encounter for antineoplastic chemotherapy: Secondary | ICD-10-CM

## 2018-07-10 DIAGNOSIS — C7800 Secondary malignant neoplasm of unspecified lung: Secondary | ICD-10-CM

## 2018-07-10 NOTE — Telephone Encounter (Signed)
Pt gabapentin sent to  [facility] by MD

## 2018-07-11 ENCOUNTER — Other Ambulatory Visit: Payer: Self-pay | Admitting: Internal Medicine

## 2018-07-11 DIAGNOSIS — C7800 Secondary malignant neoplasm of unspecified lung: Secondary | ICD-10-CM

## 2018-07-11 DIAGNOSIS — Z5111 Encounter for antineoplastic chemotherapy: Secondary | ICD-10-CM

## 2018-07-11 DIAGNOSIS — C7931 Secondary malignant neoplasm of brain: Secondary | ICD-10-CM

## 2018-07-11 NOTE — Telephone Encounter (Signed)
Pt Rx for gabapentin did not go through 9/23 from MD to the  Pharmacy. Rx called into [place], [place]

## 2018-07-17 ENCOUNTER — Other Ambulatory Visit: Payer: Self-pay | Admitting: Internal Medicine

## 2018-07-17 DIAGNOSIS — C7800 Secondary malignant neoplasm of unspecified lung: Secondary | ICD-10-CM

## 2018-07-17 DIAGNOSIS — G4700 Insomnia, unspecified: Secondary | ICD-10-CM

## 2018-07-18 ENCOUNTER — Other Ambulatory Visit: Payer: Self-pay | Admitting: Internal Medicine

## 2018-07-18 ENCOUNTER — Other Ambulatory Visit: Payer: Self-pay | Admitting: Oncology

## 2018-07-18 ENCOUNTER — Other Ambulatory Visit: Payer: Self-pay | Admitting: Medical Oncology

## 2018-07-18 DIAGNOSIS — G4700 Insomnia, unspecified: Secondary | ICD-10-CM

## 2018-07-18 DIAGNOSIS — C7800 Secondary malignant neoplasm of unspecified lung: Secondary | ICD-10-CM

## 2018-07-18 NOTE — Progress Notes (Signed)
Called in remeron and restoril .

## 2018-07-20 DIAGNOSIS — R197 Diarrhea, unspecified: Secondary | ICD-10-CM | POA: Diagnosis not present

## 2018-07-21 DIAGNOSIS — R197 Diarrhea, unspecified: Secondary | ICD-10-CM | POA: Diagnosis not present

## 2018-07-27 DIAGNOSIS — R197 Diarrhea, unspecified: Secondary | ICD-10-CM | POA: Diagnosis not present

## 2018-07-31 ENCOUNTER — Telehealth: Payer: Self-pay | Admitting: *Deleted

## 2018-07-31 NOTE — Telephone Encounter (Addendum)
"  [name] [name] [phone]).  What am I to do about Wednesday's scan.  I'm having diarrhea.  Been awake ten minutes and have gone twice.  Diarrhea started two weeks ago; twice every morning and evening.  Tried Imodium AD, Pepto Bismol and Citric powder but no relief.  They can call my PCP Dr. Vickki Muff for test results.  He says my stool is negative.  I have to drink contrast Wednesday.  I don't want to kill myself to find a cure."

## 2018-07-31 NOTE — Telephone Encounter (Signed)
TCT patient regarding the diarrhea he is having.  He states he has been having diarrhea for at least a month. Denies any other symptoms. Appetite good, drinking fluids well. No pain, nausea, vomiting or fever.  Usually 2x in the morning and 2 x in the evening. He states it doesn't seem to correlate with his eating habits. Reviewed the medications he is taking for this.  He states he has tried peptoBismal, Citracel and imodium.Marland Kitchen He states he takes the imodium as directed-2 with first loose stool of the day and 1 after each loose stool after that.  Advisaed pt to not take anymore than 8 in a day.  He has spoken to his PCP about this and tooka stool sample to him.  The stool sample was 'negative' according to pt. Pt's primary concern was drinking contrast for CT Scan on Wednesday. Advised pt to take an Imodium before he drinks contrast. His scan is at 12noon on 08/02/18. Offered a [facility] appt to evaluate his diarrhea. Pt declined. He states he is coming in for his Nivolumab on Thursday, 08/03/18 and sees Mikey Bussing, NP that day as well. He states he will discuss the diarrhea issue with her that day.

## 2018-07-31 NOTE — Telephone Encounter (Signed)
Please call patient and find out how much Imodium he is taking.  Please review that he may take up to 8 tablets in a 24-hour period.  I also recommend that he come in and be evaluated in our symptom management clinic since he is currently on immunotherapy.

## 2018-08-02 ENCOUNTER — Ambulatory Visit (HOSPITAL_COMMUNITY)
Admission: RE | Admit: 2018-08-02 | Discharge: 2018-08-02 | Disposition: A | Discharge status: Home / Self Care | Payer: Medicare Other | Source: Ambulatory Visit | Attending: Oncology | Admitting: Oncology

## 2018-08-02 DIAGNOSIS — I714 Abdominal aortic aneurysm, without rupture: Secondary | ICD-10-CM | POA: Insufficient documentation

## 2018-08-02 DIAGNOSIS — C7800 Secondary malignant neoplasm of unspecified lung: Secondary | ICD-10-CM | POA: Insufficient documentation

## 2018-08-02 DIAGNOSIS — K265 Chronic or unspecified duodenal ulcer with perforation: Secondary | ICD-10-CM | POA: Insufficient documentation

## 2018-08-02 DIAGNOSIS — K6389 Other specified diseases of intestine: Secondary | ICD-10-CM | POA: Diagnosis not present

## 2018-08-02 DIAGNOSIS — C3490 Malignant neoplasm of unspecified part of unspecified bronchus or lung: Secondary | ICD-10-CM | POA: Diagnosis not present

## 2018-08-02 MED ORDER — SODIUM CHLORIDE 0.9 % IJ SOLN
INTRAMUSCULAR | Status: AC
  Filled 2018-08-02: qty 50

## 2018-08-02 MED ORDER — IOHEXOL 300 MG/ML  SOLN
100.0000 mL | Freq: Once | INTRAMUSCULAR | Status: AC | PRN
  Administered 2018-08-02: 100 mL via INTRAVENOUS

## 2018-08-03 ENCOUNTER — Encounter: Payer: Self-pay | Admitting: Oncology

## 2018-08-03 ENCOUNTER — Inpatient Hospital Stay (HOSPITAL_BASED_OUTPATIENT_CLINIC_OR_DEPARTMENT_OTHER): Payer: Medicare Other | Admitting: Oncology

## 2018-08-03 ENCOUNTER — Inpatient Hospital Stay: Payer: Medicare Other

## 2018-08-03 ENCOUNTER — Telehealth: Payer: Self-pay | Admitting: Oncology

## 2018-08-03 ENCOUNTER — Inpatient Hospital Stay: Payer: Medicare Other | Attending: Internal Medicine

## 2018-08-03 VITALS — BP 146/87 | HR 79 | Temp 98.2°F | Resp 18 | Ht 69.0 in | Wt 209.0 lb

## 2018-08-03 DIAGNOSIS — C3411 Malignant neoplasm of upper lobe, right bronchus or lung: Secondary | ICD-10-CM | POA: Insufficient documentation

## 2018-08-03 DIAGNOSIS — C3412 Malignant neoplasm of upper lobe, left bronchus or lung: Secondary | ICD-10-CM | POA: Diagnosis not present

## 2018-08-03 DIAGNOSIS — G629 Polyneuropathy, unspecified: Secondary | ICD-10-CM

## 2018-08-03 DIAGNOSIS — C7800 Secondary malignant neoplasm of unspecified lung: Secondary | ICD-10-CM

## 2018-08-03 DIAGNOSIS — Z5112 Encounter for antineoplastic immunotherapy: Secondary | ICD-10-CM

## 2018-08-03 DIAGNOSIS — Z79899 Other long term (current) drug therapy: Secondary | ICD-10-CM | POA: Diagnosis not present

## 2018-08-03 LAB — CMP (CANCER CENTER ONLY)
ALBUMIN: 3.4 g/dL — AB (ref 3.5–5.0)
ALK PHOS: 66 U/L (ref 38–126)
ALT: 11 U/L (ref 0–44)
ANION GAP: 11 (ref 5–15)
AST: 12 U/L — ABNORMAL LOW (ref 15–41)
BUN: 8 mg/dL (ref 8–23)
CALCIUM: 9.1 mg/dL (ref 8.9–10.3)
CO2: 25 mmol/L (ref 22–32)
Chloride: 105 mmol/L (ref 98–111)
Creatinine: 0.85 mg/dL (ref 0.61–1.24)
GFR, Est AFR Am: >60 mL/min (ref >60)
GFR, Estimated: >60 mL/min (ref >60)
GLUCOSE: 98 mg/dL (ref 70–99)
Potassium: 4.2 mmol/L (ref 3.5–5.1)
SODIUM: 141 mmol/L (ref 135–145)
Total Bilirubin: 0.3 mg/dL (ref 0.3–1.2)
Total Protein: 7.1 g/dL (ref 6.5–8.1)

## 2018-08-03 LAB — CBC WITH DIFFERENTIAL (CANCER CENTER ONLY)
ABS IMMATURE GRANULOCYTES: 0.01 10*3/uL (ref 0.00–0.07)
Basophils Absolute: 0.1 10*3/uL (ref 0.0–0.1)
Basophils Relative: 1 %
EOS ABS: 0.1 10*3/uL (ref 0.0–0.5)
Eosinophils Relative: 1 %
HEMATOCRIT: 44.6 % (ref 39.0–52.0)
HEMOGLOBIN: 14.6 g/dL (ref 13.0–17.0)
Immature Granulocytes: 0 %
LYMPHS ABS: 3.2 10*3/uL (ref 0.7–4.0)
Lymphocytes Relative: 41 %
MCH: 29.4 pg (ref 26.0–34.0)
MCHC: 32.7 g/dL (ref 30.0–36.0)
MCV: 89.7 fL (ref 80.0–100.0)
MONOS PCT: 12 %
Monocytes Absolute: 0.9 10*3/uL (ref 0.1–1.0)
Neutro Abs: 3.5 10*3/uL (ref 1.7–7.7)
Neutrophils Relative %: 45 %
Platelet Count: 273 10*3/uL (ref 150–400)
RBC: 4.97 MIL/uL (ref 4.22–5.81)
RDW: 16.2 % — AB (ref 11.5–15.5)
WBC Count: 7.7 10*3/uL (ref 4.0–10.5)
nRBC: 0 % (ref 0.0–0.2)

## 2018-08-03 LAB — TSH: TSH: 3.364 u[IU]/mL (ref 0.320–4.118)

## 2018-08-03 NOTE — Progress Notes (Signed)
[facility] OFFICE PROGRESS NOTE  [name], MD [place] [address]  DIAGNOSIS:Stage IV (T3, N0, M1 a) non-small cell lung cancer, squamous cell carcinoma presented with multiple bilateral pulmonary nodules diagnosed in April 2018.  PRIOR THERAPY: Systemic chemotherapy with carboplatin for AUC of 5 and paclitaxel 175 MG/M2 every 3 weeks with Neulasta support. Status post 6 cycles.  CURRENT THERAPY: Second line treatment with immunotherapy was Nivolumab 480 mg IV every 4 weeks. First cycle November 14, 2017. Status post 9cycles.  INTERVAL HISTORY: [name] [name] 67 y.o. male returns for routine follow-up visit by himself.  The patient reports that he is having increased diarrhea.  He reports that he has 3 loose stools per day.  He has been taking 3-4 Imodium per day which are not helping.  He states that he was seen by his primary care provider and had stool samples checked which were negative.  He denies fevers and chills.  Denies chest pain, shortness of breath, cough, hemoptysis.  Denies nausea, vomiting, constipation.  Denies recent weight loss or night sweats.  He had a restaging CT scan and is here to discuss the results.  MEDICAL HISTORY: Past Medical History:  Diagnosis Date  . Brain metastases ([place]) 02/15/2017  . Depression 01/29/2017  . Dyspnea   . Encounter for antineoplastic chemotherapy 01/29/2017  . Goals of care, counseling/discussion 01/29/2017  . Hypertension   . Metastatic squamous cell carcinoma to lung, unspecified laterality ([place]) 01/28/2017  . Stroke [facility])     ALLERGIES:  is allergic to no known allergies.  MEDICATIONS:  Current Outpatient Medications  Medication Sig Dispense Refill  . acetaminophen (TYLENOL) 325 MG tablet You can take 2 every 4 hours as needed.  You can buy it over the counter DO NOT TAKE MORE THAN 4000 MG OF TYLENOL PER DAY.  IT CAN HARM YOUR LIVER.    Marland Kitchen aspirin EC 81 MG tablet Take 81 mg by mouth daily.    Marland Kitchen  gabapentin (NEURONTIN) 300 MG capsule TAKE 2 CAPSULES(600 MG) BY MOUTH 3 TIMES DAILY 90 capsule 2  . hydrochlorothiazide (HYDRODIURIL) 25 MG tablet Restart this after you see your Primary care doctor.  Check your blood pressure at home and record.    Marland Kitchen lisinopril (PRINIVIL,ZESTRIL) 40 MG tablet Take 40 mg by mouth daily.    . metoprolol succinate (TOPROL-XL) 50 MG 24 hr tablet 50 mg.  0  . mirtazapine (REMERON) 30 MG tablet TAKE 1 TABLET BY MOUTH AT BEDTIME 30 tablet 1  . pantoprazole (PROTONIX) 40 MG tablet Take 1 tablet (40 mg total) by mouth 2 (two) times daily. 60 tablet 1  . ranitidine (ZANTAC) 150 MG tablet Take 150 mg by mouth 2 (two) times daily.    . simvastatin (ZOCOR) 5 MG tablet Take 1 tablet (5 mg total) by mouth daily. 5 tablet 3  . temazepam (RESTORIL) 30 MG capsule TAKE 1 CAPSULE BY MOUTH AT BEDTIME AS NEEDED FOR SLEEP 30 capsule 3  . amitriptyline (ELAVIL) 100 MG tablet Take 1 tablet (100 mg total) by mouth at bedtime. (Patient not taking: Reported on 08/03/2018) 30 tablet 3   No current facility-administered medications for this visit.     SURGICAL HISTORY:  Past Surgical History:  Procedure Laterality Date  . BACK SURGERY     cyst removal  . COLONOSCOPY    . LAPAROTOMY N/A 02/25/2017   Procedure: EXPLORATORY LAPAROTOMY MODIFIED GRAHAM'S PATCH;  Surgeon: Kieth Brightly Arta Bruce, MD;  Location: [facility];  Service: General;  Laterality: N/A;  . MINOR PLACEMENT OF FIDUCIAL N/A 01/19/2017   Procedure: MINOR PLACEMENT OF FIDUCIAL x 6;  Surgeon: Collene Gobble, MD;  Location: [facility];  Service: Thoracic;  Laterality: N/A;  . VIDEO BRONCHOSCOPY WITH ENDOBRONCHIAL NAVIGATION N/A 01/19/2017   Procedure: VIDEO BRONCHOSCOPY WITH ENDOBRONCHIAL NAVIGATION;  Surgeon: Collene Gobble, MD;  Location: [facility];  Service: Thoracic;  Laterality: N/A;    REVIEW OF SYSTEMS:   Review of Systems  Constitutional: Negative for appetite change, chills, fatigue, fever and unexpected weight change.  HENT:    Negative for mouth sores, nosebleeds, sore throat and trouble swallowing.   Eyes: Negative for eye problems and icterus.  Respiratory: Negative for cough, hemoptysis, shortness of breath and wheezing.   Cardiovascular: Negative for chest pain and leg swelling.  Gastrointestinal: Negative for abdominal pain, constipation, nausea and vomiting.  Positive for diarrhea. Genitourinary: Negative for bladder incontinence, difficulty urinating, dysuria, frequency and hematuria.   Musculoskeletal: Negative for back pain, gait problem, neck pain and neck stiffness.  Skin: Negative for itching and rash.  Neurological: Negative for dizziness, extremity weakness, gait problem, headaches, light-headedness and seizures.  Hematological: Negative for adenopathy. Does not bruise/bleed easily.  Psychiatric/Behavioral: Negative for confusion, depression and sleep disturbance. The patient is not nervous/anxious.     PHYSICAL EXAMINATION:  Blood pressure (!) 146/87, pulse 79, temperature 98.2 F (36.8 C), temperature source Oral, resp. rate 18, height 5\' 9"  (1.753 m), weight 209 lb (94.8 kg), SpO2 93 %.  ECOG PERFORMANCE STATUS: 1 - Symptomatic but completely ambulatory  Physical Exam  Constitutional: Oriented to person, place, and time and well-developed, well-nourished, and in no distress. No distress.  HENT:  Head: Normocephalic and atraumatic.  Mouth/Throat: Oropharynx is clear and moist. No oropharyngeal exudate.  Eyes: Conjunctivae are normal. Right eye exhibits no discharge. Left eye exhibits no discharge. No scleral icterus.  Neck: Normal range of motion. Neck supple.  Cardiovascular: Normal rate, regular rhythm, normal heart sounds and intact distal pulses.   Pulmonary/Chest: Effort normal and breath sounds normal. No respiratory distress. No wheezes. No rales.  Abdominal: Soft. Bowel sounds are normal. Exhibits no distension and no mass. There is no tenderness.  Musculoskeletal: Normal range of  motion. Exhibits no edema.  Lymphadenopathy:    No cervical adenopathy.  Neurological: Alert and oriented to person, place, and time. Exhibits normal muscle tone. Gait normal. Coordination normal.  Skin: Skin is warm and dry. No rash noted. Not diaphoretic. No erythema. No pallor.  Psychiatric: Mood, memory and judgment normal.  Vitals reviewed.  LABORATORY DATA: Lab Results  Component Value Date   WBC 7.7 08/03/2018   HGB 14.6 08/03/2018   HCT 44.6 08/03/2018   MCV 89.7 08/03/2018   PLT 273 08/03/2018      Chemistry      Component Value Date/Time   NA 141 08/03/2018 1043   NA 139 10/06/2017 1052   K 4.2 08/03/2018 1043   K 4.2 10/06/2017 1052   CL 105 08/03/2018 1043   CO2 25 08/03/2018 1043   CO2 25 10/06/2017 1052   BUN 8 08/03/2018 1043   BUN 13.2 10/06/2017 1052   CREATININE 0.85 08/03/2018 1043   CREATININE 0.9 10/06/2017 1052      Component Value Date/Time   CALCIUM 9.1 08/03/2018 1043   CALCIUM 9.5 10/06/2017 1052   ALKPHOS 66 08/03/2018 1043   ALKPHOS 68 10/06/2017 1052   AST 12 (L) 08/03/2018 1043   AST 14 10/06/2017  1052   ALT 11 08/03/2018 1043   ALT 13 10/06/2017 1052   BILITOT 0.3 08/03/2018 1043   BILITOT 0.44 10/06/2017 1052       RADIOGRAPHIC STUDIES:  Ct Chest W Contrast  Result Date: 08/02/2018 CLINICAL DATA:  Lung cancer EXAM: CT CHEST, ABDOMEN, AND PELVIS WITH CONTRAST TECHNIQUE: Multidetector CT imaging of the chest, abdomen and pelvis was performed following the standard protocol during bolus administration of intravenous contrast. CONTRAST:  132mL OMNIPAQUE IOHEXOL 300 MG/ML  SOLN COMPARISON:  05/09/2018 FINDINGS: CT CHEST FINDINGS Cardiovascular: The heart size is normal. No substantial pericardial effusion. Coronary artery calcification is evident. Atherosclerotic calcification is noted in the wall of the thoracic aorta. Mediastinum/Nodes: 9 mm short axis precarinal lymph node measured on the prior study is stable. No mediastinal  lymphadenopathy. There is no hilar lymphadenopathy. The esophagus has normal imaging features. There is no axillary lymphadenopathy. Lungs/Pleura: The central tracheobronchial airways are patent. Centrilobular and paraseptal emphysema evident. Numerous bilateral pulmonary nodules are again noted, many of which measure less than 5 mm. 1 of the more dominant nodules is noted in the posterior right apex, measuring 10 mm on 45/2, stable in the interval. Right upper lobe fiducial markers again noted. Dominant lesion is again noted in the posterior left upper lobe, measuring 14 x 15 mm today compared to 11 x 12 mm previously. Fiducial markers are seen in the region of this lesion, as before. No pulmonary edema or pleural effusion. Musculoskeletal: No worrisome lytic or sclerotic osseous abnormality. CT ABDOMEN PELVIS FINDINGS Hepatobiliary: No focal abnormality within the liver parenchyma. There is no evidence for gallstones, gallbladder wall thickening, or pericholecystic fluid. No intrahepatic or extrahepatic biliary dilation. Pancreas: No focal mass lesion. No dilatation of the main duct. No intraparenchymal cyst. No peripancreatic edema. Spleen: No splenomegaly. No focal mass lesion. Adrenals/Urinary Tract: Stable appearance of left adrenal thickening, measuring 13 mm today at the same level it was measured previously at 14 mm. No suspicious abnormality in either kidney. Cortical scarring in the upper pole right kidney is stable. No evidence for hydroureter. The urinary bladder appears normal for the degree of distention. Stomach/Bowel: Stomach is nondistended. No gastric wall thickening. No evidence of outlet obstruction. Duodenum is normally positioned as is the ligament of Treitz. No small bowel wall thickening. No small bowel dilatation. The terminal ileum is normal. The appendix is normal. Mild, irregular circumferential wall thickening is noted diffusely in the colon. Vascular/Lymphatic: There is abdominal  aortic atherosclerosis with infrarenal abdominal aortic aneurysm measuring 3.1 cm diameter. There is no gastrohepatic or hepatoduodenal ligament lymphadenopathy. No intraperitoneal or retroperitoneal lymphadenopathy. No pelvic sidewall lymphadenopathy. Reproductive: The prostate gland and seminal vesicles have normal imaging features. Other: No intraperitoneal free fluid. Musculoskeletal: Similar appearance of umbilical and paraumbilical hernias containing only fat. Right groin hernia contains only fat. No worrisome lytic or sclerotic osseous abnormality. IMPRESSION: 1. Interval progression of the spiculated left upper lobe pulmonary nodule concerning for neoplasm and now measuring up to 15 mm. 2. Dominant right lung nodule, seen in the upper lobe is stable in the interval. Other scattered tiny bilateral pulmonary nodules are without substantial change. 3. Diffuse circumferential colonic wall thickening suggests infectious/inflammatory colitis. 4. Infrarenal abdominal aortic aneurysm measures 3.1 cm. Continued attention on follow-up imaging recommended. Electronically Signed   By: Misty Stanley M.D.   On: 08/02/2018 15:14   Ct Abdomen Pelvis W Contrast  Result Date: 08/02/2018 CLINICAL DATA:  Lung cancer EXAM: CT CHEST, ABDOMEN, AND PELVIS WITH CONTRAST  TECHNIQUE: Multidetector CT imaging of the chest, abdomen and pelvis was performed following the standard protocol during bolus administration of intravenous contrast. CONTRAST:  123mL OMNIPAQUE IOHEXOL 300 MG/ML  SOLN COMPARISON:  05/09/2018 FINDINGS: CT CHEST FINDINGS Cardiovascular: The heart size is normal. No substantial pericardial effusion. Coronary artery calcification is evident. Atherosclerotic calcification is noted in the wall of the thoracic aorta. Mediastinum/Nodes: 9 mm short axis precarinal lymph node measured on the prior study is stable. No mediastinal lymphadenopathy. There is no hilar lymphadenopathy. The esophagus has normal imaging  features. There is no axillary lymphadenopathy. Lungs/Pleura: The central tracheobronchial airways are patent. Centrilobular and paraseptal emphysema evident. Numerous bilateral pulmonary nodules are again noted, many of which measure less than 5 mm. 1 of the more dominant nodules is noted in the posterior right apex, measuring 10 mm on 45/2, stable in the interval. Right upper lobe fiducial markers again noted. Dominant lesion is again noted in the posterior left upper lobe, measuring 14 x 15 mm today compared to 11 x 12 mm previously. Fiducial markers are seen in the region of this lesion, as before. No pulmonary edema or pleural effusion. Musculoskeletal: No worrisome lytic or sclerotic osseous abnormality. CT ABDOMEN PELVIS FINDINGS Hepatobiliary: No focal abnormality within the liver parenchyma. There is no evidence for gallstones, gallbladder wall thickening, or pericholecystic fluid. No intrahepatic or extrahepatic biliary dilation. Pancreas: No focal mass lesion. No dilatation of the main duct. No intraparenchymal cyst. No peripancreatic edema. Spleen: No splenomegaly. No focal mass lesion. Adrenals/Urinary Tract: Stable appearance of left adrenal thickening, measuring 13 mm today at the same level it was measured previously at 14 mm. No suspicious abnormality in either kidney. Cortical scarring in the upper pole right kidney is stable. No evidence for hydroureter. The urinary bladder appears normal for the degree of distention. Stomach/Bowel: Stomach is nondistended. No gastric wall thickening. No evidence of outlet obstruction. Duodenum is normally positioned as is the ligament of Treitz. No small bowel wall thickening. No small bowel dilatation. The terminal ileum is normal. The appendix is normal. Mild, irregular circumferential wall thickening is noted diffusely in the colon. Vascular/Lymphatic: There is abdominal aortic atherosclerosis with infrarenal abdominal aortic aneurysm measuring 3.1 cm  diameter. There is no gastrohepatic or hepatoduodenal ligament lymphadenopathy. No intraperitoneal or retroperitoneal lymphadenopathy. No pelvic sidewall lymphadenopathy. Reproductive: The prostate gland and seminal vesicles have normal imaging features. Other: No intraperitoneal free fluid. Musculoskeletal: Similar appearance of umbilical and paraumbilical hernias containing only fat. Right groin hernia contains only fat. No worrisome lytic or sclerotic osseous abnormality. IMPRESSION: 1. Interval progression of the spiculated left upper lobe pulmonary nodule concerning for neoplasm and now measuring up to 15 mm. 2. Dominant right lung nodule, seen in the upper lobe is stable in the interval. Other scattered tiny bilateral pulmonary nodules are without substantial change. 3. Diffuse circumferential colonic wall thickening suggests infectious/inflammatory colitis. 4. Infrarenal abdominal aortic aneurysm measures 3.1 cm. Continued attention on follow-up imaging recommended. Electronically Signed   By: Misty Stanley M.D.   On: 08/02/2018 15:14     ASSESSMENT/PLAN:  Metastatic squamous cell carcinoma to lung, unspecified laterality [facility]) This is a very pleasant 67 year old white male with stage IV non-small cell lung cancer, squamous cell carcinoma presented with multiple bilateral pulmonary nodules. The patient was treated with systemic chemotherapy with carboplatin and paclitaxel status post 6 cycles. He tolerated the last cycle of his treatment well except for the persistent peripheral neuropathy. The patient has been in observation for the  last 3 months. He had a repeat CT scan of the chest, abdomen and pelvis performed recently. Has a scan showed mild interval enlargement of left upper lobe as well as right upper lobe pulmonary nodules concerning for disease progression. The patient was started on treatment with immunotherapy with Nivolumab 480 mg IV every 4 weeks status post9cycles.  He has been  tolerating this treatment fairly well but has now developed increased diarrhea.  He had a restaging CT scan of the chest, abdomen, pelvis and is here to discuss the results.  The patient was seen with Dr. Julien Nordmann.  CT scan results were discussed with the patient which showed further progression of the left upper lobe pulmonary nodule but stable disease elsewhere.  Recommend that he be seen by radiation oncology for consideration of radiation to the left upper lobe pulmonary nodule.  Recommend that he stay on treatment with Nivolumab every 4 weeks, however, we will hold his treatment today due to diarrhea.  He will continue Imodium.  He will let us know if his diarrhea worsens and will consider giving him prednisone for immune mediated colitis.  For the peripheral neuropathy he was advised to resume treatment with Elavil.  If he has difficulty tolerating this medication, I have advised him to contact Dr. Mickeal Skinner.  The patient will follow-up in 4 weeks for evaluation prior to cycle #10.  He was advised to call immediately if he has any concerning symptoms in the interval. The patient voices understanding of current disease status and treatment options and is in agreement with the current care plan. All questions were answered. The patient knows to call the clinic with any problems, questions or concerns. We can certainly see the patient much sooner if necessary.   Orders Placed This Encounter  Procedures  . Ambulatory referral to Radiation Oncology    Referral Priority:   Routine    Referral Type:   Consultation    Referral Reason:   Specialty Services Required    Requested Specialty:   Radiation Oncology    Number of Visits Requested:   1     Mikey Bussing, DNP, AGPCNP-BC, AOCNP 08/03/18   ADDENDUM: Hematology/Oncology Attending: I had a face-to-face encounter with the patient.  I recommended his care plan.  This is a very pleasant 67 years old white male with metastatic non-small cell  lung cancer, squamous cell carcinoma status post induction systemic chemotherapy with carboplatin and paclitaxel with initial partial response followed by disease progression. Patient is currently on treatment with second line immunotherapy with Nivolumab 480 mg IV every 4 weeks status post 9 cycles.  He has been tolerating his treatment fairly well with no concerning adverse effects. He had repeat CT scan of the chest, abdomen and pelvis performed recently.  I personally and independently reviewed the scans and discussed the results with the patient today.  His a scan showed stable disease except for further progression of left upper lobe pulmonary nodule. I recommended for the patient to see radiation oncology for consideration for stereotactic radiotherapy to the progressive left upper lobe pulmonary nodule.  I will hold his treatment with immunotherapy for now until completion of this course of the treatment.  We will arrange for the patient to come back for follow-up visit in 4 weeks for evaluation and to start cycle #10 of his treatment with nivolumab.  This will also give the patient a time to recover from the questionable mild immune mediated colitis. The patient was advised to call immediately if  he has any concerning symptoms in the interval.  Disclaimer: This note was dictated with voice recognition software. Similar sounding words can inadvertently be transcribed and may be missed upon review. Eilleen Kempf, MD 08/06/18

## 2018-08-03 NOTE — Telephone Encounter (Signed)
Scheduled appt per 10/17 los - pt to get an updated schedule next visit.

## 2018-08-04 ENCOUNTER — Encounter: Payer: Self-pay | Admitting: Radiation Oncology

## 2018-08-04 DIAGNOSIS — I10 Essential (primary) hypertension: Secondary | ICD-10-CM | POA: Diagnosis not present

## 2018-08-04 DIAGNOSIS — R197 Diarrhea, unspecified: Secondary | ICD-10-CM | POA: Diagnosis not present

## 2018-08-04 DIAGNOSIS — C3490 Malignant neoplasm of unspecified part of unspecified bronchus or lung: Secondary | ICD-10-CM | POA: Diagnosis not present

## 2018-08-04 DIAGNOSIS — K219 Gastro-esophageal reflux disease without esophagitis: Secondary | ICD-10-CM | POA: Diagnosis not present

## 2018-08-04 DIAGNOSIS — F17200 Nicotine dependence, unspecified, uncomplicated: Secondary | ICD-10-CM | POA: Diagnosis not present

## 2018-08-04 NOTE — Assessment & Plan Note (Signed)
This is a very pleasant 67 year old white male with stage IV non-small cell lung cancer, squamous cell carcinoma presented with multiple bilateral pulmonary nodules. The patient was treated with systemic chemotherapy with carboplatin and paclitaxel status post 6 cycles. He tolerated the last cycle of his treatment well except for the persistent peripheral neuropathy. The patient has been in observation for the last 3 months. He had a repeat CT scan of the chest, abdomen and pelvis performed recently. Has a scan showed mild interval enlargement of left upper lobe as well as right upper lobe pulmonary nodules concerning for disease progression. The patient was started on treatment with immunotherapy with Nivolumab 480 mg IV every 4 weeks status post9cycles.  He has been tolerating this treatment fairly well but has now developed increased diarrhea.  He had a restaging CT scan of the chest, abdomen, pelvis and is here to discuss the results.  The patient was seen with Dr. Julien Nordmann.  CT scan results were discussed with the patient which showed further progression of the left upper lobe pulmonary nodule but stable disease elsewhere.  Recommend that he be seen by radiation oncology for consideration of radiation to the left upper lobe pulmonary nodule.  Recommend that he stay on treatment with Nivolumab every 4 weeks, however, we will hold his treatment today due to diarrhea.  He will continue Imodium.  He will let us know if his diarrhea worsens and will consider giving him prednisone for immune mediated colitis.  For the peripheral neuropathy he was advised to resume treatment with Elavil.  If he has difficulty tolerating this medication, I have advised him to contact Dr. Mickeal Skinner.  The patient will follow-up in 4 weeks for evaluation prior to cycle #10.  He was advised to call immediately if he has any concerning symptoms in the interval. The patient voices understanding of current disease status and  treatment options and is in agreement with the current care plan. All questions were answered. The patient knows to call the clinic with any problems, questions or concerns. We can certainly see the patient much sooner if necessary.

## 2018-08-04 NOTE — Progress Notes (Signed)
Thoracic Location of Tumor / Histology: 01/19/17:   Diagnosis 1. Lung, biopsy, Right Upper Lobe target 1 - SQUAMOUS CELL CARCINOMA. - SEE COMMENT. 2. Lung, biopsy, Right Upper Lobe target 2 - SQUAMOUS CELL CARCINOMA. - SEE COMMENT   Tobacco/Marijuana/Snuff/ETOH use:  Tobacco Use  . Smoking status: Current Every Day Smoker    Packs/day: 0.50    Years: 45.00    Pack years: 22.50    Types: Cigarettes  . Smokeless tobacco: Never Used  Substance and Sexual Activity  . Alcohol use: No  . Drug use: Yes    Types: Marijuana    Comment: daily  . Sexual activity: Not on file     Past/Anticipated interventions by cardiothoracic surgery, if any: None  Past/Anticipated interventions by medical oncology, if any: Per K. Curcio, NP 08/04/18:  This is a very pleasant 67 year old white male with stage IV non-small cell lung cancer, squamous cell carcinoma presented with multiple bilateral pulmonary nodules. The patient was treated with systemic chemotherapy with carboplatin and paclitaxel status post 6 cycles. He tolerated the last cycle of his treatment well except for the persistent peripheral neuropathy. The patient has been in observation for the last 3 months. He had a repeat CT scan of the chest, abdomen and pelvis performed recently. Has a scan showed mild interval enlargement of left upper lobe as well as right upper lobe pulmonary nodules concerning for disease progression. The patient was started on treatment with immunotherapy with Nivolumab 480 mg IV every 4 weeks status post9cycles.  He has been tolerating this treatment fairly well but has now developed increased diarrhea.  He had a restaging CT scan of the chest, abdomen, pelvis and is here to discuss the results.  The patient was seen with Dr. Julien Nordmann.  CT scan results were discussed with the patient which showed further progression of the left upper lobe pulmonary nodule but stable disease elsewhere.  Recommend that  he be seen by radiation oncology for consideration of radiation to the left upper lobe pulmonary nodule.  Recommend that he stay on treatment with Nivolumab every 4 weeks, however, we will hold his treatment today due to diarrhea.  He will continue Imodium.  He will let us know if his diarrhea worsens and will consider giving him prednisone for immune mediated colitis.  Signs/Symptoms  Weight changes, if IDP:OEUM  Respiratory complaints, if any: Patient gets short of breath with activity.  Hemoptysis, if any: None  Pain issues, if any:  None pain in his chest area  SAFETY ISSUES:  Prior radiation? No  Pacemaker/ICD? No  Possible current pregnancy? N/A, pt is male  Is the patient on methotrexate? No  Current Complaints / other details:  None Wt Readings from Last 3 Encounters:  08/09/18 207 lb 3.2 oz (94 kg)  08/03/18 209 lb (94.8 kg)  07/06/18 210 lb 3.2 oz (95.3 kg)   Vitals:   08/09/18 1327  BP: 122/76  Pulse: 66  Resp: 20  Temp: 97.6 F (36.4 C)  TempSrc: Oral  SpO2: 95%  Weight: 207 lb 3.2 oz (94 kg)  Height: 6\' 1"  (1.854 m)

## 2018-08-07 DIAGNOSIS — R197 Diarrhea, unspecified: Secondary | ICD-10-CM | POA: Diagnosis not present

## 2018-08-09 ENCOUNTER — Encounter: Payer: Self-pay | Admitting: Radiation Oncology

## 2018-08-09 ENCOUNTER — Ambulatory Visit
Admission: RE | Admit: 2018-08-09 | Discharge: 2018-08-09 | Disposition: A | Discharge status: Home / Self Care | Payer: Medicare Other | Source: Ambulatory Visit | Attending: Radiation Oncology | Admitting: Radiation Oncology

## 2018-08-09 ENCOUNTER — Other Ambulatory Visit: Payer: Self-pay

## 2018-08-09 VITALS — BP 122/76 | HR 66 | Temp 97.6°F | Resp 20 | Ht 73.0 in | Wt 207.2 lb

## 2018-08-09 DIAGNOSIS — C3492 Malignant neoplasm of unspecified part of left bronchus or lung: Secondary | ICD-10-CM | POA: Insufficient documentation

## 2018-08-09 DIAGNOSIS — Z85118 Personal history of other malignant neoplasm of bronchus and lung: Secondary | ICD-10-CM | POA: Diagnosis not present

## 2018-08-09 DIAGNOSIS — Z79899 Other long term (current) drug therapy: Secondary | ICD-10-CM | POA: Diagnosis not present

## 2018-08-09 DIAGNOSIS — C7800 Secondary malignant neoplasm of unspecified lung: Secondary | ICD-10-CM

## 2018-08-09 DIAGNOSIS — Z7982 Long term (current) use of aspirin: Secondary | ICD-10-CM | POA: Insufficient documentation

## 2018-08-09 DIAGNOSIS — Z85841 Personal history of malignant neoplasm of brain: Secondary | ICD-10-CM | POA: Diagnosis not present

## 2018-08-09 DIAGNOSIS — C7802 Secondary malignant neoplasm of left lung: Secondary | ICD-10-CM | POA: Diagnosis not present

## 2018-08-09 DIAGNOSIS — F17210 Nicotine dependence, cigarettes, uncomplicated: Secondary | ICD-10-CM | POA: Diagnosis not present

## 2018-08-09 NOTE — Progress Notes (Signed)
Radiation Oncology         [phone] ________________________________  Initial Outpatient Consultation  Name: [name] [name] MRN: [id number]  Date: 08/09/2018  DOB: Jan 10, 1951  TO:[facility]TIWP, Pcp Not In  [name], MD   REFERRING PHYSICIAN: [name], MD  DIAGNOSIS: Stage IV (T3, N0, M1a) non-small cell lung cancer, squamous cell carcinoma presented with multiple bilateral pulmonary nodules diagnosed in April 2018, now with oligometastasis in the left lung  HISTORY OF PRESENT ILLNESS::[name] [name] is a 67 y.o. male here for consideration of radiation for lung cancer. The patient was initially evaluated for pneumonia in 2018, and lung nodules were seen incidentally on chest xray.  Biopsy on 01/19/17 confirmed Stage IV squamous cell carcinoma of the lungs bilaterally. Brain MRI on 04/11/17 was negative for metastasis. He has been under the care of Dr. Julien Nordmann with immunotherapy.  Most recent chest/abdomen/pelvis CT scan on 08/02/18 showed: 1. Interval progression of the spiculated left upper lobe pulmonary nodule concerning for neoplasm and now measuring up to 15 mm. 2. Dominant right lung nodule, seen in the upper lobe is stable in the interval. Other scattered tiny bilateral pulmonary nodules are without substantial change.   PREVIOUS RADIATION THERAPY: No  PAST MEDICAL HISTORY:  has a past medical history of Brain metastases ([place]) (02/15/2017), Depression (01/29/2017), Dyspnea, Encounter for antineoplastic chemotherapy (01/29/2017), Goals of care, counseling/discussion (01/29/2017), Hypertension, Metastatic squamous cell carcinoma to lung, unspecified laterality ([place]) (01/28/2017), and Stroke ([place]).    PAST SURGICAL HISTORY: Past Surgical History:  Procedure Laterality Date  . BACK SURGERY     cyst removal  . COLONOSCOPY    . LAPAROTOMY N/A 02/25/2017   Procedure: EXPLORATORY LAPAROTOMY MODIFIED GRAHAM'S PATCH;  Surgeon: Kinsinger, Arta Bruce, MD;  Location: [facility];  Service:  General;  Laterality: N/A;  . MINOR PLACEMENT OF FIDUCIAL N/A 01/19/2017   Procedure: MINOR PLACEMENT OF FIDUCIAL x 6;  Surgeon: Collene Gobble, MD;  Location: [facility];  Service: Thoracic;  Laterality: N/A;  . VIDEO BRONCHOSCOPY WITH ENDOBRONCHIAL NAVIGATION N/A 01/19/2017   Procedure: VIDEO BRONCHOSCOPY WITH ENDOBRONCHIAL NAVIGATION;  Surgeon: Collene Gobble, MD;  Location: [facility];  Service: Thoracic;  Laterality: N/A;    FAMILY HISTORY: family history includes Cancer in his father.  SOCIAL HISTORY:  reports that he has been smoking cigarettes. He has a 22.50 pack-year smoking history. He has never used smokeless tobacco. He reports that he has current or past drug history. Drug: Marijuana. He reports that he does not drink alcohol.  ALLERGIES: No known allergies  MEDICATIONS:  Current Outpatient Medications  Medication Sig Dispense Refill  . acetaminophen (TYLENOL) 325 MG tablet You can take 2 every 4 hours as needed.  You can buy it over the counter DO NOT TAKE MORE THAN 4000 MG OF TYLENOL PER DAY.  IT CAN HARM YOUR LIVER.    Marland Kitchen amitriptyline (ELAVIL) 100 MG tablet Take 1 tablet (100 mg total) by mouth at bedtime. 30 tablet 3  . gabapentin (NEURONTIN) 300 MG capsule TAKE 2 CAPSULES(600 MG) BY MOUTH 3 TIMES DAILY 90 capsule 2  . hydrochlorothiazide (HYDRODIURIL) 25 MG tablet Restart this after you see your Primary care doctor.  Check your blood pressure at home and record.    Marland Kitchen lisinopril (PRINIVIL,ZESTRIL) 40 MG tablet Take 40 mg by mouth daily.    . metoprolol succinate (TOPROL-XL) 50 MG 24 hr tablet 50 mg.  0  . mirtazapine (REMERON) 30 MG tablet TAKE 1 TABLET BY MOUTH AT BEDTIME 30  tablet 1  . simvastatin (ZOCOR) 5 MG tablet Take 1 tablet (5 mg total) by mouth daily. 5 tablet 3  . temazepam (RESTORIL) 30 MG capsule TAKE 1 CAPSULE BY MOUTH AT BEDTIME AS NEEDED FOR SLEEP 30 capsule 3  . aspirin EC 81 MG tablet Take 81 mg by mouth daily.    . pantoprazole (PROTONIX) 40 MG tablet Take 1 tablet  (40 mg total) by mouth 2 (two) times daily. (Patient not taking: Reported on 08/09/2018) 60 tablet 1  . ranitidine (ZANTAC) 150 MG tablet Take 150 mg by mouth 2 (two) times daily.     No current facility-administered medications for this encounter.     REVIEW OF SYSTEMS:  A 10+ POINT REVIEW OF SYSTEMS WAS OBTAINED including neurology, dermatology, psychiatry, cardiac, respiratory, lymph, extremities, GI, GU, musculoskeletal, constitutional, reproductive, HEENT. He denies hemoptysis, chest pain. He notes occasional cough and shortness of breath.   PHYSICAL EXAM:  height is 6\' 1"  (1.854 m) and weight is 207 lb 3.2 oz (94 kg). His oral temperature is 97.6 F (36.4 C). His blood pressure is 122/76 and his pulse is 66. His respiration is 20 and oxygen saturation is 95%.   General: Alert and oriented, in no acute distress HEENT: Head is normocephalic. Extraocular movements are intact. Oropharynx is clear. Neck: Neck is supple, no palpable cervical or supraclavicular lymphadenopathy. Heart: Regular in rate and rhythm with no murmurs, rubs, or gallops. Chest: Clear to auscultation bilaterally, with no rhonchi, wheezes, or rales. Abdomen: Soft, nontender, nondistended, with no rigidity or guarding. Extremities: No cyanosis or edema. Lymphatics: see Neck Exam Skin: No concerning lesions. Musculoskeletal: symmetric strength and muscle tone throughout. Neurologic: Cranial nerves II through XII are grossly intact. No obvious focalities. Speech is fluent. Coordination is intact. Psychiatric: Judgment and insight are intact. Affect is appropriate.   ECOG = 1  0 - Asymptomatic (Fully active, able to carry on all predisease activities without restriction)  1 - Symptomatic but completely ambulatory (Restricted in physically strenuous activity but ambulatory and able to carry out work of a light or sedentary nature. For example, light housework, office work)  2 - Symptomatic, <50% in bed during the day  (Ambulatory and capable of all self care but unable to carry out any work activities. Up and about more than 50% of waking hours)  3 - Symptomatic, >50% in bed, but not bedbound (Capable of only limited self-care, confined to bed or chair 50% or more of waking hours)  4 - Bedbound (Completely disabled. Cannot carry on any self-care. Totally confined to bed or chair)  5 - Death   Eustace Pen MM, Creech RH, Tormey DC, et al. [phone]). "Toxicity and response criteria of the [facility]". [place] Oncol. 5 (6): 649-55  LABORATORY DATA:  Lab Results  Component Value Date   WBC 7.7 08/03/2018   HGB 14.6 08/03/2018   HCT 44.6 08/03/2018   MCV 89.7 08/03/2018   PLT 273 08/03/2018   NEUTROABS 3.5 08/03/2018   Lab Results  Component Value Date   NA 141 08/03/2018   K 4.2 08/03/2018   CL 105 08/03/2018   CO2 25 08/03/2018   GLUCOSE 98 08/03/2018   CREATININE 0.85 08/03/2018   CALCIUM 9.1 08/03/2018      RADIOGRAPHY: Ct Chest W Contrast  Result Date: 08/02/2018 CLINICAL DATA:  Lung cancer EXAM: CT CHEST, ABDOMEN, AND PELVIS WITH CONTRAST TECHNIQUE: Multidetector CT imaging of the chest, abdomen and pelvis was performed following the standard  protocol during bolus administration of intravenous contrast. CONTRAST:  156mL OMNIPAQUE IOHEXOL 300 MG/ML  SOLN COMPARISON:  05/09/2018 FINDINGS: CT CHEST FINDINGS Cardiovascular: The heart size is normal. No substantial pericardial effusion. Coronary artery calcification is evident. Atherosclerotic calcification is noted in the wall of the thoracic aorta. Mediastinum/Nodes: 9 mm short axis precarinal lymph node measured on the prior study is stable. No mediastinal lymphadenopathy. There is no hilar lymphadenopathy. The esophagus has normal imaging features. There is no axillary lymphadenopathy. Lungs/Pleura: The central tracheobronchial airways are patent. Centrilobular and paraseptal emphysema evident. Numerous bilateral pulmonary  nodules are again noted, many of which measure less than 5 mm. 1 of the more dominant nodules is noted in the posterior right apex, measuring 10 mm on 45/2, stable in the interval. Right upper lobe fiducial markers again noted. Dominant lesion is again noted in the posterior left upper lobe, measuring 14 x 15 mm today compared to 11 x 12 mm previously. Fiducial markers are seen in the region of this lesion, as before. No pulmonary edema or pleural effusion. Musculoskeletal: No worrisome lytic or sclerotic osseous abnormality. CT ABDOMEN PELVIS FINDINGS Hepatobiliary: No focal abnormality within the liver parenchyma. There is no evidence for gallstones, gallbladder wall thickening, or pericholecystic fluid. No intrahepatic or extrahepatic biliary dilation. Pancreas: No focal mass lesion. No dilatation of the main duct. No intraparenchymal cyst. No peripancreatic edema. Spleen: No splenomegaly. No focal mass lesion. Adrenals/Urinary Tract: Stable appearance of left adrenal thickening, measuring 13 mm today at the same level it was measured previously at 14 mm. No suspicious abnormality in either kidney. Cortical scarring in the upper pole right kidney is stable. No evidence for hydroureter. The urinary bladder appears normal for the degree of distention. Stomach/Bowel: Stomach is nondistended. No gastric wall thickening. No evidence of outlet obstruction. Duodenum is normally positioned as is the ligament of Treitz. No small bowel wall thickening. No small bowel dilatation. The terminal ileum is normal. The appendix is normal. Mild, irregular circumferential wall thickening is noted diffusely in the colon. Vascular/Lymphatic: There is abdominal aortic atherosclerosis with infrarenal abdominal aortic aneurysm measuring 3.1 cm diameter. There is no gastrohepatic or hepatoduodenal ligament lymphadenopathy. No intraperitoneal or retroperitoneal lymphadenopathy. No pelvic sidewall lymphadenopathy. Reproductive: The  prostate gland and seminal vesicles have normal imaging features. Other: No intraperitoneal free fluid. Musculoskeletal: Similar appearance of umbilical and paraumbilical hernias containing only fat. Right groin hernia contains only fat. No worrisome lytic or sclerotic osseous abnormality. IMPRESSION: 1. Interval progression of the spiculated left upper lobe pulmonary nodule concerning for neoplasm and now measuring up to 15 mm. 2. Dominant right lung nodule, seen in the upper lobe is stable in the interval. Other scattered tiny bilateral pulmonary nodules are without substantial change. 3. Diffuse circumferential colonic wall thickening suggests infectious/inflammatory colitis. 4. Infrarenal abdominal aortic aneurysm measures 3.1 cm. Continued attention on follow-up imaging recommended. Electronically Signed   By: Misty Stanley M.D.   On: 08/02/2018 15:14   Ct Abdomen Pelvis W Contrast  Result Date: 08/02/2018 CLINICAL DATA:  Lung cancer EXAM: CT CHEST, ABDOMEN, AND PELVIS WITH CONTRAST TECHNIQUE: Multidetector CT imaging of the chest, abdomen and pelvis was performed following the standard protocol during bolus administration of intravenous contrast. CONTRAST:  169mL OMNIPAQUE IOHEXOL 300 MG/ML  SOLN COMPARISON:  05/09/2018 FINDINGS: CT CHEST FINDINGS Cardiovascular: The heart size is normal. No substantial pericardial effusion. Coronary artery calcification is evident. Atherosclerotic calcification is noted in the wall of the thoracic aorta. Mediastinum/Nodes: 9 mm short axis precarinal  lymph node measured on the prior study is stable. No mediastinal lymphadenopathy. There is no hilar lymphadenopathy. The esophagus has normal imaging features. There is no axillary lymphadenopathy. Lungs/Pleura: The central tracheobronchial airways are patent. Centrilobular and paraseptal emphysema evident. Numerous bilateral pulmonary nodules are again noted, many of which measure less than 5 mm. 1 of the more dominant  nodules is noted in the posterior right apex, measuring 10 mm on 45/2, stable in the interval. Right upper lobe fiducial markers again noted. Dominant lesion is again noted in the posterior left upper lobe, measuring 14 x 15 mm today compared to 11 x 12 mm previously. Fiducial markers are seen in the region of this lesion, as before. No pulmonary edema or pleural effusion. Musculoskeletal: No worrisome lytic or sclerotic osseous abnormality. CT ABDOMEN PELVIS FINDINGS Hepatobiliary: No focal abnormality within the liver parenchyma. There is no evidence for gallstones, gallbladder wall thickening, or pericholecystic fluid. No intrahepatic or extrahepatic biliary dilation. Pancreas: No focal mass lesion. No dilatation of the main duct. No intraparenchymal cyst. No peripancreatic edema. Spleen: No splenomegaly. No focal mass lesion. Adrenals/Urinary Tract: Stable appearance of left adrenal thickening, measuring 13 mm today at the same level it was measured previously at 14 mm. No suspicious abnormality in either kidney. Cortical scarring in the upper pole right kidney is stable. No evidence for hydroureter. The urinary bladder appears normal for the degree of distention. Stomach/Bowel: Stomach is nondistended. No gastric wall thickening. No evidence of outlet obstruction. Duodenum is normally positioned as is the ligament of Treitz. No small bowel wall thickening. No small bowel dilatation. The terminal ileum is normal. The appendix is normal. Mild, irregular circumferential wall thickening is noted diffusely in the colon. Vascular/Lymphatic: There is abdominal aortic atherosclerosis with infrarenal abdominal aortic aneurysm measuring 3.1 cm diameter. There is no gastrohepatic or hepatoduodenal ligament lymphadenopathy. No intraperitoneal or retroperitoneal lymphadenopathy. No pelvic sidewall lymphadenopathy. Reproductive: The prostate gland and seminal vesicles have normal imaging features. Other: No intraperitoneal  free fluid. Musculoskeletal: Similar appearance of umbilical and paraumbilical hernias containing only fat. Right groin hernia contains only fat. No worrisome lytic or sclerotic osseous abnormality. IMPRESSION: 1. Interval progression of the spiculated left upper lobe pulmonary nodule concerning for neoplasm and now measuring up to 15 mm. 2. Dominant right lung nodule, seen in the upper lobe is stable in the interval. Other scattered tiny bilateral pulmonary nodules are without substantial change. 3. Diffuse circumferential colonic wall thickening suggests infectious/inflammatory colitis. 4. Infrarenal abdominal aortic aneurysm measures 3.1 cm. Continued attention on follow-up imaging recommended. Electronically Signed   By: Misty Stanley M.D.   On: 08/02/2018 15:14      IMPRESSION: Stage IV (T3, N0, M1a) non-small cell lung cancer, squamous cell carcinoma presented with multiple bilateral pulmonary nodules diagnosed in April 2018, now with oligometastasis in the left lung  Patient would be a good candidate for SBRT directed at his enlarging lesion in the left upper lobe.  Today, I talked to the patient  about the findings and work-up thus far.  We discussed the natural history of lung cancer and general treatment, highlighting the role of radiotherapy especially SBRT in the management.  We discussed the available radiation techniques, and focused on the details of logistics and delivery.  We reviewed the anticipated acute and late sequelae associated with radiation in this setting.  The patient was encouraged to ask questions that I answered to the best of my ability.  A patient consent form was discussed and signed.  We retained a copy for our records.  The patient would like to proceed with radiation and will be scheduled for CT simulation.  PLAN: Patient will return next week for CT simulation on Thursday, 08/17/18 at 2 pm. Anticipate 3 treatments using SBRT techniques.      ------------------------------------------------  Blair Promise, PhD, MD  This document serves as a record of services personally performed by [name], MD. It was created on his behalf by Wilburn Mylar, a trained medical scribe. The creation of this record is based on the scribe's personal observations and the provider's statements to them. This document has been checked and approved by the attending provider.

## 2018-08-10 ENCOUNTER — Ambulatory Visit: Payer: Medicare Other | Admitting: Radiation Oncology

## 2018-08-15 ENCOUNTER — Telehealth: Payer: Self-pay | Admitting: Medical Oncology

## 2018-08-15 ENCOUNTER — Telehealth: Payer: Self-pay | Admitting: Diagnostic Neuroimaging

## 2018-08-15 NOTE — Telephone Encounter (Signed)
Spoke with patient and informed him that this RN cannot find any documentation of Dr Leta Baptist taking him out of work. Advised he call his PCP to discuss. He stated that Dr Earlie Server told him Dr Leta Baptist had taken him out and advised he call this office. This RN again advised he discuss with his PCP and stated she will discuss with Dr Leta Baptist as well. This RN advised will call him back after talking with Dr Leta Baptist. He verbalized understanding, appreciation.

## 2018-08-15 NOTE — Telephone Encounter (Signed)
Pt wants to go back to work . He said he  went out of work in early 2018 and maybe march 2018. " I cannot stand not working because I have bills to pay". He was told that " My neurologist t did not take me out of work". I gave pt Dr Vaughan Browner number to call to see if he took him out since he saw pt around March. I also forwarded this note to Ivin Poot to see if she has paperwork on his work status.

## 2018-08-15 NOTE — Telephone Encounter (Signed)
Patient should follow up with oncology and PCP. -VRP

## 2018-08-15 NOTE — Telephone Encounter (Signed)
Pt has called and is asking for a letter from Dr Leta Baptist re: him being able to return to work.  Pt is asking for a call to discuss this request

## 2018-08-16 ENCOUNTER — Other Ambulatory Visit: Payer: Self-pay | Admitting: Medical Oncology

## 2018-08-16 DIAGNOSIS — C7800 Secondary malignant neoplasm of unspecified lung: Secondary | ICD-10-CM

## 2018-08-16 MED ORDER — MIRTAZAPINE 30 MG PO TABS: 30.0000 mg | ORAL_TABLET | Freq: Every day | ORAL | 1 refills | Status: DC

## 2018-08-16 NOTE — Telephone Encounter (Addendum)
Spoke with patient and advised he discuss work release with his oncologist and his PCP. He stated he saw Dr Julien Nordmann, oncologist last week, and he told patient he could work. He stated he needs a letter from all his doctors. This RN informed him that when Dr Leta Baptist saw him in Jan he released him to the care of his PCP and oncologist. Advised he call his PCP. He stated he has moved to [place], has a new PCP. He asked which PCP to call, the one in [place] or [place]. This RN asked if he is established with new PCP; he stated he has. This RN advised he call the new PCP in [place] and his work place to find out their requirements.  He verbalized understanding, appreciation.

## 2018-08-16 NOTE — Telephone Encounter (Signed)
I have no records on this patient. He will need to contact the provider that wrote him out for disability.

## 2018-08-17 ENCOUNTER — Ambulatory Visit
Admission: RE | Admit: 2018-08-17 | Discharge: 2018-08-17 | Disposition: A | Discharge status: Home / Self Care | Payer: Medicare Other | Source: Ambulatory Visit | Attending: Radiation Oncology | Admitting: Radiation Oncology

## 2018-08-17 ENCOUNTER — Telehealth: Payer: Self-pay | Admitting: Medical Oncology

## 2018-08-17 ENCOUNTER — Other Ambulatory Visit: Payer: Self-pay | Admitting: Internal Medicine

## 2018-08-17 DIAGNOSIS — C3492 Malignant neoplasm of unspecified part of left bronchus or lung: Secondary | ICD-10-CM | POA: Insufficient documentation

## 2018-08-17 DIAGNOSIS — F17210 Nicotine dependence, cigarettes, uncomplicated: Secondary | ICD-10-CM | POA: Insufficient documentation

## 2018-08-17 DIAGNOSIS — C7800 Secondary malignant neoplasm of unspecified lung: Secondary | ICD-10-CM

## 2018-08-17 DIAGNOSIS — C7802 Secondary malignant neoplasm of left lung: Secondary | ICD-10-CM | POA: Insufficient documentation

## 2018-08-17 DIAGNOSIS — G4700 Insomnia, unspecified: Secondary | ICD-10-CM

## 2018-08-17 NOTE — Telephone Encounter (Signed)
Pt notified : 1. Restoril refill -done yesterday  2. Continue nivolumab with XRT- keep appts as scheduled. 3. Return to work - needs to call PCP.

## 2018-08-17 NOTE — Progress Notes (Signed)
  Radiation Oncology         [phone] ________________________________  Name: [name] [name] MRN: [id number]  Date: 08/17/2018  DOB: 06-06-1951   STEREOTACTIC BODY RADIOTHERAPY SIMULATION AND TREATMENT PLANNING NOTE    DIAGNOSIS:   Stage IV (T3, N0, M1a) non-small cell lung cancer, squamous cell carcinoma presented with multiple bilateral pulmonary nodules diagnosed in April 2018, now with oligometastasis in the left lung  NARRATIVE:  The patient was brought to the [facility].  Identity was confirmed.  All relevant records and images related to the planned course of therapy were reviewed.  The patient freely provided informed written consent to proceed with treatment after reviewing the details related to the planned course of therapy. The consent form was witnessed and verified by the simulation staff.  Then, the patient was set-up in a stable reproducible  supine position for radiation therapy.  A BodyFix immobilization pillow was fabricated for reproducible positioning.  Then I personally applied the abdominal compression paddle to limit respiratory excursion.  4D respiratoy motion management CT images were obtained.  Surface markings were placed.  The CT images were loaded into the planning software.  Then, using Cine, MIP, and standard views, the internal target volume (ITV) and planning target volumes (PTV) were delinieated, and avoidance structures were contoured.  Treatment planning then occurred.  The radiation prescription was entered and confirmed.  A total of two complex treatment devices were fabricated in the form of the BodyFix immobilization pillow and a neck accuform cushion.  I have requested : 3D Simulation  I have requested a DVH of the following structures: Heart, Lungs, Esophagus, Chest Wall, Brachial Plexus, Major Blood Vessels, and targets.  PLAN:  The patient will receive 54 Gy in 3 fractions.  -----------------------------------  Blair Promise, PhD,  MD

## 2018-08-17 NOTE — Telephone Encounter (Signed)
The only thing I could suggest is to get Dr. Julien Nordmann to write a note saying he is able to return to work.

## 2018-08-18 DIAGNOSIS — Z923 Personal history of irradiation: Secondary | ICD-10-CM

## 2018-08-18 HISTORY — DX: Personal history of irradiation: Z92.3

## 2018-08-23 DIAGNOSIS — C7802 Secondary malignant neoplasm of left lung: Secondary | ICD-10-CM | POA: Insufficient documentation

## 2018-08-23 DIAGNOSIS — C3492 Malignant neoplasm of unspecified part of left bronchus or lung: Secondary | ICD-10-CM | POA: Diagnosis not present

## 2018-08-23 DIAGNOSIS — F17210 Nicotine dependence, cigarettes, uncomplicated: Secondary | ICD-10-CM | POA: Insufficient documentation

## 2018-08-28 ENCOUNTER — Telehealth: Payer: Self-pay | Admitting: Medical Oncology

## 2018-08-28 NOTE — Telephone Encounter (Signed)
Pt called -he is being treated for C difficile with a med  Called DIFICID 200 mg. I told him to keep appts this week.

## 2018-08-29 ENCOUNTER — Ambulatory Visit
Admission: RE | Admit: 2018-08-29 | Discharge: 2018-08-29 | Disposition: A | Discharge status: Home / Self Care | Payer: Medicare Other | Source: Ambulatory Visit | Attending: Radiation Oncology | Admitting: Radiation Oncology

## 2018-08-29 DIAGNOSIS — C7800 Secondary malignant neoplasm of unspecified lung: Secondary | ICD-10-CM

## 2018-08-29 DIAGNOSIS — C3492 Malignant neoplasm of unspecified part of left bronchus or lung: Secondary | ICD-10-CM | POA: Diagnosis not present

## 2018-08-29 DIAGNOSIS — C7802 Secondary malignant neoplasm of left lung: Secondary | ICD-10-CM | POA: Diagnosis not present

## 2018-08-29 DIAGNOSIS — F17210 Nicotine dependence, cigarettes, uncomplicated: Secondary | ICD-10-CM | POA: Diagnosis not present

## 2018-08-29 NOTE — Progress Notes (Signed)
  Radiation Oncology         [phone] ________________________________  Name: [name] [name] MRN: [id number]  Date: 08/29/2018  DOB: 1951-06-08  Stereotactic Body Radiotherapy Treatment Procedure Note  NARRATIVE:  [name] [name] was brought to the stereotactic radiation treatment machine and placed supine on the CT couch. The patient was set up for stereotactic body radiotherapy on the body fix pillow.  3D TREATMENT PLANNING AND DOSIMETRY:  The patient's radiation plan was reviewed and approved prior to starting treatment.  It showed 3-dimensional radiation distributions overlaid onto the planning CT.  The [facility] for the target structures as well as the organs at risk were reviewed. The documentation of this is filed in the radiation oncology EMR.  SIMULATION VERIFICATION:  The patient underwent CT imaging on the treatment unit.  These were carefully aligned to document that the ablative radiation dose would cover the target volume and maximally spare the nearby organs at risk according to the planned distribution.  SPECIAL TREATMENT PROCEDURE: [name] [name] received high dose ablative stereotactic body radiotherapy to the planned target volume without unforeseen complications. Treatment was delivered uneventfully. The high doses associated with stereotactic body radiotherapy and the significant potential risks require careful treatment set up and patient monitoring constituting a special treatment procedure   STEREOTACTIC TREATMENT MANAGEMENT:  Following delivery, the patient was evaluated clinically. The patient tolerated treatment without significant acute effects, and was discharged to home in stable condition.    PLAN: Continue treatment as planned.  ________________________________  Blair Promise, PhD, MD

## 2018-08-30 ENCOUNTER — Other Ambulatory Visit: Payer: Self-pay | Admitting: Internal Medicine

## 2018-08-30 DIAGNOSIS — Z85118 Personal history of other malignant neoplasm of bronchus and lung: Secondary | ICD-10-CM | POA: Diagnosis not present

## 2018-08-30 DIAGNOSIS — K219 Gastro-esophageal reflux disease without esophagitis: Secondary | ICD-10-CM | POA: Diagnosis not present

## 2018-08-30 DIAGNOSIS — E785 Hyperlipidemia, unspecified: Secondary | ICD-10-CM | POA: Diagnosis not present

## 2018-08-30 DIAGNOSIS — F17200 Nicotine dependence, unspecified, uncomplicated: Secondary | ICD-10-CM | POA: Diagnosis not present

## 2018-08-30 DIAGNOSIS — J449 Chronic obstructive pulmonary disease, unspecified: Secondary | ICD-10-CM | POA: Diagnosis not present

## 2018-08-30 DIAGNOSIS — G4700 Insomnia, unspecified: Secondary | ICD-10-CM | POA: Diagnosis not present

## 2018-08-30 DIAGNOSIS — A0472 Enterocolitis due to Clostridium difficile, not specified as recurrent: Secondary | ICD-10-CM | POA: Diagnosis not present

## 2018-08-30 DIAGNOSIS — Z1159 Encounter for screening for other viral diseases: Secondary | ICD-10-CM | POA: Diagnosis not present

## 2018-08-30 DIAGNOSIS — I10 Essential (primary) hypertension: Secondary | ICD-10-CM | POA: Diagnosis not present

## 2018-08-30 DIAGNOSIS — G63 Polyneuropathy in diseases classified elsewhere: Secondary | ICD-10-CM | POA: Diagnosis not present

## 2018-08-30 DIAGNOSIS — E871 Hypo-osmolality and hyponatremia: Secondary | ICD-10-CM | POA: Diagnosis not present

## 2018-08-30 DIAGNOSIS — R7989 Other specified abnormal findings of blood chemistry: Secondary | ICD-10-CM | POA: Diagnosis not present

## 2018-08-31 ENCOUNTER — Encounter: Payer: Self-pay | Admitting: Oncology

## 2018-08-31 ENCOUNTER — Inpatient Hospital Stay (HOSPITAL_BASED_OUTPATIENT_CLINIC_OR_DEPARTMENT_OTHER): Payer: Medicare Other | Admitting: Oncology

## 2018-08-31 ENCOUNTER — Ambulatory Visit
Admission: RE | Admit: 2018-08-31 | Discharge: 2018-08-31 | Disposition: A | Discharge status: Home / Self Care | Payer: Medicare Other | Source: Ambulatory Visit | Attending: Radiation Oncology | Admitting: Radiation Oncology

## 2018-08-31 ENCOUNTER — Inpatient Hospital Stay: Payer: Medicare Other

## 2018-08-31 ENCOUNTER — Inpatient Hospital Stay: Payer: Medicare Other | Attending: Internal Medicine

## 2018-08-31 VITALS — BP 128/82 | HR 63 | Temp 98.0°F | Resp 18 | Ht 73.0 in | Wt 209.5 lb

## 2018-08-31 DIAGNOSIS — Z5112 Encounter for antineoplastic immunotherapy: Secondary | ICD-10-CM

## 2018-08-31 DIAGNOSIS — C3411 Malignant neoplasm of upper lobe, right bronchus or lung: Secondary | ICD-10-CM

## 2018-08-31 DIAGNOSIS — G620 Drug-induced polyneuropathy: Secondary | ICD-10-CM

## 2018-08-31 DIAGNOSIS — C7800 Secondary malignant neoplasm of unspecified lung: Secondary | ICD-10-CM

## 2018-08-31 DIAGNOSIS — F17210 Nicotine dependence, cigarettes, uncomplicated: Secondary | ICD-10-CM | POA: Diagnosis not present

## 2018-08-31 DIAGNOSIS — Z79899 Other long term (current) drug therapy: Secondary | ICD-10-CM

## 2018-08-31 DIAGNOSIS — C3412 Malignant neoplasm of upper lobe, left bronchus or lung: Secondary | ICD-10-CM

## 2018-08-31 DIAGNOSIS — C3492 Malignant neoplasm of unspecified part of left bronchus or lung: Secondary | ICD-10-CM | POA: Diagnosis not present

## 2018-08-31 DIAGNOSIS — C7802 Secondary malignant neoplasm of left lung: Secondary | ICD-10-CM | POA: Diagnosis not present

## 2018-08-31 LAB — CBC WITH DIFFERENTIAL (CANCER CENTER ONLY)
ABS IMMATURE GRANULOCYTES: 0.03 10*3/uL (ref 0.00–0.07)
BASOS ABS: 0.1 10*3/uL (ref 0.0–0.1)
BASOS PCT: 1 %
Eosinophils Absolute: 0.1 10*3/uL (ref 0.0–0.5)
Eosinophils Relative: 1 %
HCT: 43.2 % (ref 39.0–52.0)
Hemoglobin: 14 g/dL (ref 13.0–17.0)
IMMATURE GRANULOCYTES: 0 %
Lymphocytes Relative: 40 %
Lymphs Abs: 3.7 10*3/uL (ref 0.7–4.0)
MCH: 29.2 pg (ref 26.0–34.0)
MCHC: 32.4 g/dL (ref 30.0–36.0)
MCV: 90 fL (ref 80.0–100.0)
Monocytes Absolute: 0.9 10*3/uL (ref 0.1–1.0)
Monocytes Relative: 9 %
NEUTROS PCT: 49 %
NRBC: 0 % (ref 0.0–0.2)
Neutro Abs: 4.4 10*3/uL (ref 1.7–7.7)
PLATELETS: 269 10*3/uL (ref 150–400)
RBC: 4.8 MIL/uL (ref 4.22–5.81)
RDW: 14.6 % (ref 11.5–15.5)
WBC: 9.1 10*3/uL (ref 4.0–10.5)

## 2018-08-31 LAB — CMP (CANCER CENTER ONLY)
ALBUMIN: 3.3 g/dL — AB (ref 3.5–5.0)
ALT: 11 U/L (ref 0–44)
ANION GAP: 7 (ref 5–15)
AST: 13 U/L — ABNORMAL LOW (ref 15–41)
Alkaline Phosphatase: 64 U/L (ref 38–126)
BILIRUBIN TOTAL: 0.4 mg/dL (ref 0.3–1.2)
BUN: 10 mg/dL (ref 8–23)
CO2: 25 mmol/L (ref 22–32)
Calcium: 8.7 mg/dL — ABNORMAL LOW (ref 8.9–10.3)
Chloride: 104 mmol/L (ref 98–111)
Creatinine: 0.89 mg/dL (ref 0.61–1.24)
GLUCOSE: 104 mg/dL — AB (ref 70–99)
POTASSIUM: 4.1 mmol/L (ref 3.5–5.1)
Sodium: 136 mmol/L (ref 135–145)
TOTAL PROTEIN: 6.9 g/dL (ref 6.5–8.1)

## 2018-08-31 LAB — TSH: TSH: 3.006 u[IU]/mL (ref 0.320–4.118)

## 2018-08-31 NOTE — Progress Notes (Signed)
[place] OFFICE PROGRESS NOTE  System, Pcp Not In No address on file  DIAGNOSIS:Stage IV (T3, N0, M1 a) non-small cell lung cancer, squamous cell carcinoma presented with multiple bilateral pulmonary nodules diagnosed in April 2018.  PRIOR THERAPY: Systemic chemotherapy with carboplatin for AUC of 5 and paclitaxel 175 MG/M2 every 3 weeks with Neulasta support. Status post 6 cycles.  CURRENT THERAPY: Second line treatment with immunotherapy was Nivolumab 480 mg IV every 4 weeks. First cycle November 14, 2017. Status post9cycles.  INTERVAL HISTORY: [name] [name] 67 y.o. male returns for routine follow-up visit by himself.  The patient reports that he has ongoing diarrhea and was diagnosed with C. difficile.  He is currently on Dificid for 10 days.  He reports that he has about 4 to 5 days of treatment left.  He reports that the diarrhea is slowly improving and that he has 3-4 loose stools per day.  Denies fevers and chills.  Denies chest pain, shortness of breath, cough, hemoptysis.  Denies nausea, vomiting, constipation.  Nivolumab was held 1 month ago secondary to diarrhea.  He is currently receiving SBRT to his lung lesion.  Denies recent weight loss or night sweats.  The patient is here for evaluation prior to his next cycle of nivolumab.  MEDICAL HISTORY: Past Medical History:  Diagnosis Date  . Depression 01/29/2017  . Dyspnea   . Encounter for antineoplastic chemotherapy 01/29/2017  . Goals of care, counseling/discussion 01/29/2017  . Hypertension   . Metastatic squamous cell carcinoma to lung, unspecified laterality ([place]) 01/28/2017  . Stroke [facility])     ALLERGIES:  is allergic to no known allergies.  MEDICATIONS:  Current Outpatient Medications  Medication Sig Dispense Refill  . acetaminophen (TYLENOL) 325 MG tablet You can take 2 every 4 hours as needed.  You can buy it over the counter DO NOT TAKE MORE THAN 4000 MG OF TYLENOL PER DAY.  IT CAN HARM YOUR  LIVER.    Marland Kitchen amitriptyline (ELAVIL) 100 MG tablet Take 1 tablet (100 mg total) by mouth at bedtime. 30 tablet 3  . aspirin EC 81 MG tablet Take 81 mg by mouth daily.    Marland Kitchen gabapentin (NEURONTIN) 300 MG capsule TAKE 2 CAPSULES(600 MG) BY MOUTH 3 TIMES DAILY 90 capsule 2  . hydrochlorothiazide (HYDRODIURIL) 25 MG tablet Restart this after you see your Primary care doctor.  Check your blood pressure at home and record.    Marland Kitchen lisinopril (PRINIVIL,ZESTRIL) 40 MG tablet Take 40 mg by mouth daily.    . metoprolol succinate (TOPROL-XL) 50 MG 24 hr tablet 50 mg.  0  . metroNIDAZOLE (FLAGYL) 500 MG tablet Take 1 tablet by mouth every 8 (eight) hours as needed.  0  . mirtazapine (REMERON) 30 MG tablet Take 1 tablet (30 mg total) by mouth at bedtime. 30 tablet 1  . pantoprazole (PROTONIX) 40 MG tablet Take 1 tablet (40 mg total) by mouth 2 (two) times daily. (Patient not taking: Reported on 08/09/2018) 60 tablet 1  . ranitidine (ZANTAC) 150 MG tablet Take 150 mg by mouth 2 (two) times daily.    . simvastatin (ZOCOR) 5 MG tablet Take 1 tablet (5 mg total) by mouth daily. 5 tablet 3  . temazepam (RESTORIL) 30 MG capsule TAKE 1 CAPSULE BY MOUTH AT BEDTIME AS NEEDED FOR SLEEP 30 capsule 0   No current facility-administered medications for this visit.     SURGICAL HISTORY:  Past Surgical History:  Procedure Laterality Date  .  BACK SURGERY     cyst removal  . COLONOSCOPY    . LAPAROTOMY N/A 02/25/2017   Procedure: EXPLORATORY LAPAROTOMY MODIFIED GRAHAM'S PATCH;  Surgeon: Kinsinger, Arta Bruce, MD;  Location: [facility];  Service: General;  Laterality: N/A;  . MINOR PLACEMENT OF FIDUCIAL N/A 01/19/2017   Procedure: MINOR PLACEMENT OF FIDUCIAL x 6;  Surgeon: Collene Gobble, MD;  Location: [place];  Service: Thoracic;  Laterality: N/A;  . VIDEO BRONCHOSCOPY WITH ENDOBRONCHIAL NAVIGATION N/A 01/19/2017   Procedure: VIDEO BRONCHOSCOPY WITH ENDOBRONCHIAL NAVIGATION;  Surgeon: Collene Gobble, MD;  Location: [place];  Service:  Thoracic;  Laterality: N/A;    REVIEW OF SYSTEMS:   Review of Systems  Constitutional: Negative for appetite change, chills, fatigue, fever and unexpected weight change.  HENT:   Negative for mouth sores, nosebleeds, sore throat and trouble swallowing.   Eyes: Negative for eye problems and icterus.  Respiratory: Negative for cough, hemoptysis, shortness of breath and wheezing.   Cardiovascular: Negative for chest pain and leg swelling.  Gastrointestinal: Negative for abdominal pain, constipation, nausea and vomiting.  Positive for diarrhea. Genitourinary: Negative for bladder incontinence, difficulty urinating, dysuria, frequency and hematuria.   Musculoskeletal: Negative for back pain, gait problem, neck pain and neck stiffness.  Skin: Negative for itching and rash.  Neurological: Negative for dizziness, extremity weakness, gait problem, headaches, light-headedness and seizures.  Hematological: Negative for adenopathy. Does not bruise/bleed easily.  Psychiatric/Behavioral: Negative for confusion, depression and sleep disturbance. The patient is not nervous/anxious.     PHYSICAL EXAMINATION:  Blood pressure 128/82, pulse 63, temperature 98 F (36.7 C), temperature source Oral, resp. rate 18, height 6\' 1"  (1.854 m), weight 209 lb 8 oz (95 kg), SpO2 96 %.  ECOG PERFORMANCE STATUS: 1 - Symptomatic but completely ambulatory  Physical Exam  Constitutional: Oriented to person, place, and time and well-developed, well-nourished, and in no distress. No distress.  HENT:  Head: Normocephalic and atraumatic.  Mouth/Throat: Oropharynx is clear and moist. No oropharyngeal exudate.  Eyes: Conjunctivae are normal. Right eye exhibits no discharge. Left eye exhibits no discharge. No scleral icterus.  Neck: Normal range of motion. Neck supple.  Cardiovascular: Normal rate, regular rhythm, normal heart sounds and intact distal pulses.   Pulmonary/Chest: Effort normal and breath sounds normal. No  respiratory distress. No wheezes. No rales.  Abdominal: Soft. Bowel sounds are normal. Exhibits no distension and no mass. There is no tenderness.  Musculoskeletal: Normal range of motion. Exhibits no edema.  Lymphadenopathy:    No cervical adenopathy.  Neurological: Alert and oriented to person, place, and time. Exhibits normal muscle tone. Gait normal. Coordination normal.  Skin: Skin is warm and dry. No rash noted. Not diaphoretic. No erythema. No pallor.  Psychiatric: Mood, memory and judgment normal.  Vitals reviewed.  LABORATORY DATA: Lab Results  Component Value Date   WBC 9.1 08/31/2018   HGB 14.0 08/31/2018   HCT 43.2 08/31/2018   MCV 90.0 08/31/2018   PLT 269 08/31/2018      Chemistry      Component Value Date/Time   NA 136 08/31/2018 1129   NA 139 10/06/2017 1052   K 4.1 08/31/2018 1129   K 4.2 10/06/2017 1052   CL 104 08/31/2018 1129   CO2 25 08/31/2018 1129   CO2 25 10/06/2017 1052   BUN 10 08/31/2018 1129   BUN 13.2 10/06/2017 1052   CREATININE 0.89 08/31/2018 1129   CREATININE 0.9 10/06/2017 1052      Component Value Date/Time  CALCIUM 8.7 (L) 08/31/2018 1129   CALCIUM 9.5 10/06/2017 1052   ALKPHOS 64 08/31/2018 1129   ALKPHOS 68 10/06/2017 1052   AST 13 (L) 08/31/2018 1129   AST 14 10/06/2017 1052   ALT 11 08/31/2018 1129   ALT 13 10/06/2017 1052   BILITOT 0.4 08/31/2018 1129   BILITOT 0.44 10/06/2017 1052       RADIOGRAPHIC STUDIES:  Ct Chest W Contrast  Result Date: 08/02/2018 CLINICAL DATA:  Lung cancer EXAM: CT CHEST, ABDOMEN, AND PELVIS WITH CONTRAST TECHNIQUE: Multidetector CT imaging of the chest, abdomen and pelvis was performed following the standard protocol during bolus administration of intravenous contrast. CONTRAST:  146mL OMNIPAQUE IOHEXOL 300 MG/ML  SOLN COMPARISON:  05/09/2018 FINDINGS: CT CHEST FINDINGS Cardiovascular: The heart size is normal. No substantial pericardial effusion. Coronary artery calcification is evident.  Atherosclerotic calcification is noted in the wall of the thoracic aorta. Mediastinum/Nodes: 9 mm short axis precarinal lymph node measured on the prior study is stable. No mediastinal lymphadenopathy. There is no hilar lymphadenopathy. The esophagus has normal imaging features. There is no axillary lymphadenopathy. Lungs/Pleura: The central tracheobronchial airways are patent. Centrilobular and paraseptal emphysema evident. Numerous bilateral pulmonary nodules are again noted, many of which measure less than 5 mm. 1 of the more dominant nodules is noted in the posterior right apex, measuring 10 mm on 45/2, stable in the interval. Right upper lobe fiducial markers again noted. Dominant lesion is again noted in the posterior left upper lobe, measuring 14 x 15 mm today compared to 11 x 12 mm previously. Fiducial markers are seen in the region of this lesion, as before. No pulmonary edema or pleural effusion. Musculoskeletal: No worrisome lytic or sclerotic osseous abnormality. CT ABDOMEN PELVIS FINDINGS Hepatobiliary: No focal abnormality within the liver parenchyma. There is no evidence for gallstones, gallbladder wall thickening, or pericholecystic fluid. No intrahepatic or extrahepatic biliary dilation. Pancreas: No focal mass lesion. No dilatation of the main duct. No intraparenchymal cyst. No peripancreatic edema. Spleen: No splenomegaly. No focal mass lesion. Adrenals/Urinary Tract: Stable appearance of left adrenal thickening, measuring 13 mm today at the same level it was measured previously at 14 mm. No suspicious abnormality in either kidney. Cortical scarring in the upper pole right kidney is stable. No evidence for hydroureter. The urinary bladder appears normal for the degree of distention. Stomach/Bowel: Stomach is nondistended. No gastric wall thickening. No evidence of outlet obstruction. Duodenum is normally positioned as is the ligament of Treitz. No small bowel wall thickening. No small bowel  dilatation. The terminal ileum is normal. The appendix is normal. Mild, irregular circumferential wall thickening is noted diffusely in the colon. Vascular/Lymphatic: There is abdominal aortic atherosclerosis with infrarenal abdominal aortic aneurysm measuring 3.1 cm diameter. There is no gastrohepatic or hepatoduodenal ligament lymphadenopathy. No intraperitoneal or retroperitoneal lymphadenopathy. No pelvic sidewall lymphadenopathy. Reproductive: The prostate gland and seminal vesicles have normal imaging features. Other: No intraperitoneal free fluid. Musculoskeletal: Similar appearance of umbilical and paraumbilical hernias containing only fat. Right groin hernia contains only fat. No worrisome lytic or sclerotic osseous abnormality. IMPRESSION: 1. Interval progression of the spiculated left upper lobe pulmonary nodule concerning for neoplasm and now measuring up to 15 mm. 2. Dominant right lung nodule, seen in the upper lobe is stable in the interval. Other scattered tiny bilateral pulmonary nodules are without substantial change. 3. Diffuse circumferential colonic wall thickening suggests infectious/inflammatory colitis. 4. Infrarenal abdominal aortic aneurysm measures 3.1 cm. Continued attention on follow-up imaging recommended. Electronically Signed  By: Misty Stanley M.D.   On: 08/02/2018 15:14   Ct Abdomen Pelvis W Contrast  Result Date: 08/02/2018 CLINICAL DATA:  Lung cancer EXAM: CT CHEST, ABDOMEN, AND PELVIS WITH CONTRAST TECHNIQUE: Multidetector CT imaging of the chest, abdomen and pelvis was performed following the standard protocol during bolus administration of intravenous contrast. CONTRAST:  134mL OMNIPAQUE IOHEXOL 300 MG/ML  SOLN COMPARISON:  05/09/2018 FINDINGS: CT CHEST FINDINGS Cardiovascular: The heart size is normal. No substantial pericardial effusion. Coronary artery calcification is evident. Atherosclerotic calcification is noted in the wall of the thoracic aorta. Mediastinum/Nodes:  9 mm short axis precarinal lymph node measured on the prior study is stable. No mediastinal lymphadenopathy. There is no hilar lymphadenopathy. The esophagus has normal imaging features. There is no axillary lymphadenopathy. Lungs/Pleura: The central tracheobronchial airways are patent. Centrilobular and paraseptal emphysema evident. Numerous bilateral pulmonary nodules are again noted, many of which measure less than 5 mm. 1 of the more dominant nodules is noted in the posterior right apex, measuring 10 mm on 45/2, stable in the interval. Right upper lobe fiducial markers again noted. Dominant lesion is again noted in the posterior left upper lobe, measuring 14 x 15 mm today compared to 11 x 12 mm previously. Fiducial markers are seen in the region of this lesion, as before. No pulmonary edema or pleural effusion. Musculoskeletal: No worrisome lytic or sclerotic osseous abnormality. CT ABDOMEN PELVIS FINDINGS Hepatobiliary: No focal abnormality within the liver parenchyma. There is no evidence for gallstones, gallbladder wall thickening, or pericholecystic fluid. No intrahepatic or extrahepatic biliary dilation. Pancreas: No focal mass lesion. No dilatation of the main duct. No intraparenchymal cyst. No peripancreatic edema. Spleen: No splenomegaly. No focal mass lesion. Adrenals/Urinary Tract: Stable appearance of left adrenal thickening, measuring 13 mm today at the same level it was measured previously at 14 mm. No suspicious abnormality in either kidney. Cortical scarring in the upper pole right kidney is stable. No evidence for hydroureter. The urinary bladder appears normal for the degree of distention. Stomach/Bowel: Stomach is nondistended. No gastric wall thickening. No evidence of outlet obstruction. Duodenum is normally positioned as is the ligament of Treitz. No small bowel wall thickening. No small bowel dilatation. The terminal ileum is normal. The appendix is normal. Mild, irregular circumferential  wall thickening is noted diffusely in the colon. Vascular/Lymphatic: There is abdominal aortic atherosclerosis with infrarenal abdominal aortic aneurysm measuring 3.1 cm diameter. There is no gastrohepatic or hepatoduodenal ligament lymphadenopathy. No intraperitoneal or retroperitoneal lymphadenopathy. No pelvic sidewall lymphadenopathy. Reproductive: The prostate gland and seminal vesicles have normal imaging features. Other: No intraperitoneal free fluid. Musculoskeletal: Similar appearance of umbilical and paraumbilical hernias containing only fat. Right groin hernia contains only fat. No worrisome lytic or sclerotic osseous abnormality. IMPRESSION: 1. Interval progression of the spiculated left upper lobe pulmonary nodule concerning for neoplasm and now measuring up to 15 mm. 2. Dominant right lung nodule, seen in the upper lobe is stable in the interval. Other scattered tiny bilateral pulmonary nodules are without substantial change. 3. Diffuse circumferential colonic wall thickening suggests infectious/inflammatory colitis. 4. Infrarenal abdominal aortic aneurysm measures 3.1 cm. Continued attention on follow-up imaging recommended. Electronically Signed   By: Misty Stanley M.D.   On: 08/02/2018 15:14     ASSESSMENT/PLAN:  Metastatic squamous cell carcinoma to lung, unspecified laterality [facility]) This is a very pleasant 67 year old white male with stage IV non-small cell lung cancer, squamous cell carcinoma presented with multiple bilateral pulmonary nodules. The patient was treated  with systemic chemotherapy with carboplatin and paclitaxel status post 6 cycles. He tolerated the last cycle of his treatment well except for the persistent peripheral neuropathy. The patient has been in observation for the last 3 months. He had a repeat CT scan of the chest, abdomen and pelvis performed recently. Has a scan showed mild interval enlargement of left upper lobe as well as right upper lobe pulmonary nodules  concerning for disease progression. The patient was started on treatment with immunotherapy with Nivolumab 480 mg IV every 4 weeks status post9cycles.  He has been tolerating this treatment fairly well but has now developed increased diarrhea.    His treatment was held 1 month ago and he reports that he was diagnosed with C. difficile and is currently on Dificid.  He has 4 to 5 days left of his treatment.  He still has ongoing loose stools but improving.  Recommend for him to hold treatment today due to ongoing diarrhea.  We will delay the start of cycle #10 by 7 to 10 days.  The patient will follow-up in approximately 5 weeks for evaluation prior to cycle #11.    For the peripheral neuropathy hewas advised to resume treatment with Elavil.If he has difficulty tolerating this medication, I have advised him to contact Dr. Mickeal Skinner.  He was advised to call immediately if he has any concerning symptoms in the interval. The patient voices understanding of current disease status and treatment options and is in agreement with the current care plan. All questions were answered. The patient knows to call the clinic with any problems, questions or concerns. We can certainly see the patient much sooner if necessary.   No orders of the defined types were placed in this encounter.    Mikey Bussing, DNP, AGPCNP-BC, AOCNP 09/01/18

## 2018-08-31 NOTE — Progress Notes (Signed)
  Radiation Oncology         [phone] ________________________________  Name: JAYRO MCMATH MRN: [id number]  Date: 08/31/2018  DOB: 11-30-1950  Stereotactic Body Radiotherapy Treatment Procedure Note  NARRATIVE:  ASHAN CUEVA was brought to the stereotactic radiation treatment machine and placed supine on the CT couch. The patient was set up for stereotactic body radiotherapy on the body fix pillow.  3D TREATMENT PLANNING AND DOSIMETRY:  The patient's radiation plan was reviewed and approved prior to starting treatment.  It showed 3-dimensional radiation distributions overlaid onto the planning CT.  The [facility] for the target structures as well as the organs at risk were reviewed. The documentation of this is filed in the radiation oncology EMR.  SIMULATION VERIFICATION:  The patient underwent CT imaging on the treatment unit.  These were carefully aligned to document that the ablative radiation dose would cover the target volume and maximally spare the nearby organs at risk according to the planned distribution.  SPECIAL TREATMENT PROCEDURE: Rochele Raring received high dose ablative stereotactic body radiotherapy to the planned target volume without unforeseen complications. Treatment was delivered uneventfully. The high doses associated with stereotactic body radiotherapy and the significant potential risks require careful treatment set up and patient monitoring constituting a special treatment procedure   STEREOTACTIC TREATMENT MANAGEMENT:  Following delivery, the patient was evaluated clinically. The patient tolerated treatment without significant acute effects, and was discharged to home in stable condition.    PLAN: Continue treatment as planned.  ________________________________  Blair Promise, PhD, MD  This document serves as a record of services personally performed by [name], MD. It was created on his behalf by [facility], a trained medical scribe. The creation of this  record is based on the scribe's personal observations and the provider's statements to them. This document has been checked and approved by the attending provider.

## 2018-09-01 ENCOUNTER — Other Ambulatory Visit: Payer: Self-pay | Admitting: Oncology

## 2018-09-01 DIAGNOSIS — C7931 Secondary malignant neoplasm of brain: Secondary | ICD-10-CM

## 2018-09-01 DIAGNOSIS — Z5111 Encounter for antineoplastic chemotherapy: Secondary | ICD-10-CM

## 2018-09-01 DIAGNOSIS — C7800 Secondary malignant neoplasm of unspecified lung: Secondary | ICD-10-CM

## 2018-09-01 MED ORDER — GABAPENTIN 300 MG PO CAPS: ORAL_CAPSULE | ORAL | 1 refills | Status: DC

## 2018-09-01 NOTE — Assessment & Plan Note (Signed)
This is a very pleasant 67 year old white male with stage IV non-small cell lung cancer, squamous cell carcinoma presented with multiple bilateral pulmonary nodules. The patient was treated with systemic chemotherapy with carboplatin and paclitaxel status post 6 cycles. He tolerated the last cycle of his treatment well except for the persistent peripheral neuropathy. The patient has been in observation for the last 3 months. He had a repeat CT scan of the chest, abdomen and pelvis performed recently. Has a scan showed mild interval enlargement of left upper lobe as well as right upper lobe pulmonary nodules concerning for disease progression. The patient was started on treatment with immunotherapy with Nivolumab 480 mg IV every 4 weeks status post9cycles.  He has been tolerating this treatment fairly well but has now developed increased diarrhea.    His treatment was held 1 month ago and he reports that he was diagnosed with C. difficile and is currently on Dificid.  He has 4 to 5 days left of his treatment.  He still has ongoing loose stools but improving.  Recommend for him to hold treatment today due to ongoing diarrhea.  We will delay the start of cycle #10 by 7 to 10 days.  The patient will follow-up in approximately 5 weeks for evaluation prior to cycle #11.    For the peripheral neuropathy hewas advised to resume treatment with Elavil.If he has difficulty tolerating this medication, I have advised him to contact Dr. Mickeal Skinner.  He was advised to call immediately if he has any concerning symptoms in the interval. The patient voices understanding of current disease status and treatment options and is in agreement with the current care plan. All questions were answered. The patient knows to call the clinic with any problems, questions or concerns. We can certainly see the patient much sooner if necessary.

## 2018-09-04 ENCOUNTER — Other Ambulatory Visit: Payer: Self-pay | Admitting: *Deleted

## 2018-09-04 ENCOUNTER — Other Ambulatory Visit: Payer: Self-pay | Admitting: Internal Medicine

## 2018-09-04 DIAGNOSIS — C7800 Secondary malignant neoplasm of unspecified lung: Secondary | ICD-10-CM

## 2018-09-04 DIAGNOSIS — Z5111 Encounter for antineoplastic chemotherapy: Secondary | ICD-10-CM

## 2018-09-04 DIAGNOSIS — C7931 Secondary malignant neoplasm of brain: Secondary | ICD-10-CM

## 2018-09-04 MED ORDER — GABAPENTIN 300 MG PO CAPS: ORAL_CAPSULE | ORAL | 1 refills | Status: DC

## 2018-09-05 ENCOUNTER — Ambulatory Visit
Admission: RE | Admit: 2018-09-05 | Discharge: 2018-09-05 | Disposition: A | Discharge status: Home / Self Care | Payer: Medicare Other | Source: Ambulatory Visit | Attending: Radiation Oncology | Admitting: Radiation Oncology

## 2018-09-05 DIAGNOSIS — C7802 Secondary malignant neoplasm of left lung: Secondary | ICD-10-CM | POA: Diagnosis not present

## 2018-09-05 DIAGNOSIS — C7801 Secondary malignant neoplasm of right lung: Secondary | ICD-10-CM | POA: Diagnosis not present

## 2018-09-05 DIAGNOSIS — J449 Chronic obstructive pulmonary disease, unspecified: Secondary | ICD-10-CM | POA: Diagnosis not present

## 2018-09-05 DIAGNOSIS — C3492 Malignant neoplasm of unspecified part of left bronchus or lung: Secondary | ICD-10-CM | POA: Diagnosis not present

## 2018-09-05 DIAGNOSIS — F17210 Nicotine dependence, cigarettes, uncomplicated: Secondary | ICD-10-CM | POA: Diagnosis not present

## 2018-09-05 DIAGNOSIS — R197 Diarrhea, unspecified: Secondary | ICD-10-CM | POA: Diagnosis not present

## 2018-09-05 DIAGNOSIS — I10 Essential (primary) hypertension: Secondary | ICD-10-CM | POA: Diagnosis not present

## 2018-09-05 DIAGNOSIS — C7800 Secondary malignant neoplasm of unspecified lung: Secondary | ICD-10-CM

## 2018-09-05 NOTE — Progress Notes (Signed)
  Radiation Oncology         [phone] ________________________________  Name: AZAM GERVASI MRN: [id number]  Date: 09/05/2018  DOB: 06/01/1951  Stereotactic Body Radiotherapy Treatment Procedure Note  NARRATIVE:  CAILLOU MINUS was brought to the stereotactic radiation treatment machine and placed supine on the CT couch. The patient was set up for stereotactic body radiotherapy on the body fix pillow.  3D TREATMENT PLANNING AND DOSIMETRY:  The patient's radiation plan was reviewed and approved prior to starting treatment.  It showed 3-dimensional radiation distributions overlaid onto the planning CT.  [facility] for the target structures as well as the organs at risk were reviewed. The documentation of this is filed in the radiation oncology EMR.  SIMULATION VERIFICATION:  The patient underwent CT imaging on the treatment unit.  These were carefully aligned to document that the ablative radiation dose would cover the target volume and maximally spare the nearby organs at risk according to the planned distribution.  SPECIAL TREATMENT PROCEDURE: Rochele Raring received high dose ablative stereotactic body radiotherapy to the planned target volume without unforeseen complications. Treatment was delivered uneventfully. The high doses associated with stereotactic body radiotherapy and the significant potential risks require careful treatment set up and patient monitoring constituting a special treatment procedure   STEREOTACTIC TREATMENT MANAGEMENT:  Following delivery, the patient was evaluated clinically. The patient tolerated treatment without significant acute effects, and was discharged to home in stable condition.    PLAN: Continue treatment as planned.  ________________________________  Blair Promise, PhD, MD

## 2018-09-06 ENCOUNTER — Encounter: Payer: Self-pay | Admitting: Radiation Oncology

## 2018-09-06 NOTE — Progress Notes (Signed)
  Radiation Oncology         [phone] ________________________________  Name: [name] [name] MRN: [id number]  Date: 09/06/2018  DOB: Oct 26, 1950  End of Treatment Note  Diagnosis:   Stage IV (T3, N0, M1a) non-small cell lung cancer, squamous cell carcinoma presented with multiple bilateral pulmonary nodules diagnosed in April 2018, now with oligometastasis in the left lung     Indication for treatment:  Curative       Radiation treatment dates:   08/29/18-09/05/18  Site/dose:   SBRT Lung; 18 Gy in 3 fractions for a total of 54 Gy  Beams/energy:   SBRT, 6X-FFF  Narrative: The patient tolerated radiation treatment relatively well.    He also denied chest pain, shortness of breath, cough, hemoptysis, nausea, vomiting, and constipation. He denied radiation-related pain throughout treatment.Towards the end of treatment, pt reported SOB on exertion. He also reported a productive cough with white sputum. He denied hemoptysis and trouble swallowing. Overall the pt was without complaints.   Plan: The patient has completed radiation treatment. The patient will return to radiation oncology clinic for routine followup in one month. I advised them to call or return sooner if they have any questions or concerns related to their recovery or treatment.  -----------------------------------  Blair Promise, PhD, MD  This document serves as a record of services personally performed by [name], MD. It was created on his behalf by Mary-Margaret Loma Messing, a trained medical scribe. The creation of this record is based on the scribe's personal observations and the provider's statements to them. This document has been checked and approved by the attending provider.

## 2018-09-07 ENCOUNTER — Ambulatory Visit: Payer: Medicare Other | Admitting: Family

## 2018-09-07 ENCOUNTER — Encounter (HOSPITAL_COMMUNITY): Payer: Medicare Other

## 2018-09-07 ENCOUNTER — Ambulatory Visit: Payer: Medicare Other | Admitting: Internal Medicine

## 2018-09-08 ENCOUNTER — Encounter: Payer: Self-pay | Admitting: Family

## 2018-09-08 ENCOUNTER — Other Ambulatory Visit: Payer: Self-pay

## 2018-09-08 ENCOUNTER — Ambulatory Visit: Payer: Medicare Other | Admitting: Family

## 2018-09-08 ENCOUNTER — Ambulatory Visit (INDEPENDENT_AMBULATORY_CARE_PROVIDER_SITE_OTHER): Payer: Medicare Other | Admitting: Family

## 2018-09-08 ENCOUNTER — Inpatient Hospital Stay: Payer: Medicare Other

## 2018-09-08 ENCOUNTER — Ambulatory Visit (HOSPITAL_COMMUNITY)
Admission: RE | Admit: 2018-09-08 | Discharge: 2018-09-08 | Disposition: A | Discharge status: Home / Self Care | Payer: Medicare Other | Source: Ambulatory Visit | Attending: Family | Admitting: Family

## 2018-09-08 VITALS — BP 113/85 | HR 77 | Temp 98.0°F | Resp 16

## 2018-09-08 VITALS — BP 116/78 | HR 73 | Temp 97.0°F | Resp 16 | Ht 73.0 in | Wt 207.0 lb

## 2018-09-08 DIAGNOSIS — I6521 Occlusion and stenosis of right carotid artery: Secondary | ICD-10-CM | POA: Diagnosis not present

## 2018-09-08 DIAGNOSIS — I6523 Occlusion and stenosis of bilateral carotid arteries: Secondary | ICD-10-CM

## 2018-09-08 DIAGNOSIS — Z5112 Encounter for antineoplastic immunotherapy: Secondary | ICD-10-CM | POA: Diagnosis not present

## 2018-09-08 DIAGNOSIS — F17200 Nicotine dependence, unspecified, uncomplicated: Secondary | ICD-10-CM

## 2018-09-08 DIAGNOSIS — C3411 Malignant neoplasm of upper lobe, right bronchus or lung: Secondary | ICD-10-CM | POA: Diagnosis not present

## 2018-09-08 DIAGNOSIS — I6522 Occlusion and stenosis of left carotid artery: Secondary | ICD-10-CM

## 2018-09-08 DIAGNOSIS — C7800 Secondary malignant neoplasm of unspecified lung: Secondary | ICD-10-CM

## 2018-09-08 DIAGNOSIS — C3412 Malignant neoplasm of upper lobe, left bronchus or lung: Secondary | ICD-10-CM | POA: Diagnosis not present

## 2018-09-08 DIAGNOSIS — Z79899 Other long term (current) drug therapy: Secondary | ICD-10-CM | POA: Diagnosis not present

## 2018-09-08 DIAGNOSIS — G620 Drug-induced polyneuropathy: Secondary | ICD-10-CM | POA: Diagnosis not present

## 2018-09-08 LAB — CMP (CANCER CENTER ONLY)
ALBUMIN: 3.6 g/dL (ref 3.5–5.0)
ALK PHOS: 72 U/L (ref 38–126)
ALT: 10 U/L (ref 0–44)
ANION GAP: 10 (ref 5–15)
AST: 11 U/L — ABNORMAL LOW (ref 15–41)
BUN: 12 mg/dL (ref 8–23)
CO2: 25 mmol/L (ref 22–32)
Calcium: 9.3 mg/dL (ref 8.9–10.3)
Chloride: 103 mmol/L (ref 98–111)
Creatinine: 1.03 mg/dL (ref 0.61–1.24)
GFR, Estimated: >60 mL/min (ref >60)
Glucose, Bld: 110 mg/dL — ABNORMAL HIGH (ref 70–99)
POTASSIUM: 4.6 mmol/L (ref 3.5–5.1)
SODIUM: 138 mmol/L (ref 135–145)
TOTAL PROTEIN: 7.5 g/dL (ref 6.5–8.1)
Total Bilirubin: 0.5 mg/dL (ref 0.3–1.2)

## 2018-09-08 LAB — CBC WITH DIFFERENTIAL (CANCER CENTER ONLY)
ABS IMMATURE GRANULOCYTES: 0.03 10*3/uL (ref 0.00–0.07)
BASOS ABS: 0.1 10*3/uL (ref 0.0–0.1)
BASOS PCT: 1 %
Eosinophils Absolute: 0.1 10*3/uL (ref 0.0–0.5)
Eosinophils Relative: 2 %
HCT: 45.7 % (ref 39.0–52.0)
Hemoglobin: 14.5 g/dL (ref 13.0–17.0)
IMMATURE GRANULOCYTES: 0 %
Lymphocytes Relative: 37 %
Lymphs Abs: 3.2 10*3/uL (ref 0.7–4.0)
MCH: 28.8 pg (ref 26.0–34.0)
MCHC: 31.7 g/dL (ref 30.0–36.0)
MCV: 90.7 fL (ref 80.0–100.0)
Monocytes Absolute: 0.8 10*3/uL (ref 0.1–1.0)
Monocytes Relative: 9 %
NEUTROS ABS: 4.6 10*3/uL (ref 1.7–7.7)
NEUTROS PCT: 51 %
NRBC: 0 % (ref 0.0–0.2)
PLATELETS: 247 10*3/uL (ref 150–400)
RBC: 5.04 MIL/uL (ref 4.22–5.81)
RDW: 14 % (ref 11.5–15.5)
WBC Count: 8.8 10*3/uL (ref 4.0–10.5)

## 2018-09-08 LAB — TSH: TSH: 3.39 u[IU]/mL (ref 0.320–4.118)

## 2018-09-08 MED ORDER — HEPARIN SOD (PORK) LOCK FLUSH 100 UNIT/ML IV SOLN
500.0000 [IU] | Freq: Once | INTRAVENOUS | Status: DC | PRN
  Filled 2018-09-08: qty 5

## 2018-09-08 MED ORDER — SODIUM CHLORIDE 0.9% FLUSH
10.0000 mL | INTRAVENOUS | Status: DC | PRN
  Filled 2018-09-08: qty 10

## 2018-09-08 MED ORDER — SODIUM CHLORIDE 0.9 % IV SOLN
480.0000 mg | Freq: Once | INTRAVENOUS | Status: AC
  Administered 2018-09-08: 480 mg via INTRAVENOUS
  Filled 2018-09-08: qty 48

## 2018-09-08 MED ORDER — SODIUM CHLORIDE 0.9 % IV SOLN
Freq: Once | INTRAVENOUS | Status: AC
  Administered 2018-09-08: 14:00:00 via INTRAVENOUS
  Filled 2018-09-08: qty 250

## 2018-09-08 NOTE — Patient Instructions (Addendum)
Steps to Quit Smoking Smoking tobacco can be bad for your health. It can also affect almost every organ in your body. Smoking puts you and people around you at risk for many serious long-lasting (chronic) diseases. Quitting smoking is hard, but it is one of the best things that you can do for your health. It is never too late to quit. What are the benefits of quitting smoking? When you quit smoking, you lower your risk for getting serious diseases and conditions. They can include:  Lung cancer or lung disease.  Heart disease.  Stroke.  Heart attack.  Not being able to have children (infertility).  Weak bones (osteoporosis) and broken bones (fractures).  If you have coughing, wheezing, and shortness of breath, those symptoms may get better when you quit. You may also get sick less often. If you are pregnant, quitting smoking can help to lower your chances of having a baby of low birth weight. What can I do to help me quit smoking? Talk with your doctor about what can help you quit smoking. Some things you can do (strategies) include:  Quitting smoking totally, instead of slowly cutting back how much you smoke over a period of time.  Going to in-person counseling. You are more likely to quit if you go to many counseling sessions.  Using resources and support systems, such as: ? Online chats with a counselor. ? Phone quitlines. ? Printed self-help materials. ? Support groups or group counseling. ? Text messaging programs. ? Mobile phone apps or applications.  Taking medicines. Some of these medicines may have nicotine in them. If you are pregnant or breastfeeding, do not take any medicines to quit smoking unless your doctor says it is okay. Talk with your doctor about counseling or other things that can help you.  Talk with your doctor about using more than one strategy at the same time, such as taking medicines while you are also going to in-person counseling. This can help make  quitting easier. What things can I do to make it easier to quit? Quitting smoking might feel very hard at first, but there is a lot that you can do to make it easier. Take these steps:  Talk to your family and friends. Ask them to support and encourage you.  Call phone quitlines, reach out to support groups, or work with a counselor.  Ask people who smoke to not smoke around you.  Avoid places that make you want (trigger) to smoke, such as: ? Bars. ? Parties. ? Smoke-break areas at work.  Spend time with people who do not smoke.  Lower the stress in your life. Stress can make you want to smoke. Try these things to help your stress: ? Getting regular exercise. ? Deep-breathing exercises. ? Yoga. ? Meditating. ? Doing a body scan. To do this, close your eyes, focus on one area of your body at a time from head to toe, and notice which parts of your body are tense. Try to relax the muscles in those areas.  Download or buy apps on your mobile phone or tablet that can help you stick to your quit plan. There are many free apps, such as QuitGuide from the CDC (Centers for Disease Control and Prevention). You can find more support from smokefree.gov and other websites.  This information is not intended to replace advice given to you by your health care provider. Make sure you discuss any questions you have with your health care provider. Document Released: 07/31/2009 Document   Revised: 06/01/2016 Document Reviewed: 02/18/2015 Elsevier Interactive Patient Education  2018 Elsevier Inc.     Stroke Prevention Some health problems and behaviors may make it more likely for you to have a stroke. Below are ways to lessen your risk of having a stroke.  Be active for at least 30 minutes on most or all days.  Do not smoke. Try not to be around others who smoke.  Do not drink too much alcohol. ? Do not have more than 2 drinks a day if you are a man. ? Do not have more than 1 drink a day if you  are a woman and are not pregnant.  Eat healthy foods, such as fruits and vegetables. If you were put on a specific diet, follow the diet as told.  Keep your cholesterol levels under control through diet and medicines. Look for foods that are low in saturated fat, trans fat, cholesterol, and are high in fiber.  If you have diabetes, follow all diet plans and take your medicine as told.  Ask your doctor if you need treatment to lower your blood pressure. If you have high blood pressure (hypertension), follow all diet plans and take your medicine as told by your doctor.  If you are 18-39 years old, have your blood pressure checked every 3-5 years. If you are age 40 or older, have your blood pressure checked every year.  Keep a healthy weight. Eat foods that are low in calories, salt, saturated fat, trans fat, and cholesterol.  Do not take drugs.  Avoid birth control pills, if this applies. Talk to your doctor about the risks of taking birth control pills.  Talk to your doctor if you have sleep problems (sleep apnea).  Take all medicine as told by your doctor. ? You may be told to take aspirin or blood thinner medicine. Take this medicine as told by your doctor. ? Understand your medicine instructions.  Make sure any other conditions you have are being taken care of.  Get help right away if:  You suddenly lose feeling (you feel numb) or have weakness in your face, arm, or leg.  Your face or eyelid hangs down to one side.  You suddenly feel confused.  You have trouble talking (aphasia) or understanding what people are saying.  You suddenly have trouble seeing in one or both eyes.  You suddenly have trouble walking.  You are dizzy.  You lose your balance or your movements are clumsy (uncoordinated).  You suddenly have a very bad headache and you do not know the cause.  You have new chest pain.  Your heart feels like it is fluttering or skipping a beat (irregular  heartbeat). Do not wait to see if the symptoms above go away. Get help right away. Call your local emergency services (911 in [place] Do not drive yourself to the hospital. This information is not intended to replace advice given to you by your health care provider. Make sure you discuss any questions you have with your health care provider. Document Released: 04/04/2012 Document Revised: 03/11/2016 Document Reviewed: 04/06/2013 Elsevier Interactive Patient Education  2018 Elsevier Inc.  

## 2018-09-08 NOTE — Progress Notes (Signed)
Chief Complaint: Follow up Extracranial Carotid Artery Stenosis   History of Present Illness  [name] [name] is a 67 y.o. male who was referred to Dr. Trula Slade for evaluation of carotid occlusive disease. Approximately 2 months before his initial visit on 07-18-17, the patient developed vision changes in his right eye. This lasted for approximately a few hours. He was found to have an occluded right carotid artery. The left side was in the 1-50 percent range. The patient told Dr. Trula Slade that he had a ultrasound earlier in the year that showed only mild stenosis bilaterally. The patient did not have any other symptoms.  At pt's 07-18-17 visit,  pt had an occluded right carotid artery in the setting of approximately 50% left carotid stenosis which was asymptomatic: Dr. Trula Slade explained to the patient that his asymptomatic left carotid stenosis will be treated with maximal medical treatment up until it becomes greater than 80%, at which time we would consider endarterectomy or stenting. Dr. Trula Slade started a statin to his medical regimen. He had recently changed primary care physicians that he does not have the name of his position. Dr. Trula Slade advised follow-up in 6 months with a repeat carotid duplex.  Patient is currently being treated for brain metastasis secondary to squamous cell lung cancer, stage 4 per pt. Pt states he has had 3 rounds of radiation tx.  Pt states he has neuropathy in his feet since he has been on chemo.   Diabetic: no Tobacco use: current smoker, 3/4 ppd, started in his teens  Pt meds include: Statin : yes ASA: yes Other anticoagulants/antiplatelets: no   Past Medical History:  Diagnosis Date  . Depression 01/29/2017  . Dyspnea   . Encounter for antineoplastic chemotherapy 01/29/2017  . Goals of care, counseling/discussion 01/29/2017  . Hypertension   . Metastatic squamous cell carcinoma to lung, unspecified laterality ([place]) 01/28/2017  . Stroke [facility])      Social History Social History   Tobacco Use  . Smoking status: Current Every Day Smoker    Packs/day: 1.00    Years: 45.00    Pack years: 45.00    Types: Cigarettes  . Smokeless tobacco: Never Used  . Tobacco comment: Less than 1 pk per day  Substance Use Topics  . Alcohol use: No  . Drug use: Yes    Types: Marijuana    Comment: daily    Family History Family History  Problem Relation Age of Onset  . Cancer Father        unsure of what type    Surgical History Past Surgical History:  Procedure Laterality Date  . BACK SURGERY     cyst removal  . COLONOSCOPY    . LAPAROTOMY N/A 02/25/2017   Procedure: EXPLORATORY LAPAROTOMY MODIFIED GRAHAM'S PATCH;  Surgeon: Kinsinger, Arta Bruce, MD;  Location: [facility];  Service: General;  Laterality: N/A;  . MINOR PLACEMENT OF FIDUCIAL N/A 01/19/2017   Procedure: MINOR PLACEMENT OF FIDUCIAL x 6;  Surgeon: Collene Gobble, MD;  Location: [place];  Service: Thoracic;  Laterality: N/A;  . VIDEO BRONCHOSCOPY WITH ENDOBRONCHIAL NAVIGATION N/A 01/19/2017   Procedure: VIDEO BRONCHOSCOPY WITH ENDOBRONCHIAL NAVIGATION;  Surgeon: Collene Gobble, MD;  Location: [place];  Service: Thoracic;  Laterality: N/A;    Allergies  Allergen Reactions  . No Known Allergies     Current Outpatient Medications  Medication Sig Dispense Refill  . acetaminophen (TYLENOL) 325 MG tablet You can take 2 every 4 hours as needed.  You  can buy it over the counter DO NOT TAKE MORE THAN 4000 MG OF TYLENOL PER DAY.  IT CAN HARM YOUR LIVER.    Marland Kitchen amitriptyline (ELAVIL) 100 MG tablet Take 1 tablet (100 mg total) by mouth at bedtime. 30 tablet 3  . aspirin EC 81 MG tablet Take 81 mg by mouth daily.    Marland Kitchen gabapentin (NEURONTIN) 300 MG capsule TAKE 2 CAPSULES BY MOUTH 3 TIMES DAILY 90 capsule 2  . hydrochlorothiazide (HYDRODIURIL) 25 MG tablet Restart this after you see your Primary care doctor.  Check your blood pressure at home and record.    Marland Kitchen lisinopril (PRINIVIL,ZESTRIL) 40 MG  tablet Take 40 mg by mouth daily.    . metoprolol succinate (TOPROL-XL) 50 MG 24 hr tablet 50 mg.  0  . metroNIDAZOLE (FLAGYL) 500 MG tablet Take 1 tablet by mouth every 8 (eight) hours as needed.  0  . mirtazapine (REMERON) 30 MG tablet Take 1 tablet (30 mg total) by mouth at bedtime. 30 tablet 1  . pantoprazole (PROTONIX) 40 MG tablet Take 1 tablet (40 mg total) by mouth 2 (two) times daily. 60 tablet 1  . ranitidine (ZANTAC) 150 MG tablet Take 150 mg by mouth 2 (two) times daily.    . simvastatin (ZOCOR) 5 MG tablet Take 1 tablet (5 mg total) by mouth daily. 5 tablet 3  . temazepam (RESTORIL) 30 MG capsule TAKE 1 CAPSULE BY MOUTH AT BEDTIME AS NEEDED FOR SLEEP 30 capsule 0   No current facility-administered medications for this visit.     Review of Systems : See HPI for pertinent positives and negatives.  Physical Examination  Vitals:   09/08/18 1012 09/08/18 1026  BP: 127/86 116/78  Pulse: 73   Resp: 16   Temp: (!) 97 F (36.1 C)   TempSrc: Oral   SpO2: 94%   Weight: 207 lb (93.9 kg)   Height: 6\' 1"  (1.854 m)    Body mass index is 27.31 kg/m.    General:WDWN male in NAD GAIT: normal Eyes: PERRLA HENT: No gross abnormalities.  Pulmonary: Respirations are non-labored, limited  air movement in bilateral posterior fields, no rales, rhonchi, or wheezing. Cardiac: regular rhythm, no detected murmur.  VASCULAR EXAM Carotid Bruits Right Left   Negative Negative     Abdominal aortic pulse is not palpable. Radial pulses are 2+ palpable and equal.                                                                                                                                          LE Pulses Right Left       POPLITEAL  not palpable   not palpable       POSTERIOR TIBIAL  2+ palpable   2+ palpable        DORSALIS PEDIS      ANTERIOR TIBIAL faintly palpable  faintly palpable  Gastrointestinal: soft, nontender, BS WNL, no r/g, no palpable  masses. Musculoskeletal: no muscle atrophy/wasting. M/S 5/5 throughout, extremities without ischemic changes. Skin: No rashes, no ulcers, no cellulitis.   Neurologic:  A&O X 3; appropriate affect, sensation is normal; speech is normal, CN 2-12 intact, pain and light touch intact in extremities, motor exam as listed above. Psychiatric: Normal thought content, mood appropriate to clinical situation.    Assessment: [name] [name] is a 67 y.o. male who had a right ocular stroke about August 2018, his vision returned to normal within a few hours. He has not had any subsequent stroke or TIA.   He has lung cancer with mets to his brain, he is functioning well.   He continues to smoke, started in his teens.  He does not have DM. He takes a daily ASA and a statin.   DATA Carotid Duplex (09-08-18): Right ICA: confirmed occlusion Left ICA: 40-59% stenosis Bilateral vertebral artery flow is antegrade.  Bilateral subclavian artery waveforms are normal.  No change since the exams on 07-18-17 and 03-07-18.    Plan:  Follow-up in 6 months with Carotid Duplex scan.  Over 3 minutes was spent counseling patient re smoking cessation, and patient was given several free resources re smoking cessation.    I discussed in depth with the patient the nature of atherosclerosis, and emphasized the importance of maximal medical management including strict control of blood pressure, blood glucose, and lipid levels, obtaining regular exercise, and cessation of smoking.  The patient is aware that without maximal medical management the underlying atherosclerotic disease process will progress, limiting the benefit of any interventions. The patient was given information about stroke prevention and what symptoms should prompt the patient to seek immediate medical care. Thank you for allowing [place] to participate in this patient's care.  Clemon Chambers, RN, MSN, FNP-C [facility]  [place] Office: [phone]  Clinic Physician: Donzetta Matters  09/08/18 10:41 AM

## 2018-09-08 NOTE — Patient Instructions (Signed)
[facility] Discharge Instructions for Patients Receiving Chemotherapy  Today you received the following chemotherapy agents :  Opdivo.  To help prevent nausea and vomiting after your treatment, we encourage you to take your nausea medication as prescribed.   If you develop nausea and vomiting that is not controlled by your nausea medication, call the clinic.   BELOW ARE SYMPTOMS THAT SHOULD BE REPORTED IMMEDIATELY:  *FEVER GREATER THAN 100.5 F  *CHILLS WITH OR WITHOUT FEVER  NAUSEA AND VOMITING THAT IS NOT CONTROLLED WITH YOUR NAUSEA MEDICATION  *UNUSUAL SHORTNESS OF BREATH  *UNUSUAL BRUISING OR BLEEDING  TENDERNESS IN MOUTH AND THROAT WITH OR WITHOUT PRESENCE OF ULCERS  *URINARY PROBLEMS  *BOWEL PROBLEMS  UNUSUAL RASH Items with * indicate a potential emergency and should be followed up as soon as possible.  Feel free to call the clinic should you have any questions or concerns. The clinic phone number is [phone].  Please show the CHEMO ALERT CARD at check-in to the Emergency Department and triage nurse.   

## 2018-09-12 DIAGNOSIS — Z85118 Personal history of other malignant neoplasm of bronchus and lung: Secondary | ICD-10-CM | POA: Diagnosis not present

## 2018-09-12 DIAGNOSIS — Z720 Tobacco use: Secondary | ICD-10-CM | POA: Diagnosis not present

## 2018-09-12 DIAGNOSIS — J449 Chronic obstructive pulmonary disease, unspecified: Secondary | ICD-10-CM | POA: Diagnosis not present

## 2018-09-12 DIAGNOSIS — R197 Diarrhea, unspecified: Secondary | ICD-10-CM | POA: Diagnosis not present

## 2018-09-12 DIAGNOSIS — K5190 Ulcerative colitis, unspecified, without complications: Secondary | ICD-10-CM | POA: Diagnosis not present

## 2018-09-12 DIAGNOSIS — K5790 Diverticulosis of intestine, part unspecified, without perforation or abscess without bleeding: Secondary | ICD-10-CM | POA: Diagnosis not present

## 2018-09-12 DIAGNOSIS — K5730 Diverticulosis of large intestine without perforation or abscess without bleeding: Secondary | ICD-10-CM | POA: Diagnosis not present

## 2018-09-15 ENCOUNTER — Other Ambulatory Visit: Payer: Self-pay | Admitting: Internal Medicine

## 2018-09-15 DIAGNOSIS — G4700 Insomnia, unspecified: Secondary | ICD-10-CM

## 2018-09-15 DIAGNOSIS — C7800 Secondary malignant neoplasm of unspecified lung: Secondary | ICD-10-CM

## 2018-09-19 ENCOUNTER — Telehealth: Payer: Self-pay | Admitting: Medical Oncology

## 2018-09-19 NOTE — Telephone Encounter (Signed)
Needs Gabapentin Refill and letter to return to work.

## 2018-09-20 ENCOUNTER — Other Ambulatory Visit: Payer: Self-pay | Admitting: *Deleted

## 2018-09-20 ENCOUNTER — Telehealth: Payer: Self-pay | Admitting: Pulmonary Disease

## 2018-09-20 DIAGNOSIS — C7800 Secondary malignant neoplasm of unspecified lung: Secondary | ICD-10-CM

## 2018-09-20 DIAGNOSIS — C7931 Secondary malignant neoplasm of brain: Secondary | ICD-10-CM

## 2018-09-20 DIAGNOSIS — Z5111 Encounter for antineoplastic chemotherapy: Secondary | ICD-10-CM

## 2018-09-20 MED ORDER — GABAPENTIN 300 MG PO CAPS: ORAL_CAPSULE | ORAL | 2 refills | Status: DC

## 2018-09-20 NOTE — Telephone Encounter (Signed)
Pt has been scheduled for OV on 09/29/18 at 2:30 with Dr. Vaughan Browner. Nothing further is needed.

## 2018-09-20 NOTE — Telephone Encounter (Signed)
Refill for gabapentin called into pt pharmacy. Pt requesting letter "return to work" to be faxed to [phone]

## 2018-09-20 NOTE — Telephone Encounter (Signed)
Reviewed with MD who advised pt may not return back to work due to his lung cancer/brain mets. 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 Pt verbalized he needed money ans would not be going back full time. Again, stated MD will not be writing a letter for pt to return to work. No further concerns.

## 2018-09-20 NOTE — Telephone Encounter (Signed)
Called and spoke to patient, requesting a note to return to work. Patient voices that he has been out of work since April of last year for Cancer Treatments and is wanting to return to work. Patient states his Oncology and PCP have written him notes but he also needs pulmonary clearance to return to work. I did explain to the patient that he has not been seen in over a year and this will require a office visit. Patient states if so he would like to be seen within the next two week.  PM please advise if patient needs office visit and if so can he see a NP if you don't have any appointments available.

## 2018-09-20 NOTE — Telephone Encounter (Signed)
Okay to schedule with me at next available.

## 2018-09-25 DIAGNOSIS — H43393 Other vitreous opacities, bilateral: Secondary | ICD-10-CM | POA: Diagnosis not present

## 2018-09-25 DIAGNOSIS — H2513 Age-related nuclear cataract, bilateral: Secondary | ICD-10-CM | POA: Diagnosis not present

## 2018-09-25 DIAGNOSIS — H53121 Transient visual loss, right eye: Secondary | ICD-10-CM | POA: Diagnosis not present

## 2018-09-28 ENCOUNTER — Ambulatory Visit: Payer: Medicare Other | Admitting: Oncology

## 2018-09-28 ENCOUNTER — Other Ambulatory Visit: Payer: Medicare Other

## 2018-09-28 ENCOUNTER — Ambulatory Visit: Payer: Medicare Other

## 2018-09-28 DIAGNOSIS — K529 Noninfective gastroenteritis and colitis, unspecified: Secondary | ICD-10-CM | POA: Diagnosis not present

## 2018-09-28 DIAGNOSIS — Q792 Exomphalos: Secondary | ICD-10-CM | POA: Diagnosis not present

## 2018-09-28 DIAGNOSIS — J449 Chronic obstructive pulmonary disease, unspecified: Secondary | ICD-10-CM | POA: Diagnosis not present

## 2018-09-28 DIAGNOSIS — K5730 Diverticulosis of large intestine without perforation or abscess without bleeding: Secondary | ICD-10-CM | POA: Diagnosis not present

## 2018-09-28 DIAGNOSIS — C7801 Secondary malignant neoplasm of right lung: Secondary | ICD-10-CM | POA: Diagnosis not present

## 2018-09-29 ENCOUNTER — Ambulatory Visit (INDEPENDENT_AMBULATORY_CARE_PROVIDER_SITE_OTHER): Payer: Medicare Other | Admitting: Pulmonary Disease

## 2018-09-29 ENCOUNTER — Encounter: Payer: Self-pay | Admitting: Pulmonary Disease

## 2018-09-29 VITALS — BP 122/84 | HR 80 | Ht 72.0 in | Wt 204.0 lb

## 2018-09-29 DIAGNOSIS — I6523 Occlusion and stenosis of bilateral carotid arteries: Secondary | ICD-10-CM

## 2018-09-29 DIAGNOSIS — F17210 Nicotine dependence, cigarettes, uncomplicated: Secondary | ICD-10-CM

## 2018-09-29 DIAGNOSIS — J449 Chronic obstructive pulmonary disease, unspecified: Secondary | ICD-10-CM

## 2018-09-29 NOTE — Progress Notes (Signed)
[name] [name]    [id number]    26-Jul-1951  Primary Care Physician:System, Pcp Not In  Referring Physician: No referring provider defined for this encounter.  Chief complaint:   Follow-up for metastatic squamous cell lung cancer Bilateral lung mass with brain metastasis COPD  HPI: Mr. Mengel is a 67 year old with medical history of hypertension, lung cancer with mets [place] navigational bronchoscopy by Dr. Lamonte Sakai on 01/19/17 which showed metastatic squamous cell cancer. He follows with Dr. Earlie Server for chemotherapy, immunotherapy with nivolumab. S/p SBRT by Dr. Sondra Come  He used to work as a Administrator and is an active smoker with 45-pack-year smoking history. He denies any cough, sputum production, dyspnea, wheezing. He denies any malaise, fatigue, loss of weight, loss of appetite, hemoptysis.  Interim history: Here for follow-up States that he has dyspnea with activity which is at baseline.  Daily cough with white mucus.  No wheezing, fevers, chills  He needs a letter clearing him to return back to work at the [employer]  He had an admission this month for perforated duodenal ulcer status post laparoscopic repair and is making a slow recovery. He still has some epigastric discomfort but does not have any discharge, fevers, chills. His chemotherapy is currently on hold and he has a follow-up scheduled in the oncology clinic for reassessment.  He denies any dyspnea, cough, sputum production, hemoptysis, wheezing. He is on albuterol rescue inhaler which she uses 1-2 times a week.  Outpatient Encounter Medications as of 09/29/2018  Medication Sig  . acetaminophen (TYLENOL) 325 MG tablet You can take 2 every 4 hours as needed.  You can buy it over the counter DO NOT TAKE MORE THAN 4000 MG OF TYLENOL PER DAY.  IT CAN HARM YOUR LIVER.  Marland Kitchen amitriptyline (ELAVIL) 100 MG tablet Take 1 tablet (100 mg total) by mouth at bedtime.  Marland Kitchen aspirin EC 81 MG tablet Take 81 mg by mouth daily.  Marland Kitchen  gabapentin (NEURONTIN) 300 MG capsule TAKE 2 CAPSULES BY MOUTH 3 TIMES DAILY  . hydrochlorothiazide (HYDRODIURIL) 25 MG tablet Restart this after you see your Primary care doctor.  Check your blood pressure at home and record.  Marland Kitchen lisinopril (PRINIVIL,ZESTRIL) 40 MG tablet Take 40 mg by mouth daily.  . metoprolol succinate (TOPROL-XL) 50 MG 24 hr tablet 50 mg.  . metroNIDAZOLE (FLAGYL) 500 MG tablet Take 1 tablet by mouth every 8 (eight) hours as needed.  . mirtazapine (REMERON) 30 MG tablet Take 1 tablet (30 mg total) by mouth at bedtime.  . pantoprazole (PROTONIX) 40 MG tablet Take 1 tablet (40 mg total) by mouth 2 (two) times daily.  . ranitidine (ZANTAC) 150 MG tablet Take 150 mg by mouth 2 (two) times daily.  . simvastatin (ZOCOR) 5 MG tablet Take 1 tablet (5 mg total) by mouth daily.  . temazepam (RESTORIL) 30 MG capsule TAKE 1 CAPSULE BY MOUTH AT BEDTIME AS NEEDED FOR SLEEP   No facility-administered encounter medications on file as of 09/29/2018.    Physical Exam: Blood pressure 126/76, pulse 98, height 5\' 8"  (1.727 m), weight 205 lb 12.8 oz (93.4 kg), SpO2 93 %. Gen:      No acute distress HEENT:  EOMI, sclera anicteric Neck:     No masses; no thyromegaly Lungs:    Clear to auscultation bilaterally; normal respiratory effort CV:         Regular rate and rhythm; no murmurs Abd:      Epigastric dressing,  no distension Ext:    No edema; adequate peripheral perfusion Skin:      Warm and dry; no rash Neuro: alert and oriented x 3 Psych: normal mood and affect  Data Reviewed: Imaging CT chest 12/22/16 3 cm right upper lobes spiculated mass, 1.3 cm right upper lobe spiculated nodule, 2 cm left upper lobe spiculated nodule and 6 mm left upper lobe spiculated nodule.  PET scan 11/20/31 Hypermetabolic 3.2 cm right upper lobe nodule. Bilateral upper lobe lung nodules with PET uptake No lymphadenopathy or distant metastatic disease I have reviewed all images  personally.  PFTs 09/29/2018- FVC 2.2 [45%), FEV1 1.3 [36%), F/F 60 Severe obstruction.  Path Surgical pathology 01/19/17  Squamous cell cancer  Assessment:  Severe COPD Spirometry reviewed with severe obstruction We will start him on Stiolto inhaler Give letter clearing him to return to work.  Metastatic squamous cell cancer of lung s/p chemo, immunotherapy and SBRT Follows with Dr. Julien Nordmann.  Smoker Still continues to smoke 3 cigarettes a day.  Not ready to quit.  Reassess at return visit.  Time spent counseling-5 minutes  Health maintenance Refused to get flu and Pneumovax.  Plan/Recommendations: - Start [place] to return to work. - Smoking cessation  Marshell Garfinkel MD [facility] 09/29/2018, 2:34 PM  CC: No ref. provider found

## 2018-09-29 NOTE — Patient Instructions (Signed)
I have reviewed your lung function tests which do confirm severe COPD We will start you on an inhaler called Stiolto  We will give a letter stating that it is okay from pulmonary viewpoint to return to work Follow-up in 3 months.

## 2018-10-05 ENCOUNTER — Ambulatory Visit
Admission: RE | Admit: 2018-10-05 | Discharge: 2018-10-05 | Disposition: A | Discharge status: Home / Self Care | Payer: Medicare Other | Source: Ambulatory Visit | Attending: Radiation Oncology | Admitting: Radiation Oncology

## 2018-10-05 ENCOUNTER — Inpatient Hospital Stay: Payer: Medicare Other

## 2018-10-05 ENCOUNTER — Encounter: Payer: Self-pay | Admitting: Internal Medicine

## 2018-10-05 ENCOUNTER — Inpatient Hospital Stay (HOSPITAL_BASED_OUTPATIENT_CLINIC_OR_DEPARTMENT_OTHER): Payer: Medicare Other | Admitting: Internal Medicine

## 2018-10-05 ENCOUNTER — Other Ambulatory Visit: Payer: Self-pay | Admitting: Internal Medicine

## 2018-10-05 ENCOUNTER — Encounter: Payer: Self-pay | Admitting: Radiation Oncology

## 2018-10-05 ENCOUNTER — Telehealth: Payer: Self-pay | Admitting: Internal Medicine

## 2018-10-05 ENCOUNTER — Inpatient Hospital Stay: Payer: Medicare Other | Attending: Internal Medicine

## 2018-10-05 ENCOUNTER — Other Ambulatory Visit: Payer: Self-pay

## 2018-10-05 VITALS — BP 130/80 | HR 77 | Temp 97.7°F | Resp 20 | Ht 72.0 in | Wt 210.6 lb

## 2018-10-05 VITALS — BP 130/80 | HR 77 | Temp 97.7°F | Resp 20 | Ht 73.0 in | Wt 210.6 lb

## 2018-10-05 DIAGNOSIS — C3411 Malignant neoplasm of upper lobe, right bronchus or lung: Secondary | ICD-10-CM

## 2018-10-05 DIAGNOSIS — C7802 Secondary malignant neoplasm of left lung: Secondary | ICD-10-CM | POA: Diagnosis not present

## 2018-10-05 DIAGNOSIS — Z8673 Personal history of transient ischemic attack (TIA), and cerebral infarction without residual deficits: Secondary | ICD-10-CM | POA: Insufficient documentation

## 2018-10-05 DIAGNOSIS — C7800 Secondary malignant neoplasm of unspecified lung: Secondary | ICD-10-CM

## 2018-10-05 DIAGNOSIS — G620 Drug-induced polyneuropathy: Secondary | ICD-10-CM

## 2018-10-05 DIAGNOSIS — Z7982 Long term (current) use of aspirin: Secondary | ICD-10-CM | POA: Diagnosis not present

## 2018-10-05 DIAGNOSIS — Z79899 Other long term (current) drug therapy: Secondary | ICD-10-CM | POA: Insufficient documentation

## 2018-10-05 DIAGNOSIS — Z5112 Encounter for antineoplastic immunotherapy: Secondary | ICD-10-CM | POA: Insufficient documentation

## 2018-10-05 DIAGNOSIS — C3412 Malignant neoplasm of upper lobe, left bronchus or lung: Secondary | ICD-10-CM | POA: Insufficient documentation

## 2018-10-05 DIAGNOSIS — I10 Essential (primary) hypertension: Secondary | ICD-10-CM

## 2018-10-05 DIAGNOSIS — Z923 Personal history of irradiation: Secondary | ICD-10-CM | POA: Diagnosis not present

## 2018-10-05 DIAGNOSIS — C3490 Malignant neoplasm of unspecified part of unspecified bronchus or lung: Secondary | ICD-10-CM | POA: Insufficient documentation

## 2018-10-05 LAB — CMP (CANCER CENTER ONLY)
ALBUMIN: 4 g/dL (ref 3.5–5.0)
ALT: 14 U/L (ref 0–44)
AST: 13 U/L — ABNORMAL LOW (ref 15–41)
Alkaline Phosphatase: 86 U/L (ref 38–126)
Anion gap: 10 (ref 5–15)
BILIRUBIN TOTAL: 0.5 mg/dL (ref 0.3–1.2)
BUN: 13 mg/dL (ref 8–23)
CO2: 26 mmol/L (ref 22–32)
Calcium: 9.4 mg/dL (ref 8.9–10.3)
Chloride: 101 mmol/L (ref 98–111)
Creatinine: 0.96 mg/dL (ref 0.61–1.24)
GFR, Est AFR Am: >60 mL/min (ref >60)
GFR, Estimated: >60 mL/min (ref >60)
GLUCOSE: 100 mg/dL — AB (ref 70–99)
POTASSIUM: 4.4 mmol/L (ref 3.5–5.1)
Sodium: 137 mmol/L (ref 135–145)
Total Protein: 8.1 g/dL (ref 6.5–8.1)

## 2018-10-05 LAB — CBC WITH DIFFERENTIAL (CANCER CENTER ONLY)
ABS IMMATURE GRANULOCYTES: 0.06 10*3/uL (ref 0.00–0.07)
Basophils Absolute: 0.1 10*3/uL (ref 0.0–0.1)
Basophils Relative: 1 %
Eosinophils Absolute: 0.2 10*3/uL (ref 0.0–0.5)
Eosinophils Relative: 2 %
HCT: 46.9 % (ref 39.0–52.0)
Hemoglobin: 15.1 g/dL (ref 13.0–17.0)
Immature Granulocytes: 1 %
Lymphocytes Relative: 34 %
Lymphs Abs: 3.7 10*3/uL (ref 0.7–4.0)
MCH: 28.9 pg (ref 26.0–34.0)
MCHC: 32.2 g/dL (ref 30.0–36.0)
MCV: 89.7 fL (ref 80.0–100.0)
Monocytes Absolute: 0.9 10*3/uL (ref 0.1–1.0)
Monocytes Relative: 8 %
Neutro Abs: 6.1 10*3/uL (ref 1.7–7.7)
Neutrophils Relative %: 54 %
PLATELETS: 276 10*3/uL (ref 150–400)
RBC: 5.23 MIL/uL (ref 4.22–5.81)
RDW: 13.7 % (ref 11.5–15.5)
WBC Count: 11 10*3/uL — ABNORMAL HIGH (ref 4.0–10.5)
nRBC: 0 % (ref 0.0–0.2)

## 2018-10-05 LAB — TSH: TSH: 5.292 u[IU]/mL — ABNORMAL HIGH (ref 0.320–4.118)

## 2018-10-05 MED ORDER — SODIUM CHLORIDE 0.9 % IV SOLN
480.0000 mg | Freq: Once | INTRAVENOUS | Status: AC
  Administered 2018-10-05: 480 mg via INTRAVENOUS
  Filled 2018-10-05: qty 48

## 2018-10-05 MED ORDER — SODIUM CHLORIDE 0.9 % IV SOLN
Freq: Once | INTRAVENOUS | Status: AC
  Administered 2018-10-05: 13:00:00 via INTRAVENOUS
  Filled 2018-10-05: qty 250

## 2018-10-05 NOTE — Progress Notes (Signed)
Pt here today for a follow-up for lung cancer. Pt denies having any pain. Pt states mild fatigue. Pt denies having a cough or hemoptysis. Pt states mild shortness of breath on exertion. Pt denies having any painful or difficulty swallowing. Pt denies having any skin changes.   BP 130/80 (BP Location: Right Arm, Patient Position: Sitting)   Pulse 77   Temp 97.7 F (36.5 C) (Oral)   Resp 20   Ht 6' (1.829 m)   Wt 210 lb 9.6 oz (95.5 kg)   SpO2 96%   BMI 28.56 kg/m    Wt Readings from Last 3 Encounters:  10/05/18 210 lb 9.6 oz (95.5 kg)  09/29/18 204 lb (92.5 kg)  09/08/18 207 lb (93.9 kg)

## 2018-10-05 NOTE — Telephone Encounter (Signed)
Printed calendar and avs. °

## 2018-10-05 NOTE — Patient Instructions (Signed)
[facility] Discharge Instructions for Patients Receiving Chemotherapy  Today you received the following chemotherapy agents :  Opdivo.  To help prevent nausea and vomiting after your treatment, we encourage you to take your nausea medication as prescribed.   If you develop nausea and vomiting that is not controlled by your nausea medication, call the clinic.   BELOW ARE SYMPTOMS THAT SHOULD BE REPORTED IMMEDIATELY:  *FEVER GREATER THAN 100.5 F  *CHILLS WITH OR WITHOUT FEVER  NAUSEA AND VOMITING THAT IS NOT CONTROLLED WITH YOUR NAUSEA MEDICATION  *UNUSUAL SHORTNESS OF BREATH  *UNUSUAL BRUISING OR BLEEDING  TENDERNESS IN MOUTH AND THROAT WITH OR WITHOUT PRESENCE OF ULCERS  *URINARY PROBLEMS  *BOWEL PROBLEMS  UNUSUAL RASH Items with * indicate a potential emergency and should be followed up as soon as possible.  Feel free to call the clinic should you have any questions or concerns. The clinic phone number is [phone].  Please show the CHEMO ALERT CARD at check-in to the Emergency Department and triage nurse.   

## 2018-10-05 NOTE — Progress Notes (Signed)
MD reviewed TSH/cbc/cmet, VO ok to treat despite labs.

## 2018-10-05 NOTE — Progress Notes (Signed)
[name] [name] Telephone:([phone]) [phone]   Fax:([phone]) [phone]  OFFICE PROGRESS NOTE  System, Pcp Not In No address on file  DIAGNOSIS: Stage IV (T3, N0, M1 a) non-small cell lung cancer, squamous cell carcinoma presented with multiple bilateral pulmonary nodules diagnosed in April 2018.  PRIOR THERAPY:  Systemic chemotherapy with carboplatin for AUC of 5 and paclitaxel 175 MG/M2 every 3 weeks with Neulasta support. Status post 6 cycles.  CURRENT THERAPY: Second line treatment with immunotherapy was Nivolumab 480 mg IV every 4 weeks.  First cycle November 14, 2017.  Status post 10 cycles.  INTERVAL HISTORY: [name] [name] 67 y.o. male returns to the clinic today for follow-up visit.  The patient is feeling fine today with no concerning complaints except for mild neuropathy.  He denied having any chest pain, shortness of breath, cough or hemoptysis.  He has no nausea, vomiting, diarrhea or constipation.  He denied having any headache or visual changes.  He continues to tolerate his treatment with nivolumab fairly well.  He is here today for evaluation before starting cycle #11.  MEDICAL HISTORY: Past Medical History:  Diagnosis Date  . Depression 01/29/2017  . Dyspnea   . Encounter for antineoplastic chemotherapy 01/29/2017  . Goals of care, counseling/discussion 01/29/2017  . Hypertension   . Metastatic squamous cell carcinoma to lung, unspecified laterality ([place]) 01/28/2017  . Stroke [facility])     ALLERGIES:  is allergic to no known allergies.  MEDICATIONS:  Current Outpatient Medications  Medication Sig Dispense Refill  . acetaminophen (TYLENOL) 325 MG tablet You can take 2 every 4 hours as needed.  You can buy it over the counter DO NOT TAKE MORE THAN 4000 MG OF TYLENOL PER DAY.  IT CAN HARM YOUR LIVER.    Marland Kitchen amitriptyline (ELAVIL) 100 MG tablet Take 1 tablet (100 mg total) by mouth at bedtime. 30 tablet 3  . aspirin EC 81 MG tablet Take 81 mg by mouth daily.    Marland Kitchen  gabapentin (NEURONTIN) 300 MG capsule TAKE 2 CAPSULES BY MOUTH 3 TIMES DAILY 90 capsule 2  . hydrochlorothiazide (HYDRODIURIL) 25 MG tablet Restart this after you see your Primary care doctor.  Check your blood pressure at home and record.    Marland Kitchen lisinopril (PRINIVIL,ZESTRIL) 40 MG tablet Take 40 mg by mouth daily.    . metoprolol succinate (TOPROL-XL) 50 MG 24 hr tablet 50 mg.  0  . mirtazapine (REMERON) 30 MG tablet Take 1 tablet (30 mg total) by mouth at bedtime. 30 tablet 1  . pantoprazole (PROTONIX) 40 MG tablet Take 1 tablet (40 mg total) by mouth 2 (two) times daily. 60 tablet 1  . ranitidine (ZANTAC) 150 MG tablet Take 150 mg by mouth 2 (two) times daily.    . simvastatin (ZOCOR) 5 MG tablet Take 1 tablet (5 mg total) by mouth daily. 5 tablet 3  . temazepam (RESTORIL) 30 MG capsule TAKE 1 CAPSULE BY MOUTH AT BEDTIME AS NEEDED FOR SLEEP 30 capsule 0   No current facility-administered medications for this visit.     SURGICAL HISTORY:  Past Surgical History:  Procedure Laterality Date  . BACK SURGERY     cyst removal  . COLONOSCOPY    . LAPAROTOMY N/A 02/25/2017   Procedure: EXPLORATORY LAPAROTOMY MODIFIED GRAHAM'S PATCH;  Surgeon: Kinsinger, Arta Bruce, MD;  Location: [facility];  Service: General;  Laterality: N/A;  . MINOR PLACEMENT OF FIDUCIAL N/A 01/19/2017   Procedure: MINOR PLACEMENT OF FIDUCIAL x  6;  Surgeon: Collene Gobble, MD;  Location: [place];  Service: Thoracic;  Laterality: N/A;  . VIDEO BRONCHOSCOPY WITH ENDOBRONCHIAL NAVIGATION N/A 01/19/2017   Procedure: VIDEO BRONCHOSCOPY WITH ENDOBRONCHIAL NAVIGATION;  Surgeon: Collene Gobble, MD;  Location: [place];  Service: Thoracic;  Laterality: N/A;    REVIEW OF SYSTEMS:  A comprehensive review of systems was negative except for: Neurological: positive for paresthesia   PHYSICAL EXAMINATION: General appearance: alert, cooperative and no distress Head: Normocephalic, without obvious abnormality, atraumatic Neck: no adenopathy, no JVD,  supple, symmetrical, trachea midline and thyroid not enlarged, symmetric, no tenderness/mass/nodules Lymph nodes: Cervical, supraclavicular, and axillary nodes normal. Resp: clear to auscultation bilaterally Back: symmetric, no curvature. ROM normal. No CVA tenderness. Cardio: regular rate and rhythm, S1, S2 normal, no murmur, click, rub or gallop GI: soft, non-tender; bowel sounds normal; no masses,  no organomegaly Extremities: extremities normal, atraumatic, no cyanosis or edema  ECOG PERFORMANCE STATUS: 1 - Symptomatic but completely ambulatory   Blood pressure 130/80, pulse 77, temperature 97.7 F (36.5 C), temperature source Oral, resp. rate 20, height 6\' 1"  (1.854 m), weight 210 lb 9.6 oz (95.5 kg), SpO2 96 %.  LABORATORY DATA: Lab Results  Component Value Date   WBC 11.0 (H) 10/05/2018   HGB 15.1 10/05/2018   HCT 46.9 10/05/2018   MCV 89.7 10/05/2018   PLT 276 10/05/2018      Chemistry      Component Value Date/Time   NA 137 10/05/2018 1023   NA 139 10/06/2017 1052   K 4.4 10/05/2018 1023   K 4.2 10/06/2017 1052   CL 101 10/05/2018 1023   CO2 26 10/05/2018 1023   CO2 25 10/06/2017 1052   BUN 13 10/05/2018 1023   BUN 13.2 10/06/2017 1052   CREATININE 0.96 10/05/2018 1023   CREATININE 0.9 10/06/2017 1052      Component Value Date/Time   CALCIUM 9.4 10/05/2018 1023   CALCIUM 9.5 10/06/2017 1052   ALKPHOS 86 10/05/2018 1023   ALKPHOS 68 10/06/2017 1052   AST 13 (L) 10/05/2018 1023   AST 14 10/06/2017 1052   ALT 14 10/05/2018 1023   ALT 13 10/06/2017 1052   BILITOT 0.5 10/05/2018 1023   BILITOT 0.44 10/06/2017 1052       RADIOGRAPHIC STUDIES: Vas US Carotid  Result Date: 09/08/2018 Carotid Arterial Duplex Study Indications:       Carotid artery disease and Known right ICA occlusion. Risk Factors:      Hypertension, current smoker. Comparison Study:  No change since prior exam of 03/08/2018 Performing Technologist: Alvia Grove RVT  Examination Guidelines: A  complete evaluation includes B-mode imaging, spectral Doppler, color Doppler, and power Doppler as needed of all accessible portions of each vessel. Bilateral testing is considered an integral part of a complete examination. Limited examinations for reoccurring indications may be performed as noted.  Right Carotid Findings: +----------+--------+--------+--------+------------+--------+           PSV cm/sEDV cm/sStenosisDescribe    Comments +----------+--------+--------+--------+------------+--------+ CCA Prox  67      0                                    +----------+--------+--------+--------+------------+--------+ CCA Mid   55      7                                    +----------+--------+--------+--------+------------+--------+  CCA Distal35      6               heterogenous         +----------+--------+--------+--------+------------+--------+ ICA Prox                  Occludedheterogenous         +----------+--------+--------+--------+------------+--------+ ICA Mid                   Occludedheterogenous         +----------+--------+--------+--------+------------+--------+ ICA Distal                Occludedheterogenous         +----------+--------+--------+--------+------------+--------+ ECA       67      11                                   +----------+--------+--------+--------+------------+--------+ +----------+--------+-------+----------------+-------------------+           PSV cm/sEDV cmsDescribe        Arm Pressure (mmHG) +----------+--------+-------+----------------+-------------------+ [id number]             Multiphasic, WNL                    +----------+--------+-------+----------------+-------------------+ +---------+--------+--+--------+--+---------+ VertebralPSV cm/s50EDV cm/s19Antegrade +---------+--------+--+--------+--+---------+  Left Carotid Findings: +----------+--------+--------+--------+------------+--------+            PSV cm/sEDV cm/sStenosisDescribe    Comments +----------+--------+--------+--------+------------+--------+ CCA Prox  91      24                                   +----------+--------+--------+--------+------------+--------+ CCA Mid   88      28                                   +----------+--------+--------+--------+------------+--------+ CCA Distal72      25                                   +----------+--------+--------+--------+------------+--------+ ICA Prox  130     51              heterogenous         +----------+--------+--------+--------+------------+--------+ ICA Mid   92      43                                   +----------+--------+--------+--------+------------+--------+ ICA Distal80      36                                   +----------+--------+--------+--------+------------+--------+ ECA       64      15              heterogenous         +----------+--------+--------+--------+------------+--------+ +----------+--------+--------+----------------+-------------------+ SubclavianPSV cm/sEDV cm/sDescribe        Arm Pressure (mmHG) +----------+--------+--------+----------------+-------------------+           131     3       Multiphasic, WNL                    +----------+--------+--------+----------------+-------------------+ +---------+--------+--+--------+--+---------+  VertebralPSV cm/s40EDV cm/s19Antegrade +---------+--------+--+--------+--+---------+  Summary: Right Carotid: Velocities in the right ICA are consistent with a total                occlusion. Left Carotid: Velocities in the left ICA are consistent with a 40-59% stenosis. Vertebrals:  Bilateral vertebral arteries demonstrate antegrade flow. Subclavians: Normal flow hemodynamics were seen in bilateral subclavian              arteries. *See table(s) above for measurements and observations.  Electronically signed by Servando Snare MD on 09/08/2018 at 1:27:33 PM.    Final      ASSESSMENT AND PLAN:  This is a very pleasant 67 years old white male with stage IV non-small cell lung cancer, squamous cell carcinoma presented with multiple bilateral pulmonary nodules. The patient was treated with systemic chemotherapy with carboplatin and paclitaxel status post 6 cycles. He tolerated the last cycle of his treatment well except for the persistent peripheral neuropathy. The patient has been in observation for the last 3 months. He had a repeat CT scan of the chest, abdomen and pelvis performed recently.  Has a scan showed mild interval enlargement of left upper lobe as well as right upper lobe pulmonary nodules concerning for disease progression. The patient was started on treatment with immunotherapy with Nivolumab 480 mg IV every 4 weeks status post 10 cycles.   The patient is tolerating his treatment well with no concerning complaints. I recommended for him to proceed with cycle #11 today as scheduled. For the peripheral neuropathy he will continue his current treatment with Neurontin 600 mg p.o. 3 times daily. 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 The patient will come back for follow-up visit in 4 weeks for evaluation before starting cycle #12. He was advised to call immediately if he has any concerning symptoms in the interval. The patient voices understanding of current disease status and treatment options and is in agreement with the current care plan. All questions were answered. The patient knows to call the clinic with any problems, questions or concerns. We can certainly see the patient much sooner if necessary.  Disclaimer: This note was dictated with voice recognition software. Similar sounding words can inadvertently be transcribed and may not be corrected upon  review.

## 2018-10-05 NOTE — Progress Notes (Signed)
Radiation Oncology         [phone] ________________________________  Name: [name] [name] MRN: [id number]  Date: 10/05/2018  DOB: [birth year]  Follow-Up Visit Note  CC: System, Pcp Not In  [name], MD    ICD-10-CM   1. Metastatic squamous cell carcinoma to lung, unspecified laterality (HCC) C78.00     Diagnosis:   Stage IV (T3, N0, M1a) non-small cell lung cancer, squamous cell carcinoma presented with multiple bilateral pulmonary nodules diagnosed in April 2018, now witholigometastasis in the left lung    Interval Since Last Radiation:  1 months  Radiation treatment dates:  08/29/18-09/05/18  Site/dose:   SBRT Lung; 18 Gy in 3 fractions for a total of 54 Gy  Narrative:  The patient returns today for routine follow-up.  he is doing well overall. He has an infusion today with Dr. Julien Nordmann.           On review of systems, he reports feeling well. he denies chest discomfort, coughing and any other symptoms. Pertinent positives are listed and detailed within the above HPI.                 ALLERGIES:  is allergic to no known allergies.  Meds: Current Outpatient Medications  Medication Sig Dispense Refill  . acetaminophen (TYLENOL) 325 MG tablet You can take 2 every 4 hours as needed.  You can buy it over the counter DO NOT TAKE MORE THAN 4000 MG OF TYLENOL PER DAY.  IT CAN HARM YOUR LIVER.    Marland Kitchen amitriptyline (ELAVIL) 100 MG tablet Take 1 tablet (100 mg total) by mouth at bedtime. 30 tablet 3  . aspirin EC 81 MG tablet Take 81 mg by mouth daily.    Marland Kitchen gabapentin (NEURONTIN) 300 MG capsule TAKE 2 CAPSULES BY MOUTH 3 TIMES DAILY 90 capsule 2  . hydrochlorothiazide (HYDRODIURIL) 25 MG tablet Restart this after you see your Primary care doctor.  Check your blood pressure at home and record.    Marland Kitchen lisinopril (PRINIVIL,ZESTRIL) 40 MG tablet Take 40 mg by mouth daily.    . metoprolol succinate (TOPROL-XL) 50 MG 24 hr tablet 50 mg.  0  . metroNIDAZOLE (FLAGYL) 500 MG tablet  Take 1 tablet by mouth every 8 (eight) hours as needed.  0  . mirtazapine (REMERON) 30 MG tablet Take 1 tablet (30 mg total) by mouth at bedtime. 30 tablet 1  . pantoprazole (PROTONIX) 40 MG tablet Take 1 tablet (40 mg total) by mouth 2 (two) times daily. 60 tablet 1  . ranitidine (ZANTAC) 150 MG tablet Take 150 mg by mouth 2 (two) times daily.    . simvastatin (ZOCOR) 5 MG tablet Take 1 tablet (5 mg total) by mouth daily. 5 tablet 3  . temazepam (RESTORIL) 30 MG capsule TAKE 1 CAPSULE BY MOUTH AT BEDTIME AS NEEDED FOR SLEEP 30 capsule 0   No current facility-administered medications for this encounter.     Physical Findings: The patient is in no acute distress. Patient is alert and oriented.  height is 6' (1.829 m) and weight is 210 lb 9.6 oz (95.5 kg). His oral temperature is 97.7 F (36.5 C). His blood pressure is 130/80 and his pulse is 77. His respiration is 20 and oxygen saturation is 96%. .  No significant changes. Lungs are clear to auscultation bilaterally. Heart has regular rate and rhythm. No palpable cervical, supraclavicular, or axillary adenopathy. Abdomen soft, non-tender, normal bowel sounds.    Lab Findings: Lab Results  Component Value Date   WBC 11.0 (H) 10/05/2018   HGB 15.1 10/05/2018   HCT 46.9 10/05/2018   MCV 89.7 10/05/2018   PLT 276 10/05/2018    Radiographic Findings: Vas US Carotid  Result Date: 09/08/2018 Carotid Arterial Duplex Study Indications:       Carotid artery disease and Known right ICA occlusion. Risk Factors:      Hypertension, current smoker. Comparison Study:  No change since prior exam of 03/08/2018 Performing Technologist: Alvia Grove RVT  Examination Guidelines: A complete evaluation includes B-mode imaging, spectral Doppler, color Doppler, and power Doppler as needed of all accessible portions of each vessel. Bilateral testing is considered an integral part of a complete examination. Limited examinations for reoccurring indications may be  performed as noted.  Right Carotid Findings: +----------+--------+--------+--------+------------+--------+           PSV cm/sEDV cm/sStenosisDescribe    Comments +----------+--------+--------+--------+------------+--------+ CCA Prox  67      0                                    +----------+--------+--------+--------+------------+--------+ CCA Mid   55      7                                    +----------+--------+--------+--------+------------+--------+ CCA Distal35      6               heterogenous         +----------+--------+--------+--------+------------+--------+ ICA Prox                  Occludedheterogenous         +----------+--------+--------+--------+------------+--------+ ICA Mid                   Occludedheterogenous         +----------+--------+--------+--------+------------+--------+ ICA Distal                Occludedheterogenous         +----------+--------+--------+--------+------------+--------+ ECA       67      11                                   +----------+--------+--------+--------+------------+--------+ +----------+--------+-------+----------------+-------------------+           PSV cm/sEDV cmsDescribe        Arm Pressure (mmHG) +----------+--------+-------+----------------+-------------------+ [id number]             Multiphasic, WNL                    +----------+--------+-------+----------------+-------------------+ +---------+--------+--+--------+--+---------+ VertebralPSV cm/s50EDV cm/s19Antegrade +---------+--------+--+--------+--+---------+  Left Carotid Findings: +----------+--------+--------+--------+------------+--------+           PSV cm/sEDV cm/sStenosisDescribe    Comments +----------+--------+--------+--------+------------+--------+ CCA Prox  91      24                                   +----------+--------+--------+--------+------------+--------+ CCA Mid   88      28                                    +----------+--------+--------+--------+------------+--------+ CCA Distal72  25                                   +----------+--------+--------+--------+------------+--------+ ICA Prox  130     51              heterogenous         +----------+--------+--------+--------+------------+--------+ ICA Mid   92      43                                   +----------+--------+--------+--------+------------+--------+ ICA Distal80      36                                   +----------+--------+--------+--------+------------+--------+ ECA       64      15              heterogenous         +----------+--------+--------+--------+------------+--------+ +----------+--------+--------+----------------+-------------------+ SubclavianPSV cm/sEDV cm/sDescribe        Arm Pressure (mmHG) +----------+--------+--------+----------------+-------------------+           131     3       Multiphasic, WNL                    +----------+--------+--------+----------------+-------------------+ +---------+--------+--+--------+--+---------+ VertebralPSV cm/s40EDV cm/s19Antegrade +---------+--------+--+--------+--+---------+  Summary: Right Carotid: Velocities in the right ICA are consistent with a total                occlusion. Left Carotid: Velocities in the left ICA are consistent with a 40-59% stenosis. Vertebrals:  Bilateral vertebral arteries demonstrate antegrade flow. Subclavians: Normal flow hemodynamics were seen in bilateral subclavian              arteries. *See table(s) above for measurements and observations.  Electronically signed by Servando Snare MD on 09/08/2018 at 1:27:33 PM.    Final     Impression:  The patient is recovering from the effects of radiation.  He did not have any side effects that we can tell.   Plan:  Follow-up in radiation oncology in 3 months. We will consider ordering a chest CT scan at that time if not already ordered by Dr. Julien Nordmann.    ____________________________________   Blair Promise, PhD, MD    This document serves as a record of services personally performed by [name], MD. It was created on his behalf by Mary-Margaret Loma Messing, a trained medical scribe. The creation of this record is based on the scribe's personal observations and the provider's statements to them. This document has been checked and approved by the attending provider.

## 2018-10-06 ENCOUNTER — Telehealth: Payer: Self-pay | Admitting: *Deleted

## 2018-10-06 NOTE — Telephone Encounter (Signed)
Mr Terrance left a message wanting to know if you have arranged an appt with a neurologist.

## 2018-10-08 NOTE — Telephone Encounter (Signed)
I asked Stanton Kidney to get him an appointment with Dr. Mickeal Skinner.

## 2018-10-09 ENCOUNTER — Telehealth: Payer: Self-pay | Admitting: Internal Medicine

## 2018-10-09 NOTE — Telephone Encounter (Signed)
Scheduled appt per 12/23 sch message - left message for patient with appt date and time

## 2018-10-13 ENCOUNTER — Ambulatory Visit: Payer: Medicare Other | Admitting: Internal Medicine

## 2018-10-13 ENCOUNTER — Encounter: Payer: Self-pay | Admitting: Internal Medicine

## 2018-10-13 ENCOUNTER — Inpatient Hospital Stay (HOSPITAL_BASED_OUTPATIENT_CLINIC_OR_DEPARTMENT_OTHER): Payer: Medicare Other | Admitting: Internal Medicine

## 2018-10-13 ENCOUNTER — Telehealth: Payer: Self-pay

## 2018-10-13 VITALS — BP 131/81 | HR 94 | Temp 98.4°F | Resp 18 | Ht 73.0 in | Wt 210.4 lb

## 2018-10-13 DIAGNOSIS — G620 Drug-induced polyneuropathy: Secondary | ICD-10-CM

## 2018-10-13 DIAGNOSIS — Z8673 Personal history of transient ischemic attack (TIA), and cerebral infarction without residual deficits: Secondary | ICD-10-CM | POA: Diagnosis not present

## 2018-10-13 DIAGNOSIS — C7931 Secondary malignant neoplasm of brain: Secondary | ICD-10-CM

## 2018-10-13 DIAGNOSIS — C3411 Malignant neoplasm of upper lobe, right bronchus or lung: Secondary | ICD-10-CM | POA: Diagnosis not present

## 2018-10-13 DIAGNOSIS — C3412 Malignant neoplasm of upper lobe, left bronchus or lung: Secondary | ICD-10-CM

## 2018-10-13 DIAGNOSIS — Z79899 Other long term (current) drug therapy: Secondary | ICD-10-CM | POA: Diagnosis not present

## 2018-10-13 DIAGNOSIS — Z5112 Encounter for antineoplastic immunotherapy: Secondary | ICD-10-CM | POA: Diagnosis not present

## 2018-10-13 NOTE — Telephone Encounter (Signed)
Per 12/27 no los

## 2018-10-13 NOTE — Telephone Encounter (Signed)
Per Dr. Mickeal Skinner, patient is ok to go back to work.  Patient was notified.

## 2018-10-13 NOTE — Telephone Encounter (Signed)
Patient called to ask if we can send him a letter stating that he is able to go back to work.  Dr. Julien Nordmann does not feel this is the safe thing to do.  Patient asked if Dr. Mickeal Skinner wrote a letter stating he can go back to work.  I have not found said letter but will continue to investigate.

## 2018-10-13 NOTE — Telephone Encounter (Signed)
Patient called to ask if we could fax a letter to [phone] stating he is able to return to work.  I reprinted a previous note from Armanda Magic, RN stating this.  I will fax it to the patient when Dr. Julien Nordmann signs it.
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
## 2018-10-15 ENCOUNTER — Other Ambulatory Visit: Payer: Self-pay | Admitting: Internal Medicine

## 2018-10-15 DIAGNOSIS — C7800 Secondary malignant neoplasm of unspecified lung: Secondary | ICD-10-CM

## 2018-10-15 DIAGNOSIS — G4700 Insomnia, unspecified: Secondary | ICD-10-CM

## 2018-10-16 ENCOUNTER — Other Ambulatory Visit: Payer: Self-pay | Admitting: *Deleted

## 2018-10-16 NOTE — Telephone Encounter (Signed)
Notified pt Refill sent 12/30 ready for pick up

## 2018-10-23 IMAGING — CT CT CHEST SUPER D W/O CM
2 of 4 series · 15 of 36 positions shown, 18 images · non-contrast
Comparison: PET-CT on 01/03/2017

CLINICAL DATA: Bilateral pulmonary nodules. Electro magnetic
bronchoscopy planning.

EXAM:
CT CHEST WITHOUT CONTRAST
TECHNIQUE: Multidetector CT imaging of the chest was performed using thin slice
collimation for electromagnetic bronchoscopy planning purposes,
without intravenous contrast.

[Series 4: thins · axial · 0.78mm/px · z∈[-371,-54]mm · 12 of 444 slices shown, 15 images]
[im 24/444  mediastinal]
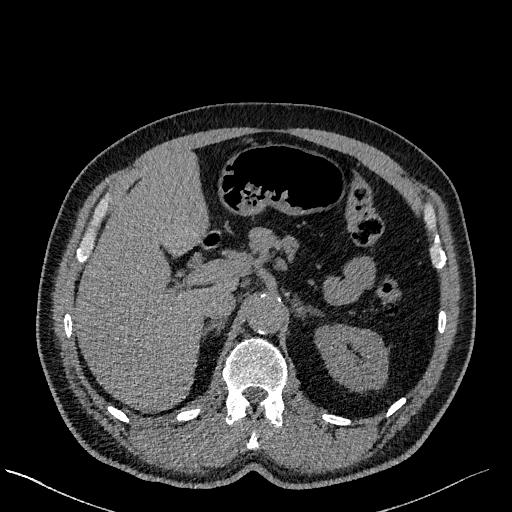
[im 24/444  lung]
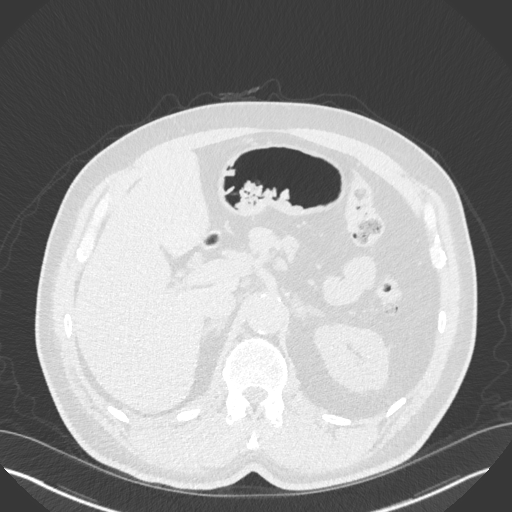
[im 70/444  lung]
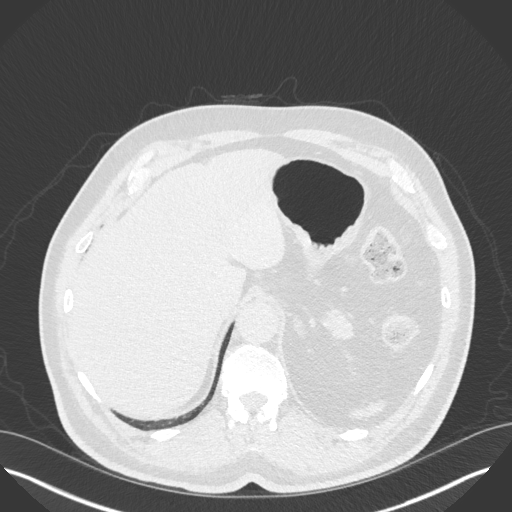
[im 94/444  lung]
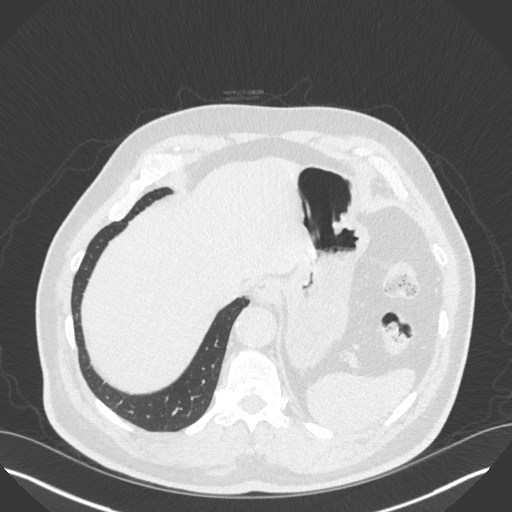
[im 140/444  lung]
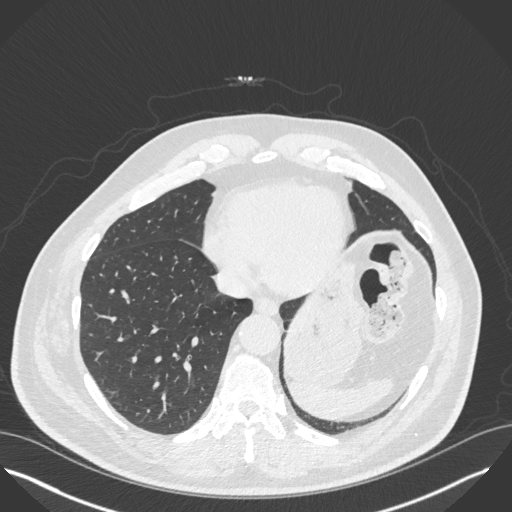
[im 164/444  mediastinal]
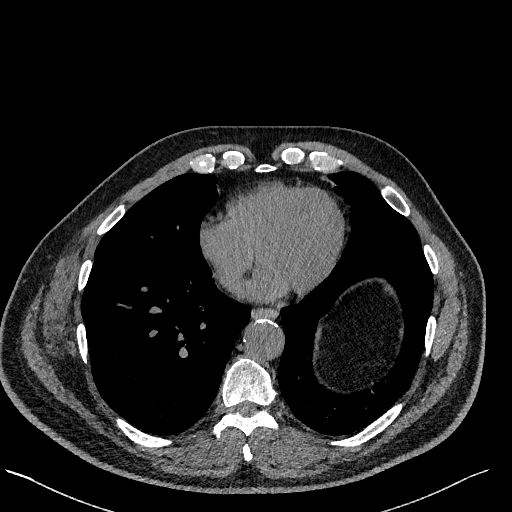
[im 164/444  lung]
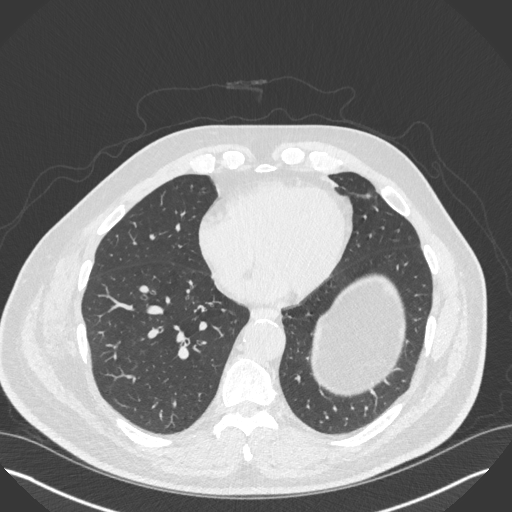
[im 210/444  lung]
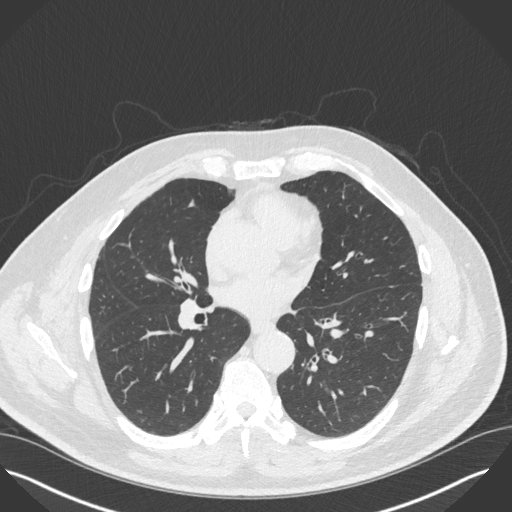
[im 234/444  lung]
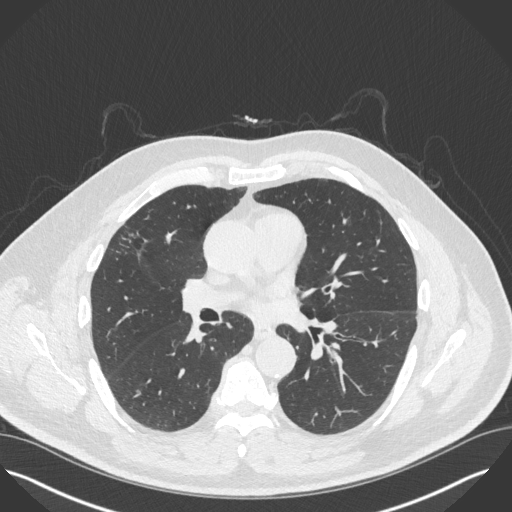
[im 280/444  lung]
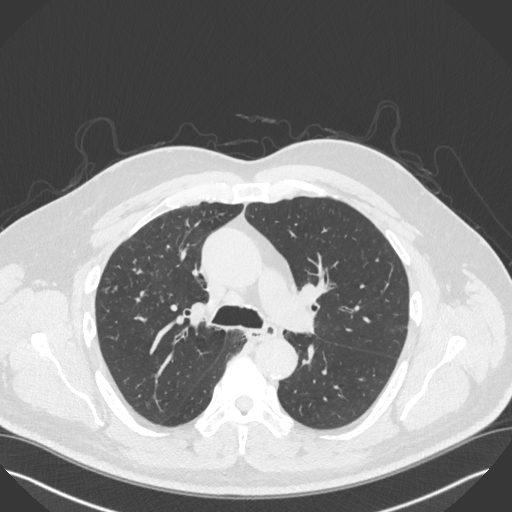
[im 304/444  mediastinal]
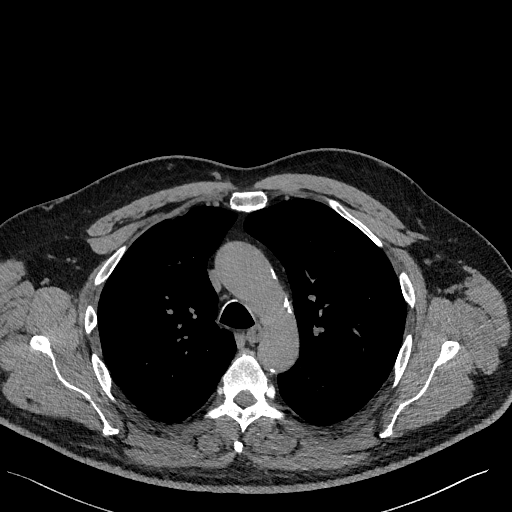
[im 304/444  lung]
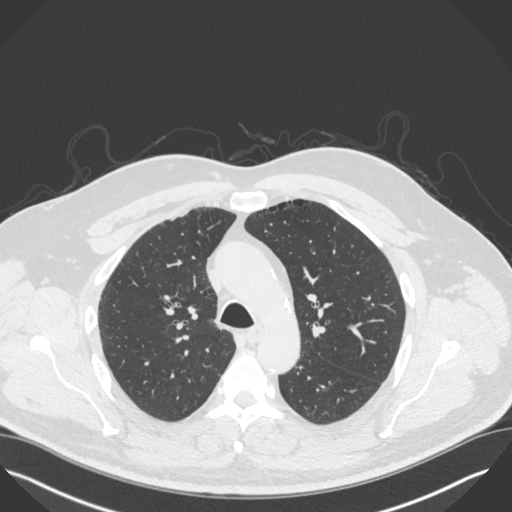
[im 350/444  lung]
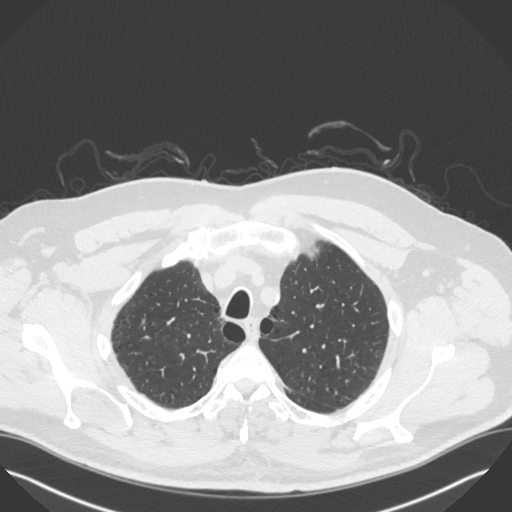
[im 374/444  lung]
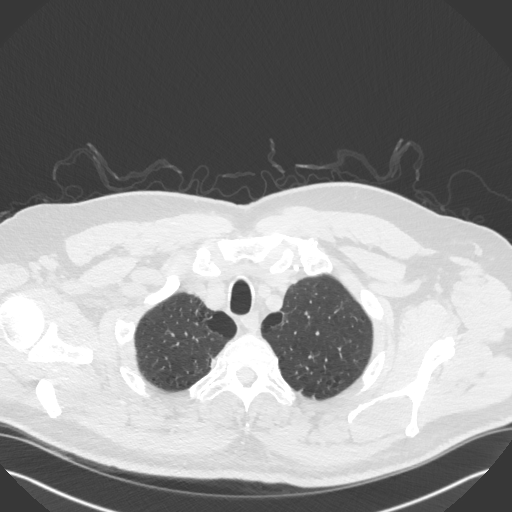
[im 420/444  lung]
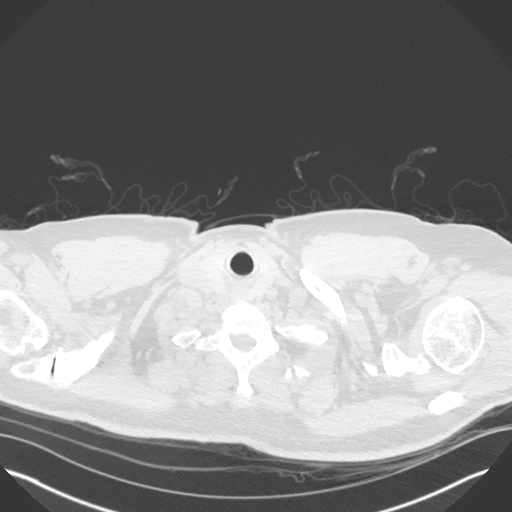

[Series 5: coronal · coronal · 0.69mm/px · 3 of 88 slices shown]
[im 18/88  lung]
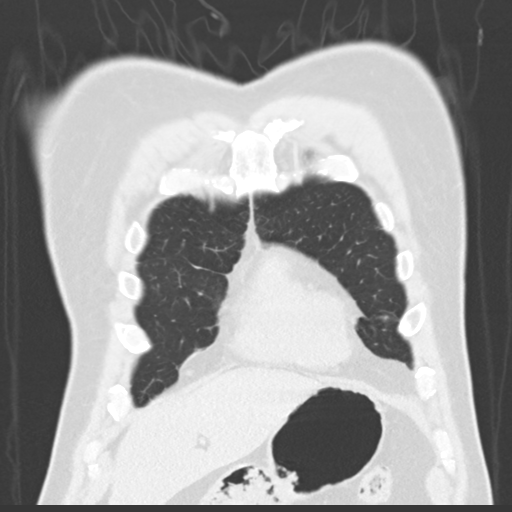
[im 35/88  lung]
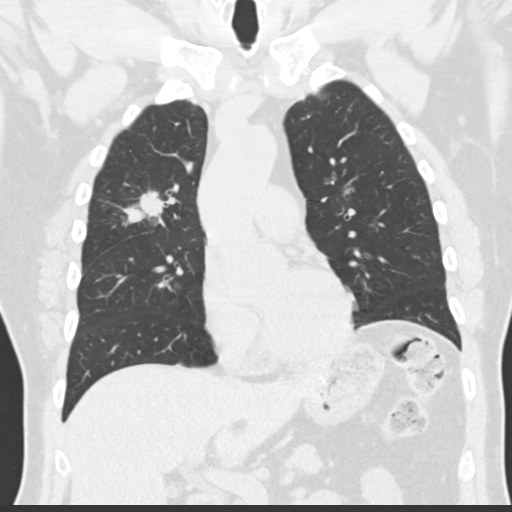
[im 53/88  lung]
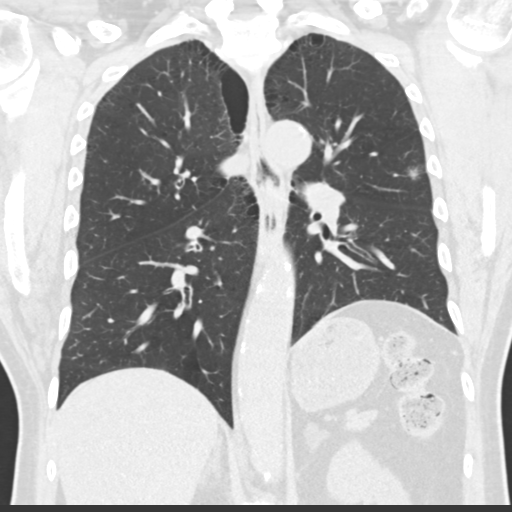

[15 of 36 positions shown; findings below may reference images not displayed]

FINDINGS: Cardiovascular: No acute findings. Aortic and coronary artery
atherosclerosis. Stable 4.4 cm ascending thoracic aortic aneurysm.

Mediastinum/Nodes: No masses or pathologically enlarged lymph nodes
identified on this unenhanced exam.

Lungs/Pleura: Spiculated mass is again seen in the right upper lobe
measuring 3.1 cm on image 77/3. Spiculated nodule in the posterior
right upper lobe measures 1.3 cm on image 47/3. Spiculated nodule in
the anterior left upper lobe measures 2.0 cm on image 73/3.
Spiculated nodule in the left upper lobe measures 9 mm on image
64/3. 3 mm subpleural pulmonary nodule again seen in the left lower
lobe on image 108/3. All these pulmonary nodules and masses are
stable compared to previous study. No evidence of pleural effusion.
Mild emphysema noted.

Upper Abdomen: Stable diffuse thickening and low-attenuation of both
thyroid glands, consistent with adrenal hyperplasia.

Musculoskeletal:  No suspicious bone lesions.
IMPRESSION: Bilateral upper lobe spiculated pulmonary nodules, including
dominant 3.1 cm mass in the right upper lobe, highly suspicious for
synchronous bronchogenic carcinomas and/or metastases. 3 mm
subpleural left lower lobe pulmonary nodule is stable and too small
to characterize.

No evidence of lymphadenopathy or pleural effusion.

Stable 4.4 cm ascending thoracic aortic aneurysm. Recommend annual
imaging followup by CTA or MRA. This recommendation follows 0575
ACCF/AHA/AATS/ACR/ASA/SCA/KURZ/GGV/LATANYA/AUJLA Guidelines for the
Diagnosis and Management of Patients with Thoracic Aortic Disease.
Circulation. 0575; 121: [id number]

## 2018-10-26 ENCOUNTER — Ambulatory Visit: Payer: Medicare Other

## 2018-10-26 ENCOUNTER — Other Ambulatory Visit: Payer: Medicare Other

## 2018-10-26 ENCOUNTER — Other Ambulatory Visit: Payer: Self-pay | Admitting: Oncology

## 2018-10-26 ENCOUNTER — Telehealth: Payer: Self-pay | Admitting: Medical Oncology

## 2018-10-26 ENCOUNTER — Ambulatory Visit: Payer: Medicare Other | Admitting: Internal Medicine

## 2018-10-26 DIAGNOSIS — C7800 Secondary malignant neoplasm of unspecified lung: Secondary | ICD-10-CM

## 2018-10-27 ENCOUNTER — Telehealth: Payer: Self-pay | Admitting: Internal Medicine

## 2018-10-27 ENCOUNTER — Other Ambulatory Visit: Payer: Self-pay | Admitting: *Deleted

## 2018-10-27 DIAGNOSIS — C7800 Secondary malignant neoplasm of unspecified lung: Secondary | ICD-10-CM

## 2018-10-27 MED ORDER — MIRTAZAPINE 30 MG PO TABS: 30.0000 mg | ORAL_TABLET | Freq: Every day | ORAL | 1 refills | Status: DC

## 2018-10-27 NOTE — Telephone Encounter (Signed)
Returned call to patient re moving time of 1/16 appointments to a little later. Spoke with patient re new time for 1/16 appointments to 9:30 am.

## 2018-10-30 ENCOUNTER — Other Ambulatory Visit: Payer: Self-pay | Admitting: Internal Medicine

## 2018-10-31 ENCOUNTER — Telehealth: Payer: Self-pay | Admitting: *Deleted

## 2018-10-31 ENCOUNTER — Other Ambulatory Visit: Payer: Self-pay | Admitting: Oncology

## 2018-10-31 DIAGNOSIS — C7800 Secondary malignant neoplasm of unspecified lung: Secondary | ICD-10-CM

## 2018-10-31 IMAGING — CR DG CHEST 1V PORT
1 series · 1 of 1 positions shown · non-contrast
Comparison: None.

CLINICAL DATA: Postop bronchoscopy.

EXAM:
PORTABLE CHEST 1 VIEW

[AP]
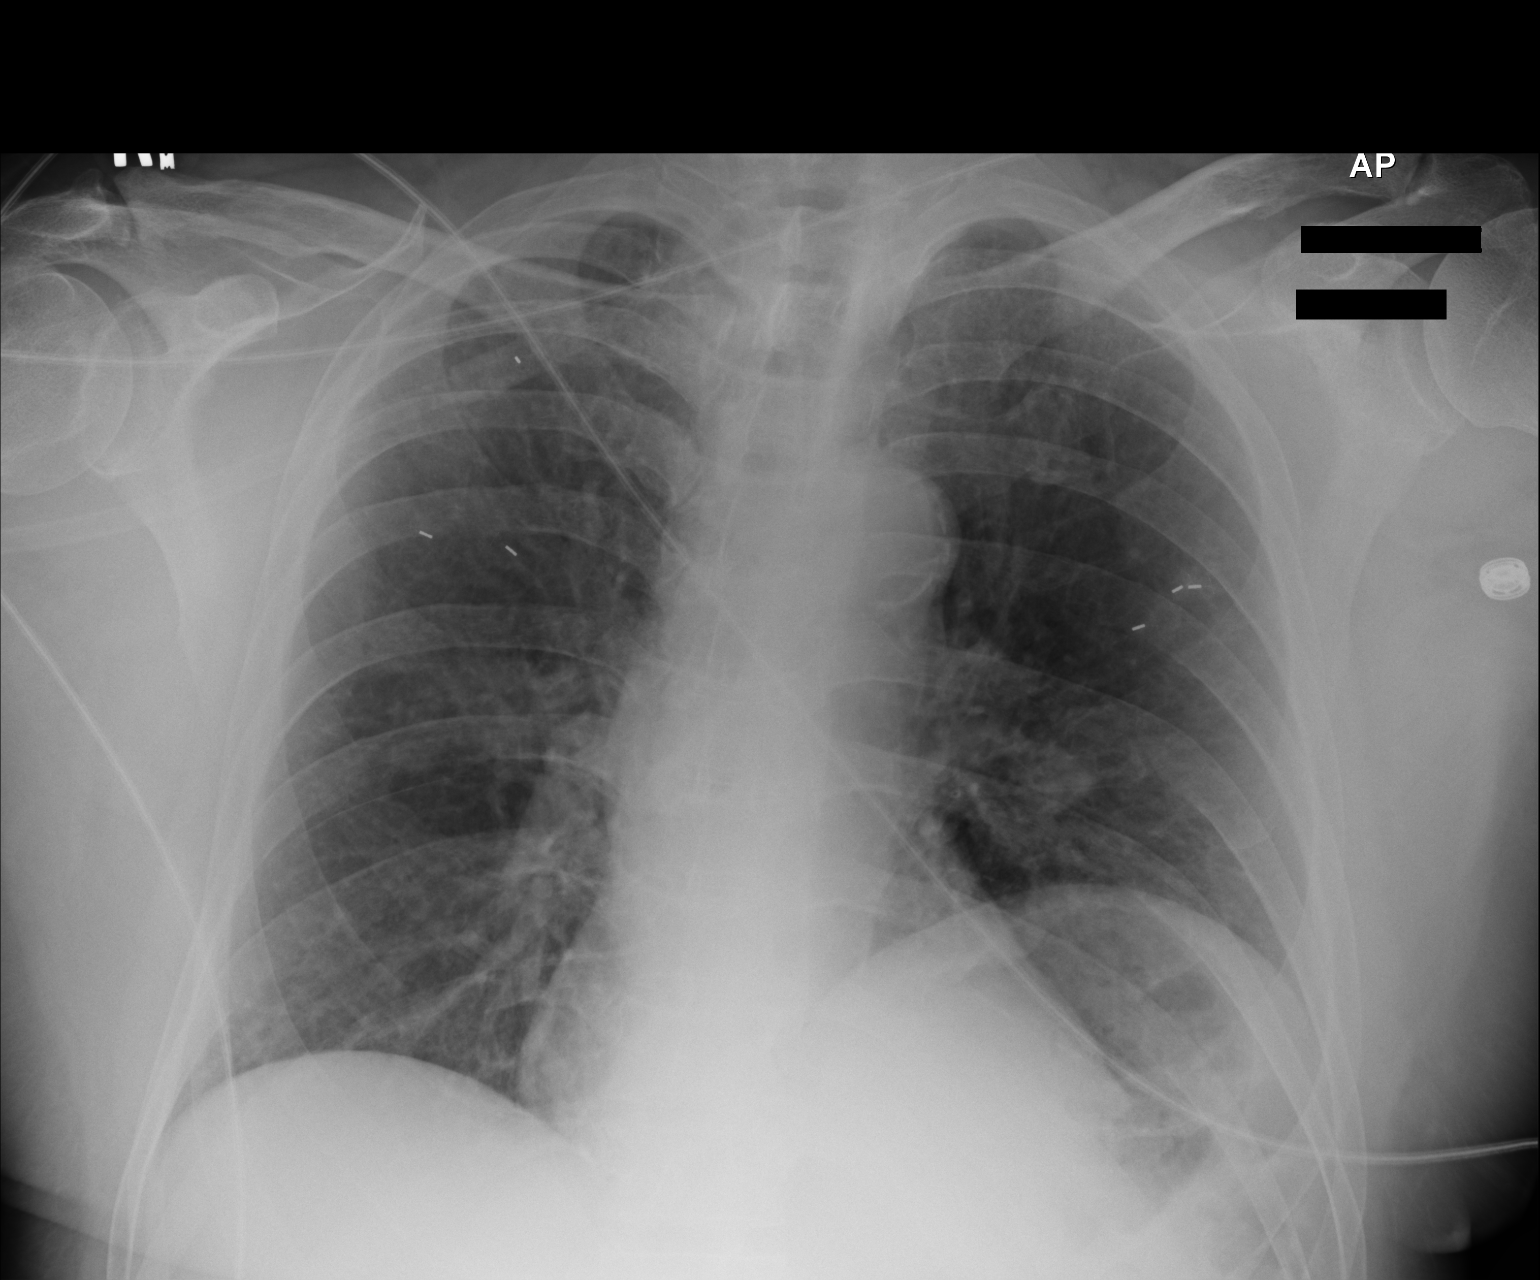

[1 of 1 positions shown; findings below may reference images not displayed]

FINDINGS: The heart size and mediastinal contours are within normal limits.
Atherosclerosis of thoracic aorta is noted. No pneumothorax or
pleural effusion is noted. Elevated left hemidiaphragm is noted.
Both lungs are clear. The visualized skeletal structures are
unremarkable.
IMPRESSION: Aortic atherosclerosis. Elevated left hemidiaphragm. No pneumothorax
seen.

## 2018-10-31 NOTE — Telephone Encounter (Signed)
Received call from patient requesting refill on his Temazepam. Informed pt that the request is 2 weeks early and cannot be refilled yet. He also asked for his Remeron. It appeared that this was refilled on 10/27/18 but when speaking to his pharmacist-they did not receive the refill request. Therefore his Remeron was refilled.

## 2018-11-01 ENCOUNTER — Other Ambulatory Visit: Payer: Self-pay | Admitting: *Deleted

## 2018-11-01 DIAGNOSIS — C7800 Secondary malignant neoplasm of unspecified lung: Secondary | ICD-10-CM

## 2018-11-02 ENCOUNTER — Inpatient Hospital Stay: Payer: Medicare Other

## 2018-11-02 ENCOUNTER — Inpatient Hospital Stay: Payer: Medicare Other | Attending: Internal Medicine

## 2018-11-02 ENCOUNTER — Inpatient Hospital Stay (HOSPITAL_BASED_OUTPATIENT_CLINIC_OR_DEPARTMENT_OTHER): Payer: Medicare Other | Admitting: Medical

## 2018-11-02 VITALS — BP 124/77 | HR 78 | Temp 97.7°F | Resp 18 | Ht 73.0 in | Wt 203.5 lb

## 2018-11-02 DIAGNOSIS — C3411 Malignant neoplasm of upper lobe, right bronchus or lung: Secondary | ICD-10-CM | POA: Diagnosis not present

## 2018-11-02 DIAGNOSIS — C3412 Malignant neoplasm of upper lobe, left bronchus or lung: Secondary | ICD-10-CM | POA: Diagnosis not present

## 2018-11-02 DIAGNOSIS — C7800 Secondary malignant neoplasm of unspecified lung: Secondary | ICD-10-CM

## 2018-11-02 DIAGNOSIS — Z5112 Encounter for antineoplastic immunotherapy: Secondary | ICD-10-CM | POA: Diagnosis not present

## 2018-11-02 LAB — CBC WITH DIFFERENTIAL (CANCER CENTER ONLY)
Abs Immature Granulocytes: 0.08 10*3/uL — ABNORMAL HIGH (ref 0.00–0.07)
Basophils Absolute: 0.1 10*3/uL (ref 0.0–0.1)
Basophils Relative: 1 %
Eosinophils Absolute: 0.2 10*3/uL (ref 0.0–0.5)
Eosinophils Relative: 1 %
HCT: 44.9 % (ref 39.0–52.0)
Hemoglobin: 14.5 g/dL (ref 13.0–17.0)
Immature Granulocytes: 1 %
Lymphocytes Relative: 25 %
Lymphs Abs: 3.1 10*3/uL (ref 0.7–4.0)
MCH: 28.3 pg (ref 26.0–34.0)
MCHC: 32.3 g/dL (ref 30.0–36.0)
MCV: 87.7 fL (ref 80.0–100.0)
Monocytes Absolute: 0.9 10*3/uL (ref 0.1–1.0)
Monocytes Relative: 7 %
NEUTROS ABS: 8.3 10*3/uL — AB (ref 1.7–7.7)
Neutrophils Relative %: 65 %
Platelet Count: 496 10*3/uL — ABNORMAL HIGH (ref 150–400)
RBC: 5.12 MIL/uL (ref 4.22–5.81)
RDW: 13.5 % (ref 11.5–15.5)
WBC Count: 12.6 10*3/uL — ABNORMAL HIGH (ref 4.0–10.5)
nRBC: 0 % (ref 0.0–0.2)

## 2018-11-02 LAB — CMP (CANCER CENTER ONLY)
ALK PHOS: 82 U/L (ref 38–126)
ALT: 24 U/L (ref 0–44)
ANION GAP: 10 (ref 5–15)
AST: 14 U/L — ABNORMAL LOW (ref 15–41)
Albumin: 3.6 g/dL (ref 3.5–5.0)
BUN: 14 mg/dL (ref 8–23)
CO2: 25 mmol/L (ref 22–32)
Calcium: 9.5 mg/dL (ref 8.9–10.3)
Chloride: 100 mmol/L (ref 98–111)
Creatinine: 1.02 mg/dL (ref 0.61–1.24)
GFR, Est AFR Am: >60 mL/min (ref >60)
GFR, Estimated: >60 mL/min (ref >60)
Glucose, Bld: 109 mg/dL — ABNORMAL HIGH (ref 70–99)
POTASSIUM: 4.8 mmol/L (ref 3.5–5.1)
Sodium: 135 mmol/L (ref 135–145)
Total Bilirubin: 0.3 mg/dL (ref 0.3–1.2)
Total Protein: 8.5 g/dL — ABNORMAL HIGH (ref 6.5–8.1)

## 2018-11-02 MED ORDER — SODIUM CHLORIDE 0.9 % IV SOLN
480.0000 mg | Freq: Once | INTRAVENOUS | Status: AC
  Administered 2018-11-02: 480 mg via INTRAVENOUS
  Filled 2018-11-02: qty 48

## 2018-11-02 MED ORDER — SODIUM CHLORIDE 0.9 % IV SOLN
Freq: Once | INTRAVENOUS | Status: AC
  Administered 2018-11-02: 11:00:00 via INTRAVENOUS
  Filled 2018-11-02: qty 250

## 2018-11-02 NOTE — Progress Notes (Signed)
OK to treat today per PA Lucianne Lei & MD Mohamed, CBC & CMP reviewed.

## 2018-11-02 NOTE — Patient Instructions (Signed)
[facility] Discharge Instructions for Patients Receiving Chemotherapy  Today you received the following immunotherapy agents:  Opdivo  To help prevent nausea and vomiting after your treatment, we encourage you to take your nausea medication as prescribed.   If you develop nausea and vomiting that is not controlled by your nausea medication, call the clinic.   BELOW ARE SYMPTOMS THAT SHOULD BE REPORTED IMMEDIATELY:  *FEVER GREATER THAN 100.5 F  *CHILLS WITH OR WITHOUT FEVER  NAUSEA AND VOMITING THAT IS NOT CONTROLLED WITH YOUR NAUSEA MEDICATION  *UNUSUAL SHORTNESS OF BREATH  *UNUSUAL BRUISING OR BLEEDING  TENDERNESS IN MOUTH AND THROAT WITH OR WITHOUT PRESENCE OF ULCERS  *URINARY PROBLEMS  *BOWEL PROBLEMS  UNUSUAL RASH Items with * indicate a potential emergency and should be followed up as soon as possible.  Feel free to call the clinic should you have any questions or concerns. The clinic phone number is [phone].  Please show the [facility] at check-in to the Emergency Department and triage nurse.

## 2018-11-02 NOTE — Progress Notes (Signed)
[facility] Clinic Progress Note   [name] [name] [id number] [birth year] 68 y.o.  [name] [name] is managed by Dr. Eilleen Kempf  Actively treated with chemotherapy/immunotherapy/hormonal therapy: yes  Current Therapy: Nivolumab  Last Treated: 10/05/18 (cycle 11 day 1)  Assessment: Plan:    Metastatic squamous cell carcinoma to lung, unspecified laterality ([place]) - Plan: CT Abdomen Pelvis W Contrast, CT Chest W Contrast   1) Stage IV NSCLC: The patient will receive cycle 12 day 1 of Nivolumab today.  We will proceed with his treatment today.  He will return on 11/30/2018 for consideration of his next cycle of chemotherapy with restaging scans prior to his return.   Please see After Visit Summary for patient specific instructions.  Future Appointments  Date Time Provider [place]  11/30/2018 11:45 AM [facility] LAB 4 [facility] None  11/30/2018 12:15 PM [name], MD [facility] None  11/30/2018  1:15 PM [facility] INFUSION [facility] None  12/28/2018  9:15 AM [facility] LAB 3 [facility] None  12/28/2018  9:45 AM [name], MD [facility] None  12/28/2018 10:30 AM [facility] INFUSION [facility] None  01/12/2019 10:00 AM [facility] PFT ROOM [facility] None  01/12/2019 11:15 AM Mannam, Praveen, MD [facility] None  03/09/2019  1:00 PM [facility] VASC 4 - [facility]  03/09/2019  1:45 PM Nickel, Sharmon Leyden, NP [facility]  04/05/2019 10:15 AM [name], MD [facility] None    Orders Placed This Encounter  Procedures  . CT Abdomen Pelvis W Contrast  . CT Chest W Contrast       Subjective:   Patient ID:  [name] [name] is a 68 y.o. (DOB 10-Aug-1951) male.  Chief Complaint:  Chief Complaint  Patient presents with  . Follow-up    HPI OTHMAR RINGER is a 68 y.o. male with a stage IV non small cell lung cancer who is followed by Dr. Julien Nordmann.  He presents for consideration of cycle 12 day 1 of Nivolumab.  He reports that is he doing well  overall with no issues of concern.  He was seen by Dr. Mickeal Skinner on 10/13/18 for drug-induced poly-neuropathy.  Medications: I have reviewed the patient's current medications.  Allergies: No Known Allergies  Past Medical History:  Diagnosis Date  . Depression 01/29/2017  . Dyspnea   . Encounter for antineoplastic chemotherapy 01/29/2017  . Goals of care, counseling/discussion 01/29/2017  . Hypertension   . Metastatic squamous cell carcinoma to lung, unspecified laterality ([place]) 01/28/2017  . Stroke [facility])     Past Surgical History:  Procedure Laterality Date  . BACK SURGERY     cyst removal  . COLONOSCOPY    . LAPAROTOMY N/A 02/25/2017   Procedure: EXPLORATORY LAPAROTOMY MODIFIED GRAHAM'S PATCH;  Surgeon: Kinsinger, Arta Bruce, MD;  Location: [facility];  Service: General;  Laterality: N/A;  . MINOR PLACEMENT OF FIDUCIAL N/A 01/19/2017   Procedure: MINOR PLACEMENT OF FIDUCIAL x 6;  Surgeon: Collene Gobble, MD;  Location: [facility];  Service: Thoracic;  Laterality: N/A;  . VIDEO BRONCHOSCOPY WITH ENDOBRONCHIAL NAVIGATION N/A 01/19/2017   Procedure: VIDEO BRONCHOSCOPY WITH ENDOBRONCHIAL NAVIGATION;  Surgeon: Collene Gobble, MD;  [facility];  Service: Thoracic;  Laterality: N/A;    Family History  Problem Relation Age of Onset  . Cancer Father        unsure of what type    Social History   Socioeconomic History  . Marital status: Single    Spouse name: Not on file  .  Number of children: 0  . Years of education: 10  . Highest education level: Not on file  Occupational History    Comment: NA  Social Needs  . Financial resource strain: Not on file  . Food insecurity:    Worry: Not on file    Inability: Not on file  . Transportation needs:    Medical: No    Non-medical: No  Tobacco Use  . Smoking status: Current Every Day Smoker    Packs/day: 1.00    Years: 45.00    Pack years: 45.00    Types: Cigarettes  . Smokeless tobacco: Never Used  . Tobacco comment: Less than 1 pk per day   Substance and Sexual Activity  . Alcohol use: No  . Drug use: Yes    Types: Marijuana    Comment: daily  . Sexual activity: Not Currently  Lifestyle  . Physical activity:    Days per week: Not on file    Minutes per session: Not on file  . Stress: Not on file  Relationships  . Social connections:    Talks on phone: Not on file    Gets together: Not on file    Attends religious service: Not on file    Active member of club or organization: Not on file    Attends meetings of clubs or organizations: Not on file    Relationship status: Not on file  . Intimate partner violence:    Fear of current or ex partner: No    Emotionally abused: No    Physically abused: No    Forced sexual activity: No  Other Topics Concern  . Not on file  Social History Narrative   Lives alone   Caffeine- coffee, 5 cups daily    Past Medical History, Surgical history, Social history, and Family history were reviewed and updated as appropriate.   Please see review of systems for further details on the patient's review from today.   Review of Systems:  Review of Systems  Constitutional: Negative for chills, diaphoresis and fever.  HENT: Negative for trouble swallowing and voice change.   Respiratory: Negative for cough, chest tightness, shortness of breath and wheezing.   Cardiovascular: Negative for chest pain and palpitations.  Gastrointestinal: Negative for abdominal pain, constipation, diarrhea, nausea and vomiting.  Musculoskeletal: Negative for back pain and myalgias.  Neurological: Positive for numbness (peripheral neuropathy). Negative for dizziness, light-headedness and headaches.    Objective:   Physical Exam:  BP 124/77 (BP Location: Right Arm, Patient Position: Sitting)   Pulse 78   Temp 97.7 F (36.5 C) (Oral)   Resp 18   Ht 6\' 1"  (1.854 m)   Wt 203 lb 8 oz (92.3 kg)   SpO2 97%   BMI 26.85 kg/m  ECOG: 0  Physical Exam Constitutional:      General: He is not in acute  distress.    Appearance: He is not diaphoretic.  HENT:     Head: Normocephalic and atraumatic.  Cardiovascular:     Rate and Rhythm: Normal rate and regular rhythm.     Heart sounds: Normal heart sounds. No murmur. No friction rub. No gallop.   Pulmonary:     Effort: Pulmonary effort is normal. No respiratory distress.     Breath sounds: Normal breath sounds. No wheezing or rales.  Skin:    General: Skin is warm and dry.     Findings: No erythema or rash.  Neurological:     Mental Status: He  is alert.     Gait: Gait normal.  Psychiatric:        Mood and Affect: Mood normal.        Behavior: Behavior normal.        Thought Content: Thought content normal.        Judgment: Judgment normal.     Lab Review:     Component Value Date/Time   NA 135 11/02/2018 0914   NA 139 10/06/2017 1052   K 4.8 11/02/2018 0914   K 4.2 10/06/2017 1052   CL 100 11/02/2018 0914   CO2 25 11/02/2018 0914   CO2 25 10/06/2017 1052   GLUCOSE 109 (H) 11/02/2018 0914   GLUCOSE 107 10/06/2017 1052   BUN 14 11/02/2018 0914   BUN 13.2 10/06/2017 1052   CREATININE 1.02 11/02/2018 0914   CREATININE 0.9 10/06/2017 1052   CALCIUM 9.5 11/02/2018 0914   CALCIUM 9.5 10/06/2017 1052   PROT 8.5 (H) 11/02/2018 0914   PROT 7.4 10/06/2017 1052   ALBUMIN 3.6 11/02/2018 0914   ALBUMIN 4.3 10/06/2017 1052   AST 14 (L) 11/02/2018 0914   AST 14 10/06/2017 1052   ALT 24 11/02/2018 0914   ALT 13 10/06/2017 1052   ALKPHOS 82 11/02/2018 0914   ALKPHOS 68 10/06/2017 1052   BILITOT 0.3 11/02/2018 0914   BILITOT 0.44 10/06/2017 1052   GFRNONAA >60 11/02/2018 0914   GFRAA >60 11/02/2018 0914       Component Value Date/Time   WBC 12.6 (H) 11/02/2018 0914   WBC 8.9 10/06/2017 1052   WBC 9.7 03/02/2017 0403   RBC 5.12 11/02/2018 0914   HGB 14.5 11/02/2018 0914   HGB 14.9 10/06/2017 1052   HCT 44.9 11/02/2018 0914   HCT 44.7 10/06/2017 1052   PLT 496 (H) 11/02/2018 0914   PLT 224 10/06/2017 1052   MCV 87.7  11/02/2018 0914   MCV 94.5 10/06/2017 1052   MCH 28.3 11/02/2018 0914   MCHC 32.3 11/02/2018 0914   RDW 13.5 11/02/2018 0914   RDW 12.6 10/06/2017 1052   LYMPHSABS 3.1 11/02/2018 0914   LYMPHSABS 4.8 (H) 10/06/2017 1052   MONOABS 0.9 11/02/2018 0914   MONOABS 0.7 10/06/2017 1052   EOSABS 0.2 11/02/2018 0914   EOSABS 0.3 10/06/2017 1052   BASOSABS 0.1 11/02/2018 0914   BASOSABS 0.1 10/06/2017 1052   -------------------------------  Imaging from last 24 hours (if applicable):  Radiology interpretation: No results found.      This case was discussed with Dr. Julien Nordmann. He expressed agreement with my management of this patient.

## 2018-11-03 ENCOUNTER — Other Ambulatory Visit: Payer: Self-pay | Admitting: Medical

## 2018-11-03 DIAGNOSIS — C7800 Secondary malignant neoplasm of unspecified lung: Secondary | ICD-10-CM

## 2018-11-03 DIAGNOSIS — C3490 Malignant neoplasm of unspecified part of unspecified bronchus or lung: Secondary | ICD-10-CM

## 2018-11-03 NOTE — Telephone Encounter (Signed)
10/26/18-called to pharmacy=-LVM

## 2018-11-06 ENCOUNTER — Telehealth: Payer: Self-pay | Admitting: Medical Oncology

## 2018-11-06 NOTE — Telephone Encounter (Addendum)
2/13-has labs appt and infusion then he has CT at 4. Per Ernesta Amble message to [facility] scheduling for Ct to be in am.

## 2018-11-15 ENCOUNTER — Other Ambulatory Visit: Payer: Self-pay | Admitting: Internal Medicine

## 2018-11-15 ENCOUNTER — Other Ambulatory Visit: Payer: Self-pay | Admitting: *Deleted

## 2018-11-15 DIAGNOSIS — G4700 Insomnia, unspecified: Secondary | ICD-10-CM

## 2018-11-15 DIAGNOSIS — C7800 Secondary malignant neoplasm of unspecified lung: Secondary | ICD-10-CM

## 2018-11-15 NOTE — Telephone Encounter (Signed)
Rx refilled and sent to pharmacy by MD

## 2018-11-16 ENCOUNTER — Other Ambulatory Visit: Payer: Self-pay | Admitting: *Deleted

## 2018-11-16 DIAGNOSIS — C7931 Secondary malignant neoplasm of brain: Secondary | ICD-10-CM

## 2018-11-16 DIAGNOSIS — Z5111 Encounter for antineoplastic chemotherapy: Secondary | ICD-10-CM

## 2018-11-16 DIAGNOSIS — C7800 Secondary malignant neoplasm of unspecified lung: Secondary | ICD-10-CM

## 2018-11-16 DIAGNOSIS — R197 Diarrhea, unspecified: Secondary | ICD-10-CM | POA: Diagnosis not present

## 2018-11-16 MED ORDER — GABAPENTIN 300 MG PO CAPS: ORAL_CAPSULE | ORAL | 2 refills | Status: DC

## 2018-11-23 ENCOUNTER — Ambulatory Visit: Payer: Medicare Other

## 2018-11-23 ENCOUNTER — Other Ambulatory Visit: Payer: Medicare Other

## 2018-11-23 ENCOUNTER — Ambulatory Visit: Payer: Medicare Other | Admitting: Oncology

## 2018-11-23 ENCOUNTER — Telehealth: Payer: Self-pay | Admitting: Medical Oncology

## 2018-11-23 NOTE — Telephone Encounter (Signed)
"  is it okay for me to have physical relationships with the drug I am taking for my cancer?" I told him yes it is okay.

## 2018-11-24 NOTE — Telephone Encounter (Signed)
But with strict contraception precautions for any male partners in the reproductive age.

## 2018-11-24 NOTE — Telephone Encounter (Signed)
TCT patient and advised him that physical intimacy was fine while on his immunotherapy but if the male is of child bearing age, contraceptive precautions need to be taken.  Pt voiced understanding. Pt also requesting a change to to the date of his CT Scan. Currently it is scheduled for the same day as his next infusion. Provide # for pt to call central scheduling. Pt repeated back the correct # and he stated he would call them to re-schedule.  No other questions or concerns.

## 2018-11-29 ENCOUNTER — Other Ambulatory Visit: Payer: Self-pay | Admitting: Medical Oncology

## 2018-11-29 DIAGNOSIS — C7800 Secondary malignant neoplasm of unspecified lung: Secondary | ICD-10-CM

## 2018-11-29 MED ORDER — MIRTAZAPINE 30 MG PO TABS: 30.0000 mg | ORAL_TABLET | Freq: Every day | ORAL | 0 refills | Status: DC

## 2018-11-29 NOTE — Telephone Encounter (Signed)
mirtazepine refilled.

## 2018-11-30 ENCOUNTER — Inpatient Hospital Stay (HOSPITAL_BASED_OUTPATIENT_CLINIC_OR_DEPARTMENT_OTHER): Payer: Medicare Other | Admitting: Internal Medicine

## 2018-11-30 ENCOUNTER — Inpatient Hospital Stay: Payer: Medicare Other | Attending: Internal Medicine

## 2018-11-30 ENCOUNTER — Other Ambulatory Visit: Payer: Self-pay | Admitting: Internal Medicine

## 2018-11-30 ENCOUNTER — Telehealth: Payer: Self-pay | Admitting: Internal Medicine

## 2018-11-30 ENCOUNTER — Telehealth: Payer: Self-pay | Admitting: *Deleted

## 2018-11-30 ENCOUNTER — Encounter: Payer: Self-pay | Admitting: Internal Medicine

## 2018-11-30 ENCOUNTER — Ambulatory Visit (HOSPITAL_COMMUNITY)
Admission: RE | Admit: 2018-11-30 | Discharge: 2018-11-30 | Disposition: A | Discharge status: Home / Self Care | Payer: Medicare Other | Source: Ambulatory Visit | Attending: Medical | Admitting: Medical

## 2018-11-30 ENCOUNTER — Inpatient Hospital Stay: Payer: Medicare Other

## 2018-11-30 VITALS — BP 126/86 | HR 84 | Temp 98.2°F | Resp 18 | Ht 73.0 in | Wt 208.4 lb

## 2018-11-30 DIAGNOSIS — C7800 Secondary malignant neoplasm of unspecified lung: Secondary | ICD-10-CM

## 2018-11-30 DIAGNOSIS — C3490 Malignant neoplasm of unspecified part of unspecified bronchus or lung: Secondary | ICD-10-CM | POA: Insufficient documentation

## 2018-11-30 DIAGNOSIS — C7931 Secondary malignant neoplasm of brain: Secondary | ICD-10-CM

## 2018-11-30 DIAGNOSIS — C3411 Malignant neoplasm of upper lobe, right bronchus or lung: Secondary | ICD-10-CM | POA: Diagnosis not present

## 2018-11-30 DIAGNOSIS — Z5112 Encounter for antineoplastic immunotherapy: Secondary | ICD-10-CM | POA: Insufficient documentation

## 2018-11-30 DIAGNOSIS — C3412 Malignant neoplasm of upper lobe, left bronchus or lung: Secondary | ICD-10-CM

## 2018-11-30 DIAGNOSIS — G629 Polyneuropathy, unspecified: Secondary | ICD-10-CM | POA: Diagnosis not present

## 2018-11-30 DIAGNOSIS — K5730 Diverticulosis of large intestine without perforation or abscess without bleeding: Secondary | ICD-10-CM | POA: Diagnosis not present

## 2018-11-30 LAB — CBC WITH DIFFERENTIAL (CANCER CENTER ONLY)
Abs Immature Granulocytes: 0.05 10*3/uL (ref 0.00–0.07)
BASOS PCT: 1 %
Basophils Absolute: 0.1 10*3/uL (ref 0.0–0.1)
Eosinophils Absolute: 0.2 10*3/uL (ref 0.0–0.5)
Eosinophils Relative: 2 %
HCT: 45 % (ref 39.0–52.0)
Hemoglobin: 14.5 g/dL (ref 13.0–17.0)
Immature Granulocytes: 0 %
Lymphocytes Relative: 33 %
Lymphs Abs: 3.7 10*3/uL (ref 0.7–4.0)
MCH: 28.4 pg (ref 26.0–34.0)
MCHC: 32.2 g/dL (ref 30.0–36.0)
MCV: 88.2 fL (ref 80.0–100.0)
Monocytes Absolute: 1 10*3/uL (ref 0.1–1.0)
Monocytes Relative: 9 %
Neutro Abs: 6.1 10*3/uL (ref 1.7–7.7)
Neutrophils Relative %: 55 %
Platelet Count: 297 10*3/uL (ref 150–400)
RBC: 5.1 MIL/uL (ref 4.22–5.81)
RDW: 15 % (ref 11.5–15.5)
WBC Count: 11.2 10*3/uL — ABNORMAL HIGH (ref 4.0–10.5)
nRBC: 0 % (ref 0.0–0.2)

## 2018-11-30 LAB — CMP (CANCER CENTER ONLY)
ALT: 13 U/L (ref 0–44)
AST: 13 U/L — ABNORMAL LOW (ref 15–41)
Albumin: 4 g/dL (ref 3.5–5.0)
Alkaline Phosphatase: 82 U/L (ref 38–126)
Anion gap: 8 (ref 5–15)
BUN: 9 mg/dL (ref 8–23)
CO2: 27 mmol/L (ref 22–32)
Calcium: 9.4 mg/dL (ref 8.9–10.3)
Chloride: 104 mmol/L (ref 98–111)
Creatinine: 1.04 mg/dL (ref 0.61–1.24)
GFR, Estimated: >60 mL/min (ref >60)
Glucose, Bld: 95 mg/dL (ref 70–99)
Potassium: 4.2 mmol/L (ref 3.5–5.1)
Sodium: 139 mmol/L (ref 135–145)
TOTAL PROTEIN: 7.9 g/dL (ref 6.5–8.1)
Total Bilirubin: 0.4 mg/dL (ref 0.3–1.2)

## 2018-11-30 MED ORDER — SODIUM CHLORIDE 0.9 % IV SOLN
480.0000 mg | Freq: Once | INTRAVENOUS | Status: AC
  Administered 2018-11-30: 480 mg via INTRAVENOUS
  Filled 2018-11-30: qty 48

## 2018-11-30 MED ORDER — SODIUM CHLORIDE (PF) 0.9 % IJ SOLN
INTRAMUSCULAR | Status: AC
  Filled 2018-11-30: qty 50

## 2018-11-30 MED ORDER — SODIUM CHLORIDE 0.9 % IV SOLN
Freq: Once | INTRAVENOUS | Status: AC
  Administered 2018-11-30: 14:00:00 via INTRAVENOUS
  Filled 2018-11-30: qty 250

## 2018-11-30 MED ORDER — IOHEXOL 300 MG/ML  SOLN
100.0000 mL | Freq: Once | INTRAMUSCULAR | Status: AC | PRN
  Administered 2018-11-30: 100 mL via INTRAVENOUS

## 2018-11-30 NOTE — Telephone Encounter (Signed)
Scheduled appt per 2/13 los - pt to get an updated schedule next visit.

## 2018-11-30 NOTE — Progress Notes (Signed)
[facility] [phone]   [phone]  OFFICE PROGRESS NOTE  System, Pcp Not In No address on file  DIAGNOSIS: Stage IV (T3, N0, M1 a) non-small cell lung cancer, squamous cell carcinoma presented with multiple bilateral pulmonary nodules diagnosed in April 2018.  PRIOR THERAPY:  Systemic chemotherapy with carboplatin for AUC of 5 and paclitaxel 175 MG/M2 every 3 weeks with Neulasta support. Status post 6 cycles.  CURRENT THERAPY: Second line treatment with immunotherapy was Nivolumab 480 mg IV every 4 weeks.  First cycle November 14, 2017.  Status post 12 cycles.  INTERVAL HISTORY: [name] [name] 68 y.o. male returns to the clinic today for follow-up visit.  The patient is feeling fine today with no concerning complaints.  He returned back to work and expected to start driving again in few weeks.  He got permission to drive from his neuro oncologist Dr. Mickeal Skinner.  The patient denied having any chest pain, shortness of breath, cough or hemoptysis.  He continues to have mild peripheral neuropathy.  He denied having any nausea, vomiting, diarrhea or constipation.  He has no headache or visual changes.  He is here today for evaluation before starting cycle #13 of his treatment.  He is scheduled to have repeat CT scan of the chest, abdomen and pelvis later today.  MEDICAL HISTORY: Past Medical History:  Diagnosis Date  . Depression 01/29/2017  . Dyspnea   . Encounter for antineoplastic chemotherapy 01/29/2017  . Goals of care, counseling/discussion 01/29/2017  . Hypertension   . Metastatic squamous cell carcinoma to lung, unspecified laterality ([place]) 01/28/2017  . Stroke [facility])     ALLERGIES:  has No Known Allergies.  MEDICATIONS:  Current Outpatient Medications  Medication Sig Dispense Refill  . acetaminophen (TYLENOL) 325 MG tablet You can take 2 every 4 hours as needed.  You can buy it over the counter DO NOT TAKE MORE THAN 4000 MG OF TYLENOL PER DAY.   IT CAN HARM YOUR LIVER.    Marland Kitchen amitriptyline (ELAVIL) 100 MG tablet TAKE 1 TABLET BY MOUTH AT BEDTIME 30 tablet 0  . aspirin EC 81 MG tablet Take 81 mg by mouth daily.    Marland Kitchen gabapentin (NEURONTIN) 300 MG capsule TAKE 2 CAPSULES BY MOUTH 3 TIMES DAILY 90 capsule 2  . hydrochlorothiazide (HYDRODIURIL) 25 MG tablet Restart this after you see your Primary care doctor.  Check your blood pressure at home and record.    Marland Kitchen lisinopril (PRINIVIL,ZESTRIL) 40 MG tablet Take 40 mg by mouth daily.    . metoprolol succinate (TOPROL-XL) 50 MG 24 hr tablet 50 mg.  0  . mirtazapine (REMERON) 30 MG tablet Take 1 tablet (30 mg total) by mouth at bedtime. 30 tablet 0  . pantoprazole (PROTONIX) 40 MG tablet Take 1 tablet (40 mg total) by mouth 2 (two) times daily. 60 tablet 1  . ranitidine (ZANTAC) 150 MG tablet Take 150 mg by mouth 2 (two) times daily.    . simvastatin (ZOCOR) 5 MG tablet Take 1 tablet (5 mg total) by mouth daily. 5 tablet 3  . temazepam (RESTORIL) 30 MG capsule TAKE 1 CAPSULE BY MOUTH AT BEDTIME AS NEEDED FOR SLEEP 30 capsule 0   No current facility-administered medications for this visit.     SURGICAL HISTORY:  Past Surgical History:  Procedure Laterality Date  . BACK SURGERY     cyst removal  . COLONOSCOPY    . LAPAROTOMY N/A 02/25/2017   Procedure: EXPLORATORY  LAPAROTOMY MODIFIED GRAHAM'S PATCH;  Surgeon: Kinsinger, Arta Bruce, MD;  Location: [facility];  Service: General;  Laterality: N/A;  . MINOR PLACEMENT OF FIDUCIAL N/A 01/19/2017   Procedure: MINOR PLACEMENT OF FIDUCIAL x 6;  Surgeon: Collene Gobble, MD;  Location: [facility];  Service: Thoracic;  Laterality: N/A;  . VIDEO BRONCHOSCOPY WITH ENDOBRONCHIAL NAVIGATION N/A 01/19/2017   Procedure: VIDEO BRONCHOSCOPY WITH ENDOBRONCHIAL NAVIGATION;  Surgeon: Collene Gobble, MD;  Location: [facility];  Service: Thoracic;  Laterality: N/A;    REVIEW OF SYSTEMS:  A comprehensive review of systems was negative except for: Neurological: positive for paresthesia    PHYSICAL EXAMINATION: General appearance: alert, cooperative and no distress Head: Normocephalic, without obvious abnormality, atraumatic Neck: no adenopathy, no JVD, supple, symmetrical, trachea midline and thyroid not enlarged, symmetric, no tenderness/mass/nodules Lymph nodes: Cervical, supraclavicular, and axillary nodes normal. Resp: clear to auscultation bilaterally Back: symmetric, no curvature. ROM normal. No CVA tenderness. Cardio: regular rate and rhythm, S1, S2 normal, no murmur, click, rub or gallop GI: soft, non-tender; bowel sounds normal; no masses,  no organomegaly Extremities: extremities normal, atraumatic, no cyanosis or edema  ECOG PERFORMANCE STATUS: 1 - Symptomatic but completely ambulatory   Blood pressure 126/86, pulse 84, temperature 98.2 F (36.8 C), temperature source Oral, resp. rate 18, height 6\' 1"  (1.854 m), weight 208 lb 6.4 oz (94.5 kg), SpO2 99 %.  LABORATORY DATA: Lab Results  Component Value Date   WBC 11.2 (H) 11/30/2018   HGB 14.5 11/30/2018   HCT 45.0 11/30/2018   MCV 88.2 11/30/2018   PLT 297 11/30/2018      Chemistry      Component Value Date/Time   NA 135 11/02/2018 0914   NA 139 10/06/2017 1052   K 4.8 11/02/2018 0914   K 4.2 10/06/2017 1052   CL 100 11/02/2018 0914   CO2 25 11/02/2018 0914   CO2 25 10/06/2017 1052   BUN 14 11/02/2018 0914   BUN 13.2 10/06/2017 1052   CREATININE 1.02 11/02/2018 0914   CREATININE 0.9 10/06/2017 1052      Component Value Date/Time   CALCIUM 9.5 11/02/2018 0914   CALCIUM 9.5 10/06/2017 1052   ALKPHOS 82 11/02/2018 0914   ALKPHOS 68 10/06/2017 1052   AST 14 (L) 11/02/2018 0914   AST 14 10/06/2017 1052   ALT 24 11/02/2018 0914   ALT 13 10/06/2017 1052   BILITOT 0.3 11/02/2018 0914   BILITOT 0.44 10/06/2017 1052       RADIOGRAPHIC STUDIES: No results found.  ASSESSMENT AND PLAN:  This is a very pleasant 68 years old white male with stage IV non-small cell lung cancer, squamous cell  carcinoma presented with multiple bilateral pulmonary nodules. The patient was treated with systemic chemotherapy with carboplatin and paclitaxel status post 6 cycles. He tolerated the last cycle of his treatment well except for the persistent peripheral neuropathy. The patient has been in observation for the last 3 months. He had a repeat CT scan of the chest, abdomen and pelvis performed recently.  Has a scan showed mild interval enlargement of left upper lobe as well as right upper lobe pulmonary nodules concerning for disease progression. The patient was started on treatment with immunotherapy with Nivolumab 480 mg IV every 4 weeks status post 12 cycles.   He has been tolerating this treatment well with no concerning adverse effects. I recommended for him to proceed with cycle #13 today as scheduled. The patient will have repeat CT scan of the chest,  abdomen and pelvis later today for restaging of his disease. For the peripheral neuropathy he will continue his current treatment with Neurontin 600 mg p.o. 3 times daily. I will see him back for follow-up visit in 4 weeks for evaluation before the next cycle of his treatment. He was advised to call immediately if he has any concerning symptoms in the interval. The patient voices understanding of current disease status and treatment options and is in agreement with the current care plan. All questions were answered. The patient knows to call the clinic with any problems, questions or concerns. We can certainly see the patient much sooner if necessary.  Disclaimer: This note was dictated with voice recognition software. Similar sounding words can inadvertently be transcribed and may not be corrected upon review.

## 2018-11-30 NOTE — Telephone Encounter (Signed)
Called pt who advised he is enroute, running late and ETA 1pm. Will review with MD. Durward Fortes pt he needs to check in and at least get labs as he has a CT scan at 4pm.

## 2018-11-30 NOTE — Patient Instructions (Signed)
[facility] Discharge Instructions for Patients Receiving Chemotherapy  Today you received the following immunotherapy agents:  Opdivo  To help prevent nausea and vomiting after your treatment, we encourage you to take your nausea medication as prescribed.   If you develop nausea and vomiting that is not controlled by your nausea medication, call the clinic.   BELOW ARE SYMPTOMS THAT SHOULD BE REPORTED IMMEDIATELY:  *FEVER GREATER THAN 100.5 F  *CHILLS WITH OR WITHOUT FEVER  NAUSEA AND VOMITING THAT IS NOT CONTROLLED WITH YOUR NAUSEA MEDICATION  *UNUSUAL SHORTNESS OF BREATH  *UNUSUAL BRUISING OR BLEEDING  TENDERNESS IN MOUTH AND THROAT WITH OR WITHOUT PRESENCE OF ULCERS  *URINARY PROBLEMS  *BOWEL PROBLEMS  UNUSUAL RASH Items with * indicate a potential emergency and should be followed up as soon as possible.  Feel free to call the clinic should you have any questions or concerns. The clinic phone number is [phone].  Please show the [facility] at check-in to the Emergency Department and triage nurse.

## 2018-12-01 NOTE — Progress Notes (Signed)
These preliminary result these preliminary results were noted.  Awaiting final report.

## 2018-12-06 ENCOUNTER — Telehealth: Payer: Self-pay | Admitting: Medical Oncology

## 2018-12-06 ENCOUNTER — Telehealth: Payer: Self-pay | Admitting: Internal Medicine

## 2018-12-06 NOTE — Telephone Encounter (Signed)
R/s appt per 2/19 sch message - spoke with patient and patient is aware of the reschedule

## 2018-12-06 NOTE — Telephone Encounter (Signed)
Appt conflict- due to 2 hour drive he cannot be here until 1100- schedule message sent.

## 2018-12-07 ENCOUNTER — Other Ambulatory Visit: Payer: Self-pay | Admitting: *Deleted

## 2018-12-07 DIAGNOSIS — Z5111 Encounter for antineoplastic chemotherapy: Secondary | ICD-10-CM

## 2018-12-07 DIAGNOSIS — C7800 Secondary malignant neoplasm of unspecified lung: Secondary | ICD-10-CM

## 2018-12-07 DIAGNOSIS — C7931 Secondary malignant neoplasm of brain: Secondary | ICD-10-CM

## 2018-12-07 MED ORDER — GABAPENTIN 300 MG PO CAPS: ORAL_CAPSULE | ORAL | 2 refills | Status: DC

## 2018-12-07 MED ORDER — GABAPENTIN 300 MG PO CAPS: 300.0000 mg | ORAL_CAPSULE | Freq: Three times a day (TID) | ORAL | 2 refills | Status: DC

## 2018-12-08 ENCOUNTER — Ambulatory Visit: Payer: Medicare Other | Admitting: Internal Medicine

## 2018-12-13 ENCOUNTER — Telehealth: Payer: Self-pay | Admitting: *Deleted

## 2018-12-13 ENCOUNTER — Other Ambulatory Visit: Payer: Self-pay | Admitting: Internal Medicine

## 2018-12-13 DIAGNOSIS — G4700 Insomnia, unspecified: Secondary | ICD-10-CM

## 2018-12-13 DIAGNOSIS — C7800 Secondary malignant neoplasm of unspecified lung: Secondary | ICD-10-CM

## 2018-12-13 NOTE — Telephone Encounter (Signed)
Rtn call to pt advised MD refilled Rx for Restoril.

## 2018-12-25 ENCOUNTER — Telehealth: Payer: Self-pay | Admitting: Medical Oncology

## 2018-12-25 NOTE — Telephone Encounter (Signed)
Cancelled appts for wed. Request r/s thursday or Friday . Schedule message sent.

## 2018-12-26 ENCOUNTER — Telehealth: Payer: Self-pay | Admitting: Internal Medicine

## 2018-12-26 NOTE — Telephone Encounter (Signed)
R/s appt per 3/09 sch message - left message for patient with appt date and time

## 2018-12-27 ENCOUNTER — Inpatient Hospital Stay: Payer: Medicare Other | Admitting: Internal Medicine

## 2018-12-27 ENCOUNTER — Inpatient Hospital Stay: Payer: Medicare Other

## 2018-12-28 ENCOUNTER — Ambulatory Visit: Payer: Medicare Other

## 2018-12-28 ENCOUNTER — Other Ambulatory Visit: Payer: Medicare Other

## 2018-12-28 ENCOUNTER — Ambulatory Visit: Payer: Medicare Other | Admitting: Internal Medicine

## 2018-12-28 ENCOUNTER — Other Ambulatory Visit: Payer: Self-pay | Admitting: *Deleted

## 2018-12-28 DIAGNOSIS — Z5112 Encounter for antineoplastic immunotherapy: Secondary | ICD-10-CM

## 2018-12-28 DIAGNOSIS — C7800 Secondary malignant neoplasm of unspecified lung: Secondary | ICD-10-CM

## 2018-12-29 ENCOUNTER — Telehealth: Payer: Self-pay | Admitting: *Deleted

## 2018-12-29 ENCOUNTER — Inpatient Hospital Stay: Payer: Medicare Other

## 2018-12-29 ENCOUNTER — Inpatient Hospital Stay: Payer: Medicare Other | Attending: Internal Medicine

## 2018-12-29 ENCOUNTER — Inpatient Hospital Stay: Payer: Medicare Other | Admitting: Internal Medicine

## 2018-12-29 DIAGNOSIS — G620 Drug-induced polyneuropathy: Secondary | ICD-10-CM | POA: Insufficient documentation

## 2018-12-29 DIAGNOSIS — C3411 Malignant neoplasm of upper lobe, right bronchus or lung: Secondary | ICD-10-CM | POA: Insufficient documentation

## 2018-12-29 DIAGNOSIS — C3412 Malignant neoplasm of upper lobe, left bronchus or lung: Secondary | ICD-10-CM | POA: Insufficient documentation

## 2018-12-29 DIAGNOSIS — Z5112 Encounter for antineoplastic immunotherapy: Secondary | ICD-10-CM | POA: Insufficient documentation

## 2018-12-29 DIAGNOSIS — Z79899 Other long term (current) drug therapy: Secondary | ICD-10-CM | POA: Insufficient documentation

## 2018-12-29 NOTE — Progress Notes (Deleted)
Tried calling pt, no voicemail, pt did not arrive to lab appt 12/29/2018 at 0800. Pt appts cancelled. Tammi desk RN for Dr Mickeal Skinner notified as well as Immunologist for Dr Julien Nordmann.

## 2018-12-29 NOTE — Telephone Encounter (Signed)
TCT patient this afternoon as he missed several appts today including his treatment appt. Unable to reach him earlier.  Spoke with him, he stated he thought his appts were last week. Advised pt to keep copy of his appt schedule at all times. Advised that I sent scheduling request to have his appts re-scheduled. Pt voiced understanding.

## 2019-01-01 ENCOUNTER — Other Ambulatory Visit: Payer: Self-pay | Admitting: Medical Oncology

## 2019-01-01 DIAGNOSIS — C7800 Secondary malignant neoplasm of unspecified lung: Secondary | ICD-10-CM

## 2019-01-01 MED ORDER — MIRTAZAPINE 30 MG PO TABS: 30.0000 mg | ORAL_TABLET | Freq: Every day | ORAL | 1 refills | Status: DC

## 2019-01-02 ENCOUNTER — Telehealth: Payer: Self-pay | Admitting: Internal Medicine

## 2019-01-02 ENCOUNTER — Telehealth: Payer: Self-pay | Admitting: *Deleted

## 2019-01-02 NOTE — Telephone Encounter (Signed)
R/s appt per 3/13 sch message - left message for patient with appt date and time

## 2019-01-02 NOTE — Telephone Encounter (Signed)
Received TC from patient stating that someone called about his appts here. Reviewed his upcoming appts. He is scheduled for labs,. Dr. Julien Nordmann and chemo. He states he cannot make it tomorrow-he is working and will be in [place].  He wants to reschedule to Friday.  Please advise

## 2019-01-03 ENCOUNTER — Inpatient Hospital Stay: Payer: Medicare Other

## 2019-01-03 ENCOUNTER — Inpatient Hospital Stay: Payer: Medicare Other | Admitting: Internal Medicine

## 2019-01-03 ENCOUNTER — Telehealth: Payer: Self-pay | Admitting: Internal Medicine

## 2019-01-03 ENCOUNTER — Ambulatory Visit: Payer: Medicare Other

## 2019-01-03 NOTE — Telephone Encounter (Signed)
R/s appt per 3/17 sch message - left message for patient with appt date and time

## 2019-01-04 ENCOUNTER — Telehealth: Payer: Self-pay | Admitting: Medical Oncology

## 2019-01-04 NOTE — Telephone Encounter (Signed)
appt confirmed for tomorrow.

## 2019-01-05 ENCOUNTER — Inpatient Hospital Stay: Payer: Medicare Other

## 2019-01-05 ENCOUNTER — Inpatient Hospital Stay (HOSPITAL_BASED_OUTPATIENT_CLINIC_OR_DEPARTMENT_OTHER): Payer: Medicare Other | Admitting: Physician Assistant

## 2019-01-05 ENCOUNTER — Other Ambulatory Visit: Payer: Self-pay | Admitting: *Deleted

## 2019-01-05 ENCOUNTER — Telehealth: Payer: Self-pay | Admitting: Internal Medicine

## 2019-01-05 ENCOUNTER — Encounter: Payer: Self-pay | Admitting: Physician Assistant

## 2019-01-05 ENCOUNTER — Other Ambulatory Visit: Payer: Self-pay

## 2019-01-05 ENCOUNTER — Inpatient Hospital Stay (HOSPITAL_BASED_OUTPATIENT_CLINIC_OR_DEPARTMENT_OTHER): Payer: Medicare Other | Admitting: Internal Medicine

## 2019-01-05 VITALS — BP 142/85 | HR 65 | Temp 97.6°F | Resp 20 | Ht 73.0 in | Wt 204.0 lb

## 2019-01-05 DIAGNOSIS — G620 Drug-induced polyneuropathy: Secondary | ICD-10-CM | POA: Diagnosis not present

## 2019-01-05 DIAGNOSIS — C3412 Malignant neoplasm of upper lobe, left bronchus or lung: Secondary | ICD-10-CM | POA: Diagnosis not present

## 2019-01-05 DIAGNOSIS — C7800 Secondary malignant neoplasm of unspecified lung: Secondary | ICD-10-CM

## 2019-01-05 DIAGNOSIS — C3411 Malignant neoplasm of upper lobe, right bronchus or lung: Secondary | ICD-10-CM

## 2019-01-05 DIAGNOSIS — R5383 Other fatigue: Secondary | ICD-10-CM

## 2019-01-05 DIAGNOSIS — T451X5A Adverse effect of antineoplastic and immunosuppressive drugs, initial encounter: Secondary | ICD-10-CM

## 2019-01-05 DIAGNOSIS — Z5112 Encounter for antineoplastic immunotherapy: Secondary | ICD-10-CM

## 2019-01-05 DIAGNOSIS — Z5111 Encounter for antineoplastic chemotherapy: Secondary | ICD-10-CM

## 2019-01-05 DIAGNOSIS — C7931 Secondary malignant neoplasm of brain: Secondary | ICD-10-CM

## 2019-01-05 DIAGNOSIS — Z79899 Other long term (current) drug therapy: Secondary | ICD-10-CM | POA: Diagnosis not present

## 2019-01-05 LAB — CBC WITH DIFFERENTIAL (CANCER CENTER ONLY)
Abs Immature Granulocytes: 0.03 10*3/uL (ref 0.00–0.07)
Basophils Absolute: 0.1 10*3/uL (ref 0.0–0.1)
Basophils Relative: 1 %
Eosinophils Absolute: 0.2 10*3/uL (ref 0.0–0.5)
Eosinophils Relative: 3 %
HCT: 44.2 % (ref 39.0–52.0)
Hemoglobin: 14.1 g/dL (ref 13.0–17.0)
Immature Granulocytes: 0 %
Lymphocytes Relative: 39 %
Lymphs Abs: 3.5 10*3/uL (ref 0.7–4.0)
MCH: 29.1 pg (ref 26.0–34.0)
MCHC: 31.9 g/dL (ref 30.0–36.0)
MCV: 91.1 fL (ref 80.0–100.0)
Monocytes Absolute: 0.8 10*3/uL (ref 0.1–1.0)
Monocytes Relative: 9 %
Neutro Abs: 4.4 10*3/uL (ref 1.7–7.7)
Neutrophils Relative %: 48 %
Platelet Count: 247 10*3/uL (ref 150–400)
RBC: 4.85 MIL/uL (ref 4.22–5.81)
RDW: 15.3 % (ref 11.5–15.5)
WBC Count: 9 10*3/uL (ref 4.0–10.5)
nRBC: 0 % (ref 0.0–0.2)

## 2019-01-05 LAB — CMP (CANCER CENTER ONLY)
ALT: 11 U/L (ref 0–44)
AST: 11 U/L — AB (ref 15–41)
Albumin: 3.8 g/dL (ref 3.5–5.0)
Alkaline Phosphatase: 69 U/L (ref 38–126)
Anion gap: 10 (ref 5–15)
BUN: 14 mg/dL (ref 8–23)
CO2: 24 mmol/L (ref 22–32)
Calcium: 8.6 mg/dL — ABNORMAL LOW (ref 8.9–10.3)
Chloride: 105 mmol/L (ref 98–111)
Creatinine: 1 mg/dL (ref 0.61–1.24)
GFR, Est AFR Am: >60 mL/min (ref >60)
GFR, Estimated: >60 mL/min (ref >60)
Glucose, Bld: 87 mg/dL (ref 70–99)
POTASSIUM: 4.3 mmol/L (ref 3.5–5.1)
Sodium: 139 mmol/L (ref 135–145)
Total Bilirubin: 0.4 mg/dL (ref 0.3–1.2)
Total Protein: 7.4 g/dL (ref 6.5–8.1)

## 2019-01-05 LAB — TSH: TSH: 2.671 u[IU]/mL (ref 0.320–4.118)

## 2019-01-05 MED ORDER — AMITRIPTYLINE HCL 100 MG PO TABS: 100.0000 mg | ORAL_TABLET | Freq: Every day | ORAL | 3 refills | Status: DC

## 2019-01-05 MED ORDER — SODIUM CHLORIDE 0.9 % IV SOLN
480.0000 mg | Freq: Once | INTRAVENOUS | Status: AC
  Administered 2019-01-05: 480 mg via INTRAVENOUS
  Filled 2019-01-05: qty 48

## 2019-01-05 MED ORDER — GABAPENTIN 300 MG PO CAPS: ORAL_CAPSULE | ORAL | 3 refills | Status: DC

## 2019-01-05 MED ORDER — SODIUM CHLORIDE 0.9 % IV SOLN
Freq: Once | INTRAVENOUS | Status: AC
  Administered 2019-01-05: 12:00:00 via INTRAVENOUS
  Filled 2019-01-05: qty 250
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
## 2019-01-05 NOTE — Telephone Encounter (Signed)
No los °

## 2019-01-05 NOTE — Progress Notes (Signed)
[facility] OFFICE PROGRESS NOTE  System, Pcp Not In No address on file  DIAGNOSIS: Stage IV (T3, N0, M1 a) non-small cell lung cancer, squamous cell carcinoma presented with multiple bilateral pulmonary nodules diagnosed in April 2018.  PRIOR THERAPY: Systemic chemotherapy with carboplatin for AUC of 5 and paclitaxel 175 MG/M2 every 3 weeks with Neulasta support. Status post 6 cycles.  CURRENT THERAPY: Second line treatment with immunotherapy was Nivolumab 480 mg IV every 4 weeks.  First cycle November 14, 2017.  Status post 13 cycles.  INTERVAL HISTORY: [name] [name] 68 y.o. male returns to the clinic for a follow-up visit.  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 He states he has not had any trouble performing his work related duties since returning to work. Overall the patient is feeling well today without any concerning complaints. He denies any fever, chills, night sweats, or weight loss.  He denies any nausea, vomiting, or constipation.  He does endorse some intermittent diarrhea over the last 3-4 months. He states he has approximately 2 loose stools a day without any associated symptoms such as fever, abdominal pain, or melena, mucus, or hematochezia. He is unable to associate his diarrhea symptoms with any particular trigger. He denies any chest pain, shortness of breath, wheezing, or hemoptysis. He reports baseline productive cough.  He denies any rashes or skin changes.  He denies any headache or visual changes.  He recently had a restaging CT scan and is here for evaluation and to discuss the scan results.  MEDICAL HISTORY: Past Medical History:  Diagnosis Date  . Depression 01/29/2017  . Dyspnea   . Encounter for antineoplastic chemotherapy 01/29/2017  . Goals of care, counseling/discussion 01/29/2017  . Hypertension   . Metastatic squamous cell carcinoma to lung, unspecified laterality ([place])  01/28/2017  . Stroke [facility])     ALLERGIES:  has No Known Allergies.  MEDICATIONS:  Current Outpatient Medications  Medication Sig Dispense Refill  . acetaminophen (TYLENOL) 325 MG tablet You can take 2 every 4 hours as needed.  You can buy it over the counter DO NOT TAKE MORE THAN 4000 MG OF TYLENOL PER DAY.  IT CAN HARM YOUR LIVER.    Marland Kitchen aspirin EC 81 MG tablet Take 81 mg by mouth daily.    Marland Kitchen DIFICID 200 MG TABS tablet     . gabapentin (NEURONTIN) 300 MG capsule Take 1 capsule (300 mg total) by mouth 3 (three) times daily. 2 capsules (600 mg total) by mouth 3 times daily 90 capsule 2  . hydrochlorothiazide (HYDRODIURIL) 25 MG tablet Restart this after you see your Primary care doctor.  Check your blood pressure at home and record.    Marland Kitchen lisinopril (PRINIVIL,ZESTRIL) 40 MG tablet Take 40 mg by mouth daily.    . metoprolol succinate (TOPROL-XL) 50 MG 24 hr tablet 50 mg.  0  . mirtazapine (REMERON) 30 MG tablet Take 1 tablet (30 mg total) by mouth at bedtime. 30 tablet 1  . nortriptyline (PAMELOR) 10 MG capsule TAKE 1 CAPSULE BY MOUTH TWICE DAILY(1 WITH EVENING MEAL AND 1 AT BEDTIME) TO SLOW OVERACTIVE BOWEL MUSCLES FOR 30 DAYS    . pantoprazole (PROTONIX) 40 MG tablet Take 1 tablet (40 mg total) by mouth 2 (two) times daily. 60 tablet 1  . ranitidine (ZANTAC) 150 MG tablet Take 150 mg by mouth 2 (two) times daily.    . simvastatin (  ZOCOR) 20 MG tablet Take 20 mg by mouth daily.    . simvastatin (ZOCOR) 5 MG tablet Take 1 tablet (5 mg total) by mouth daily. 5 tablet 3  . temazepam (RESTORIL) 30 MG capsule TAKE 1 CAPSULE BY MOUTH ONCE DAILY AT BEDTIME AS NEEDED FOR SLEEP 30 capsule 0  . amitriptyline (ELAVIL) 100 MG tablet Take 1 tablet (100 mg total) by mouth at bedtime. 30 tablet 3  . gabapentin (NEURONTIN) 300 MG capsule TAKE 2 CAPSULES BY MOUTH 3 TIMES DAILY 90 capsule 3   No current facility-administered medications for this visit.    Facility-Administered Medications Ordered in Other  Visits  Medication Dose Route Frequency Provider Last Rate Last Dose  . nivolumab (OPDIVO) 480 mg in sodium chloride 0.9 % 100 mL chemo infusion  480 mg Intravenous Once [name], MD        SURGICAL HISTORY:  Past Surgical History:  Procedure Laterality Date  . BACK SURGERY     cyst removal  . COLONOSCOPY    . LAPAROTOMY N/A 02/25/2017   Procedure: EXPLORATORY LAPAROTOMY MODIFIED GRAHAM'S PATCH;  Surgeon: Kinsinger, Arta Bruce, MD;  Location[facility];  Service: General;  Laterality: N/A;  . MINOR PLACEMENT OF FIDUCIAL N/A 01/19/2017   Procedure: MINOR PLACEMENT OF FIDUCIAL x 6;  Surgeon: Collene Gobble, MD;  Location: [facility];  Service: Thoracic;  Laterality: N/A;  . VIDEO BRONCHOSCOPY WITH ENDOBRONCHIAL NAVIGATION N/A 01/19/2017   Procedure: VIDEO BRONCHOSCOPY WITH ENDOBRONCHIAL NAVIGATION;  Surgeon: Collene Gobble, MD;  Location: [facility];  Service: Thoracic;  Laterality: N/A;    REVIEW OF SYSTEMS:   Review of Systems  Constitutional: Negative for appetite change, chills, fatigue, fever and unexpected weight change.  HENT:   Negative for mouth sores, nosebleeds, sore throat and trouble swallowing.   Eyes: Negative for eye problems and icterus.  Respiratory: Negative for cough, hemoptysis, shortness of breath and wheezing.   Cardiovascular: Negative for chest pain and leg swelling.  Gastrointestinal: Positive for diarrhea. Negative for abdominal pain, constipation,  nausea and vomiting.  Genitourinary: Negative for bladder incontinence, difficulty urinating, dysuria, frequency and hematuria.   Musculoskeletal: Negative for back pain, gait problem, neck pain and neck stiffness.  Skin: Negative for itching and rash.  Neurological: Negative for dizziness, extremity weakness, gait problem, headaches, light-headedness and seizures.  Hematological: Negative for adenopathy. Does not bruise/bleed easily.  Psychiatric/Behavioral: Negative for confusion, depression and sleep disturbance. The  patient is not nervous/anxious.     PHYSICAL EXAMINATION:  Blood pressure (!) 142/85, pulse 65, temperature 97.6 F (36.4 C), temperature source Oral, resp. rate 20, height 6\' 1"  (1.854 m), weight 204 lb (92.5 kg), SpO2 98 %.  ECOG PERFORMANCE STATUS: 1 - Symptomatic but completely ambulatory  Physical Exam  Constitutional: Oriented to person, place, and time and well-developed, well-nourished, and in no distress. No distress.  HENT:  Head: Normocephalic and atraumatic.  Mouth/Throat: Oropharynx is clear and moist. No oropharyngeal exudate.  Eyes: Conjunctivae are normal. Right eye exhibits no discharge. Left eye exhibits no discharge. No scleral icterus.  Neck: Normal range of motion. Neck supple.  Cardiovascular: Normal rate, regular rhythm, normal heart sounds and intact distal pulses.   Pulmonary/Chest: Effort normal and breath sounds normal. No respiratory distress. No wheezes. No rales.  Abdominal: Soft. Bowel sounds are normal. Exhibits no distension and no mass. There is no tenderness.  Musculoskeletal: Normal range of motion. Exhibits no edema.  Lymphadenopathy:    No cervical adenopathy.  Neurological: Alert and  oriented to person, place, and time. Exhibits normal muscle tone. Gait normal. Coordination normal.  Skin: Skin is warm and dry. No rash noted. Not diaphoretic. No erythema. No pallor.  Psychiatric: Mood, memory and judgment normal.  Vitals reviewed.  LABORATORY DATA: Lab Results  Component Value Date   WBC 9.0 01/05/2019   HGB 14.1 01/05/2019   HCT 44.2 01/05/2019   MCV 91.1 01/05/2019   PLT 247 01/05/2019      Chemistry      Component Value Date/Time   NA 139 01/05/2019 1008   NA 139 10/06/2017 1052   K 4.3 01/05/2019 1008   K 4.2 10/06/2017 1052   CL 105 01/05/2019 1008   CO2 24 01/05/2019 1008   CO2 25 10/06/2017 1052   BUN 14 01/05/2019 1008   BUN 13.2 10/06/2017 1052   CREATININE 1.00 01/05/2019 1008   CREATININE 0.9 10/06/2017 1052       Component Value Date/Time   CALCIUM 8.6 (L) 01/05/2019 1008   CALCIUM 9.5 10/06/2017 1052   ALKPHOS 69 01/05/2019 1008   ALKPHOS 68 10/06/2017 1052   AST 11 (L) 01/05/2019 1008   AST 14 10/06/2017 1052   ALT 11 01/05/2019 1008   ALT 13 10/06/2017 1052   BILITOT 0.4 01/05/2019 1008   BILITOT 0.44 10/06/2017 1052       RADIOGRAPHIC STUDIES:  No results found.   ASSESSMENT/PLAN:  This is a very pleasant 68 year old Caucasian male with stage IV non-small cell lung cancer, squamous cell carcinoma.  He presented with multiple bilateral pulmonary nodules.  He was diagnosed in April 2018. He underwent systemic chemotherapy with carboplatin and paclitaxel.  He is status post 6 cycles.  He tolerated it well except for the development of persistent peripheral neuropathy.  He was then on observation for 3 months. He showed signs of disease progression.   He is currently being treated with immunotherapy with nivolumab 480 mg IV every 4 weeks.  He is status post 13 cycles. The patient was seen with Dr. Julien Nordmann today.  He recently had a restaging CT scan performed.  Dr. Julien Nordmann personally and independently reviewed the scan and discussed the results with the patient.  The scan showed stable disease with the exception of a single area of a slight increasing density.  It is unclear if this represents inflammation vs. mild tumor growth.   Dr. Julien Nordmann recommended proceeding with cycle 14 today as scheduled with close monitoring of this region on subsequent imaging.   For his peripheral neuropathy, he will continue on his current regimen of Gabapentin 600mg  TID.   We will see the patient back in 4 weeks for evaluation prior to starting cycle #15 of his treatment.   For his diarrhea, the patient was encouraged to use imodium OTC for his symptoms.  The patient was advised to call immediately if he has any concerning symptoms in the interval. The patient voices understanding of current disease status  and treatment options and is in agreement with the current care plan. All questions were answered. The patient knows to call the clinic with any problems, questions or concerns. We can certainly see the patient much sooner if necessary   Orders Placed This Encounter  Procedures  . CBC with Differential (Cancer Center Only)    Standing Status:   Standing    Number of Occurrences:   5    Standing Expiration Date:   01/05/2020  . CMP ([facility] only)    Standing Status:   Standing  Number of Occurrences:   5    Standing Expiration Date:   01/05/2020  . TSH    Standing Status:   Standing    Number of Occurrences:   5    Standing Expiration Date:   01/05/2020     Tobe Sos Heilingoetter, PA-C 01/05/19  ADDENDUM: Hematology/Oncology Attending: I had a face-to-face encounter with the patient.  I recommended his care plan.  This is a very pleasant 68 years old white male with metastatic non-small cell lung cancer, squamous cell carcinoma status post 6 cycles of systemic chemotherapy with carboplatin and paclitaxel with partial response.  The patient is currently on second line treatment with nivolumab 480 mg IV every 4 weeks status post 13 cycles.  He has been tolerating this treatment well with no concerning adverse effects. He had repeat CT scan of the chest, abdomen and pelvis performed recently.  I personally and independently reviewed the scans and discussed the results with the patient today. Has a scan showed no concerning findings for disease progression. I recommended for the patient to continue his current treatment with nivolumab with the same dose. For the peripheral neuropathy, he will continue his current treatment with Neurontin. The patient will come back for follow-up visit in 4 weeks for evaluation before the next cycle of his treatment. He was advised to call immediately if he has any concerning symptoms in the interval.  Disclaimer: This note was dictated with voice  recognition software. Similar sounding words can inadvertently be transcribed and may be missed upon review. Eilleen Kempf, MD 01/07/19

## 2019-01-05 NOTE — Patient Instructions (Signed)
[facility] Discharge Instructions for Patients Receiving Chemotherapy  Today you received the following immunotherapy agents:  Opdivo  To help prevent nausea and vomiting after your treatment, we encourage you to take your nausea medication as prescribed.   If you develop nausea and vomiting that is not controlled by your nausea medication, call the clinic.   BELOW ARE SYMPTOMS THAT SHOULD BE REPORTED IMMEDIATELY:  *FEVER GREATER THAN 100.5 F  *CHILLS WITH OR WITHOUT FEVER  NAUSEA AND VOMITING THAT IS NOT CONTROLLED WITH YOUR NAUSEA MEDICATION  *UNUSUAL SHORTNESS OF BREATH  *UNUSUAL BRUISING OR BLEEDING  TENDERNESS IN MOUTH AND THROAT WITH OR WITHOUT PRESENCE OF ULCERS  *URINARY PROBLEMS  *BOWEL PROBLEMS  UNUSUAL RASH Items with * indicate a potential emergency and should be followed up as soon as possible.  Feel free to call the clinic should you have any questions or concerns. The clinic phone number is [phone].  Please show the [facility] at check-in to the Emergency Department and triage nurse.

## 2019-01-08 ENCOUNTER — Telehealth: Payer: Self-pay | Admitting: Internal Medicine

## 2019-01-08 NOTE — Telephone Encounter (Signed)
Patient already on schedule as requested per 3/20 los

## 2019-01-10 ENCOUNTER — Other Ambulatory Visit: Payer: Self-pay | Admitting: Internal Medicine

## 2019-01-10 ENCOUNTER — Other Ambulatory Visit: Payer: Self-pay | Admitting: Medical Oncology

## 2019-01-10 DIAGNOSIS — G4700 Insomnia, unspecified: Secondary | ICD-10-CM

## 2019-01-10 DIAGNOSIS — C7800 Secondary malignant neoplasm of unspecified lung: Secondary | ICD-10-CM

## 2019-01-12 ENCOUNTER — Ambulatory Visit: Payer: Medicare Other | Admitting: Pulmonary Disease

## 2019-01-16 ENCOUNTER — Encounter: Payer: Self-pay | Admitting: General Practice

## 2019-01-16 NOTE — Progress Notes (Signed)
[facility] Team contacted patient to assess for food insecurity and other psychosocial needs during current COVID19 pandemic.    Patient/family expressed no needs at this time.  Support Team member encouraged patient to call if changes occur or they have any other questions/concerns.    Beverely Pace, [place], [place] Worker Phone:  [phone]

## 2019-01-24 ENCOUNTER — Telehealth: Payer: Self-pay | Admitting: Physician Assistant

## 2019-01-24 ENCOUNTER — Telehealth: Payer: Self-pay | Admitting: Internal Medicine

## 2019-01-24 NOTE — Telephone Encounter (Signed)
R/s appt per 4/7 sch message - unable to reach patient - called twice and someone answered but did not talk .

## 2019-01-24 NOTE — Telephone Encounter (Signed)
Called the patient and left a message regarding his appointment for tomorrow on 4/9. His schedule was adjusted considering his receives his treatment every 4 weeks and he was scheduled for one week too early. I left a voicemail/message that his next appointment time is rescheduled for 4/16 at 9:30 a.m. I left our number to call us back should he have any further questions regarding his schedule.

## 2019-01-25 ENCOUNTER — Other Ambulatory Visit: Payer: Medicare Other

## 2019-01-25 ENCOUNTER — Ambulatory Visit: Payer: Medicare Other

## 2019-01-25 ENCOUNTER — Ambulatory Visit: Payer: Medicare Other | Admitting: Physician Assistant

## 2019-01-29 ENCOUNTER — Other Ambulatory Visit: Payer: Self-pay | Admitting: Internal Medicine

## 2019-01-29 ENCOUNTER — Telehealth: Payer: Self-pay | Admitting: Medical Oncology

## 2019-01-29 DIAGNOSIS — C7931 Secondary malignant neoplasm of brain: Secondary | ICD-10-CM

## 2019-01-29 DIAGNOSIS — C7800 Secondary malignant neoplasm of unspecified lung: Secondary | ICD-10-CM

## 2019-01-29 DIAGNOSIS — Z5111 Encounter for antineoplastic chemotherapy: Secondary | ICD-10-CM

## 2019-01-29 MED ORDER — GABAPENTIN 300 MG PO CAPS: ORAL_CAPSULE | ORAL | 3 refills | Status: DC

## 2019-01-29 NOTE — Telephone Encounter (Addendum)
Per [facility] pharmacist, pts  last neurontin rx was filled 3/19 300 mg 2 capsules tid from the prescription  dated 11/06/2018.   [name] [name] had ordered it 01/05/19 but it was marked as printed  and pt said he did not one at his visit. Late entry - pt informed that he needs tot contact his PCP for referral to podiatrist for evaluation for toenail  cutting .

## 2019-02-01 ENCOUNTER — Ambulatory Visit: Payer: Medicare Other | Admitting: Physician Assistant

## 2019-02-01 ENCOUNTER — Inpatient Hospital Stay: Payer: Medicare Other

## 2019-02-01 ENCOUNTER — Inpatient Hospital Stay: Payer: Medicare Other | Attending: Internal Medicine

## 2019-02-01 ENCOUNTER — Inpatient Hospital Stay (HOSPITAL_BASED_OUTPATIENT_CLINIC_OR_DEPARTMENT_OTHER): Payer: Medicare Other | Admitting: Internal Medicine

## 2019-02-01 ENCOUNTER — Other Ambulatory Visit: Payer: Medicare Other

## 2019-02-01 ENCOUNTER — Other Ambulatory Visit: Payer: Self-pay

## 2019-02-01 ENCOUNTER — Ambulatory Visit: Payer: Medicare Other

## 2019-02-01 ENCOUNTER — Encounter: Payer: Self-pay | Admitting: Internal Medicine

## 2019-02-01 VITALS — BP 135/85 | HR 81 | Temp 97.6°F | Resp 18 | Ht 73.0 in | Wt 207.3 lb

## 2019-02-01 DIAGNOSIS — C3411 Malignant neoplasm of upper lobe, right bronchus or lung: Secondary | ICD-10-CM | POA: Diagnosis present

## 2019-02-01 DIAGNOSIS — C3412 Malignant neoplasm of upper lobe, left bronchus or lung: Secondary | ICD-10-CM | POA: Diagnosis present

## 2019-02-01 DIAGNOSIS — G620 Drug-induced polyneuropathy: Secondary | ICD-10-CM

## 2019-02-01 DIAGNOSIS — Z5112 Encounter for antineoplastic immunotherapy: Secondary | ICD-10-CM | POA: Insufficient documentation

## 2019-02-01 DIAGNOSIS — T451X5A Adverse effect of antineoplastic and immunosuppressive drugs, initial encounter: Secondary | ICD-10-CM

## 2019-02-01 DIAGNOSIS — Z79899 Other long term (current) drug therapy: Secondary | ICD-10-CM | POA: Diagnosis not present

## 2019-02-01 DIAGNOSIS — C3490 Malignant neoplasm of unspecified part of unspecified bronchus or lung: Secondary | ICD-10-CM

## 2019-02-01 DIAGNOSIS — C7800 Secondary malignant neoplasm of unspecified lung: Secondary | ICD-10-CM

## 2019-02-01 DIAGNOSIS — R5383 Other fatigue: Secondary | ICD-10-CM

## 2019-02-01 LAB — CMP (CANCER CENTER ONLY)
ALT: 10 U/L (ref 0–44)
AST: 12 U/L — ABNORMAL LOW (ref 15–41)
Albumin: 3.8 g/dL (ref 3.5–5.0)
Alkaline Phosphatase: 73 U/L (ref 38–126)
Anion gap: 11 (ref 5–15)
BUN: 11 mg/dL (ref 8–23)
CO2: 22 mmol/L (ref 22–32)
Calcium: 8.9 mg/dL (ref 8.9–10.3)
Chloride: 103 mmol/L (ref 98–111)
Creatinine: 1.03 mg/dL (ref 0.61–1.24)
GFR, Est AFR Am: >60 mL/min (ref >60)
GFR, Estimated: >60 mL/min (ref >60)
Glucose, Bld: 137 mg/dL — ABNORMAL HIGH (ref 70–99)
Potassium: 4.3 mmol/L (ref 3.5–5.1)
Sodium: 136 mmol/L (ref 135–145)
Total Bilirubin: 0.2 mg/dL — ABNORMAL LOW (ref 0.3–1.2)
Total Protein: 7.4 g/dL (ref 6.5–8.1)

## 2019-02-01 LAB — CBC WITH DIFFERENTIAL (CANCER CENTER ONLY)
Abs Immature Granulocytes: 0.02 10*3/uL (ref 0.00–0.07)
Basophils Absolute: 0.1 10*3/uL (ref 0.0–0.1)
Basophils Relative: 1 %
Eosinophils Absolute: 0.2 10*3/uL (ref 0.0–0.5)
Eosinophils Relative: 2 %
HCT: 46.1 % (ref 39.0–52.0)
Hemoglobin: 14.7 g/dL (ref 13.0–17.0)
Immature Granulocytes: 0 %
Lymphocytes Relative: 42 %
Lymphs Abs: 3.8 10*3/uL (ref 0.7–4.0)
MCH: 29.3 pg (ref 26.0–34.0)
MCHC: 31.9 g/dL (ref 30.0–36.0)
MCV: 91.8 fL (ref 80.0–100.0)
Monocytes Absolute: 0.6 10*3/uL (ref 0.1–1.0)
Monocytes Relative: 7 %
Neutro Abs: 4.3 10*3/uL (ref 1.7–7.7)
Neutrophils Relative %: 48 %
Platelet Count: 281 10*3/uL (ref 150–400)
RBC: 5.02 MIL/uL (ref 4.22–5.81)
RDW: 15.4 % (ref 11.5–15.5)
WBC Count: 9 10*3/uL (ref 4.0–10.5)
nRBC: 0 % (ref 0.0–0.2)

## 2019-02-01 LAB — TSH: TSH: 3.282 u[IU]/mL (ref 0.320–4.118)

## 2019-02-01 MED ORDER — SODIUM CHLORIDE 0.9 % IV SOLN
480.0000 mg | Freq: Once | INTRAVENOUS | Status: AC
  Administered 2019-02-01: 480 mg via INTRAVENOUS
  Filled 2019-02-01: qty 48

## 2019-02-01 MED ORDER — SODIUM CHLORIDE 0.9 % IV SOLN
Freq: Once | INTRAVENOUS | Status: AC
  Administered 2019-02-01: 12:00:00 via INTRAVENOUS
  Filled 2019-02-01: qty 250

## 2019-02-01 NOTE — Progress Notes (Signed)
[place] [phone]   [phone]  OFFICE PROGRESS NOTE  System, Pcp Not In No address on file  DIAGNOSIS: Stage IV (T3, N0, M1 a) non-small cell lung cancer, squamous cell carcinoma presented with multiple bilateral pulmonary nodules diagnosed in April 2018.  PRIOR THERAPY:  Systemic chemotherapy with carboplatin for AUC of 5 and paclitaxel 175 MG/M2 every 3 weeks with Neulasta support. Status post 6 cycles.  CURRENT THERAPY: Second line treatment with immunotherapy was Nivolumab 480 mg IV every 4 weeks.  First cycle November 14, 2017.  Status post 14 cycles.  INTERVAL HISTORY: [name] [name] 68 y.o. male returns to the clinic today for follow-up visit.  The patient is feeling fine today with no concerning complaints.  He quit working his current company because of some dispute regarding the working condition.  He denied having any current chest pain shortness of breath, cough or hemoptysis.  He denied having any nausea, vomiting, diarrhea or constipation.  He denied having any weight loss or night sweats.  He is here today for evaluation before starting cycle 15.  MEDICAL HISTORY: Past Medical History:  Diagnosis Date  . Depression 01/29/2017  . Dyspnea   . Encounter for antineoplastic chemotherapy 01/29/2017  . Goals of care, counseling/discussion 01/29/2017  . Hypertension   . Metastatic squamous cell carcinoma to lung, unspecified laterality ([place]) 01/28/2017  . Stroke [facility])     ALLERGIES:  has No Known Allergies.  MEDICATIONS:  Current Outpatient Medications  Medication Sig Dispense Refill  . acetaminophen (TYLENOL) 325 MG tablet You can take 2 every 4 hours as needed.  You can buy it over the counter DO NOT TAKE MORE THAN 4000 MG OF TYLENOL PER DAY.  IT CAN HARM YOUR LIVER.    Marland Kitchen amitriptyline (ELAVIL) 100 MG tablet Take 1 tablet (100 mg total) by mouth at bedtime. 30 tablet 3  . aspirin EC 81 MG tablet Take 81 mg by mouth daily.    Marland Kitchen  DIFICID 200 MG TABS tablet     . gabapentin (NEURONTIN) 300 MG capsule TAKE 2 CAPSULES BY MOUTH 3 TIMES DAILY 90 capsule 3  . hydrochlorothiazide (HYDRODIURIL) 25 MG tablet Restart this after you see your Primary care doctor.  Check your blood pressure at home and record.    Marland Kitchen lisinopril (PRINIVIL,ZESTRIL) 40 MG tablet Take 40 mg by mouth daily.    . metoprolol succinate (TOPROL-XL) 50 MG 24 hr tablet 50 mg.  0  . mirtazapine (REMERON) 30 MG tablet Take 1 tablet (30 mg total) by mouth at bedtime. 30 tablet 1  . nortriptyline (PAMELOR) 10 MG capsule TAKE 1 CAPSULE BY MOUTH TWICE DAILY(1 WITH EVENING MEAL AND 1 AT BEDTIME) TO SLOW OVERACTIVE BOWEL MUSCLES FOR 30 DAYS    . pantoprazole (PROTONIX) 40 MG tablet Take 1 tablet (40 mg total) by mouth 2 (two) times daily. 60 tablet 1  . ranitidine (ZANTAC) 150 MG tablet Take 150 mg by mouth 2 (two) times daily.    . simvastatin (ZOCOR) 20 MG tablet Take 20 mg by mouth daily.    . simvastatin (ZOCOR) 5 MG tablet Take 1 tablet (5 mg total) by mouth daily. 5 tablet 3  . temazepam (RESTORIL) 30 MG capsule TAKE 1 CAPSULE BY MOUTH ONCE DAILY AT BEDTIME AS NEEDED FOR SLEEP 30 capsule 0   No current facility-administered medications for this visit.     SURGICAL HISTORY:  Past Surgical History:  Procedure Laterality Date  .  BACK SURGERY     cyst removal  . COLONOSCOPY    . LAPAROTOMY N/A 02/25/2017   Procedure: EXPLORATORY LAPAROTOMY MODIFIED GRAHAM'S PATCH;  Surgeon: Kinsinger, Arta Bruce, MD;  Location: [facility];  Service: General;  Laterality: N/A;  . MINOR PLACEMENT OF FIDUCIAL N/A 01/19/2017   Procedure: MINOR PLACEMENT OF FIDUCIAL x 6;  Surgeon: Collene Gobble, MD;  Location: [place];  Service: Thoracic;  Laterality: N/A;  . VIDEO BRONCHOSCOPY WITH ENDOBRONCHIAL NAVIGATION N/A 01/19/2017   Procedure: VIDEO BRONCHOSCOPY WITH ENDOBRONCHIAL NAVIGATION;  Surgeon: Collene Gobble, MD;  Location: [place];  Service: Thoracic;  Laterality: N/A;    REVIEW OF SYSTEMS:   A comprehensive review of systems was negative.   PHYSICAL EXAMINATION: General appearance: alert, cooperative and no distress Head: Normocephalic, without obvious abnormality, atraumatic Neck: no adenopathy, no JVD, supple, symmetrical, trachea midline and thyroid not enlarged, symmetric, no tenderness/mass/nodules Lymph nodes: Cervical, supraclavicular, and axillary nodes normal. Resp: clear to auscultation bilaterally Back: symmetric, no curvature. ROM normal. No CVA tenderness. Cardio: regular rate and rhythm, S1, S2 normal, no murmur, click, rub or gallop GI: soft, non-tender; bowel sounds normal; no masses,  no organomegaly Extremities: extremities normal, atraumatic, no cyanosis or edema  ECOG PERFORMANCE STATUS: 1 - Symptomatic but completely ambulatory   Blood pressure 135/85, pulse 81, temperature 97.6 F (36.4 C), temperature source Oral, resp. rate 18, height 6\' 1"  (1.854 m), weight 207 lb 4.8 oz (94 kg), SpO2 100 %.  LABORATORY DATA: Lab Results  Component Value Date   WBC 9.0 02/01/2019   HGB 14.7 02/01/2019   HCT 46.1 02/01/2019   MCV 91.8 02/01/2019   PLT 281 02/01/2019      Chemistry      Component Value Date/Time   NA 136 02/01/2019 1043   NA 139 10/06/2017 1052   K 4.3 02/01/2019 1043   K 4.2 10/06/2017 1052   CL 103 02/01/2019 1043   CO2 22 02/01/2019 1043   CO2 25 10/06/2017 1052   BUN 11 02/01/2019 1043   BUN 13.2 10/06/2017 1052   CREATININE 1.03 02/01/2019 1043   CREATININE 0.9 10/06/2017 1052      Component Value Date/Time   CALCIUM 8.9 02/01/2019 1043   CALCIUM 9.5 10/06/2017 1052   ALKPHOS 73 02/01/2019 1043   ALKPHOS 68 10/06/2017 1052   AST 12 (L) 02/01/2019 1043   AST 14 10/06/2017 1052   ALT 10 02/01/2019 1043   ALT 13 10/06/2017 1052   BILITOT 0.2 (L) 02/01/2019 1043   BILITOT 0.44 10/06/2017 1052       RADIOGRAPHIC STUDIES: No results found.  ASSESSMENT AND PLAN:  This is a very pleasant 68 years old white male with stage  IV non-small cell lung cancer, squamous cell carcinoma presented with multiple bilateral pulmonary nodules. The patient was treated with systemic chemotherapy with carboplatin and paclitaxel status post 6 cycles. He tolerated the last cycle of his treatment well except for the persistent peripheral neuropathy. The patient has been in observation for the last 3 months. He had a repeat CT scan of the chest, abdomen and pelvis performed recently.  Has a scan showed mild interval enlargement of left upper lobe as well as right upper lobe pulmonary nodules concerning for disease progression. The patient was started on treatment with immunotherapy with Nivolumab 480 mg IV every 4 weeks status post 13 cycles.   The patient has been tolerating this treatment well with no concerning adverse effects. I recommended for him to  proceed with cycle #14 today as scheduled. I will see him back for follow-up visit in 4 weeks for evaluation before starting cycle #15 with repeat CT scan of the chest, abdomen and pelvis for restaging of his disease. The patient was advised to call immediately if she has any concerning symptoms in the interval. For the peripheral neuropathy he will continue his current treatment with Neurontin 600 mg p.o. 3 times daily. The patient voices understanding of current disease status and treatment options and is in agreement with the current care plan. All questions were answered. The patient knows to call the clinic with any problems, questions or concerns. We can certainly see the patient much sooner if necessary.  Disclaimer: This note was dictated with voice recognition software. Similar sounding words can inadvertently be transcribed and may not be corrected upon review.

## 2019-02-01 NOTE — Patient Instructions (Signed)
[place] Discharge Instructions for Patients Receiving Chemotherapy  Today you received the following immunotherapy agents:  Opdivo  To help prevent nausea and vomiting after your treatment, we encourage you to take your nausea medication as prescribed.   If you develop nausea and vomiting that is not controlled by your nausea medication, call the clinic.   BELOW ARE SYMPTOMS THAT SHOULD BE REPORTED IMMEDIATELY:  *FEVER GREATER THAN 100.5 F  *CHILLS WITH OR WITHOUT FEVER  NAUSEA AND VOMITING THAT IS NOT CONTROLLED WITH YOUR NAUSEA MEDICATION  *UNUSUAL SHORTNESS OF BREATH  *UNUSUAL BRUISING OR BLEEDING  TENDERNESS IN MOUTH AND THROAT WITH OR WITHOUT PRESENCE OF ULCERS  *URINARY PROBLEMS  *BOWEL PROBLEMS  UNUSUAL RASH Items with * indicate a potential emergency and should be followed up as soon as possible.  Feel free to call the clinic should you have any questions or concerns. The clinic phone number is [phone].  Please show the [place] at check-in to the Emergency Department and triage nurse.

## 2019-02-13 ENCOUNTER — Other Ambulatory Visit: Payer: Self-pay | Admitting: Medical Oncology

## 2019-02-13 ENCOUNTER — Other Ambulatory Visit: Payer: Self-pay | Admitting: Internal Medicine

## 2019-02-13 DIAGNOSIS — G4700 Insomnia, unspecified: Secondary | ICD-10-CM

## 2019-02-13 DIAGNOSIS — C7800 Secondary malignant neoplasm of unspecified lung: Secondary | ICD-10-CM

## 2019-02-13 MED ORDER — TEMAZEPAM 30 MG PO CAPS: ORAL_CAPSULE | ORAL | 1 refills | Status: DC

## 2019-02-22 ENCOUNTER — Telehealth: Payer: Self-pay | Admitting: Medical Oncology

## 2019-02-22 ENCOUNTER — Other Ambulatory Visit: Payer: Medicare Other

## 2019-02-22 ENCOUNTER — Ambulatory Visit: Payer: Medicare Other | Admitting: Physician Assistant

## 2019-02-22 ENCOUNTER — Ambulatory Visit: Payer: Medicare Other

## 2019-02-22 ENCOUNTER — Ambulatory Visit: Payer: Medicare Other | Admitting: Internal Medicine

## 2019-02-22 NOTE — Telephone Encounter (Signed)
Asking for all appt to be on 5/12 since he drives from [employer] sent to scheduler. I called radiology to do a stat read on pt CT scan.

## 2019-02-22 NOTE — Telephone Encounter (Signed)
Pt walked in today expecting treatment . He said he did not know his appts were cancelled. I gave pt a printout of his next appts for CT scan may 12.

## 2019-02-23 ENCOUNTER — Telehealth: Payer: Self-pay | Admitting: *Deleted

## 2019-02-23 NOTE — Telephone Encounter (Signed)
Received vm message from patient regarding upcoming appts.  TCT patient.  He is asking to have all his appts on the same day as he lives 2 hours away. His schedule said he has CT Scans on 02/27/19 and then appts here at cancer center on the 14th. Reviewed schedule with him and advised that his schedule has been changed already and all his appts are on the 12th of May. Reaasured patient that we are going by the schedule that is in Epic. He voiced undertsanding. No further questions or concerns

## 2019-02-27 ENCOUNTER — Ambulatory Visit (HOSPITAL_COMMUNITY)
Admission: RE | Admit: 2019-02-27 | Discharge: 2019-02-27 | Disposition: A | Discharge status: Home / Self Care | Payer: Medicare Other | Source: Ambulatory Visit | Attending: Internal Medicine | Admitting: Internal Medicine

## 2019-02-27 ENCOUNTER — Inpatient Hospital Stay: Payer: Medicare Other

## 2019-02-27 ENCOUNTER — Encounter: Payer: Self-pay | Admitting: Internal Medicine

## 2019-02-27 ENCOUNTER — Other Ambulatory Visit: Payer: Self-pay

## 2019-02-27 ENCOUNTER — Ambulatory Visit (HOSPITAL_COMMUNITY): Payer: Medicare Other

## 2019-02-27 ENCOUNTER — Encounter (HOSPITAL_COMMUNITY): Payer: Self-pay

## 2019-02-27 ENCOUNTER — Inpatient Hospital Stay: Payer: Medicare Other | Attending: Internal Medicine

## 2019-02-27 ENCOUNTER — Inpatient Hospital Stay (HOSPITAL_BASED_OUTPATIENT_CLINIC_OR_DEPARTMENT_OTHER): Payer: Medicare Other | Admitting: Internal Medicine

## 2019-02-27 VITALS — BP 131/88 | HR 82 | Temp 97.3°F | Resp 18 | Ht 73.0 in | Wt 207.1 lb

## 2019-02-27 DIAGNOSIS — M545 Low back pain: Secondary | ICD-10-CM

## 2019-02-27 DIAGNOSIS — Z5112 Encounter for antineoplastic immunotherapy: Secondary | ICD-10-CM | POA: Insufficient documentation

## 2019-02-27 DIAGNOSIS — C3490 Malignant neoplasm of unspecified part of unspecified bronchus or lung: Secondary | ICD-10-CM

## 2019-02-27 DIAGNOSIS — C779 Secondary and unspecified malignant neoplasm of lymph node, unspecified: Secondary | ICD-10-CM | POA: Insufficient documentation

## 2019-02-27 DIAGNOSIS — C7800 Secondary malignant neoplasm of unspecified lung: Secondary | ICD-10-CM

## 2019-02-27 DIAGNOSIS — G63 Polyneuropathy in diseases classified elsewhere: Secondary | ICD-10-CM | POA: Insufficient documentation

## 2019-02-27 DIAGNOSIS — C3412 Malignant neoplasm of upper lobe, left bronchus or lung: Secondary | ICD-10-CM | POA: Diagnosis present

## 2019-02-27 DIAGNOSIS — C7931 Secondary malignant neoplasm of brain: Secondary | ICD-10-CM

## 2019-02-27 DIAGNOSIS — C3411 Malignant neoplasm of upper lobe, right bronchus or lung: Secondary | ICD-10-CM

## 2019-02-27 DIAGNOSIS — R5383 Other fatigue: Secondary | ICD-10-CM

## 2019-02-27 DIAGNOSIS — G620 Drug-induced polyneuropathy: Secondary | ICD-10-CM

## 2019-02-27 DIAGNOSIS — I10 Essential (primary) hypertension: Secondary | ICD-10-CM

## 2019-02-27 LAB — CMP (CANCER CENTER ONLY)
ALT: 14 U/L (ref 0–44)
AST: 11 U/L — ABNORMAL LOW (ref 15–41)
Albumin: 3.9 g/dL (ref 3.5–5.0)
Alkaline Phosphatase: 73 U/L (ref 38–126)
Anion gap: 9 (ref 5–15)
BUN: 13 mg/dL (ref 8–23)
CO2: 28 mmol/L (ref 22–32)
Calcium: 9.1 mg/dL (ref 8.9–10.3)
Chloride: 101 mmol/L (ref 98–111)
Creatinine: 1.05 mg/dL (ref 0.61–1.24)
GFR, Est AFR Am: >60 mL/min (ref >60)
GFR, Estimated: >60 mL/min (ref >60)
Glucose, Bld: 96 mg/dL (ref 70–99)
Potassium: 4.5 mmol/L (ref 3.5–5.1)
Sodium: 138 mmol/L (ref 135–145)
Total Bilirubin: 0.3 mg/dL (ref 0.3–1.2)
Total Protein: 7.8 g/dL (ref 6.5–8.1)

## 2019-02-27 LAB — CBC WITH DIFFERENTIAL (CANCER CENTER ONLY)
Abs Immature Granulocytes: 0.02 10*3/uL (ref 0.00–0.07)
Basophils Absolute: 0.1 10*3/uL (ref 0.0–0.1)
Basophils Relative: 1 %
Eosinophils Absolute: 0.2 10*3/uL (ref 0.0–0.5)
Eosinophils Relative: 2 %
HCT: 46.3 % (ref 39.0–52.0)
Hemoglobin: 14.9 g/dL (ref 13.0–17.0)
Immature Granulocytes: 0 %
Lymphocytes Relative: 34 %
Lymphs Abs: 3.1 10*3/uL (ref 0.7–4.0)
MCH: 29.5 pg (ref 26.0–34.0)
MCHC: 32.2 g/dL (ref 30.0–36.0)
MCV: 91.7 fL (ref 80.0–100.0)
Monocytes Absolute: 0.7 10*3/uL (ref 0.1–1.0)
Monocytes Relative: 8 %
Neutro Abs: 4.8 10*3/uL (ref 1.7–7.7)
Neutrophils Relative %: 55 %
Platelet Count: 287 10*3/uL (ref 150–400)
RBC: 5.05 MIL/uL (ref 4.22–5.81)
RDW: 14.2 % (ref 11.5–15.5)
WBC Count: 8.9 10*3/uL (ref 4.0–10.5)
nRBC: 0 % (ref 0.0–0.2)

## 2019-02-27 MED ORDER — SODIUM CHLORIDE 0.9 % IV SOLN
Freq: Once | INTRAVENOUS | Status: AC
  Administered 2019-02-27: 12:00:00 via INTRAVENOUS
  Filled 2019-02-27: qty 250

## 2019-02-27 MED ORDER — SODIUM CHLORIDE 0.9 % IV SOLN
480.0000 mg | Freq: Once | INTRAVENOUS | Status: AC
  Administered 2019-02-27: 480 mg via INTRAVENOUS
  Filled 2019-02-27: qty 48

## 2019-02-27 MED ORDER — SODIUM CHLORIDE (PF) 0.9 % IJ SOLN
INTRAMUSCULAR | Status: AC
  Filled 2019-02-27: qty 50

## 2019-02-27 MED ORDER — IOHEXOL 300 MG/ML  SOLN
100.0000 mL | Freq: Once | INTRAMUSCULAR | Status: AC | PRN
  Administered 2019-02-27: 11:00:00 100 mL via INTRAVENOUS

## 2019-02-27 NOTE — Progress Notes (Signed)
TSH drawn incorrectly before pt's MD visit, however CBC and CMP were drawn correctly per lab.  D/t stable TSH from last lab draw MD Mohamed gave VO to cancel today's TSH.  Lab made aware.  Charge RN Amy made aware.

## 2019-02-27 NOTE — Progress Notes (Signed)
[place] [phone]   [phone]  OFFICE PROGRESS NOTE  System, Pcp Not In No address on file  DIAGNOSIS: Stage IV (T3, N0, M1 a) non-small cell lung cancer, squamous cell carcinoma presented with multiple bilateral pulmonary nodules diagnosed in April 2018.  PRIOR THERAPY:  Systemic chemotherapy with carboplatin for AUC of 5 and paclitaxel 175 MG/M2 every 3 weeks with Neulasta support. Status post 6 cycles.  CURRENT THERAPY: Second line treatment with immunotherapy was Nivolumab 480 mg IV every 4 weeks.  First cycle November 14, 2017.  Status post 15 cycles.  INTERVAL HISTORY: [name] [name] 68 y.o. male returns to the clinic today for follow-up visit.  The patient is feeling fine today with no concerning complaints except for intermittent low back pain.  He denied having any current chest pain, shortness of breath, cough or hemoptysis.  He has no significant weight loss or night sweats.  He has no nausea, vomiting, diarrhea or constipation.  He has no headache or visual changes.  The patient had CT scan of the chest, abdomen pelvis performed earlier today but the final report is still pending.  He is here for evaluation before starting cycle #16 of his treatment.  MEDICAL HISTORY: Past Medical History:  Diagnosis Date  . Depression 01/29/2017  . Dyspnea   . Encounter for antineoplastic chemotherapy 01/29/2017  . Goals of care, counseling/discussion 01/29/2017  . Hypertension   . Metastatic squamous cell carcinoma to lung, unspecified laterality ([place]) 01/28/2017  . Stroke [facility])     ALLERGIES:  has No Known Allergies.  MEDICATIONS:  Current Outpatient Medications  Medication Sig Dispense Refill  . acetaminophen (TYLENOL) 325 MG tablet You can take 2 every 4 hours as needed.  You can buy it over the counter DO NOT TAKE MORE THAN 4000 MG OF TYLENOL PER DAY.  IT CAN HARM YOUR LIVER.    Marland Kitchen amitriptyline (ELAVIL) 100 MG tablet Take 1 tablet (100 mg  total) by mouth at bedtime. 30 tablet 3  . aspirin EC 81 MG tablet Take 81 mg by mouth daily.    Marland Kitchen DIFICID 200 MG TABS tablet     . gabapentin (NEURONTIN) 300 MG capsule TAKE 2 CAPSULES BY MOUTH 3 TIMES DAILY 90 capsule 3  . hydrochlorothiazide (HYDRODIURIL) 25 MG tablet Restart this after you see your Primary care doctor.  Check your blood pressure at home and record.    Marland Kitchen lisinopril (PRINIVIL,ZESTRIL) 40 MG tablet Take 40 mg by mouth daily.    . metoprolol succinate (TOPROL-XL) 50 MG 24 hr tablet 50 mg.  0  . mirtazapine (REMERON) 30 MG tablet Take 1 tablet (30 mg total) by mouth at bedtime. 30 tablet 1  . nortriptyline (PAMELOR) 10 MG capsule TAKE 1 CAPSULE BY MOUTH TWICE DAILY(1 WITH EVENING MEAL AND 1 AT BEDTIME) TO SLOW OVERACTIVE BOWEL MUSCLES FOR 30 DAYS    . pantoprazole (PROTONIX) 40 MG tablet Take 1 tablet (40 mg total) by mouth 2 (two) times daily. 60 tablet 1  . ranitidine (ZANTAC) 150 MG tablet Take 150 mg by mouth 2 (two) times daily.    . simvastatin (ZOCOR) 20 MG tablet Take 20 mg by mouth daily.    . simvastatin (ZOCOR) 5 MG tablet Take 1 tablet (5 mg total) by mouth daily. 5 tablet 3  . temazepam (RESTORIL) 30 MG capsule TAKE 1 CAPSULE BY MOUTH ONCE DAILY AT BEDTIME AS NEEDED FOR SLEEP 30 capsule 0  . temazepam (  RESTORIL) 30 MG capsule Take one tablet qhs prn sleep 30 capsule 1   No current facility-administered medications for this visit.    Facility-Administered Medications Ordered in Other Visits  Medication Dose Route Frequency Provider Last Rate Last Dose  . sodium chloride (PF) 0.9 % injection             SURGICAL HISTORY:  Past Surgical History:  Procedure Laterality Date  . BACK SURGERY     cyst removal  . COLONOSCOPY    . LAPAROTOMY N/A 02/25/2017   Procedure: EXPLORATORY LAPAROTOMY MODIFIED GRAHAM'S PATCH;  Surgeon: Kinsinger, Arta Bruce, MD;  Location: [facility];  Service: General;  Laterality: N/A;  . MINOR PLACEMENT OF FIDUCIAL N/A 01/19/2017   Procedure:  MINOR PLACEMENT OF FIDUCIAL x 6;  Surgeon: Collene Gobble, MD;  Location: [place];  Service: Thoracic;  Laterality: N/A;  . VIDEO BRONCHOSCOPY WITH ENDOBRONCHIAL NAVIGATION N/A 01/19/2017   Procedure: VIDEO BRONCHOSCOPY WITH ENDOBRONCHIAL NAVIGATION;  Surgeon: Collene Gobble, MD;  Location: [place];  Service: Thoracic;  Laterality: N/A;    REVIEW OF SYSTEMS:  Constitutional: negative Eyes: negative Ears, nose, mouth, throat, and face: negative Respiratory: negative Cardiovascular: negative Gastrointestinal: negative Genitourinary:negative Integument/breast: negative Hematologic/lymphatic: negative Musculoskeletal:positive for back pain Neurological: negative Behavioral/Psych: negative Endocrine: negative Allergic/Immunologic: negative   PHYSICAL EXAMINATION: General appearance: alert, cooperative and no distress Head: Normocephalic, without obvious abnormality, atraumatic Neck: no adenopathy, no JVD, supple, symmetrical, trachea midline and thyroid not enlarged, symmetric, no tenderness/mass/nodules Lymph nodes: Cervical, supraclavicular, and axillary nodes normal. Resp: clear to auscultation bilaterally Back: symmetric, no curvature. ROM normal. No CVA tenderness. Cardio: regular rate and rhythm, S1, S2 normal, no murmur, click, rub or gallop GI: soft, non-tender; bowel sounds normal; no masses,  no organomegaly Extremities: extremities normal, atraumatic, no cyanosis or edema Neurologic: Alert and oriented X 3, normal strength and tone. Normal symmetric reflexes. Normal coordination and gait  ECOG PERFORMANCE STATUS: 1 - Symptomatic but completely ambulatory   Blood pressure 131/88, pulse 82, temperature (!) 97.3 F (36.3 C), temperature source Oral, resp. rate 18, height 6\' 1"  (1.854 m), weight 207 lb 1.6 oz (93.9 kg), SpO2 96 %.  LABORATORY DATA: Lab Results  Component Value Date   WBC 8.9 02/27/2019   HGB 14.9 02/27/2019   HCT 46.3 02/27/2019   MCV 91.7 02/27/2019   PLT 287  02/27/2019      Chemistry      Component Value Date/Time   NA 138 02/27/2019 1043   NA 139 10/06/2017 1052   K 4.5 02/27/2019 1043   K 4.2 10/06/2017 1052   CL 101 02/27/2019 1043   CO2 28 02/27/2019 1043   CO2 25 10/06/2017 1052   BUN 13 02/27/2019 1043   BUN 13.2 10/06/2017 1052   CREATININE 1.05 02/27/2019 1043   CREATININE 0.9 10/06/2017 1052      Component Value Date/Time   CALCIUM 9.1 02/27/2019 1043   CALCIUM 9.5 10/06/2017 1052   ALKPHOS 73 02/27/2019 1043   ALKPHOS 68 10/06/2017 1052   AST 11 (L) 02/27/2019 1043   AST 14 10/06/2017 1052   ALT 14 02/27/2019 1043   ALT 13 10/06/2017 1052   BILITOT 0.3 02/27/2019 1043   BILITOT 0.44 10/06/2017 1052       RADIOGRAPHIC STUDIES: No results found.  ASSESSMENT AND PLAN:  This is a very pleasant 69 years old white male with stage IV non-small cell lung cancer, squamous cell carcinoma presented with multiple bilateral pulmonary nodules. The patient  was treated with systemic chemotherapy with carboplatin and paclitaxel status post 6 cycles. He tolerated the last cycle of his treatment well except for the persistent peripheral neuropathy. The patient has been in observation for the last 3 months. He had a repeat CT scan of the chest, abdomen and pelvis performed recently.  Has a scan showed mild interval enlargement of left upper lobe as well as right upper lobe pulmonary nodules concerning for disease progression. The patient was started on treatment with immunotherapy with Nivolumab 480 mg IV every 4 weeks status post 15 cycles.   The patient has been tolerating this treatment well with no concerning adverse effects. He had repeat CT scan of the chest, abdomen and pelvis performed earlier today.  The final report is still pending. I personally and independently reviewed the scan images with comparison to the previous scan and I did not see any concerning findings for disease progression but I will wait for the final report  for confirmation. I recommended for the patient to proceed with cycle #16 today as scheduled. For the peripheral neuropathy he will continue his current treatment with Neurontin 600 mg p.o. 3 times daily. I will see him back for follow-up visit in 4 weeks for evaluation before the next cycle of his treatment. He was advised to call immediately if he has any concerning symptoms in the interval. The patient voices understanding of current disease status and treatment options and is in agreement with the current care plan. All questions were answered. The patient knows to call the clinic with any problems, questions or concerns. We can certainly see the patient much sooner if necessary.  Disclaimer: This note was dictated with voice recognition software. Similar sounding words can inadvertently be transcribed and may not be corrected upon review.

## 2019-02-27 NOTE — Patient Instructions (Signed)
[place] Discharge Instructions for Patients Receiving Chemotherapy  Today you received the following immunotherapy agent:  Opdivo  To help prevent nausea and vomiting after your treatment, we encourage you to take your nausea medication as prescribed.   If you develop nausea and vomiting that is not controlled by your nausea medication, call the clinic.   BELOW ARE SYMPTOMS THAT SHOULD BE REPORTED IMMEDIATELY:  *FEVER GREATER THAN 100.5 F  *CHILLS WITH OR WITHOUT FEVER  NAUSEA AND VOMITING THAT IS NOT CONTROLLED WITH YOUR NAUSEA MEDICATION  *UNUSUAL SHORTNESS OF BREATH  *UNUSUAL BRUISING OR BLEEDING  TENDERNESS IN MOUTH AND THROAT WITH OR WITHOUT PRESENCE OF ULCERS  *URINARY PROBLEMS  *BOWEL PROBLEMS  UNUSUAL RASH Items with * indicate a potential emergency and should be followed up as soon as possible.  Feel free to call the clinic should you have any questions or concerns. The clinic phone number is [phone].  Please show the [place] at check-in to the Emergency Department and triage nurse.

## 2019-02-28 ENCOUNTER — Telehealth: Payer: Self-pay | Admitting: Internal Medicine

## 2019-02-28 NOTE — Telephone Encounter (Signed)
Scheduled appt per 5/12 los - pt to get an updated schedule next visit.

## 2019-03-01 ENCOUNTER — Ambulatory Visit: Payer: Medicare Other

## 2019-03-01 ENCOUNTER — Other Ambulatory Visit: Payer: Medicare Other

## 2019-03-01 ENCOUNTER — Ambulatory Visit: Payer: Medicare Other | Admitting: Internal Medicine

## 2019-03-01 ENCOUNTER — Telehealth: Payer: Self-pay | Admitting: Medical Oncology

## 2019-03-01 NOTE — Telephone Encounter (Signed)
Dental clearance needed for  for "dental treatment". LVM to call back . Per Julien Nordmann, he can have dental cleaning -pt on nivolumab.

## 2019-03-09 ENCOUNTER — Encounter (HOSPITAL_COMMUNITY): Payer: Medicare Other

## 2019-03-09 ENCOUNTER — Ambulatory Visit: Payer: Medicare Other | Admitting: Family

## 2019-03-29 ENCOUNTER — Other Ambulatory Visit: Payer: Self-pay

## 2019-03-29 ENCOUNTER — Encounter: Payer: Self-pay | Admitting: Internal Medicine

## 2019-03-29 ENCOUNTER — Inpatient Hospital Stay: Payer: Medicare Other

## 2019-03-29 ENCOUNTER — Inpatient Hospital Stay (HOSPITAL_BASED_OUTPATIENT_CLINIC_OR_DEPARTMENT_OTHER): Payer: Medicare Other | Admitting: Internal Medicine

## 2019-03-29 ENCOUNTER — Inpatient Hospital Stay: Payer: Medicare Other | Attending: Internal Medicine

## 2019-03-29 VITALS — BP 106/84 | HR 78 | Temp 97.9°F | Resp 18 | Ht 73.0 in | Wt 198.0 lb

## 2019-03-29 DIAGNOSIS — G629 Polyneuropathy, unspecified: Secondary | ICD-10-CM

## 2019-03-29 DIAGNOSIS — C7800 Secondary malignant neoplasm of unspecified lung: Secondary | ICD-10-CM

## 2019-03-29 DIAGNOSIS — Z5112 Encounter for antineoplastic immunotherapy: Secondary | ICD-10-CM | POA: Diagnosis present

## 2019-03-29 DIAGNOSIS — C3411 Malignant neoplasm of upper lobe, right bronchus or lung: Secondary | ICD-10-CM | POA: Insufficient documentation

## 2019-03-29 DIAGNOSIS — C3412 Malignant neoplasm of upper lobe, left bronchus or lung: Secondary | ICD-10-CM | POA: Diagnosis present

## 2019-03-29 DIAGNOSIS — I10 Essential (primary) hypertension: Secondary | ICD-10-CM

## 2019-03-29 DIAGNOSIS — Z79899 Other long term (current) drug therapy: Secondary | ICD-10-CM | POA: Diagnosis not present

## 2019-03-29 DIAGNOSIS — R5383 Other fatigue: Secondary | ICD-10-CM

## 2019-03-29 DIAGNOSIS — Z5111 Encounter for antineoplastic chemotherapy: Secondary | ICD-10-CM

## 2019-03-29 LAB — CBC WITH DIFFERENTIAL (CANCER CENTER ONLY)
Abs Immature Granulocytes: 0.01 10*3/uL (ref 0.00–0.07)
Basophils Absolute: 0.1 10*3/uL (ref 0.0–0.1)
Basophils Relative: 1 %
Eosinophils Absolute: 0.1 10*3/uL (ref 0.0–0.5)
Eosinophils Relative: 1 %
HCT: 44.3 % (ref 39.0–52.0)
Hemoglobin: 14.3 g/dL (ref 13.0–17.0)
Immature Granulocytes: 0 %
Lymphocytes Relative: 31 %
Lymphs Abs: 3 10*3/uL (ref 0.7–4.0)
MCH: 29.2 pg (ref 26.0–34.0)
MCHC: 32.3 g/dL (ref 30.0–36.0)
MCV: 90.6 fL (ref 80.0–100.0)
Monocytes Absolute: 0.8 10*3/uL (ref 0.1–1.0)
Monocytes Relative: 8 %
Neutro Abs: 5.5 10*3/uL (ref 1.7–7.7)
Neutrophils Relative %: 59 %
Platelet Count: 255 10*3/uL (ref 150–400)
RBC: 4.89 MIL/uL (ref 4.22–5.81)
RDW: 13.6 % (ref 11.5–15.5)
WBC Count: 9.5 10*3/uL (ref 4.0–10.5)
nRBC: 0 % (ref 0.0–0.2)

## 2019-03-29 LAB — CMP (CANCER CENTER ONLY)
ALT: 10 U/L (ref 0–44)
AST: 12 U/L — ABNORMAL LOW (ref 15–41)
Albumin: 3.9 g/dL (ref 3.5–5.0)
Alkaline Phosphatase: 68 U/L (ref 38–126)
Anion gap: 11 (ref 5–15)
BUN: 12 mg/dL (ref 8–23)
CO2: 22 mmol/L (ref 22–32)
Calcium: 9 mg/dL (ref 8.9–10.3)
Chloride: 104 mmol/L (ref 98–111)
Creatinine: 1.01 mg/dL (ref 0.61–1.24)
GFR, Est AFR Am: >60 mL/min (ref >60)
GFR, Estimated: >60 mL/min (ref >60)
Glucose, Bld: 101 mg/dL — ABNORMAL HIGH (ref 70–99)
Potassium: 3.8 mmol/L (ref 3.5–5.1)
Sodium: 137 mmol/L (ref 135–145)
Total Bilirubin: 0.5 mg/dL (ref 0.3–1.2)
Total Protein: 7.8 g/dL (ref 6.5–8.1)

## 2019-03-29 LAB — TSH: TSH: 2.674 u[IU]/mL (ref 0.320–4.118)

## 2019-03-29 MED ORDER — SODIUM CHLORIDE 0.9 % IV SOLN
480.0000 mg | Freq: Once | INTRAVENOUS | Status: AC
  Administered 2019-03-29: 480 mg via INTRAVENOUS
  Filled 2019-03-29: qty 48

## 2019-03-29 MED ORDER — SODIUM CHLORIDE 0.9 % IV SOLN
Freq: Once | INTRAVENOUS | Status: AC
  Administered 2019-03-29: 13:00:00 via INTRAVENOUS
  Filled 2019-03-29: qty 250

## 2019-03-29 NOTE — Progress Notes (Signed)
[place] [phone]   [phone]  OFFICE PROGRESS NOTE  System, Pcp Not In No address on file  DIAGNOSIS: Stage IV (T3, N0, M1 a) non-small cell lung cancer, squamous cell carcinoma presented with multiple bilateral pulmonary nodules diagnosed in April 2018.  PRIOR THERAPY:  Systemic chemotherapy with carboplatin for AUC of 5 and paclitaxel 175 MG/M2 every 3 weeks with Neulasta support. Status post 6 cycles.  CURRENT THERAPY: Second line treatment with immunotherapy was Nivolumab 480 mg IV every 4 weeks.  First cycle November 14, 2017.  Status post 16 cycles.  INTERVAL HISTORY: [name] [name] 68 y.o. male returns to the clinic today for follow-up visit.  The patient is feeling fine today with no concerning complaints except for few episodes of diarrhea at regular basis.  He was seen by gastroenterology in the past and no clear findings were found to explain his diarrhea this is most likely secondary to his treatment with immunotherapy.  He denied having any chest pain, shortness of breath, cough or hemoptysis.  He denied having any fever or chills.  He has no nausea, vomiting or constipation.  He denied having any significant weight loss.  The patient is here today for evaluation before starting cycle #17.  MEDICAL HISTORY: Past Medical History:  Diagnosis Date  . Depression 01/29/2017  . Dyspnea   . Encounter for antineoplastic chemotherapy 01/29/2017  . Goals of care, counseling/discussion 01/29/2017  . Hypertension   . Metastatic squamous cell carcinoma to lung, unspecified laterality ([place]) 01/28/2017  . Stroke [facility])     ALLERGIES:  has No Known Allergies.  MEDICATIONS:  Current Outpatient Medications  Medication Sig Dispense Refill  . acetaminophen (TYLENOL) 325 MG tablet You can take 2 every 4 hours as needed.  You can buy it over the counter DO NOT TAKE MORE THAN 4000 MG OF TYLENOL PER DAY.  IT CAN HARM YOUR LIVER.    Marland Kitchen amitriptyline  (ELAVIL) 100 MG tablet Take 1 tablet (100 mg total) by mouth at bedtime. 30 tablet 3  . aspirin EC 81 MG tablet Take 81 mg by mouth daily.    Marland Kitchen DIFICID 200 MG TABS tablet     . gabapentin (NEURONTIN) 300 MG capsule TAKE 2 CAPSULES BY MOUTH 3 TIMES DAILY 90 capsule 3  . hydrochlorothiazide (HYDRODIURIL) 25 MG tablet Restart this after you see your Primary care doctor.  Check your blood pressure at home and record.    Marland Kitchen lisinopril (PRINIVIL,ZESTRIL) 40 MG tablet Take 40 mg by mouth daily.    . metoprolol succinate (TOPROL-XL) 50 MG 24 hr tablet 50 mg.  0  . mirtazapine (REMERON) 30 MG tablet Take 1 tablet (30 mg total) by mouth at bedtime. 30 tablet 1  . nortriptyline (PAMELOR) 10 MG capsule TAKE 1 CAPSULE BY MOUTH TWICE DAILY(1 WITH EVENING MEAL AND 1 AT BEDTIME) TO SLOW OVERACTIVE BOWEL MUSCLES FOR 30 DAYS    . pantoprazole (PROTONIX) 40 MG tablet Take 1 tablet (40 mg total) by mouth 2 (two) times daily. 60 tablet 1  . simvastatin (ZOCOR) 20 MG tablet Take 20 mg by mouth daily.    . simvastatin (ZOCOR) 5 MG tablet Take 1 tablet (5 mg total) by mouth daily. 5 tablet 3  . temazepam (RESTORIL) 30 MG capsule TAKE 1 CAPSULE BY MOUTH ONCE DAILY AT BEDTIME AS NEEDED FOR SLEEP 30 capsule 0  . temazepam (RESTORIL) 30 MG capsule Take one tablet qhs prn sleep 30 capsule  1   No current facility-administered medications for this visit.     SURGICAL HISTORY:  Past Surgical History:  Procedure Laterality Date  . BACK SURGERY     cyst removal  . COLONOSCOPY    . LAPAROTOMY N/A 02/25/2017   Procedure: EXPLORATORY LAPAROTOMY MODIFIED GRAHAM'S PATCH;  Surgeon: Kinsinger, Arta Bruce, MD;  Location: [facility];  Service: General;  Laterality: N/A;  . MINOR PLACEMENT OF FIDUCIAL N/A 01/19/2017   Procedure: MINOR PLACEMENT OF FIDUCIAL x 6;  Surgeon: Collene Gobble, MD;  Location: [facility];  Service: Thoracic;  Laterality: N/A;  . VIDEO BRONCHOSCOPY WITH ENDOBRONCHIAL NAVIGATION N/A 01/19/2017   Procedure: VIDEO  BRONCHOSCOPY WITH ENDOBRONCHIAL NAVIGATION;  Surgeon: Collene Gobble, MD;  Location: [facility];  Service: Thoracic;  Laterality: N/A;    REVIEW OF SYSTEMS:  A comprehensive review of systems was negative except for: Gastrointestinal: positive for diarrhea   PHYSICAL EXAMINATION: General appearance: alert, cooperative and no distress Head: Normocephalic, without obvious abnormality, atraumatic Neck: no adenopathy, no JVD, supple, symmetrical, trachea midline and thyroid not enlarged, symmetric, no tenderness/mass/nodules Lymph nodes: Cervical, supraclavicular, and axillary nodes normal. Resp: clear to auscultation bilaterally Back: symmetric, no curvature. ROM normal. No CVA tenderness. Cardio: regular rate and rhythm, S1, S2 normal, no murmur, click, rub or gallop GI: soft, non-tender; bowel sounds normal; no masses,  no organomegaly Extremities: extremities normal, atraumatic, no cyanosis or edema  ECOG PERFORMANCE STATUS: 1 - Symptomatic but completely ambulatory   Blood pressure 106/84, pulse 78, temperature 97.9 F (36.6 C), temperature source Oral, resp. rate 18, height 6\' 1"  (1.854 m), weight 198 lb (89.8 kg), SpO2 97 %.  LABORATORY DATA: Lab Results  Component Value Date   WBC 9.5 03/29/2019   HGB 14.3 03/29/2019   HCT 44.3 03/29/2019   MCV 90.6 03/29/2019   PLT 255 03/29/2019      Chemistry      Component Value Date/Time   NA 137 03/29/2019 1104   NA 139 10/06/2017 1052   K 3.8 03/29/2019 1104   K 4.2 10/06/2017 1052   CL 104 03/29/2019 1104   CO2 22 03/29/2019 1104   CO2 25 10/06/2017 1052   BUN 12 03/29/2019 1104   BUN 13.2 10/06/2017 1052   CREATININE 1.01 03/29/2019 1104   CREATININE 0.9 10/06/2017 1052      Component Value Date/Time   CALCIUM 9.0 03/29/2019 1104   CALCIUM 9.5 10/06/2017 1052   ALKPHOS 68 03/29/2019 1104   ALKPHOS 68 10/06/2017 1052   AST 12 (L) 03/29/2019 1104   AST 14 10/06/2017 1052   ALT 10 03/29/2019 1104   ALT 13 10/06/2017 1052    BILITOT 0.5 03/29/2019 1104   BILITOT 0.44 10/06/2017 1052       RADIOGRAPHIC STUDIES: No results found.  ASSESSMENT AND PLAN:  This is a very pleasant 68 years old white male with stage IV non-small cell lung cancer, squamous cell carcinoma presented with multiple bilateral pulmonary nodules. The patient was treated with systemic chemotherapy with carboplatin and paclitaxel status post 6 cycles. He tolerated the last cycle of his treatment well except for the persistent peripheral neuropathy. The patient has been in observation for the last 3 months. He had a repeat CT scan of the chest, abdomen and pelvis performed recently.  Has a scan showed mild interval enlargement of left upper lobe as well as right upper lobe pulmonary nodules concerning for disease progression. The patient was started on treatment with immunotherapy with Nivolumab 480  mg IV every 4 weeks status post 16 cycles.   The patient continues to tolerate his treatment well except for few episodes of diarrhea. I recommended for him to proceed with his treatment and he will start cycle #17 today. For the peripheral neuropathy he will continue his current treatment with Neurontin 600 mg p.o. 3 times daily. I will see him back for follow-up visit in 4 weeks for evaluation before starting cycle #18. He was advised to call immediately if he has any concerning symptoms in the interval. The patient voices understanding of current disease status and treatment options and is in agreement with the current care plan. All questions were answered. The patient knows to call the clinic with any problems, questions or concerns. We can certainly see the patient much sooner if necessary.  Disclaimer: This note was dictated with voice recognition software. Similar sounding words can inadvertently be transcribed and may not be corrected upon review.

## 2019-03-29 NOTE — Patient Instructions (Signed)
[facility] Discharge Instructions for Patients Receiving Chemotherapy  Today you received the following chemotherapy agents:  Opdivo (nivolumab)  To help prevent nausea and vomiting after your treatment, we encourage you to take your nausea medication as prescribed.   If you develop nausea and vomiting that is not controlled by your nausea medication, call the clinic.   BELOW ARE SYMPTOMS THAT SHOULD BE REPORTED IMMEDIATELY:  *FEVER GREATER THAN 100.5 F  *CHILLS WITH OR WITHOUT FEVER  NAUSEA AND VOMITING THAT IS NOT CONTROLLED WITH YOUR NAUSEA MEDICATION  *UNUSUAL SHORTNESS OF BREATH  *UNUSUAL BRUISING OR BLEEDING  TENDERNESS IN MOUTH AND THROAT WITH OR WITHOUT PRESENCE OF ULCERS  *URINARY PROBLEMS  *BOWEL PROBLEMS  UNUSUAL RASH Items with * indicate a potential emergency and should be followed up as soon as possible.  Feel free to call the clinic should you have any questions or concerns. The clinic phone number is [phone].  Please show the [facility] at check-in to the Emergency Department and triage nurse.

## 2019-03-30 ENCOUNTER — Telehealth: Payer: Self-pay | Admitting: Medical Oncology

## 2019-03-30 NOTE — Telephone Encounter (Signed)
PN- "Can I try a TENS unit for my neuropathy?".  Gabapentin-  -[name] [name] confirmed that [name] [name] is only  taking 600 mg bid ( not 600 mg TID). [name] [name] confirmed the tablets are 300 mg tablet but the prescriber is [name] [name].  Pharmacy confirmed they have refilled it twice from [facility] original Rx. in Feb .  They also have rx from [place] ( 01/29/19)  on hold but will fill that one today. LVM for [name] [name] to  pick up rx at [facility] that was prescribed by  [name] [name]. If taking the increased doses of Gabapentin does not help his neuropathy in the next 2 weeks to call back .

## 2019-04-05 ENCOUNTER — Ambulatory Visit: Payer: Medicare Other | Attending: Radiation Oncology | Admitting: Radiation Oncology

## 2019-04-17 ENCOUNTER — Other Ambulatory Visit: Payer: Self-pay | Admitting: Internal Medicine

## 2019-04-17 ENCOUNTER — Other Ambulatory Visit: Payer: Self-pay | Admitting: *Deleted

## 2019-04-17 DIAGNOSIS — C7800 Secondary malignant neoplasm of unspecified lung: Secondary | ICD-10-CM

## 2019-04-17 DIAGNOSIS — G4700 Insomnia, unspecified: Secondary | ICD-10-CM

## 2019-04-17 MED ORDER — TEMAZEPAM 30 MG PO CAPS: ORAL_CAPSULE | ORAL | 1 refills | Status: DC

## 2019-04-23 ENCOUNTER — Telehealth: Payer: Self-pay | Admitting: Medical Oncology

## 2019-04-23 NOTE — Telephone Encounter (Signed)
digestive issue- Stated he has "Colitis" - Saw Dr Rosezella [place] in [place] ( GI) . He prescribed  Mescalamine 2 caps in am ,2 in pm Diarrhea has decreased from 4-5 x day to 1-2 /day on this med. He has f/u in 6 weeks.

## 2019-04-26 ENCOUNTER — Inpatient Hospital Stay: Payer: Medicare Other | Admitting: Physician Assistant

## 2019-04-26 ENCOUNTER — Inpatient Hospital Stay: Payer: Medicare Other

## 2019-04-26 ENCOUNTER — Telehealth: Payer: Self-pay | Admitting: Internal Medicine

## 2019-04-26 ENCOUNTER — Telehealth: Payer: Self-pay | Admitting: Medical Oncology

## 2019-04-26 NOTE — Telephone Encounter (Signed)
Scheduled appt per 7/09 sch message - pt aware of appt date and time .

## 2019-04-26 NOTE — Telephone Encounter (Signed)
Cancelled appt today due to digestive issues and trying to get diarrhea under control.

## 2019-05-03 ENCOUNTER — Other Ambulatory Visit: Payer: Self-pay | Admitting: *Deleted

## 2019-05-03 DIAGNOSIS — C7800 Secondary malignant neoplasm of unspecified lung: Secondary | ICD-10-CM

## 2019-05-03 MED ORDER — MIRTAZAPINE 30 MG PO TABS: 30.0000 mg | ORAL_TABLET | Freq: Every day | ORAL | 1 refills | Status: DC

## 2019-05-07 ENCOUNTER — Other Ambulatory Visit: Payer: Self-pay

## 2019-05-07 ENCOUNTER — Encounter (HOSPITAL_COMMUNITY): Payer: Self-pay

## 2019-05-07 ENCOUNTER — Inpatient Hospital Stay: Payer: Medicare Other

## 2019-05-07 ENCOUNTER — Emergency Department (HOSPITAL_COMMUNITY): Payer: Medicare Other

## 2019-05-07 ENCOUNTER — Inpatient Hospital Stay (HOSPITAL_COMMUNITY)
Admission: EM | Admit: 2019-05-07 | Discharge: 2019-05-08 | DRG: 872 | Disposition: A | Discharge status: Home / Self Care | Payer: Medicare Other | Source: Ambulatory Visit | Attending: Internal Medicine | Admitting: Internal Medicine

## 2019-05-07 ENCOUNTER — Inpatient Hospital Stay: Payer: Medicare Other | Attending: Internal Medicine | Admitting: Physician Assistant

## 2019-05-07 ENCOUNTER — Encounter: Payer: Self-pay | Admitting: Physician Assistant

## 2019-05-07 VITALS — BP 140/65 | HR 93 | Temp 99.1°F | Ht 73.0 in | Wt 185.6 lb

## 2019-05-07 DIAGNOSIS — A09 Infectious gastroenteritis and colitis, unspecified: Secondary | ICD-10-CM | POA: Diagnosis not present

## 2019-05-07 DIAGNOSIS — K521 Toxic gastroenteritis and colitis: Secondary | ICD-10-CM | POA: Diagnosis present

## 2019-05-07 DIAGNOSIS — K265 Chronic or unspecified duodenal ulcer with perforation: Secondary | ICD-10-CM | POA: Diagnosis present

## 2019-05-07 DIAGNOSIS — R197 Diarrhea, unspecified: Secondary | ICD-10-CM | POA: Diagnosis not present

## 2019-05-07 DIAGNOSIS — Z5112 Encounter for antineoplastic immunotherapy: Secondary | ICD-10-CM | POA: Diagnosis not present

## 2019-05-07 DIAGNOSIS — E875 Hyperkalemia: Secondary | ICD-10-CM | POA: Diagnosis present

## 2019-05-07 DIAGNOSIS — T502X5A Adverse effect of carbonic-anhydrase inhibitors, benzothiadiazides and other diuretics, initial encounter: Secondary | ICD-10-CM | POA: Diagnosis present

## 2019-05-07 DIAGNOSIS — Z7982 Long term (current) use of aspirin: Secondary | ICD-10-CM

## 2019-05-07 DIAGNOSIS — N179 Acute kidney failure, unspecified: Secondary | ICD-10-CM | POA: Diagnosis present

## 2019-05-07 DIAGNOSIS — F329 Major depressive disorder, single episode, unspecified: Secondary | ICD-10-CM | POA: Diagnosis present

## 2019-05-07 DIAGNOSIS — F17210 Nicotine dependence, cigarettes, uncomplicated: Secondary | ICD-10-CM | POA: Diagnosis present

## 2019-05-07 DIAGNOSIS — Z9221 Personal history of antineoplastic chemotherapy: Secondary | ICD-10-CM

## 2019-05-07 DIAGNOSIS — T451X5A Adverse effect of antineoplastic and immunosuppressive drugs, initial encounter: Secondary | ICD-10-CM | POA: Diagnosis present

## 2019-05-07 DIAGNOSIS — R5383 Other fatigue: Secondary | ICD-10-CM

## 2019-05-07 DIAGNOSIS — T464X5A Adverse effect of angiotensin-converting-enzyme inhibitors, initial encounter: Secondary | ICD-10-CM | POA: Diagnosis present

## 2019-05-07 DIAGNOSIS — Z8673 Personal history of transient ischemic attack (TIA), and cerebral infarction without residual deficits: Secondary | ICD-10-CM | POA: Diagnosis not present

## 2019-05-07 DIAGNOSIS — C7931 Secondary malignant neoplasm of brain: Secondary | ICD-10-CM | POA: Diagnosis present

## 2019-05-07 DIAGNOSIS — C7800 Secondary malignant neoplasm of unspecified lung: Secondary | ICD-10-CM | POA: Diagnosis not present

## 2019-05-07 DIAGNOSIS — E871 Hypo-osmolality and hyponatremia: Secondary | ICD-10-CM | POA: Diagnosis present

## 2019-05-07 DIAGNOSIS — A419 Sepsis, unspecified organism: Secondary | ICD-10-CM | POA: Diagnosis not present

## 2019-05-07 DIAGNOSIS — I10 Essential (primary) hypertension: Secondary | ICD-10-CM | POA: Diagnosis present

## 2019-05-07 DIAGNOSIS — Z79899 Other long term (current) drug therapy: Secondary | ICD-10-CM

## 2019-05-07 DIAGNOSIS — Y929 Unspecified place or not applicable: Secondary | ICD-10-CM | POA: Diagnosis not present

## 2019-05-07 DIAGNOSIS — C3490 Malignant neoplasm of unspecified part of unspecified bronchus or lung: Secondary | ICD-10-CM | POA: Diagnosis present

## 2019-05-07 DIAGNOSIS — Z8711 Personal history of peptic ulcer disease: Secondary | ICD-10-CM | POA: Diagnosis not present

## 2019-05-07 DIAGNOSIS — Z8619 Personal history of other infectious and parasitic diseases: Secondary | ICD-10-CM

## 2019-05-07 DIAGNOSIS — E860 Dehydration: Secondary | ICD-10-CM | POA: Diagnosis present

## 2019-05-07 DIAGNOSIS — Z20828 Contact with and (suspected) exposure to other viral communicable diseases: Secondary | ICD-10-CM | POA: Diagnosis present

## 2019-05-07 DIAGNOSIS — R6521 Severe sepsis with septic shock: Secondary | ICD-10-CM

## 2019-05-07 HISTORY — DX: Personal history of other infectious and parasitic diseases: Z86.19

## 2019-05-07 LAB — CMP (CANCER CENTER ONLY)
ALT: 6 U/L (ref 0–44)
AST: 9 U/L — ABNORMAL LOW (ref 15–41)
Albumin: 3.3 g/dL — ABNORMAL LOW (ref 3.5–5.0)
Alkaline Phosphatase: 93 U/L (ref 38–126)
Anion gap: 14 (ref 5–15)
BUN: 30 mg/dL — ABNORMAL HIGH (ref 8–23)
CO2: 17 mmol/L — ABNORMAL LOW (ref 22–32)
Calcium: 8.9 mg/dL (ref 8.9–10.3)
Chloride: 97 mmol/L — ABNORMAL LOW (ref 98–111)
Creatinine: 2.94 mg/dL — ABNORMAL HIGH (ref 0.61–1.24)
GFR, Est AFR Am: 25 mL/min — ABNORMAL LOW (ref >60)
GFR, Estimated: 21 mL/min — ABNORMAL LOW (ref >60)
Glucose, Bld: 142 mg/dL — ABNORMAL HIGH (ref 70–99)
Potassium: 6 mmol/L — ABNORMAL HIGH (ref 3.5–5.1)
Sodium: 128 mmol/L — ABNORMAL LOW (ref 135–145)
Total Bilirubin: 0.3 mg/dL (ref 0.3–1.2)
Total Protein: 7.3 g/dL (ref 6.5–8.1)

## 2019-05-07 LAB — CBC WITH DIFFERENTIAL/PLATELET
Abs Immature Granulocytes: 0.06 10*3/uL (ref 0.00–0.07)
Basophils Absolute: 0 10*3/uL (ref 0.0–0.1)
Basophils Relative: 0 %
Eosinophils Absolute: 0.2 10*3/uL (ref 0.0–0.5)
Eosinophils Relative: 2 %
HCT: 48 % (ref 39.0–52.0)
Hemoglobin: 15.1 g/dL (ref 13.0–17.0)
Immature Granulocytes: 1 %
Lymphocytes Relative: 33 %
Lymphs Abs: 4.3 10*3/uL — ABNORMAL HIGH (ref 0.7–4.0)
MCH: 28.9 pg (ref 26.0–34.0)
MCHC: 31.5 g/dL (ref 30.0–36.0)
MCV: 92 fL (ref 80.0–100.0)
Monocytes Absolute: 2.1 10*3/uL — ABNORMAL HIGH (ref 0.1–1.0)
Monocytes Relative: 16 %
Neutro Abs: 6.3 10*3/uL (ref 1.7–7.7)
Neutrophils Relative %: 48 %
Platelets: 456 10*3/uL — ABNORMAL HIGH (ref 150–400)
RBC: 5.22 MIL/uL (ref 4.22–5.81)
RDW: 13.9 % (ref 11.5–15.5)
WBC Morphology: INCREASED
WBC: 12.9 10*3/uL — ABNORMAL HIGH (ref 4.0–10.5)
nRBC: 0 % (ref 0.0–0.2)

## 2019-05-07 LAB — COMPREHENSIVE METABOLIC PANEL
ALT: 8 U/L (ref 0–44)
AST: 14 U/L — ABNORMAL LOW (ref 15–41)
Albumin: 3.6 g/dL (ref 3.5–5.0)
Alkaline Phosphatase: 84 U/L (ref 38–126)
Anion gap: 13 (ref 5–15)
BUN: 31 mg/dL — ABNORMAL HIGH (ref 8–23)
CO2: 21 mmol/L — ABNORMAL LOW (ref 22–32)
Calcium: 8.8 mg/dL — ABNORMAL LOW (ref 8.9–10.3)
Chloride: 93 mmol/L — ABNORMAL LOW (ref 98–111)
Creatinine, Ser: 3.47 mg/dL — ABNORMAL HIGH (ref 0.61–1.24)
GFR calc Af Amer: 20 mL/min — ABNORMAL LOW (ref >60)
GFR calc non Af Amer: 17 mL/min — ABNORMAL LOW (ref >60)
Glucose, Bld: 111 mg/dL — ABNORMAL HIGH (ref 70–99)
Potassium: 5.6 mmol/L — ABNORMAL HIGH (ref 3.5–5.1)
Sodium: 127 mmol/L — ABNORMAL LOW (ref 135–145)
Total Bilirubin: 0.6 mg/dL (ref 0.3–1.2)
Total Protein: 7.8 g/dL (ref 6.5–8.1)

## 2019-05-07 LAB — LACTIC ACID, PLASMA: Lactic Acid, Venous: 1.5 mmol/L (ref 0.5–1.9)

## 2019-05-07 LAB — PROTIME-INR
INR: 1 (ref 0.8–1.2)
Prothrombin Time: 12.6 seconds (ref 11.4–15.2)

## 2019-05-07 LAB — URINALYSIS, ROUTINE W REFLEX MICROSCOPIC
Bilirubin Urine: NEGATIVE
Glucose, UA: NEGATIVE mg/dL
Hgb urine dipstick: NEGATIVE
Ketones, ur: NEGATIVE mg/dL
Leukocytes,Ua: NEGATIVE
Nitrite: NEGATIVE
Protein, ur: NEGATIVE mg/dL
Specific Gravity, Urine: 1.008 (ref 1.005–1.030)
pH: 5 (ref 5.0–8.0)

## 2019-05-07 LAB — CBC
HCT: 39 % (ref 39.0–52.0)
Hemoglobin: 12.5 g/dL — ABNORMAL LOW (ref 13.0–17.0)
MCH: 29.7 pg (ref 26.0–34.0)
MCHC: 32.1 g/dL (ref 30.0–36.0)
MCV: 92.6 fL (ref 80.0–100.0)
Platelets: 361 10*3/uL (ref 150–400)
RBC: 4.21 MIL/uL — ABNORMAL LOW (ref 4.22–5.81)
RDW: 14 % (ref 11.5–15.5)
WBC: 8.3 10*3/uL (ref 4.0–10.5)
nRBC: 0 % (ref 0.0–0.2)

## 2019-05-07 LAB — CBC WITH DIFFERENTIAL (CANCER CENTER ONLY)
Abs Immature Granulocytes: 0.08 10*3/uL — ABNORMAL HIGH (ref 0.00–0.07)
Basophils Absolute: 0.1 10*3/uL (ref 0.0–0.1)
Basophils Relative: 1 %
Eosinophils Absolute: 0.1 10*3/uL (ref 0.0–0.5)
Eosinophils Relative: 1 %
HCT: 45.7 % (ref 39.0–52.0)
Hemoglobin: 15.2 g/dL (ref 13.0–17.0)
Immature Granulocytes: 1 %
Lymphocytes Relative: 34 %
Lymphs Abs: 3.3 10*3/uL (ref 0.7–4.0)
MCH: 30 pg (ref 26.0–34.0)
MCHC: 33.3 g/dL (ref 30.0–36.0)
MCV: 90.1 fL (ref 80.0–100.0)
Monocytes Absolute: 1.3 10*3/uL — ABNORMAL HIGH (ref 0.1–1.0)
Monocytes Relative: 14 %
Neutro Abs: 4.8 10*3/uL (ref 1.7–7.7)
Neutrophils Relative %: 49 %
Platelet Count: 422 10*3/uL — ABNORMAL HIGH (ref 150–400)
RBC: 5.07 MIL/uL (ref 4.22–5.81)
RDW: 13.9 % (ref 11.5–15.5)
WBC Count: 9.7 10*3/uL (ref 4.0–10.5)
nRBC: 0 % (ref 0.0–0.2)

## 2019-05-07 LAB — TSH: TSH: 3.825 u[IU]/mL (ref 0.320–4.118)

## 2019-05-07 LAB — CREATININE, SERUM
Creatinine, Ser: 1.99 mg/dL — ABNORMAL HIGH (ref 0.61–1.24)
GFR calc Af Amer: 39 mL/min — ABNORMAL LOW (ref >60)
GFR calc non Af Amer: 34 mL/min — ABNORMAL LOW (ref >60)

## 2019-05-07 LAB — APTT: aPTT: 32 seconds (ref 24–36)

## 2019-05-07 LAB — SARS CORONAVIRUS 2 BY RT PCR (HOSPITAL ORDER, PERFORMED IN ~~LOC~~ HOSPITAL LAB): SARS Coronavirus 2: NEGATIVE

## 2019-05-07 MED ORDER — SODIUM CHLORIDE 0.9 % IV SOLN
INTRAVENOUS | Status: DC
  Administered 2019-05-07: 22:00:00 via INTRAVENOUS

## 2019-05-07 MED ORDER — CHLORHEXIDINE GLUCONATE CLOTH 2 % EX PADS: 6.0000 | MEDICATED_PAD | Freq: Every day | CUTANEOUS | Status: DC

## 2019-05-07 MED ORDER — SODIUM CHLORIDE 0.9 % IV SOLN
2.0000 g | Freq: Once | INTRAVENOUS | Status: AC
  Administered 2019-05-07: 2 g via INTRAVENOUS
  Filled 2019-05-07: qty 2

## 2019-05-07 MED ORDER — PANTOPRAZOLE SODIUM 40 MG PO TBEC
40.0000 mg | DELAYED_RELEASE_TABLET | Freq: Every day | ORAL | Status: DC
  Administered 2019-05-08: 40 mg via ORAL
  Filled 2019-05-07: qty 1

## 2019-05-07 MED ORDER — VANCOMYCIN HCL 10 G IV SOLR
1500.0000 mg | Freq: Once | INTRAVENOUS | Status: AC
  Administered 2019-05-07: 1500 mg via INTRAVENOUS
  Filled 2019-05-07: qty 1500

## 2019-05-07 MED ORDER — VANCOMYCIN HCL IN DEXTROSE 1-5 GM/200ML-% IV SOLN: 1000.0000 mg | Freq: Once | INTRAVENOUS | Status: DC

## 2019-05-07 MED ORDER — SODIUM CHLORIDE 0.9 % IV BOLUS
1000.0000 mL | Freq: Once | INTRAVENOUS | Status: AC
  Administered 2019-05-07: 1000 mL via INTRAVENOUS

## 2019-05-07 MED ORDER — METRONIDAZOLE IN NACL 5-0.79 MG/ML-% IV SOLN
500.0000 mg | Freq: Once | INTRAVENOUS | Status: AC
  Administered 2019-05-07: 500 mg via INTRAVENOUS
  Filled 2019-05-07: qty 100

## 2019-05-07 MED ORDER — ACETAMINOPHEN 325 MG PO TABS: 650.0000 mg | ORAL_TABLET | Freq: Four times a day (QID) | ORAL | Status: DC | PRN

## 2019-05-07 MED ORDER — ENOXAPARIN SODIUM 30 MG/0.3ML ~~LOC~~ SOLN
30.0000 mg | Freq: Every day | SUBCUTANEOUS | Status: DC
  Administered 2019-05-07: 30 mg via SUBCUTANEOUS
  Filled 2019-05-07: qty 0.3

## 2019-05-07 MED ORDER — MIRTAZAPINE 15 MG PO TABS
30.0000 mg | ORAL_TABLET | Freq: Every day | ORAL | Status: DC
  Administered 2019-05-07: 30 mg via ORAL
  Filled 2019-05-07: qty 2

## 2019-05-07 MED ORDER — ONDANSETRON HCL 4 MG/2ML IJ SOLN: 4.0000 mg | Freq: Four times a day (QID) | INTRAMUSCULAR | Status: DC | PRN

## 2019-05-07 MED ORDER — GABAPENTIN 300 MG PO CAPS
600.0000 mg | ORAL_CAPSULE | Freq: Three times a day (TID) | ORAL | Status: DC
  Administered 2019-05-07 – 2019-05-08 (×2): 600 mg via ORAL
  Filled 2019-05-07 (×2): qty 2

## 2019-05-07 MED ORDER — MESALAMINE ER 0.375 G PO CP24: 1.5000 g | ORAL_CAPSULE | Freq: Every day | ORAL | Status: DC

## 2019-05-07 MED ORDER — ACETAMINOPHEN 650 MG RE SUPP: 650.0000 mg | Freq: Four times a day (QID) | RECTAL | Status: DC | PRN

## 2019-05-07 MED ORDER — NORTRIPTYLINE HCL 10 MG PO CAPS
10.0000 mg | ORAL_CAPSULE | Freq: Two times a day (BID) | ORAL | Status: DC
  Administered 2019-05-07 – 2019-05-08 (×2): 10 mg via ORAL
  Filled 2019-05-07 (×2): qty 1

## 2019-05-07 MED ORDER — SODIUM ZIRCONIUM CYCLOSILICATE 10 G PO PACK
10.0000 g | PACK | Freq: Once | ORAL | Status: AC
  Administered 2019-05-07: 10 g via ORAL
  Filled 2019-05-07: qty 1

## 2019-05-07 MED ORDER — ONDANSETRON HCL 4 MG PO TABS: 4.0000 mg | ORAL_TABLET | Freq: Four times a day (QID) | ORAL | Status: DC | PRN

## 2019-05-07 NOTE — ED Provider Notes (Signed)
[place] DEPT Provider Note   CSN: [id number] Arrival date & time: 05/07/19  1410    History   Chief Complaint Chief Complaint  Patient presents with  . Dehydration    HPI [name] [name] is a 68 y.o. male with history of stroke, lung cancer with brain metastasis currently undergoing immunotherapy who presents with hypotension and reported AKI and hyperkalemia from blood drawn earlier today.  Patient has had diarrhea for over a month usually about 6 times a day.  It is nonbloody.  Patient reports some pain in his left upper abdomen only when he coughs.  He denies any other abdominal pain.  He denies any fever, significant cough, shortness of breath, nausea, vomiting.  He has noticed no change to his urination.     HPI  Past Medical History:  Diagnosis Date  . Depression 01/29/2017  . Dyspnea   . Encounter for antineoplastic chemotherapy 01/29/2017  . Goals of care, counseling/discussion 01/29/2017  . Hypertension   . Metastatic squamous cell carcinoma to lung, unspecified laterality ([place]) 01/28/2017  . Stroke [facility])     Patient Active Problem List   Diagnosis Date Noted  . AKI (acute kidney injury) ([place]) 05/07/2019  . Hyperkalemia 05/07/2019  . Diarrhea 05/07/2019  . Sepsis ([place]) 05/07/2019  . Chemotherapy-induced neuropathy ([place]) 01/05/2019  . Encounter for antineoplastic immunotherapy 12/12/2017  . Peripheral neuropathy 07/05/2017  . Hypertension 05/24/2017  . Insomnia 05/24/2017  . Perforated duodenal ulcer ([place]) s/p exploratory laparotomy and placement of Graham patch 02/25/17 02/25/2017  . H/O exploratory laparotomy 02/25/2017  . Brain metastases ([place]) 02/15/2017  . Encounter for antineoplastic chemotherapy 01/29/2017  . Goals of care, counseling/discussion 01/29/2017  . Depression 01/29/2017  . Encounter for smoking cessation counseling 01/29/2017  . Metastatic squamous cell carcinoma to lung, unspecified laterality ([place]) 01/28/2017  .  Pulmonary nodules     Past Surgical History:  Procedure Laterality Date  . BACK SURGERY     cyst removal  . COLONOSCOPY    . LAPAROTOMY N/A 02/25/2017   Procedure: EXPLORATORY LAPAROTOMY MODIFIED GRAHAM'S PATCH;  Surgeon: Kinsinger, Arta Bruce, MD;  [facility];  Service: General;  Laterality: N/A;  . MINOR PLACEMENT OF FIDUCIAL N/A 01/19/2017   Procedure: MINOR PLACEMENT OF FIDUCIAL x 6;  Surgeon: Collene Gobble, MD;  Location: [facility];  Service: Thoracic;  Laterality: N/A;  . VIDEO BRONCHOSCOPY WITH ENDOBRONCHIAL NAVIGATION N/A 01/19/2017   Procedure: VIDEO BRONCHOSCOPY WITH ENDOBRONCHIAL NAVIGATION;  Surgeon: Collene Gobble, MD;  Location: [place] [place];  Service: Thoracic;  Laterality: N/A;        Home Medications    Prior to Admission medications   Medication Sig Start Date End Date Taking? Authorizing Provider  acetaminophen (TYLENOL) 325 MG tablet You can take 2 every 4 hours as needed.  You can buy it over the counter DO NOT TAKE MORE THAN 4000 MG OF TYLENOL PER DAY.  IT CAN HARM YOUR LIVER. Patient taking differently: Take 650 mg by mouth every 4 (four) hours as needed for mild pain or moderate pain. You can take 2 every 4 hours as needed.  You can buy it over the counter DO NOT TAKE MORE THAN 4000 MG OF TYLENOL PER DAY.  IT CAN HARM YOUR LIVER. 03/03/17  Yes Earnstine Regal, PA-C  aspirin EC 81 MG tablet Take 81 mg by mouth daily.   Yes [provider]  gabapentin (NEURONTIN) 300 MG capsule TAKE 2 CAPSULES BY MOUTH 3 TIMES DAILY 01/29/19  Yes Vaslow, Acey Lav, MD  hydrochlorothiazide (HYDRODIURIL) 25 MG tablet Restart this after you see your Primary care doctor.  Check your blood pressure at home and record. 03/03/17  Yes Earnstine Regal, PA-C  lisinopril (PRINIVIL,ZESTRIL) 40 MG tablet Take 40 mg by mouth daily.   Yes [provider]  mesalamine (APRISO) 0.375 g 24 hr capsule Take 4 capsules by mouth daily. 03/21/19  Yes Weeks, Landon E  metoprolol succinate  (TOPROL-XL) 50 MG 24 hr tablet Take 50 mg by mouth daily.  05/25/17  Yes [provider]  mirtazapine (REMERON) 30 MG tablet Take 1 tablet (30 mg total) by mouth at bedtime. 05/03/19  Yes [name], MD  nortriptyline (PAMELOR) 10 MG capsule Take 10 mg by mouth 2 (two) times daily.  11/18/18  Yes [provider]  pantoprazole (PROTONIX) 40 MG tablet Take 1 tablet (40 mg total) by mouth 2 (two) times daily. 05/12/17  Yes [name], MD  simvastatin (ZOCOR) 20 MG tablet Take 20 mg by mouth daily. 10/30/18  Yes [provider]  temazepam (RESTORIL) 30 MG capsule TAKE 1 CAPSULE BY MOUTH ONCE DAILY AT BEDTIME AS NEEDED FOR SLEEP Patient taking differently: Take 30 mg by mouth at bedtime.  04/18/19  Yes [name], MD  amitriptyline (ELAVIL) 100 MG tablet Take 1 tablet (100 mg total) by mouth at bedtime. Patient not taking: Reported on 05/07/2019 01/05/19   Ventura Sellers, MD  simvastatin (ZOCOR) 5 MG tablet Take 1 tablet (5 mg total) by mouth daily. Patient not taking: Reported on 05/07/2019 07/18/17   Serafina Mitchell, MD  temazepam (RESTORIL) 30 MG capsule TAKE 1 CAPSULE BY MOUTH ONCE DAILY AT BEDTIME AS NEEDED FOR SLEEP Patient not taking: Reported on 05/07/2019 02/13/19   [name], MD  temazepam (RESTORIL) 30 MG capsule Take one tablet qhs prn sleep Patient not taking: Reported on 05/07/2019 04/17/19   [name], MD    Family History Family History  Problem Relation Age of Onset  . Cancer Father        unsure of what type    Social History Social History   Tobacco Use  . Smoking status: Current Every Day Smoker    Packs/day: 1.00    Years: 45.00    Pack years: 45.00    Types: Cigarettes  . Smokeless tobacco: Never Used  . Tobacco comment: Less than 1 pk per day  Substance Use Topics  . Alcohol use: No  . Drug use: Yes    Types: Marijuana    Comment: daily     Allergies   Patient has no known allergies.   Review of Systems  Review of Systems  Constitutional: Negative for chills and fever.  HENT: Negative for facial swelling and sore throat.   Respiratory: Negative for cough and shortness of breath.   Cardiovascular: Negative for chest pain.  Gastrointestinal: Positive for diarrhea. Negative for abdominal pain, blood in stool, nausea and vomiting.  Genitourinary: Negative for dysuria.  Musculoskeletal: Negative for back pain.  Skin: Negative for rash and wound.  Neurological: Positive for light-headedness. Negative for headaches.  Psychiatric/Behavioral: The patient is not nervous/anxious.      Physical Exam Updated Vital Signs BP (!) 78/63   Pulse 79   Temp 97.9 F (36.6 C) (Oral)   Resp 14   Ht 5\' 8"  (1.727 m)   Wt 84 kg   SpO2 (!) 88%   BMI 28.16 kg/m   Physical Exam Vitals signs and nursing note  reviewed.  Constitutional:      General: He is not in acute distress.    Appearance: He is well-developed. He is not diaphoretic.  HENT:     Head: Normocephalic and atraumatic.     Mouth/Throat:     Pharynx: No oropharyngeal exudate.  Eyes:     General: No scleral icterus.       Right eye: No discharge.        Left eye: No discharge.     Conjunctiva/sclera: Conjunctivae normal.     Pupils: Pupils are equal, round, and reactive to light.  Neck:     Musculoskeletal: Normal range of motion and neck supple.     Thyroid: No thyromegaly.  Cardiovascular:     Rate and Rhythm: Normal rate and regular rhythm.     Heart sounds: Normal heart sounds. No murmur. No friction rub. No gallop.   Pulmonary:     Effort: Pulmonary effort is normal. No respiratory distress.     Breath sounds: Normal breath sounds. No stridor. No wheezing or rales.  Abdominal:     General: Bowel sounds are normal. There is no distension.     Palpations: Abdomen is soft.     Tenderness: There is no abdominal tenderness. There is no guarding or rebound.  Lymphadenopathy:     Cervical: No cervical adenopathy.  Skin:     General: Skin is warm and dry.     Coloration: Skin is not pale.     Findings: No rash.  Neurological:     Mental Status: He is alert.     Coordination: Coordination normal.      ED Treatments / Results  Labs (all labs ordered are listed, but only abnormal results are displayed) Labs Reviewed  COMPREHENSIVE METABOLIC PANEL - Abnormal; Notable for the following components:      Result Value   Sodium 127 (*)    Potassium 5.6 (*)    Chloride 93 (*)    CO2 21 (*)    Glucose, Bld 111 (*)    BUN 31 (*)    Creatinine, Ser 3.47 (*)    Calcium 8.8 (*)    AST 14 (*)    GFR calc non Af Amer 17 (*)    GFR calc Af Amer 20 (*)    All other components within normal limits  CBC WITH DIFFERENTIAL/PLATELET - Abnormal; Notable for the following components:   WBC 12.9 (*)    Platelets 456 (*)    All other components within normal limits  CULTURE, BLOOD (ROUTINE X 2)  CULTURE, BLOOD (ROUTINE X 2)  SARS CORONAVIRUS 2 (HOSPITAL ORDER, [facility])  URINE CULTURE  GASTROINTESTINAL PANEL BY PCR, STOOL (REPLACES STOOL CULTURE)  C DIFFICILE QUICK SCREEN W PCR REFLEX  LACTIC ACID, PLASMA  APTT  PROTIME-INR  URINALYSIS, ROUTINE W REFLEX MICROSCOPIC    EKG None  Radiology Dg Chest Port 1 View  Result Date: 05/07/2019 CLINICAL DATA:  Sepsis.  History of lung cancer. EXAM: PORTABLE CHEST 1 VIEW COMPARISON:  CT chest, abdomen, and pelvis dated Feb 27, 2019. Chest x-ray dated January 19, 2017. FINDINGS: The heart size and mediastinal contours are within normal limits. Atherosclerotic calcification of the aortic arch. Normal pulmonary vascularity. Emphysematous changes again noted. No focal consolidation, pleural effusion, or pneumothorax. No acute osseous abnormality. IMPRESSION: No active disease. Electronically Signed   By: Titus Dubin M.D.   On: 05/07/2019 15:50    Procedures .Critical Care Performed by: Eliezer Mccoy  M, PA-C Authorized by: Frederica Kuster,  PA-C   Critical care provider statement:    Critical care time (minutes):  45   Critical care was necessary to treat or prevent imminent or life-threatening deterioration of the following conditions:  Sepsis and renal failure   Critical care was time spent personally by me on the following activities:  Discussions with consultants, evaluation of patient's response to treatment, examination of patient, ordering and performing treatments and interventions, ordering and review of laboratory studies, ordering and review of radiographic studies, pulse oximetry, re-evaluation of patient's condition, obtaining history from patient or surrogate and review of old charts   I assumed direction of critical care for this patient from another provider in my specialty: no     (including critical care time)  Medications Ordered in ED Medications  vancomycin (VANCOCIN) 1,500 mg in sodium chloride 0.9 % 500 mL IVPB (1,500 mg Intravenous New Bag/Given 05/07/19 1616)  sodium chloride 0.9 % bolus 1,000 mL (0 mLs Intravenous Stopped 05/07/19 1617)  ceFEPIme (MAXIPIME) 2 g in sodium chloride 0.9 % 100 mL IVPB (0 g Intravenous Stopped 05/07/19 1617)  metroNIDAZOLE (FLAGYL) IVPB 500 mg (0 mg Intravenous Stopped 05/07/19 1617)  sodium chloride 0.9 % bolus 1,000 mL (0 mLs Intravenous Stopped 05/07/19 1718)  sodium zirconium cyclosilicate (LOKELMA) packet 10 g (10 g Oral Given 05/07/19 1619)  sodium chloride 0.9 % bolus 1,000 mL (1,000 mLs Intravenous New Bag/Given (Non-Interop) 05/07/19 1718)     Initial Impression / Assessment and Plan / ED Course  I have reviewed the triage vital signs and the nursing notes.  Pertinent labs & imaging results that were available during my care of the patient were reviewed by me and considered in my medical decision making (see chart for details).        Patient presenting with hypotension sent from doctor's office for AKI and hyperkalemia.  Patient has had diarrhea for over a month,  but denies any other new symptoms except for some pain in his left lower lung when he coughs.  Chest x-ray is clear.  CMP shows sodium 127, potassium 5.6, chloride 93, BUN 31, creatinine 3.47.  CBC shows leukocytosis of 12.9.  Lactic acid 1.5.  Blood cultures pending.  COVID-19 pending.  Sepsis protocol initiated.  Broad-spectrum antibiotics and fluids initiated with blood pressure improving appropriately.  Single dose of Lokelma ordered.  An oxygen saturation of 88% was documented, however I feel this is due to error as I went in the room and patient is saturating in the mid-high 90s with no shortness of breath.  I discussed patient case with Dr. Tana Coast with [facility] who accepts patient for admission.  GI panel and C. difficile testing added at her request.  I appreciate her assistance with the patient.  I discussed patient case with Dr. Venora Maples who guided the patient's management and agrees with plan.   Final Clinical Impressions(s) / ED Diagnoses   Final diagnoses:  Sepsis with acute renal failure and septic shock, due to unspecified organism, unspecified acute renal failure type ([place])  Diarrhea, unspecified type  Hyperkalemia    ED Discharge Orders    None       Caryl Ada 05/07/19 1718    Jola Schmidt, MD 05/07/19 1902

## 2019-05-07 NOTE — Progress Notes (Signed)
A consult was received from an ED physician for Vancomycin & Cefepime per pharmacy dosing.  The patient's profile has been reviewed for ht/wt/allergies/indication/available labs.    A one time order has been placed for Vancomycin 1500mg , Cefepime 2gm .  Further antibiotics/pharmacy consults should be ordered by admitting physician if indicated.                       Thank you, Minda Ditto 05/07/2019  3:42 PM

## 2019-05-07 NOTE — Progress Notes (Signed)
Notified provider of need to order fluid bolus. Eliezer Mccoy Pa was messaged through secure chat to ask for more fluids to be ordered since pt has had low bps. He has had more than 2 low sbp and needs full fluid resuscitation. Provider messaged back that she ordered and additional liter of fluid.  [facility] RN will continue to monitor sepsis

## 2019-05-07 NOTE — ED Notes (Signed)
ED TO INPATIENT HANDOFF REPORT  Name/Age/Gender [name] [name] 68 y.o. male  Code Status Code Status History    Date Active Date Inactive Code Status Order ID Comments User Context   02/25/2017 1836 03/03/2017 2038 Full Code [id number]  Kinsinger, Arta Bruce, MD Inpatient   Advance Care Planning Activity      Home/SNF/Other Home  Chief Complaint AKI  Level of Care/Admitting Diagnosis ED Disposition    ED Disposition Condition [name] [name]: [name] [name] [[id number]]  Level of Care: Stepdown [14]  Admit to SDU based on following criteria: Hemodynamic compromise or significant risk of instability:  Patient requiring short term acute titration and management of vasoactive drips, and invasive monitoring (i.e., CVP and Arterial line).  Covid Evaluation: Person Under Investigation (PUI)  Diagnosis: Sepsis [facility]) [[id number]]  Admitting Physician: [name] [name] [[id number]]  Attending Physician: [name] [name] [[id number]]  Estimated length of stay: past midnight tomorrow  Certification:: I certify this patient will need inpatient services for at least 2 midnights  PT Class (Do Not Modify): Inpatient [101]  PT Acc Code (Do Not Modify): Private [1]       Medical History Past Medical History:  Diagnosis Date  . Depression 01/29/2017  . Dyspnea   . Encounter for antineoplastic chemotherapy 01/29/2017  . Goals of care, counseling/discussion 01/29/2017  . Hypertension   . Metastatic squamous cell carcinoma to lung, unspecified laterality ([place]) 01/28/2017  . Stroke [facility])     Allergies No Known Allergies  IV Location/Drains/Wounds Patient Lines/Drains/Airways Status   Active Line/Drains/Airways    Name:   Placement date:   Placement time:   Site:   Days:   Peripheral IV 05/07/19 Left Antecubital   05/07/19    1447    Antecubital   less than 1   Negative Pressure Wound Therapy Abdomen Mid   03/01/17    1325    -   797   Incision (Closed) 02/25/17 Abdomen    02/25/17    1633     801          Labs/Imaging Results for orders placed or performed during the hospital encounter of 05/07/19 (from the past 48 hour(s))  Comprehensive metabolic panel     Status: Abnormal   Collection Time: 05/07/19  3:07 PM  Result Value Ref Range   Sodium 127 (L) 135 - 145 mmol/L   Potassium 5.6 (H) 3.5 - 5.1 mmol/L   Chloride 93 (L) 98 - 111 mmol/L   CO2 21 (L) 22 - 32 mmol/L   Glucose, Bld 111 (H) 70 - 99 mg/dL   BUN 31 (H) 8 - 23 mg/dL   Creatinine, Ser 3.47 (H) 0.61 - 1.24 mg/dL   Calcium 8.8 (L) 8.9 - 10.3 mg/dL   Total Protein 7.8 6.5 - 8.1 g/dL   Albumin 3.6 3.5 - 5.0 g/dL   AST 14 (L) 15 - 41 U/L   ALT 8 0 - 44 U/L   Alkaline Phosphatase 84 38 - 126 U/L   Total Bilirubin 0.6 0.3 - 1.2 mg/dL   GFR calc non Af Amer 17 (L) >60 mL/min   GFR calc Af Amer 20 (L) >60 mL/min   Anion gap 13 5 - 15    Comment: Performed at [facility], [place] [address] [place], [place] [address]  CBC WITH DIFFERENTIAL     Status: Abnormal   Collection Time: 05/07/19  3:07 PM  Result Value Ref Range  WBC 12.9 (H) 4.0 - 10.5 [name]/uL    Comment: WHITE COUNT CONFIRMED ON SMEAR   RBC 5.22 4.22 - 5.81 MIL/uL   Hemoglobin 15.1 13.0 - 17.0 g/dL   HCT 48.0 39.0 - 52.0 %   MCV 92.0 80.0 - 100.0 fL   MCH 28.9 26.0 - 34.0 pg   MCHC 31.5 30.0 - 36.0 g/dL   RDW 13.9 11.5 - 15.5 %   Platelets 456 (H) 150 - 400 [name]/uL   nRBC 0.0 0.0 - 0.2 %   Neutrophils Relative % 48 %   Neutro Abs 6.3 1.7 - 7.7 [name]/uL   Lymphocytes Relative 33 %   Lymphs Abs 4.3 (H) 0.7 - 4.0 [name]/uL   Monocytes Relative 16 %   Monocytes Absolute 2.1 (H) 0.1 - 1.0 [name]/uL   Eosinophils Relative 2 %   Eosinophils Absolute 0.2 0.0 - 0.5 [name]/uL   Basophils Relative 0 %   Basophils Absolute 0.0 0.0 - 0.1 [name]/uL   WBC Morphology INCREASED BANDS (>20% BANDS)     Comment: TOXIC GRANULATION   Immature Granulocytes 1 %   Abs Immature Granulocytes 0.06 0.00 - 0.07 [name]/uL    Comment: Performed at [facility], [place] [name], [place], [place] [address]  APTT     Status: None   Collection Time: 05/07/19  3:07 PM  Result Value Ref Range   aPTT 32 24 - 36 seconds    Comment: Performed at [facility], [place] [address] [place], [place] [address]  Protime-INR     Status: None   Collection Time: 05/07/19  3:07 PM  Result Value Ref Range   Prothrombin Time 12.6 11.4 - 15.2 seconds   INR 1.0 0.8 - 1.2    Comment: (NOTE) INR goal varies based on device and disease states. Performed at [facility], [place] [address], [place], [place] [address]   Blood Culture (routine x 2)     Status: None (Preliminary result)   Collection Time: 05/07/19  3:12 PM   Specimen: BLOOD  Result Value Ref Range   Specimen Description      BLOOD RIGHT ANTECUBITAL Performed at [facility], [place] [address], [place], [place] [address]    Special Requests      BOTTLES DRAWN AEROBIC AND ANAEROBIC Blood Culture adequate volume Performed at [place] [address], [place], [place] [address]    Culture PENDING    Report Status PENDING   SARS Coronavirus 2 (CEPHEID - Performed in [facility] hospital lab), Hosp Order     Status: None   Collection Time: 05/07/19  3:20 PM   Specimen: Nasopharyngeal Swab  Result Value Ref Range   SARS Coronavirus 2 NEGATIVE NEGATIVE    Comment: (NOTE) If result is NEGATIVE SARS-CoV-2 target nucleic acids are NOT DETECTED. The SARS-CoV-2 RNA is generally detectable in upper and lower  respiratory specimens during the acute phase of infection. The lowest  concentration of SARS-CoV-2 viral copies this assay can detect is 250  copies / mL. A negative result does not preclude SARS-CoV-2 infection  and should not be used as the sole basis for treatment or other  patient management decisions.  A negative result may occur with  improper specimen collection / handling, submission of specimen other  than  nasopharyngeal swab, presence of viral mutation(s) within the  areas targeted by this assay, and inadequate number of viral copies  (<250 copies / mL). A negative result must  be combined with clinical  observations, patient history, and epidemiological information. If result is POSITIVE SARS-CoV-2 target nucleic acids are DETECTED. The SARS-CoV-2 RNA is generally detectable in upper and lower  respiratory specimens dur ing the acute phase of infection.  Positive  results are indicative of active infection with SARS-CoV-2.  Clinical  correlation with patient history and other diagnostic information is  necessary to determine patient infection status.  Positive results do  not rule out bacterial infection or co-infection with other viruses. If result is PRESUMPTIVE POSTIVE SARS-CoV-2 nucleic acids MAY BE PRESENT.   A presumptive positive result was obtained on the submitted specimen  and confirmed on repeat testing.  While 2019 novel coronavirus  (SARS-CoV-2) nucleic acids may be present in the submitted sample  additional confirmatory testing may be necessary for epidemiological  and / or clinical management purposes  to differentiate between  SARS-CoV-2 and other Sarbecovirus currently known to infect humans.  If clinically indicated additional testing with an alternate test  methodology [phone]) is advised. The SARS-CoV-2 RNA is generally  detectable in upper and lower respiratory sp ecimens during the acute  phase of infection. The expected result is Negative. Fact Sheet for Patients:  StrictlyIdeas.no Fact Sheet for Healthcare Providers: BankingDealers.co.za This test is not yet approved or cleared by the [place] FDA and has been authorized for detection and/or diagnosis of SARS-CoV-2 by FDA under an Emergency Use Authorization (EUA).  This EUA will remain in effect (meaning this test can be used) for the duration of  the COVID-19 declaration under Section 564(b)(1) of the Act, 21 U.S.C. section 360bbb-3(b)(1), unless the authorization is terminated or revoked sooner. Performed at [facility], [place] [address], [place], [place] [address]   Lactic acid, plasma     Status: None   Collection Time: 05/07/19  3:20 PM  Result Value Ref Range   Lactic Acid, Venous 1.5 0.5 - 1.9 mmol/L    Comment: Performed at [facility], [place] [address], [place], [place] [address]  Blood Culture (routine x 2)     Status: None (Preliminary result)   Collection Time: 05/07/19  3:20 PM   Specimen: BLOOD  Result Value Ref Range   Specimen Description      BLOOD LEFT ANTECUBITAL Performed at [facility], [place] [address], [place], [place] [address]    Special Requests      BOTTLES DRAWN AEROBIC AND ANAEROBIC Blood Culture adequate volume Performed at [place] [address], [place], [place] [address]    Culture PENDING    Report Status PENDING   Urinalysis, Routine w reflex microscopic     Status: None   Collection Time: 05/07/19  7:44 PM  Result Value Ref Range   Color, Urine YELLOW YELLOW   APPearance CLEAR CLEAR   Specific Gravity, Urine 1.008 1.005 - 1.030   pH 5.0 5.0 - 8.0   Glucose, UA NEGATIVE NEGATIVE mg/dL   Hgb urine dipstick NEGATIVE NEGATIVE   Bilirubin Urine NEGATIVE NEGATIVE   Ketones, ur NEGATIVE NEGATIVE mg/dL   Protein, ur NEGATIVE NEGATIVE mg/dL   Nitrite NEGATIVE NEGATIVE   Leukocytes,Ua NEGATIVE NEGATIVE    Comment: Performed at [facility], [place] [address], [place], [place] [address]   Dg Chest Port 1 View  Result Date: 05/07/2019 CLINICAL DATA:  Sepsis.  History of lung cancer. EXAM: PORTABLE CHEST 1 VIEW COMPARISON:  CT chest, abdomen, and pelvis dated Feb 27, 2019. Chest x-ray dated  January 19, 2017. FINDINGS: The heart size and mediastinal contours are within normal limits. Atherosclerotic calcification of  the aortic arch. Normal pulmonary vascularity. Emphysematous changes again noted. No focal consolidation, pleural effusion, or pneumothorax. No acute osseous abnormality. IMPRESSION: No active disease. Electronically Signed   By: Titus Dubin M.D.   On: 05/07/2019 15:50    Pending Labs Unresulted Labs (From admission, onward)    Start     Ordered   05/07/19 1715  C difficile quick scan w PCR reflex  (C Difficile quick screen w PCR reflex panel)  Once, for 24 hours,   STAT     05/07/19 1714   05/07/19 1714  Gastrointestinal Panel by PCR , Stool  (Gastrointestinal Panel by PCR, Stool)  Once,   STAT     05/07/19 1714   05/07/19 1507  Urine culture  ONCE - STAT,   STAT     05/07/19 1507   Signed and Held  HIV antibody (Routine Testing)  Once,   R     Signed and Held   Signed and Held  Basic metabolic panel  Tomorrow morning,   R     Signed and Held   Signed and Held  CBC  Tomorrow morning,   R     Signed and Held   Signed and Held  CBC  (enoxaparin (LOVENOX)    CrCl < 30 ml/min)  Once,   R    Comments: Baseline for enoxaparin therapy IF NOT ALREADY DRAWN.  Notify MD if PLT < 100 [name].    Signed and Held   Signed and Held  Creatinine, serum  (enoxaparin (LOVENOX)    CrCl < 30 ml/min)  Once,   R    Comments: Baseline for enoxaparin therapy IF NOT ALREADY DRAWN.    Signed and Held   Signed and Held  Creatinine, serum  (enoxaparin (LOVENOX)    CrCl < 30 ml/min)  Weekly,   R    Comments: while on enoxaparin therapy.    Signed and Held          Vitals/Pain Today's Vitals   05/07/19 1830 05/07/19 1900 05/07/19 1939 05/07/19 2022  BP: 116/69 115/66 124/80   Pulse:   89 88  Resp: 17 14 19  (!) 25  Temp:   (!) 97.4 F (36.3 C) (!) 97.5 F (36.4 C)  TempSrc:   Oral Oral  SpO2:   97% 95%  Weight:      Height:      PainSc:   6  0-No pain    Isolation Precautions Droplet precaution  Medications Medications  sodium chloride 0.9 % bolus 1,000 mL (0 mLs Intravenous Stopped 05/07/19  1617)  ceFEPIme (MAXIPIME) 2 g in sodium chloride 0.9 % 100 mL IVPB (0 g Intravenous Stopped 05/07/19 1617)  metroNIDAZOLE (FLAGYL) IVPB 500 mg (0 mg Intravenous Stopped 05/07/19 1617)  vancomycin (VANCOCIN) 1,500 mg in sodium chloride 0.9 % 500 mL IVPB (0 mg Intravenous Stopped 05/07/19 1904)  sodium chloride 0.9 % bolus 1,000 mL (0 mLs Intravenous Stopped 05/07/19 1718)  sodium zirconium cyclosilicate (LOKELMA) packet 10 g (10 g Oral Given 05/07/19 1619)  sodium chloride 0.9 % bolus 1,000 mL (0 mLs Intravenous Stopped 05/07/19 1903)    Mobility walks

## 2019-05-07 NOTE — Progress Notes (Signed)
[name] [name]  System, Pcp Not In No address on file  DIAGNOSIS: Stage IV (T3, N0, M1 a) non-small cell lung cancer, squamous cell carcinoma presented with multiple bilateral pulmonary nodules diagnosed in April 2018.  PRIOR THERAPY: Systemic chemotherapy with carboplatin for AUC of 5 and paclitaxel 175 MG/M2 every 3 weeks with Neulasta support. Status post 6 cycles.  CURRENT THERAPY: Second line treatment with immunotherapy was Nivolumab 480 mg IV every 4 weeks.  First cycle November 14, 2017.  Status post 17 cycles.  INTERVAL HISTORY: [name] [name] 68 y.o. male returns to the clinic for a follow-up visit.  The patient is feeling unwell today he continues to endorse significant diarrhea. The patient states that he has had intermittent diarrhea for approximately 6 months or so.  Last month, the patient had an appointment with a gastroenterologist, Dr. Suella Grove, who is practicing in [place], [place] where the patient is from. The patient was diagnosed with colitis at this time and was prescribed mesalamine per a phone [name] on 04/23/2019 in [facility]. Since starting this medication, the patient states that it has not improved his diarrhea.  He continues to have approximately 6-7 watery/ loose stools a day with occasional incontinence and abdominal cramping.  He denies any nausea, vomiting, fevers, bloody stools, or mucus present in the stool.  The patient has not tried taking Imodium or any other interventions to assist with controlling his diarrhea. The patient states that he has not been drinking enough water as well. He denies any urinary complaints at this time. The patient also reports an associated 13 lb weight loss since last visit which he also attributes to "food not tasting as good" and light headedness with ambulation. He denies any headaches, falls, syncope, seizures, or dizziness. He states his vision may be blurrier than normal but he is unsure.   Regarding his  treatment with immunotherapy, besides the diarrhea, he denies any other adverse effects. He denies night sweats or chills. He reports his baseline cough and shortness of breath with exertion. He states he has an occasional LUQ/lower left anterior rib pain when taking a deep breath. He states this has been present approximately "a few days". He denies any other associated factors that exacerbate this pain including increased activity, eating, or bowel habits. He denies hemoptysis. He denies any rashes or skin changes. He is here today for evaluation before starting cycle #18.   MEDICAL HISTORY: Past Medical History:  Diagnosis Date  . Depression 01/29/2017  . Dyspnea   . Encounter for antineoplastic chemotherapy 01/29/2017  . Goals of care, counseling/discussion 01/29/2017  . Hypertension   . Metastatic squamous cell carcinoma to lung, unspecified laterality ([place]) 01/28/2017  . Stroke [facility])     ALLERGIES:  has No Known Allergies.  MEDICATIONS:  Current Outpatient Medications  Medication Sig Dispense Refill  . acetaminophen (TYLENOL) 325 MG tablet You can take 2 every 4 hours as needed.  You can buy it over the counter DO NOT TAKE MORE THAN 4000 MG OF TYLENOL PER DAY.  IT CAN HARM YOUR LIVER.    Marland Kitchen amitriptyline (ELAVIL) 100 MG tablet Take 1 tablet (100 mg total) by mouth at bedtime. 30 tablet 3  . aspirin EC 81 MG tablet Take 81 mg by mouth daily.    Marland Kitchen DIFICID 200 MG TABS tablet     . gabapentin (NEURONTIN) 300 MG capsule TAKE 2 CAPSULES BY MOUTH 3 TIMES DAILY 90 capsule 3  . hydrochlorothiazide (HYDRODIURIL) 25  MG tablet Restart this after you see your Primary care doctor.  Check your blood pressure at home and record.    Marland Kitchen HYDROcodone-acetaminophen (NORCO/VICODIN) 5-325 MG tablet Take 1 tablet by mouth every 6 (six) hours as needed. for pain    . lisinopril (PRINIVIL,ZESTRIL) 40 MG tablet Take 40 mg by mouth daily.    . mesalamine (APRISO) 0.375 g 24 hr capsule Take 4 capsules by mouth daily.     . metoprolol succinate (TOPROL-XL) 50 MG 24 hr tablet 50 mg.  0  . mirtazapine (REMERON) 30 MG tablet Take 1 tablet (30 mg total) by mouth at bedtime. 30 tablet 1  . nortriptyline (PAMELOR) 10 MG capsule TAKE 1 CAPSULE BY MOUTH TWICE DAILY(1 WITH EVENING MEAL AND 1 AT BEDTIME) TO SLOW OVERACTIVE BOWEL MUSCLES FOR 30 DAYS    . pantoprazole (PROTONIX) 40 MG tablet Take 1 tablet (40 mg total) by mouth 2 (two) times daily. 60 tablet 1  . simvastatin (ZOCOR) 20 MG tablet Take 20 mg by mouth daily.    . simvastatin (ZOCOR) 5 MG tablet Take 1 tablet (5 mg total) by mouth daily. 5 tablet 3  . temazepam (RESTORIL) 30 MG capsule TAKE 1 CAPSULE BY MOUTH ONCE DAILY AT BEDTIME AS NEEDED FOR SLEEP 30 capsule 0  . temazepam (RESTORIL) 30 MG capsule Take one tablet qhs prn sleep 30 capsule 1  . temazepam (RESTORIL) 30 MG capsule TAKE 1 CAPSULE BY MOUTH ONCE DAILY AT BEDTIME AS NEEDED FOR SLEEP 30 capsule 0   No current facility-administered medications for this visit.     SURGICAL HISTORY:  Past Surgical History:  Procedure Laterality Date  . BACK SURGERY     cyst removal  . COLONOSCOPY    . LAPAROTOMY N/A 02/25/2017   Procedure: EXPLORATORY LAPAROTOMY MODIFIED GRAHAM'S PATCH;  Surgeon: Kinsinger, Arta Bruce, MD;  Location: [facility];  Service: General;  Laterality: N/A;  . MINOR PLACEMENT OF FIDUCIAL N/A 01/19/2017   Procedure: MINOR PLACEMENT OF FIDUCIAL x 6;  Surgeon: Collene Gobble, MD;  Location: [facility];  Service: Thoracic;  Laterality: N/A;  . VIDEO BRONCHOSCOPY WITH ENDOBRONCHIAL NAVIGATION N/A 01/19/2017   Procedure: VIDEO BRONCHOSCOPY WITH ENDOBRONCHIAL NAVIGATION;  Surgeon: Collene Gobble, MD;  Location: [facility];  Service: Thoracic;  Laterality: N/A;    REVIEW OF SYSTEMS:   Review of Systems  Constitutional: Positive for appetite change, weight loss, and fatigue. Negative for chills, and fever.  HENT:  Negative for mouth sores, nosebleeds, sore throat and trouble swallowing.  Eyes: Negative for eye  problems and icterus.  Respiratory: Positive for baseline cough and shortness of breath with exertion. Negative for hemoptysis and wheezing.   Cardiovascular: Positive for pleuritic chest pain in left, inferior, anterior chest. Negative for leg swelling.  Gastrointestinal: Positive for diarrhea and abdominal cramping. Negative for constipation, hematochezia, melena, nausea and vomiting.  Genitourinary: Negative for bladder incontinence, difficulty urinating, dysuria, frequency and hematuria.    Musculoskeletal: Negative for back pain, gait problem, neck pain and neck stiffness.  Skin: Negative for itching and rash.  Neurological: Positive for light headedness. Negative for dizziness, extremity weakness, gait problem, headaches, and seizures.  Hematological: Negative for adenopathy. Does not bruise/bleed easily.  Psychiatric/Behavioral: Negative for confusion, depression and sleep disturbance. The patient is not nervous/anxious.     PHYSICAL EXAMINATION:  Blood pressure 140/65, pulse 93, temperature 99.1 F (37.3 C), temperature source Oral, height 6\' 1"  (1.854 m), weight 185 lb 9.6 oz (84.2 kg), SpO2 95 %.  ECOG PERFORMANCE STATUS: 1 - Symptomatic but completely ambulatory  Physical Exam  Constitutional: Oriented to person, place, and time and in no distress.  HENT:  Head: Normocephalic and atraumatic.  Mouth/Throat: No oropharyngeal exudate.  Eyes: Conjunctivae are normal. Right eye exhibits no discharge. Left eye exhibits no discharge. No scleral icterus.  Neck: Normal range of motion. Neck supple.  Cardiovascular: Normal rate, regular rhythm, normal heart sounds and intact distal pulses.  Pulmonary/Chest: Effort normal and breath sounds normal. No respiratory distress. No wheezes. No rales.  Abdominal: Soft. Bowel sounds are normal. Exhibits no distension and no mass. There is no tenderness.  Musculoskeletal: Normal range of motion. Exhibits no edema. No tenderness to palpation of the  anterior chest.  Lymphadenopathy:    No cervical adenopathy.  Neurological: Alert and oriented to person, place, and time. Exhibits normal muscle tone. Gait normal. Coordination normal.  Skin: Skin is warm and dry. No rash noted. Not diaphoretic. No erythema. No pallor.  Psychiatric: Mood, memory and judgment normal.  Vitals reviewed.  LABORATORY DATA: Lab Results  Component Value Date   WBC 9.7 05/07/2019   HGB 15.2 05/07/2019   HCT 45.7 05/07/2019   MCV 90.1 05/07/2019   PLT 422 (H) 05/07/2019      Chemistry      Component Value Date/Time   NA 128 (L) 05/07/2019 1217   NA 139 10/06/2017 1052   K 6.0 (H) 05/07/2019 1217   K 4.2 10/06/2017 1052   CL 97 (L) 05/07/2019 1217   CO2 17 (L) 05/07/2019 1217   CO2 25 10/06/2017 1052   BUN 30 (H) 05/07/2019 1217   BUN 13.2 10/06/2017 1052   CREATININE 2.94 (H) 05/07/2019 1217   CREATININE 0.9 10/06/2017 1052      Component Value Date/Time   CALCIUM 8.9 05/07/2019 1217   CALCIUM 9.5 10/06/2017 1052   ALKPHOS 93 05/07/2019 1217   ALKPHOS 68 10/06/2017 1052   AST 9 (L) 05/07/2019 1217   AST 14 10/06/2017 1052   ALT 6 05/07/2019 1217   ALT 13 10/06/2017 1052   BILITOT 0.3 05/07/2019 1217   BILITOT 0.44 10/06/2017 1052       RADIOGRAPHIC STUDIES:  No results found.   ASSESSMENT/PLAN:  This is a very pleasant 68 year old Caucasian male with stage IV non-small cell lung cancer, squamous cell carcinoma.  He presented with multiple bilateral pulmonary nodules.  He was diagnosed in April 2018. He underwent systemic chemotherapy with carboplatin and paclitaxel.  He is status post 6 cycles.  He tolerated it well except for the development of persistent peripheral neuropathy.  He was then on observation for 3 months. He showed signs of disease progression.   He is currently being treated with immunotherapy with nivolumab 480 mg IV every 4 weeks.  He is status post 17 cycles.  The patient was seen with Dr. Julien Nordmann today. Labs  were reviewed. The patient's labs demonstrate an acute kidney injury likely secondary to dehydration and diarrhea. The patient also has associated hyperkalemia.   The patient's diarrhea may be secondary to his immunotherapy. We recommend high dose steroids for management of his immunotherapy mediated diarrhea. The patient will be sent to the emergency department today for further management of his condition. He may benefit from an evaluation by GI and nephrology.    We will see the patient back in the clinic for a follow up visit once he is discharged from the hospital. He knows to call the clinic to arrange follow-up.  The patient was advised to call immediately if he has any concerning symptoms in the interval. The patient voices understanding of current disease status and treatment options and is in agreement with the current care plan. All questions were answered. The patient knows to call the clinic with any problems, questions or concerns. We can certainly see the patient much sooner if necessary  No orders of the defined types were placed in this encounter.    Shela Esses L Kwamane Whack, PA-C 05/07/19   ADDENDUM: Hematology/Oncology Attending: I had a face-to-face encounter with the patient today.  I recommended his care plan.  This is a very pleasant 68 years old white male with metastatic non-small cell lung cancer, squamous cell carcinoma status post several chemotherapy regimens and he is currently on immunotherapy with nivolumab every 4 weeks status post 17 cycles.  He has been tolerating this treatment well with no concerning adverse effect except for frequent episodes of diarrhea.  He was seen recently by gastroenterology and diagnosed with colitis.  He was given prescription for Imodium with no improvement.  The patient presented today for evaluation before his treatment and he was found to have acute renal failure with dehydration as well as hyperkalemia. We recommended for the  patient to go to the emergency department for further evaluation and probably admission for management of his acute renal failure.  His diarrhea could be also immunotherapy mediated colitis and the patient may need to start high-dose prednisone 1 mg/kg to be tapered slowly over several weeks.  He may also benefit from gastrointestinal evaluation during his admission. We will see him back for follow-up visit after discharge for evaluation and discussion of his treatment options.  Disclaimer: This [name] was dictated with voice recognition software. Similar sounding words can inadvertently be transcribed and may be missed upon review. Eilleen Kempf, MD 05/07/19

## 2019-05-07 NOTE — ED Triage Notes (Addendum)
Pt arrives from the cancer center. Acute kidney injury with hyperkalemia d/t dehydration and diarrhea. Pt is on immunotherapy, lung ca with mets to the brain. Pt admits to ETOH.

## 2019-05-07 NOTE — H&P (Signed)
History and Physical        Hospital Admission Note Date: 05/07/2019  Patient name: [name] [name] Medical record number: [id number] Date of birth: July 12, 1951 Age: 68 y.o. Gender: male  PCP: System, Pcp Not In    Patient coming from: [facility]  I have reviewed all records in the [facility].    Chief Complaint:  Diarrhea with abnormal labs  HPI: Patient is a 68 year old male with history of non-small cell lung cancer, metastatic, follows Dr. Julien Nordmann, hypertension, depression, neuropathy currently on immunotherapy presented for follow-up at [place] today.  Patient reported that he has ongoing diarrhea.  Patient had intermittent diarrhea for approximately 6 months.  For the last month he has been having 6-7 watery bowel movements every day, occasionally with abdominal cramping.  Patient also reports that he was on antibiotics for infection in his hand a week ago.  He was seen by gastroenterologist, Dr. weeks last month in [place] and was diagnosed with colitis at that time.  Patient was started on mesalamine, however patient states that it did not improve his diarrhea.  He has not tried taking Imodium or any other interventions.  Denies any hematochezia or melena.  Occasionally has nausea but no vomiting.  Patient reported about 13 pound weight loss, food not tasting good, poor appetite and generalized weakness. Denied any fevers, shortness of breath, occasionally has left anterior rib pain on taking a deep breath.  States he has been at home mostly with caretaker, has no positive COVID-19 contact.  COVID-19 test pending  ED work-up/course:  In ED, temp 99.1, heart rate 90, initial blood pressure 77/53, improved to 101/68 O2 sats 94% on room air Sodium 127, potassium 5.6, was initially 6.0, received Lokelma Chloride 93, bicarb 21, BUN 31, creatinine 3.4,  baseline 1.0 in 03/2019 WBC count 12.9 hemoglobin 15.1 Lactic acid 1.5  Review of Systems: Positives marked in 'bold' Constitutional: Denies fever, chills, diaphoresis, poor appetite and fatigue.  HEENT: Denies photophobia, eye pain, redness, hearing loss, ear pain, congestion, sore throat, rhinorrhea, sneezing, mouth sores, trouble swallowing, neck pain, neck stiffness and tinnitus.   Respiratory: Denies SOB, DOE, wheezing.  + Intermittent left rib pain, pleuritic Cardiovascular: Denies chest pain, palpitations and leg swelling.  Gastrointestinal: Please see HPI Genitourinary: Denies dysuria, urgency, frequency, hematuria, flank pain and difficulty urinating.  Musculoskeletal: Denies myalgias, back pain, joint swelling, arthralgias and gait problem.  Skin: Denies pallor, rash and wound.  Neurological: Denies dizziness, seizures, syncope, light-headedness, numbness and headaches. + Generalized weakness Hematological: Denies adenopathy. Easy bruising, personal or family bleeding history  Psychiatric/Behavioral: Denies suicidal ideation, mood changes, confusion, nervousness, sleep disturbance and agitation  Past Medical History: Past Medical History:  Diagnosis Date  . Depression 01/29/2017  . Dyspnea   . Encounter for antineoplastic chemotherapy 01/29/2017  . Goals of care, counseling/discussion 01/29/2017  . Hypertension   . Metastatic squamous cell carcinoma to lung, unspecified laterality ([place]) 01/28/2017  . Stroke [facility])     Past Surgical History:  Procedure Laterality Date  . BACK SURGERY     cyst removal  . COLONOSCOPY    . LAPAROTOMY N/A 02/25/2017   Procedure: EXPLORATORY  LAPAROTOMY MODIFIED GRAHAM'S PATCH;  Surgeon: Kinsinger, Arta Bruce, MD;  Location: [facility];  Service: General;  Laterality: N/A;  . MINOR PLACEMENT OF FIDUCIAL N/A 01/19/2017   Procedure: MINOR PLACEMENT OF FIDUCIAL x 6;  Surgeon: Collene Gobble, MD;  Location: [place];  Service: Thoracic;  Laterality: N/A;  .  VIDEO BRONCHOSCOPY WITH ENDOBRONCHIAL NAVIGATION N/A 01/19/2017   Procedure: VIDEO BRONCHOSCOPY WITH ENDOBRONCHIAL NAVIGATION;  Surgeon: Collene Gobble, MD;  Location: [facility] OR;  Service: Thoracic;  Laterality: N/A;    Medications: Prior to Admission medications   Medication Sig Start Date End Date Taking? Authorizing Provider  acetaminophen (TYLENOL) 325 MG tablet You can take 2 every 4 hours as needed.  You can buy it over the counter DO NOT TAKE MORE THAN 4000 MG OF TYLENOL PER DAY.  IT CAN HARM YOUR LIVER. Patient taking differently: Take 650 mg by mouth every 4 (four) hours as needed for mild pain or moderate pain. You can take 2 every 4 hours as needed.  You can buy it over the counter DO NOT TAKE MORE THAN 4000 MG OF TYLENOL PER DAY.  IT CAN HARM YOUR LIVER. 03/03/17  Yes Earnstine Regal, PA-C  aspirin EC 81 MG tablet Take 81 mg by mouth daily.   Yes [provider]  gabapentin (NEURONTIN) 300 MG capsule TAKE 2 CAPSULES BY MOUTH 3 TIMES DAILY 01/29/19  Yes Vaslow, Acey Lav, MD  hydrochlorothiazide (HYDRODIURIL) 25 MG tablet Restart this after you see your Primary care doctor.  Check your blood pressure at home and record. 03/03/17  Yes Earnstine Regal, PA-C  lisinopril (PRINIVIL,ZESTRIL) 40 MG tablet Take 40 mg by mouth daily.   Yes [provider]  mesalamine (APRISO) 0.375 g 24 hr capsule Take 4 capsules by mouth daily. 03/21/19  Yes Weeks, Landon E  metoprolol succinate (TOPROL-XL) 50 MG 24 hr tablet Take 50 mg by mouth daily.  05/25/17  Yes [provider]  mirtazapine (REMERON) 30 MG tablet Take 1 tablet (30 mg total) by mouth at bedtime. 05/03/19  Yes [name], MD  nortriptyline (PAMELOR) 10 MG capsule Take 10 mg by mouth 2 (two) times daily.  11/18/18  Yes [provider]  pantoprazole (PROTONIX) 40 MG tablet Take 1 tablet (40 mg total) by mouth 2 (two) times daily. 05/12/17  Yes [name], MD  simvastatin (ZOCOR) 20 MG tablet Take 20 mg by  mouth daily. 10/30/18  Yes [provider]  temazepam (RESTORIL) 30 MG capsule TAKE 1 CAPSULE BY MOUTH ONCE DAILY AT BEDTIME AS NEEDED FOR SLEEP Patient taking differently: Take 30 mg by mouth at bedtime.  04/18/19  Yes [name], MD  amitriptyline (ELAVIL) 100 MG tablet Take 1 tablet (100 mg total) by mouth at bedtime. Patient not taking: Reported on 05/07/2019 01/05/19   Ventura Sellers, MD  simvastatin (ZOCOR) 5 MG tablet Take 1 tablet (5 mg total) by mouth daily. Patient not taking: Reported on 05/07/2019 07/18/17   Serafina Mitchell, MD  temazepam (RESTORIL) 30 MG capsule TAKE 1 CAPSULE BY MOUTH ONCE DAILY AT BEDTIME AS NEEDED FOR SLEEP Patient not taking: Reported on 05/07/2019 02/13/19   [name], MD  temazepam (RESTORIL) 30 MG capsule Take one tablet qhs prn sleep Patient not taking: Reported on 05/07/2019 04/17/19   [name], MD    Allergies:  No Known Allergies  Social History:  reports that he has been smoking cigarettes. He has a 45.00 pack-year smoking history. He has never used  smokeless tobacco. He reports current drug use. Drug: Marijuana. He reports that he does not drink alcohol.  Family History: Family History  Problem Relation Age of Onset  . Cancer Father        unsure of what type    Physical Exam: Blood pressure 101/68, pulse 75, temperature 97.9 F (36.6 C), temperature source Oral, resp. rate 16, height '5\' 8"'$  (1.727 m), weight 84 kg, SpO2 99 %. General: Alert, awake, oriented x3, in no acute distress.  Dry mucosal membranes Eyes: pink conjunctiva,anicteric sclera, pupils equal and reactive to light and accomodation, HEENT: normocephalic, atraumatic, oropharynx clear Neck: supple, no masses or lymphadenopathy, no goiter, no bruits, no JVD CVS: Regular rate and rhythm, without murmurs, rubs or gallops. No lower extremity edema Resp : Clear to auscultation bilaterally, no wheezing, rales or rhonchi. GI : Soft, nontender, nondistended,  positive bowel sounds, no masses. No hepatomegaly. No hernia.  Musculoskeletal: No clubbing or cyanosis, positive pedal pulses. No contracture. ROM intact  Neuro: Grossly intact, no focal neurological deficits, strength 5/5 upper and lower extremities bilaterally Psych: alert and oriented x 3, normal mood and affect Skin: no rashes or lesions, warm and dry   LABS on Admission: I have personally reviewed all the labs and imagings below    Basic Metabolic Panel: Recent Labs  Lab 05/07/19 1217 05/07/19 1507  NA 128* 127*  K 6.0* 5.6*  CL 97* 93*  CO2 17* 21*  GLUCOSE 142* 111*  BUN 30* 31*  CREATININE 2.94* 3.47*  CALCIUM 8.9 8.8*   Liver Function Tests: Recent Labs  Lab 05/07/19 1217 05/07/19 1507  AST 9* 14*  ALT 6 8  ALKPHOS 93 84  BILITOT 0.3 0.6  PROT 7.3 7.8  ALBUMIN 3.3* 3.6   No results for input(s): LIPASE, AMYLASE in the last 168 hours. No results for input(s): AMMONIA in the last 168 hours. CBC: Recent Labs  Lab 05/07/19 1217 05/07/19 1507  WBC 9.7 12.9*  NEUTROABS 4.8 PENDING  HGB 15.2 15.1  HCT 45.7 48.0  MCV 90.1 92.0  PLT 422* 456*   Cardiac Enzymes: No results for input(s): CKTOTAL, CKMB, CKMBINDEX, TROPONINI in the last 168 hours. BNP: Invalid input(s): POCBNP CBG: No results for input(s): GLUCAP in the last 168 hours.  Radiological Exams on Admission:  Dg Chest Port 1 View  Result Date: 05/07/2019 CLINICAL DATA:  Sepsis.  History of lung cancer. EXAM: PORTABLE CHEST 1 VIEW COMPARISON:  CT chest, abdomen, and pelvis dated Feb 27, 2019. Chest x-ray dated January 19, 2017. FINDINGS: The heart size and mediastinal contours are within normal limits. Atherosclerotic calcification of the aortic arch. Normal pulmonary vascularity. Emphysematous changes again noted. No focal consolidation, pleural effusion, or pneumothorax. No acute osseous abnormality. IMPRESSION: No active disease. Electronically Signed   By: Titus Dubin M.D.   On: 05/07/2019 15:50       EKG: Independently reviewed.  Rate 88, sinus rhythm   Assessment/Plan Principal Problem:   Sepsis ([place]) of unclear etiology -Patient met sepsis criteria in ED, presented with hypotension, leukocytosis, tachycardia, diarrhea, acute kidney injury with metabolic acidosis, hyperkalemia -Follow blood cultures, COVID-19 test, C. difficile test PCR, GI pathogen panel -Patient placed on IV vancomycin, cefepime, Flagyl, will narrow antibiotics pending culture results, continue aggressive IV fluid hydration  Active Problems: Acute on chronic diarrhea -Patient has been having ongoing diarrhea for 6 months, has been following gastroenterologist at [place].  Per patient he was diagnosed with colitis and was placed on mesalamine -He recently  had antibiotic course a week ago, will rule out C. difficile, GI pathogen panel. -May need GI consult for further work-up pending above results    Metastatic squamous cell carcinoma to lung, unspecified laterality ([place]) with Brain metastases ([place]), stage IV, diagnosed in April 2018 -Underwent a systemic chemotherapy with carboplatin, paclitaxel, status post 6 cycles, currently being treated with immunotherapy every 4 weeks, status post 17 cycles. -Oncology following, was sent from [place] center today    Perforated duodenal ulcer ([place]) s/p exploratory laparotomy and placement of Graham patch 02/25/17 -Continue PPI, no acute issues    AKI (acute kidney injury) ([place]) with metabolic acidosis, anion gap 13 -Likely due to GI losses, dehydration, lisinopril and HCTZ. -Hold lisinopril, HCTZ, placed on aggressive IV fluid hydration.  Hyponatremia Sodium 127, likely due to dehydration, poor oral intake and ongoing diarrhea -Continue IV fluid hydration, follow bmet in a.m.    Hyperkalemia -Patient has received Lokelma dose 10 g x 1 in ED -Will recheck potassium  Hypotension -Likely due to #1, hold lisinopril, HCTZ, continue aggressive IV fluid  hydration   DVT prophylaxis: Lovenox  CODE STATUS: Full CODE STATUS  Consults called: Oncology  Family Communication: Admission, patients condition and plan of care including tests being ordered have been discussed with the patient who indicates understanding and agree with the plan and Code Status  Admission status:   Disposition plan: Further plan will depend as patient's clinical course evolves and further radiologic and laboratory data become available.    At the time of admission, it appears that the appropriate admission status for this patient is INPATIENT . This is judged to be reasonable and necessary in order to provide the required intensity of service to ensure the patient's safety given the presenting symptoms sepsis, acute on chronic diarrhea, profound dehydration with acute renal failure, hyperkalemia, physical exam findings, and initial radiographic and laboratory data in the context of their chronic comorbidities.  The medical decision making on this patient was of high complexity and the patient is at high risk for clinical deterioration, therefore this is a level 3 visit.   Time Spent on Admission: 70 mins    Charli Halle M.D. [facility] 05/07/2019, 5:46 PM

## 2019-05-08 LAB — CBC
HCT: 42.7 % (ref 39.0–52.0)
Hemoglobin: 13.2 g/dL (ref 13.0–17.0)
MCH: 29.1 pg (ref 26.0–34.0)
MCHC: 30.9 g/dL (ref 30.0–36.0)
MCV: 94.1 fL (ref 80.0–100.0)
Platelets: 375 10*3/uL (ref 150–400)
RBC: 4.54 MIL/uL (ref 4.22–5.81)
RDW: 14 % (ref 11.5–15.5)
WBC: 9.4 10*3/uL (ref 4.0–10.5)
nRBC: 0 % (ref 0.0–0.2)

## 2019-05-08 LAB — URINE CULTURE: Culture: NO GROWTH

## 2019-05-08 LAB — GASTROINTESTINAL PANEL BY PCR, STOOL (REPLACES STOOL CULTURE)

## 2019-05-08 LAB — C DIFFICILE QUICK SCREEN W PCR REFLEX
C Diff antigen: POSITIVE — AB
C Diff toxin: NEGATIVE

## 2019-05-08 LAB — BASIC METABOLIC PANEL
Anion gap: 10 (ref 5–15)
BUN: 23 mg/dL (ref 8–23)
CO2: 17 mmol/L — ABNORMAL LOW (ref 22–32)
Calcium: 8 mg/dL — ABNORMAL LOW (ref 8.9–10.3)
Chloride: 108 mmol/L (ref 98–111)
Creatinine, Ser: 1.58 mg/dL — ABNORMAL HIGH (ref 0.61–1.24)
GFR calc Af Amer: 52 mL/min — ABNORMAL LOW (ref >60)
GFR calc non Af Amer: 45 mL/min — ABNORMAL LOW (ref >60)
Glucose, Bld: 103 mg/dL — ABNORMAL HIGH (ref 70–99)
Potassium: 4.8 mmol/L (ref 3.5–5.1)
Sodium: 135 mmol/L (ref 135–145)

## 2019-05-08 LAB — HIV ANTIBODY (ROUTINE TESTING W REFLEX): HIV Screen 4th Generation wRfx: NONREACTIVE

## 2019-05-08 LAB — MRSA PCR SCREENING: MRSA by PCR: POSITIVE — AB

## 2019-05-08 LAB — CLOSTRIDIUM DIFFICILE BY PCR, REFLEXED: Toxigenic C. Difficile by PCR: POSITIVE — AB

## 2019-05-08 MED ORDER — LOPERAMIDE HCL 2 MG PO CAPS: 2.0000 mg | ORAL_CAPSULE | Freq: Two times a day (BID) | ORAL | 4 refills | Status: DC

## 2019-05-08 MED ORDER — METOPROLOL SUCCINATE ER 25 MG PO TB24: 25.0000 mg | ORAL_TABLET | Freq: Every day | ORAL | 4 refills | Status: DC

## 2019-05-08 MED ORDER — METRONIDAZOLE IN NACL 5-0.79 MG/ML-% IV SOLN
500.0000 mg | Freq: Three times a day (TID) | INTRAVENOUS | Status: DC
  Administered 2019-05-08: 500 mg via INTRAVENOUS
  Filled 2019-05-08: qty 100

## 2019-05-08 MED ORDER — LOPERAMIDE HCL 2 MG PO CAPS
4.0000 mg | ORAL_CAPSULE | Freq: Once | ORAL | Status: AC
  Administered 2019-05-08: 4 mg via ORAL
  Filled 2019-05-08: qty 2

## 2019-05-08 MED ORDER — SODIUM CHLORIDE 0.9 % IV SOLN
2.0000 g | Freq: Two times a day (BID) | INTRAVENOUS | Status: DC
  Administered 2019-05-08: 04:00:00 2 g via INTRAVENOUS
  Filled 2019-05-08: qty 2

## 2019-05-08 MED ORDER — ENSURE ENLIVE PO LIQD: 237.0000 mL | Freq: Two times a day (BID) | ORAL | Status: DC

## 2019-05-08 MED ORDER — VANCOMYCIN HCL IN DEXTROSE 1-5 GM/200ML-% IV SOLN: 1000.0000 mg | INTRAVENOUS | Status: DC

## 2019-05-08 NOTE — Progress Notes (Addendum)
Pharmacy Antibiotic Note  [name] [name] is a 68 y.o. male admitted on 05/07/2019 with sepsis.  Pharmacy has been consulted for cefepime and vancomycin dosing.  Plan: Cefepime 2 Gm IV q12h Vancomycin 1 Gm IV q24h for est AUC = 479 Goal AUC = 400-550 Flagyl MD F/u scr/culutures  Height: 5\' 8"  (172.7 cm) Weight: 189 lb 6 oz (85.9 kg) IBW/kg (Calculated) : 68.4  Temp (24hrs), Avg:97.9 F (36.6 C), Min:97.4 F (36.3 C), Max:99.1 F (37.3 C)  Recent Labs  Lab 05/07/19 1217 05/07/19 1507 05/07/19 1520 05/07/19 2147  WBC 9.7 12.9*  --  8.3  CREATININE 2.94* 3.47*  --  1.99*  LATICACIDVEN  --   --  1.5  --     Estimated Creatinine Clearance: 38.9 mL/min (A) (by C-G formula based on SCr of 1.99 mg/dL (H)).    No Known Allergies  Antimicrobials this admission: 7/20 cefepime >>  7/20 flagyl  >>  7/20 vancomycin >>  Dose adjustments this admission:   Microbiology results:  BCx:   UCx:    Sputum:    MRSA PCR:   Thank you for allowing pharmacy to be a part of this patient's care.  Dorrene German 05/08/2019 12:51 AM

## 2019-05-08 NOTE — Progress Notes (Signed)
Initial Nutrition Assessment  RD working remotely.   DOCUMENTATION CODES:   (unable to assess for malnutrition at this time.)  INTERVENTION:  - will forward note to [place] RD. - if patient does not d/c today, will order oral nutrition supplements.    NUTRITION DIAGNOSIS:   Increased nutrient needs related to acute illness, chronic illness, cancer and cancer related treatments as evidenced by estimated needs.  GOAL:   Patient will meet greater than or equal to 90% of their needs  MONITOR:   PO intake, Supplement acceptance, Labs, Weight trends, I & O's  REASON FOR ASSESSMENT:   Malnutrition Screening Tool  ASSESSMENT:   68 year old male with history of metastatic NSCLC follows Dr. Julien Nordmann, HTN, depression, and neuropathy. He is currently on immunotherapy and presented for follow-up at the [facility]. Patient reported intermittent diarrhea for approximately 6 months. For the last month he has been having 6-7 watery bowel movements every day, occasionally with abdominal cramping. Patient also reports that he was on antibiotics for infection in his hand a week ago. He was seen by Gastroenterologist last month in [place] and was diagnosed with colitis at that time and was started on mesalamine, however patient states that it did not improve his diarrhea. He has not tried taking Imodium or any other interventions. Occasionally has nausea but no vomiting. Patient reported 13 pound weight loss, food not tasting good, poor appetite and generalized weakness. He occasionally has left anterior rib pain on taking a deep breath.  No intakes documented. Unable to reach patient by phone at this time. Per H&P, as outlined above, patient has been having intermittent diarrhea x6 months which has been occurring daily for the past 1 month. He is on immunotherapy for cancer and was recently on abx for hand infection. He reported some occasional abdominal cramping and nausea, no vomiting PTA. He  had also reported poor appetite and that food does not taste good.  Per chart review, patient reported 13 lb weight loss, but unsure of time frame for this. Current weight is 189 lb and weight on 6/11 was 197 lb. This indicates 8 lb weight loss (4% body weight) in the past 5 weeks; not significant for time frame.   Discharge order for discharge to home is entered this AM. Discharge summary not yet entered. Ensure Enlive was ordered BID to start at 10 AM today.    Labs reviewed; creatinine: 1.58 mg/dl, Ca: 8 mg/dl, GFR: 45 ml/min.  Medications reviewed; 4 mg imodium x1 dose 7/21, 10 g lokelma x1 dose 7/20.  IVF; NS @ 125 ml/hr.      NUTRITION - FOCUSED PHYSICAL EXAM:  unable to complete at this time.   Diet Order:   Diet Order            Diet - low sodium heart healthy        Diet regular Room service appropriate? Yes; Fluid consistency: Thin  Diet effective now              EDUCATION NEEDS:   Not appropriate for education at this time  Skin:  Skin Assessment: Reviewed RN Assessment  Last BM:  7/20  Height:   Ht Readings from Last 1 Encounters:  05/07/19 5\' 8"  (1.727 m)    Weight:   Wt Readings from Last 1 Encounters:  05/07/19 85.9 kg    Ideal Body Weight:  70 kg  BMI:  Body mass index is 28.79 kg/m.  Estimated Nutritional Needs:   Kcal:  2400-2600 kcal  Protein:  120-130 kcal  Fluid:  >/= 2.6 L/day     Jarome Matin, MS, RD, LDN, [facility] Inpatient Clinical Dietitian Pager # [phone] After hours/weekend pager # [phone]

## 2019-05-08 NOTE — Discharge Summary (Addendum)
Physician Discharge Summary   Patient ID: [name] [name] MRN: [id number] DOB/AGE: 1951/04/09 68 y.o.  Admit date: 05/07/2019 Discharge date: 05/08/2019  Primary Care Physician:  System, Pcp Not In   Recommendations for Outpatient Follow-up:  1. Follow up with PCP in 1-2 weeks 2. Lisinopril, HCTZ placed on hold due to AKI  Home Health: None  Equipment/Devices:   Discharge Condition: stable  CODE STATUS: FULL  Diet recommendation: Heart healthy diet   Discharge Diagnoses:    . Sepsis ([place]) . AKI (acute kidney injury) ([place]) . Brain metastases ([place]) . Acute on chronic diarrhea . Hyperkalemia Hyponatremia Dehydration . Perforated duodenal ulcer ([place]) s/p exploratory laparotomy and placement of Graham patch 02/25/17 . Metastatic squamous cell carcinoma to lung, unspecified laterality ([facility])   Consults: None    Allergies:  No Known Allergies   DISCHARGE MEDICATIONS: Allergies as of 05/08/2019   No Known Allergies     Medication List    STOP taking these medications   hydrochlorothiazide 25 MG tablet Commonly known as: HYDRODIURIL   lisinopril 40 MG tablet Commonly known as: ZESTRIL     TAKE these medications   acetaminophen 325 MG tablet Commonly known as: TYLENOL You can take 2 every 4 hours as needed.  You can buy it over the counter DO NOT TAKE MORE THAN 4000 MG OF TYLENOL PER DAY.  IT CAN HARM YOUR LIVER. What changed:   how much to take  how to take this  when to take this  reasons to take this   aspirin EC 81 MG tablet Take 81 mg by mouth daily.   gabapentin 300 MG capsule Commonly known as: NEURONTIN TAKE 2 CAPSULES BY MOUTH 3 TIMES DAILY What changed:   how much to take  how to take this  when to take this  additional instructions   loperamide 2 MG capsule Commonly known as: IMODIUM Take 1 capsule (2 mg total) by mouth 2 (two) times daily. Also available over the counter. You can take 1 extra dose in the afternoon if diarrhea is  not improving.   mesalamine 0.375 g 24 hr capsule Commonly known as: APRISO Take 4 capsules by mouth daily.   metoprolol succinate 25 MG 24 hr tablet Commonly known as: TOPROL-XL Take 1 tablet (25 mg total) by mouth daily. Start taking on: May 10, 2019 What changed:   medication strength  how much to take  These instructions start on May 10, 2019. If you are unsure what to do until then, ask your doctor or other care provider.   mirtazapine 30 MG tablet Commonly known as: REMERON Take 1 tablet (30 mg total) by mouth at bedtime.   nortriptyline 10 MG capsule Commonly known as: PAMELOR Take 10 mg by mouth 2 (two) times daily.   pantoprazole 40 MG tablet Commonly known as: PROTONIX Take 1 tablet (40 mg total) by mouth 2 (two) times daily.   simvastatin 20 MG tablet Commonly known as: ZOCOR Take 20 mg by mouth daily.   temazepam 30 MG capsule Commonly known as: RESTORIL TAKE 1 CAPSULE BY MOUTH ONCE DAILY AT BEDTIME AS NEEDED FOR SLEEP What changed: See the new instructions.        Brief H and P: For complete details please refer to admission H and P, but in brief Patient is a 68 year old male with history of non-small cell lung cancer, metastatic, follows Dr. Julien Nordmann, hypertension, depression, neuropathy currently on immunotherapy presented for follow-up at [facility] today.  Patient reported that  he has ongoing diarrhea.  Patient had intermittent diarrhea for approximately 6 months.  For the last month he has been having 6-7 watery bowel movements every day, occasionally with abdominal cramping.  Patient also reports that he was on antibiotics for infection in his hand a week ago.  He was seen by gastroenterologist, Dr. weeks last month in [place] and was diagnosed with colitis at that time.  Patient was started on mesalamine, however patient states that it did not improve his diarrhea.  He has not tried taking Imodium or any other interventions.  Denies any  hematochezia or melena.  Occasionally has nausea but no vomiting.  Patient reported about 13 pound weight loss, food not tasting good, poor appetite and generalized weakness. Denied any fevers, shortness of breath, occasionally has left anterior rib pain on taking a deep breath.  States he has been at home mostly with caretaker, has no positive COVID-19 contact. COVID-19 test negative  Hospital Course:   Sepsis [facility]) of unclear etiology -Patient met sepsis criteria in ED, presented with hypotension, leukocytosis, tachycardia, diarrhea, acute kidney injury with metabolic acidosis, hyperkalemia -Blood cultures remain negative, COVID 19 test negative.  -Patient was placed on aggressive IV fluid hydration, for hypotension. -Initially was placed on broad-spectrum antibiotics, sepsis physiology currently resolved.  No fevers. Patient had significant improvement overnight with aggressive IV fluids hydration, much quicker than anticipated, hence safe to be discharged.  Acute on chronic diarrhea -Possibly due to the immunotherapy for metastatic squamous cell carcinoma lung.  Patient reports that his diarrhea started after he was placed on immunotherapy. -Patient has been having ongoing diarrhea for 6 months, has been following gastroenterologist at [place].  Per patient he was diagnosed with colitis and was placed on mesalamine -He recently had antibiotic course a week ago -C. difficile PCR showed negative toxin, antigen positive.  Discussed with ID, Dr.Comer, patient currently has no fevers, no abdominal pain or leukocytosis.  Had 2 BMs overnight.  Has never taken Imodium before.  -Recommended loperamide and follow-up with his gastroenterologist in [place], Dr. weeks    Metastatic squamous cell carcinoma to lung, unspecified laterality [facility]) with Brain metastases ([place]), stage IV, diagnosed in April 2018 -Underwent a systemic chemotherapy with carboplatin, paclitaxel, status post 6 cycles, currently  being treated with immunotherapy every 4 weeks, status post 17 cycles. -Patient was seen by oncology on the day of admission.    Perforated duodenal ulcer ([place]) s/p exploratory laparotomy and placement of Graham patch 02/25/17 -Continue PPI, no acute issues    AKI (acute kidney injury) ([place]) with metabolic acidosis, anion gap 13 -Likely due to GI losses, dehydration, lisinopril and HCTZ. Creatinine 3.47 at the time of admission, patient was placed on aggressive IV fluid hydration Currently tolerating solid diet, creatinine improved to 1.5 close to his baseline Patient recommended to hold lisinopril, HCTZ as BP is still soft.  Hyponatremia Sodium 127, likely due to dehydration, poor oral intake and ongoing diarrhea Sodium improved to 135, patient recommended to hold HCTZ, lisinopril, continue hydration and regular diet    Hyperkalemia -Patient has received Lokelma dose 10 g x 1 in ED -Potassium improved 4.8 at the time of discharge.  Hypotension -Likely due to #1, continue to hold lisinopril, HCTZ. Beta-blocker was recommended to be restarted at home if BP is consistently above 130/90 and restart at half dose.   Day of Discharge S: Doing well, wants to go home.  No fevers or chills, abdominal pain, 2 BMs since admission.  BP 113/61  Pulse 88   Temp 97.8 F (36.6 C) (Oral)   Resp 18   Ht 5' 8" (1.727 m)   Wt 85.9 kg   SpO2 96%   BMI 28.79 kg/m   Physical Exam: General: Alert and awake oriented x3 not in any acute distress. HEENT: anicteric sclera, pupils reactive to light and accommodation CVS: S1-S2 clear no murmur rubs or gallops Chest: clear to auscultation bilaterally, no wheezing rales or rhonchi Abdomen: soft nontender, nondistended, normal bowel sounds Extremities: no cyanosis, clubbing or edema noted bilaterally Neuro: Cranial nerves II-XII intact, no focal neurological deficits   The results of significant diagnostics from this hospitalization (including  imaging, microbiology, ancillary and laboratory) are listed below for reference.      Procedures/Studies:  Dg Chest Port 1 View  Result Date: 05/07/2019 CLINICAL DATA:  Sepsis.  History of lung cancer. EXAM: PORTABLE CHEST 1 VIEW COMPARISON:  CT chest, abdomen, and pelvis dated Feb 27, 2019. Chest x-ray dated January 19, 2017. FINDINGS: The heart size and mediastinal contours are within normal limits. Atherosclerotic calcification of the aortic arch. Normal pulmonary vascularity. Emphysematous changes again noted. No focal consolidation, pleural effusion, or pneumothorax. No acute osseous abnormality. IMPRESSION: No active disease. Electronically Signed   By: Titus Dubin M.D.   On: 05/07/2019 15:50       LAB RESULTS: Basic Metabolic Panel: Recent Labs  Lab 05/07/19 1507 05/07/19 2147 05/08/19 0221  NA 127*  --  135  K 5.6*  --  4.8  CL 93*  --  108  CO2 21*  --  17*  GLUCOSE 111*  --  103*  BUN 31*  --  23  CREATININE 3.47* 1.99* 1.58*  CALCIUM 8.8*  --  8.0*   Liver Function Tests: Recent Labs  Lab 05/07/19 1217 05/07/19 1507  AST 9* 14*  ALT 6 8  ALKPHOS 93 84  BILITOT 0.3 0.6  PROT 7.3 7.8  ALBUMIN 3.3* 3.6   No results for input(s): LIPASE, AMYLASE in the last 168 hours. No results for input(s): AMMONIA in the last 168 hours. CBC: Recent Labs  Lab 05/07/19 1507 05/07/19 2147 05/08/19 0221  WBC 12.9* 8.3 9.4  NEUTROABS 6.3  --   --   HGB 15.1 12.5* 13.2  HCT 48.0 39.0 42.7  MCV 92.0 92.6 94.1  PLT 456* 361 375   Cardiac Enzymes: No results for input(s): CKTOTAL, CKMB, CKMBINDEX, TROPONINI in the last 168 hours. BNP: Invalid input(s): POCBNP CBG: No results for input(s): GLUCAP in the last 168 hours.    Disposition and Follow-up: Discharge Instructions    Diet - low sodium heart healthy   Complete by: As directed    Discharge instructions   Complete by: As directed    Please HOLD your BP medications, Hydrochlorothiazide and lisinopril. You  may restart toprol XL 29m daily in 2-3 days if BP is consistently above 130's/90's. Take Imodium twice a day, may take extra dose if diarrhea not improving. If you are constipated or no BM in 24hours, you can stop or hold imodium.   Increase activity slowly   Complete by: As directed        DISPOSITION: Home   DISCHARGE FOLLOW-UP  - patient recommended to follow[place]enter[place]    Time coordinating discharge:  25 minutes  Signed:   REstill CottaM.D. [facility] 05/08/2019, 11:42 AM

## 2019-05-12 LAB — CULTURE, BLOOD (ROUTINE X 2)
Culture: NO GROWTH
Culture: NO GROWTH
Special Requests: ADEQUATE
Special Requests: ADEQUATE

## 2019-05-17 ENCOUNTER — Telehealth: Payer: Self-pay | Admitting: Medical Oncology

## 2019-05-17 NOTE — Telephone Encounter (Signed)
Pt instructed to avoid electrolyte drinks ( hx high K+) and to keep hydrated with water, non-caffeine drinks, watermelon . He voiced understanding.

## 2019-05-22 ENCOUNTER — Telehealth: Payer: Self-pay | Admitting: Internal Medicine

## 2019-05-22 ENCOUNTER — Other Ambulatory Visit: Payer: Self-pay | Admitting: *Deleted

## 2019-05-22 ENCOUNTER — Encounter: Payer: Self-pay | Admitting: Internal Medicine

## 2019-05-22 ENCOUNTER — Other Ambulatory Visit: Payer: Self-pay

## 2019-05-22 ENCOUNTER — Inpatient Hospital Stay: Payer: Medicare Other

## 2019-05-22 ENCOUNTER — Inpatient Hospital Stay: Payer: Medicare Other | Attending: Internal Medicine

## 2019-05-22 ENCOUNTER — Inpatient Hospital Stay (HOSPITAL_BASED_OUTPATIENT_CLINIC_OR_DEPARTMENT_OTHER): Payer: Medicare Other | Admitting: Internal Medicine

## 2019-05-22 VITALS — BP 118/79 | HR 77 | Temp 98.9°F | Resp 18 | Ht 68.0 in | Wt 185.2 lb

## 2019-05-22 DIAGNOSIS — C3411 Malignant neoplasm of upper lobe, right bronchus or lung: Secondary | ICD-10-CM | POA: Diagnosis present

## 2019-05-22 DIAGNOSIS — C3412 Malignant neoplasm of upper lobe, left bronchus or lung: Secondary | ICD-10-CM | POA: Diagnosis present

## 2019-05-22 DIAGNOSIS — Z5112 Encounter for antineoplastic immunotherapy: Secondary | ICD-10-CM | POA: Insufficient documentation

## 2019-05-22 DIAGNOSIS — R5382 Chronic fatigue, unspecified: Secondary | ICD-10-CM

## 2019-05-22 DIAGNOSIS — C7800 Secondary malignant neoplasm of unspecified lung: Secondary | ICD-10-CM

## 2019-05-22 DIAGNOSIS — C3490 Malignant neoplasm of unspecified part of unspecified bronchus or lung: Secondary | ICD-10-CM | POA: Diagnosis not present

## 2019-05-22 DIAGNOSIS — C7931 Secondary malignant neoplasm of brain: Secondary | ICD-10-CM | POA: Diagnosis not present

## 2019-05-22 DIAGNOSIS — G629 Polyneuropathy, unspecified: Secondary | ICD-10-CM | POA: Diagnosis not present

## 2019-05-22 LAB — CBC WITH DIFFERENTIAL (CANCER CENTER ONLY)
Abs Immature Granulocytes: 0.04 10*3/uL (ref 0.00–0.07)
Basophils Absolute: 0.1 10*3/uL (ref 0.0–0.1)
Basophils Relative: 1 %
Eosinophils Absolute: 0.2 10*3/uL (ref 0.0–0.5)
Eosinophils Relative: 2 %
HCT: 41.4 % (ref 39.0–52.0)
Hemoglobin: 13.2 g/dL (ref 13.0–17.0)
Immature Granulocytes: 0 %
Lymphocytes Relative: 35 %
Lymphs Abs: 3.7 10*3/uL (ref 0.7–4.0)
MCH: 29.5 pg (ref 26.0–34.0)
MCHC: 31.9 g/dL (ref 30.0–36.0)
MCV: 92.4 fL (ref 80.0–100.0)
Monocytes Absolute: 0.8 10*3/uL (ref 0.1–1.0)
Monocytes Relative: 8 %
Neutro Abs: 5.7 10*3/uL (ref 1.7–7.7)
Neutrophils Relative %: 54 %
Platelet Count: 287 10*3/uL (ref 150–400)
RBC: 4.48 MIL/uL (ref 4.22–5.81)
RDW: 14.7 % (ref 11.5–15.5)
WBC Count: 10.6 10*3/uL — ABNORMAL HIGH (ref 4.0–10.5)
nRBC: 0 % (ref 0.0–0.2)

## 2019-05-22 LAB — CMP (CANCER CENTER ONLY)
ALT: 11 U/L (ref 0–44)
AST: 14 U/L — ABNORMAL LOW (ref 15–41)
Albumin: 3.3 g/dL — ABNORMAL LOW (ref 3.5–5.0)
Alkaline Phosphatase: 66 U/L (ref 38–126)
Anion gap: 8 (ref 5–15)
BUN: 11 mg/dL (ref 8–23)
CO2: 24 mmol/L (ref 22–32)
Calcium: 8.8 mg/dL — ABNORMAL LOW (ref 8.9–10.3)
Chloride: 105 mmol/L (ref 98–111)
Creatinine: 0.93 mg/dL (ref 0.61–1.24)
GFR, Est AFR Am: >60 mL/min (ref >60)
GFR, Estimated: >60 mL/min (ref >60)
Glucose, Bld: 98 mg/dL (ref 70–99)
Potassium: 4.6 mmol/L (ref 3.5–5.1)
Sodium: 137 mmol/L (ref 135–145)
Total Bilirubin: <0.2 mg/dL — ABNORMAL LOW (ref 0.3–1.2)
Total Protein: 6.9 g/dL (ref 6.5–8.1)

## 2019-05-22 MED ORDER — SODIUM CHLORIDE 0.9 % IV SOLN
480.0000 mg | Freq: Once | INTRAVENOUS | Status: AC
  Administered 2019-05-22: 15:00:00 480 mg via INTRAVENOUS
  Filled 2019-05-22: qty 48

## 2019-05-22 MED ORDER — SODIUM CHLORIDE 0.9 % IV SOLN
Freq: Once | INTRAVENOUS | Status: AC
  Administered 2019-05-22: 14:00:00 via INTRAVENOUS
  Filled 2019-05-22: qty 250

## 2019-05-22 NOTE — Telephone Encounter (Signed)
Gave avs and calendar ° °

## 2019-05-22 NOTE — Progress Notes (Signed)
[facility] [phone]   [phone]  OFFICE PROGRESS NOTE  System, Pcp Not In No address on file  DIAGNOSIS: Stage IV (T3, N0, M1 a) non-small cell lung cancer, squamous cell carcinoma presented with multiple bilateral pulmonary nodules diagnosed in April 2018.  PRIOR THERAPY:  Systemic chemotherapy with carboplatin for AUC of 5 and paclitaxel 175 MG/M2 every 3 weeks with Neulasta support. Status post 6 cycles.  CURRENT THERAPY: Second line treatment with immunotherapy with Nivolumab 480 mg IV every 4 weeks.  First cycle November 14, 2017.  Status post 17 cycles.  INTERVAL HISTORY: [name] [name] 68 y.o. male returns to the clinic today for follow-up visit.  The patient is feeling much better today.  He has 1 or 2 episodes of diarrhea.  He denied having any nausea, vomiting or constipation.  He denied having any chest pain, shortness of breath, cough or hemoptysis.  He has no significant weight loss or night sweats.  He is here today for evaluation before resuming his treatment with immunotherapy with nivolumab.  MEDICAL HISTORY: Past Medical History:  Diagnosis Date  . Depression 01/29/2017  . Dyspnea   . Encounter for antineoplastic chemotherapy 01/29/2017  . Goals of care, counseling/discussion 01/29/2017  . Hypertension   . Metastatic squamous cell carcinoma to lung, unspecified laterality ([place]) 01/28/2017  . Stroke [facility])     ALLERGIES:  has No Known Allergies.  MEDICATIONS:  Current Outpatient Medications  Medication Sig Dispense Refill  . acetaminophen (TYLENOL) 325 MG tablet You can take 2 every 4 hours as needed.  You can buy it over the counter DO NOT TAKE MORE THAN 4000 MG OF TYLENOL PER DAY.  IT CAN HARM YOUR LIVER. (Patient taking differently: Take 650 mg by mouth every 4 (four) hours as needed for mild pain or moderate pain. You can take 2 every 4 hours as needed.  You can buy it over the counter DO NOT TAKE MORE THAN 4000 MG OF  TYLENOL PER DAY.  IT CAN HARM YOUR LIVER.)    . aspirin EC 81 MG tablet Take 81 mg by mouth daily.    Marland Kitchen gabapentin (NEURONTIN) 300 MG capsule TAKE 2 CAPSULES BY MOUTH 3 TIMES DAILY (Patient taking differently: Take 600 mg by mouth 3 (three) times daily. TAKE 2 CAPSULES (600 MG) BY MOUTH 3 TIMES DAILY) 90 capsule 3  . loperamide (IMODIUM) 2 MG capsule Take 1 capsule (2 mg total) by mouth 2 (two) times daily. Also available over the counter. You can take 1 extra dose in the afternoon if diarrhea is not improving. 60 capsule 4  . mesalamine (APRISO) 0.375 g 24 hr capsule Take 4 capsules by mouth daily.    . metoprolol succinate (TOPROL-XL) 25 MG 24 hr tablet Take 1 tablet (25 mg total) by mouth daily. 30 tablet 4  . mirtazapine (REMERON) 30 MG tablet Take 1 tablet (30 mg total) by mouth at bedtime. 30 tablet 1  . nortriptyline (PAMELOR) 10 MG capsule Take 10 mg by mouth 2 (two) times daily.     . pantoprazole (PROTONIX) 40 MG tablet Take 1 tablet (40 mg total) by mouth 2 (two) times daily. 60 tablet 1  . simvastatin (ZOCOR) 20 MG tablet Take 20 mg by mouth daily.    . temazepam (RESTORIL) 30 MG capsule TAKE 1 CAPSULE BY MOUTH ONCE DAILY AT BEDTIME AS NEEDED FOR SLEEP (Patient taking differently: Take 30 mg by mouth at bedtime. ) 30 capsule  0   No current facility-administered medications for this visit.     SURGICAL HISTORY:  Past Surgical History:  Procedure Laterality Date  . BACK SURGERY     cyst removal  . COLONOSCOPY    . LAPAROTOMY N/A 02/25/2017   Procedure: EXPLORATORY LAPAROTOMY MODIFIED GRAHAM'S PATCH;  Surgeon: Kinsinger, Arta Bruce, MD;  Location: [facility];  Service: General;  Laterality: N/A;  . MINOR PLACEMENT OF FIDUCIAL N/A 01/19/2017   Procedure: MINOR PLACEMENT OF FIDUCIAL x 6;  Surgeon: Collene Gobble, MD;  Location: [facility];  Service: Thoracic;  Laterality: N/A;  . VIDEO BRONCHOSCOPY WITH ENDOBRONCHIAL NAVIGATION N/A 01/19/2017   Procedure: VIDEO BRONCHOSCOPY WITH ENDOBRONCHIAL  NAVIGATION;  Surgeon: Collene Gobble, MD;  Location: [facility];  Service: Thoracic;  Laterality: N/A;    REVIEW OF SYSTEMS:  A comprehensive review of systems was negative except for: Gastrointestinal: positive for diarrhea   PHYSICAL EXAMINATION: General appearance: alert, cooperative and no distress Head: Normocephalic, without obvious abnormality, atraumatic Neck: no adenopathy, no JVD, supple, symmetrical, trachea midline and thyroid not enlarged, symmetric, no tenderness/mass/nodules Lymph nodes: Cervical, supraclavicular, and axillary nodes normal. Resp: clear to auscultation bilaterally Back: symmetric, no curvature. ROM normal. No CVA tenderness. Cardio: regular rate and rhythm, S1, S2 normal, no murmur, click, rub or gallop GI: soft, non-tender; bowel sounds normal; no masses,  no organomegaly Extremities: extremities normal, atraumatic, no cyanosis or edema  ECOG PERFORMANCE STATUS: 1 - Symptomatic but completely ambulatory   Blood pressure 118/79, pulse 77, temperature 98.9 F (37.2 C), temperature source Oral, resp. rate 18, height 5\' 8"  (1.727 m), weight 185 lb 3.2 oz (84 kg), SpO2 95 %.  LABORATORY DATA: Lab Results  Component Value Date   WBC 10.6 (H) 05/22/2019   HGB 13.2 05/22/2019   HCT 41.4 05/22/2019   MCV 92.4 05/22/2019   PLT 287 05/22/2019      Chemistry      Component Value Date/Time   NA 135 05/08/2019 0221   NA 139 10/06/2017 1052   K 4.8 05/08/2019 0221   K 4.2 10/06/2017 1052   CL 108 05/08/2019 0221   CO2 17 (L) 05/08/2019 0221   CO2 25 10/06/2017 1052   BUN 23 05/08/2019 0221   BUN 13.2 10/06/2017 1052   CREATININE 1.58 (H) 05/08/2019 0221   CREATININE 2.94 (H) 05/07/2019 1217   CREATININE 0.9 10/06/2017 1052      Component Value Date/Time   CALCIUM 8.0 (L) 05/08/2019 0221   CALCIUM 9.5 10/06/2017 1052   ALKPHOS 84 05/07/2019 1507   ALKPHOS 68 10/06/2017 1052   AST 14 (L) 05/07/2019 1507   AST 9 (L) 05/07/2019 1217   AST 14 10/06/2017  1052   ALT 8 05/07/2019 1507   ALT 6 05/07/2019 1217   ALT 13 10/06/2017 1052   BILITOT 0.6 05/07/2019 1507   BILITOT 0.3 05/07/2019 1217   BILITOT 0.44 10/06/2017 1052       RADIOGRAPHIC STUDIES: Dg Chest Port 1 View  Result Date: 05/07/2019 CLINICAL DATA:  Sepsis.  History of lung cancer. EXAM: PORTABLE CHEST 1 VIEW COMPARISON:  CT chest, abdomen, and pelvis dated Feb 27, 2019. Chest x-ray dated January 19, 2017. FINDINGS: The heart size and mediastinal contours are within normal limits. Atherosclerotic calcification of the aortic arch. Normal pulmonary vascularity. Emphysematous changes again noted. No focal consolidation, pleural effusion, or pneumothorax. No acute osseous abnormality. IMPRESSION: No active disease. Electronically Signed   By: Titus Dubin M.D.   On: 05/07/2019  15:50    ASSESSMENT AND PLAN:  This is a very pleasant 68 years old white male with stage IV non-small cell lung cancer, squamous cell carcinoma presented with multiple bilateral pulmonary nodules. The patient was treated with systemic chemotherapy with carboplatin and paclitaxel status post 6 cycles. He tolerated the last cycle of his treatment well except for the persistent peripheral neuropathy. The patient has been in observation for the last 3 months. He had a repeat CT scan of the chest, abdomen and pelvis performed recently.  Has a scan showed mild interval enlargement of left upper lobe as well as right upper lobe pulmonary nodules concerning for disease progression. The patient was started on treatment with immunotherapy with Nivolumab 480 mg IV every 4 weeks status post 17 cycles.   The patient has been tolerating his treatment well except for the few episodes of diarrhea which significantly improved. I recommended for him to proceed with cycle #18 today as planned. I will see him back for follow-up visit in 4 weeks for evaluation after repeating CT scan of the chest, abdomen and pelvis for restaging of  his disease. For the peripheral neuropathy he will continue his current treatment with Neurontin 600 mg p.o. 3 times daily. He was advised to call immediately if he has any concerning symptoms in the interval. The patient voices understanding of current disease status and treatment options and is in agreement with the current care plan. All questions were answered. The patient knows to call the clinic with any problems, questions or concerns. We can certainly see the patient much sooner if necessary.  Disclaimer: This note was dictated with voice recognition software. Similar sounding words can inadvertently be transcribed and may not be corrected upon review.

## 2019-05-23 ENCOUNTER — Inpatient Hospital Stay: Payer: Medicare Other

## 2019-05-23 NOTE — Progress Notes (Signed)
Nutrition Assessment   Reason for Assessment:  Referral from inpatient RD.   ASSESSMENT:  68 year old male with stage IV NSCL followed by Dr. Earlie Server.  Currently receiving nivolumab.    Spoke with patient via phone today.  Patient reports that he has no taste.  Foods taste like metal.  Reports that he is using plastic utensils.  Reports that he mainly eats cereal for breakfast and mid afternoon. Evening meal is hamburger or steak or hot dog.  Is not currently drinking oral nutrition supplement.  Reports that he can taste sweeter foods like cakes.    Reports that diarrhea has improved. Has imodium that he can take if needed.      Nutrition Focused Physical Exam: deferred   Medications: imodium, mesalamine, remeron, protonix   Labs: reviewed   Anthropometrics:   Height: 68 inches Weight: 185 lb (8/4) UBW: 207 lb on 5/12 BMI: 28  11% weight loss in 3 months    Estimated Energy Needs  Kcals: 2520-2940 calories Protein: 126-147 g Fluid: 2.9 L   NUTRITION DIAGNOSIS: Inadequate oral intake related to cancer related treatment side effects as evidenced by diarrhea, lack of taste, 11% weight loss   INTERVENTION:  Discussed ways to increase calories and protein. Mailed handout Discussed different oral nutrition supplements to try.  Discussed strategies to help with taste change.  Mailed handout.  Contact information provided   MONITORING, EVALUATION, GOAL: Patient will consume adequate calories and protein to prevent further weight loss   Next Visit: Thursday, Sept 3 during infusion  Yuepheng Schaller B. Zenia Resides, [place], [place] Registered Dietitian [phone] (pager)

## 2019-05-30 ENCOUNTER — Other Ambulatory Visit: Payer: Self-pay | Admitting: Internal Medicine

## 2019-05-30 DIAGNOSIS — G4700 Insomnia, unspecified: Secondary | ICD-10-CM

## 2019-05-30 DIAGNOSIS — C7800 Secondary malignant neoplasm of unspecified lung: Secondary | ICD-10-CM

## 2019-05-30 NOTE — Telephone Encounter (Signed)
Pt called requesting refill of his Elavil 100 mg

## 2019-05-31 ENCOUNTER — Telehealth: Payer: Self-pay | Admitting: *Deleted

## 2019-05-31 NOTE — Telephone Encounter (Signed)
Received vm message from patient requesting to confirm CT scan appt for the end of August and appts in September.  TCT patient and spoke with him.  Reviewed appts with him.  Also advised that Dr. Julien Nordmann has refilled his Temazepam. Pt asked about Elavil refill from Dr. Mickeal Skinner. Will send message to him about that refill.

## 2019-05-31 NOTE — Telephone Encounter (Signed)
[name] [name] called again requesting a refill of Elavil- states he has been taking qHS

## 2019-06-01 MED ORDER — AMITRIPTYLINE HCL 100 MG PO TABS: 100.0000 mg | ORAL_TABLET | Freq: Every day | ORAL | 3 refills | Status: DC

## 2019-06-01 NOTE — Addendum Note (Signed)
Addended by: Ventura Sellers on: 06/01/2019 10:13 AM   Modules accepted: Orders

## 2019-06-11 ENCOUNTER — Other Ambulatory Visit: Payer: Self-pay | Admitting: Internal Medicine

## 2019-06-11 DIAGNOSIS — C7931 Secondary malignant neoplasm of brain: Secondary | ICD-10-CM

## 2019-06-11 DIAGNOSIS — Z5111 Encounter for antineoplastic chemotherapy: Secondary | ICD-10-CM

## 2019-06-11 DIAGNOSIS — C7800 Secondary malignant neoplasm of unspecified lung: Secondary | ICD-10-CM

## 2019-06-11 MED ORDER — GABAPENTIN 300 MG PO CAPS: ORAL_CAPSULE | ORAL | 3 refills | Status: DC

## 2019-06-18 ENCOUNTER — Ambulatory Visit (HOSPITAL_COMMUNITY)
Admission: RE | Admit: 2019-06-18 | Discharge: 2019-06-18 | Disposition: A | Discharge status: Home / Self Care | Payer: Medicare Other | Source: Ambulatory Visit | Attending: Internal Medicine | Admitting: Internal Medicine

## 2019-06-18 ENCOUNTER — Other Ambulatory Visit: Payer: Self-pay

## 2019-06-18 ENCOUNTER — Telehealth: Payer: Self-pay | Admitting: Medical Oncology

## 2019-06-18 DIAGNOSIS — C3490 Malignant neoplasm of unspecified part of unspecified bronchus or lung: Secondary | ICD-10-CM | POA: Insufficient documentation

## 2019-06-18 MED ORDER — SODIUM CHLORIDE (PF) 0.9 % IJ SOLN
INTRAMUSCULAR | Status: AC
  Filled 2019-06-18: qty 50

## 2019-06-18 MED ORDER — IOHEXOL 300 MG/ML  SOLN
100.0000 mL | Freq: Once | INTRAMUSCULAR | Status: AC | PRN
  Administered 2019-06-18: 100 mL via INTRAVENOUS

## 2019-06-18 NOTE — Telephone Encounter (Signed)
Pt will stop by cancer center today after CT scan for med reconciliation.

## 2019-06-18 NOTE — Telephone Encounter (Signed)
LVM at Dr Woody Seller to return call about metoprolol dose , pt has 25 mg and 50 mg tablets

## 2019-06-19 ENCOUNTER — Telehealth: Payer: Self-pay | Admitting: Medical Oncology

## 2019-06-19 NOTE — Telephone Encounter (Signed)
Dr. Woody Seller is cancelling the  Metoprolol 50 mg /day . Pt notified via voice message.

## 2019-06-21 ENCOUNTER — Other Ambulatory Visit: Payer: Self-pay

## 2019-06-21 ENCOUNTER — Inpatient Hospital Stay: Payer: Medicare Other

## 2019-06-21 ENCOUNTER — Encounter: Payer: Self-pay | Admitting: Physician Assistant

## 2019-06-21 ENCOUNTER — Other Ambulatory Visit: Payer: Self-pay | Admitting: Internal Medicine

## 2019-06-21 ENCOUNTER — Inpatient Hospital Stay: Payer: Medicare Other | Admitting: Nutrition

## 2019-06-21 ENCOUNTER — Telehealth: Payer: Self-pay | Admitting: Physician Assistant

## 2019-06-21 ENCOUNTER — Inpatient Hospital Stay: Payer: Medicare Other | Attending: Internal Medicine | Admitting: Physician Assistant

## 2019-06-21 VITALS — BP 140/91 | HR 85 | Temp 98.0°F | Resp 20 | Ht 68.0 in | Wt 186.6 lb

## 2019-06-21 DIAGNOSIS — Z5112 Encounter for antineoplastic immunotherapy: Secondary | ICD-10-CM | POA: Insufficient documentation

## 2019-06-21 DIAGNOSIS — C7800 Secondary malignant neoplasm of unspecified lung: Secondary | ICD-10-CM

## 2019-06-21 DIAGNOSIS — C3412 Malignant neoplasm of upper lobe, left bronchus or lung: Secondary | ICD-10-CM | POA: Diagnosis present

## 2019-06-21 DIAGNOSIS — I10 Essential (primary) hypertension: Secondary | ICD-10-CM | POA: Insufficient documentation

## 2019-06-21 DIAGNOSIS — Z79899 Other long term (current) drug therapy: Secondary | ICD-10-CM | POA: Insufficient documentation

## 2019-06-21 DIAGNOSIS — G629 Polyneuropathy, unspecified: Secondary | ICD-10-CM | POA: Diagnosis not present

## 2019-06-21 DIAGNOSIS — C3411 Malignant neoplasm of upper lobe, right bronchus or lung: Secondary | ICD-10-CM | POA: Insufficient documentation

## 2019-06-21 DIAGNOSIS — R5383 Other fatigue: Secondary | ICD-10-CM

## 2019-06-21 LAB — CBC WITH DIFFERENTIAL (CANCER CENTER ONLY)
Abs Immature Granulocytes: 0.02 10*3/uL (ref 0.00–0.07)
Basophils Absolute: 0.1 10*3/uL (ref 0.0–0.1)
Basophils Relative: 1 %
Eosinophils Absolute: 0.2 10*3/uL (ref 0.0–0.5)
Eosinophils Relative: 2 %
HCT: 41 % (ref 39.0–52.0)
Hemoglobin: 13.3 g/dL (ref 13.0–17.0)
Immature Granulocytes: 0 %
Lymphocytes Relative: 39 %
Lymphs Abs: 3.5 10*3/uL (ref 0.7–4.0)
MCH: 30 pg (ref 26.0–34.0)
MCHC: 32.4 g/dL (ref 30.0–36.0)
MCV: 92.6 fL (ref 80.0–100.0)
Monocytes Absolute: 0.7 10*3/uL (ref 0.1–1.0)
Monocytes Relative: 8 %
Neutro Abs: 4.5 10*3/uL (ref 1.7–7.7)
Neutrophils Relative %: 50 %
Platelet Count: 260 10*3/uL (ref 150–400)
RBC: 4.43 MIL/uL (ref 4.22–5.81)
RDW: 14.5 % (ref 11.5–15.5)
WBC Count: 9 10*3/uL (ref 4.0–10.5)
nRBC: 0 % (ref 0.0–0.2)

## 2019-06-21 LAB — CMP (CANCER CENTER ONLY)
ALT: 11 U/L (ref 0–44)
AST: 16 U/L (ref 15–41)
Albumin: 3.9 g/dL (ref 3.5–5.0)
Alkaline Phosphatase: 76 U/L (ref 38–126)
Anion gap: 8 (ref 5–15)
BUN: 11 mg/dL (ref 8–23)
CO2: 27 mmol/L (ref 22–32)
Calcium: 9.1 mg/dL (ref 8.9–10.3)
Chloride: 104 mmol/L (ref 98–111)
Creatinine: 0.96 mg/dL (ref 0.61–1.24)
GFR, Est AFR Am: >60 mL/min (ref >60)
GFR, Estimated: >60 mL/min (ref >60)
Glucose, Bld: 115 mg/dL — ABNORMAL HIGH (ref 70–99)
Potassium: 3.9 mmol/L (ref 3.5–5.1)
Sodium: 139 mmol/L (ref 135–145)
Total Bilirubin: 0.4 mg/dL (ref 0.3–1.2)
Total Protein: 7.3 g/dL (ref 6.5–8.1)

## 2019-06-21 LAB — TSH: TSH: 3.684 u[IU]/mL (ref 0.320–4.118)

## 2019-06-21 MED ORDER — SODIUM CHLORIDE 0.9 % IV SOLN
480.0000 mg | Freq: Once | INTRAVENOUS | Status: AC
  Administered 2019-06-21: 16:00:00 480 mg via INTRAVENOUS
  Filled 2019-06-21: qty 48

## 2019-06-21 MED ORDER — SODIUM CHLORIDE 0.9 % IV SOLN
Freq: Once | INTRAVENOUS | Status: AC
  Administered 2019-06-21: 15:00:00 via INTRAVENOUS
  Filled 2019-06-21: qty 250

## 2019-06-21 NOTE — Progress Notes (Signed)
Nutrition follow-up completed with patient receiving treatment for stage IV non-small cell lung cancer. Weight stable and documented as 186.6 pounds September 3. Patient denies nutrition impact symptoms. Reports he has been drinking Ensure. Interested in a food list to help provide additional calories and protein.  Nutrition diagnosis: Inadequate oral intake improved.  Intervention: Educated patient on the importance of increasing calories and protein in small frequent meals and snacks. Recommended patient drink Ensure Plus or Ensure Enlive for breakfast since he typically will not eat food until lunchtime. Provided handout on increasing calories and protein.  Provided samples with coupons and contact information. Questions were answered.  Teach back method used.  Monitoring, evaluation, goals: Patient will work to continue adequate calories and protein to minimize weight loss.  Next visit: To be scheduled as needed.  **Disclaimer: This note was dictated with voice recognition software. Similar sounding words can inadvertently be transcribed and this note may contain transcription errors which may not have been corrected upon publication of note.**

## 2019-06-21 NOTE — Telephone Encounter (Signed)
Gave avs and calendar ° °

## 2019-06-21 NOTE — Patient Instructions (Signed)
[facility] Discharge Instructions for Patients Receiving Chemotherapy  Today you received the following immunotherapy agents:  opdivo/Novolumab  To help prevent nausea and vomiting after your treatment, we encourage you to take your nausea medication as needed.  If you develop nausea and vomiting that is not controlled by your nausea medication, call the clinic.   BELOW ARE SYMPTOMS THAT SHOULD BE REPORTED IMMEDIATELY:  *FEVER GREATER THAN 100.5 F  *CHILLS WITH OR WITHOUT FEVER  NAUSEA AND VOMITING THAT IS NOT CONTROLLED WITH YOUR NAUSEA MEDICATION  *UNUSUAL SHORTNESS OF BREATH  *UNUSUAL BRUISING OR BLEEDING  TENDERNESS IN MOUTH AND THROAT WITH OR WITHOUT PRESENCE OF ULCERS  *URINARY PROBLEMS  *BOWEL PROBLEMS  UNUSUAL RASH Items with * indicate a potential emergency and should be followed up as soon as possible.  Feel free to call the clinic should you have any questions or concerns. The clinic phone number is [phone].  Please show the [facility] at check-in to the Emergency Department and triage nurse.

## 2019-06-21 NOTE — Progress Notes (Signed)
[facility] OFFICE PROGRESS NOTE  System, Pcp Not In No address on file  DIAGNOSIS: Stage IV (T3, N0, M1 a) non-small cell lung cancer, squamous cell carcinoma presented with multiple bilateral pulmonary nodules diagnosed in April 2018.  PRIOR THERAPY: Systemic chemotherapy with carboplatin for AUC of 5 and paclitaxel 175 MG/M2 every 3 weeks with Neulasta support. Status post 6 cycles.  CURRENT THERAPY: Second line treatment with immunotherapy was Nivolumab 480 mg IV every 4 weeks. First cycle November 14, 2017. Status post 18cycles  INTERVAL HISTORY: [name] [name] 68 y.o. male returns to the clinic for a follow-up visit.  The patient is feeling fairly well today without any concerning complaints except for continued peripheral neuropathy.  The patient is currently prescribed gabapentin 600 mg 3 times daily for this condition.  He is also prescribed amitriptyline.  The patient had been experiencing lightheadedness upon ambulation recently. His medications were recently adjusted and his symptoms have subsided with adjustment of his blood pressure medications.   The patient had been tolerated his last cycle of treatment well without any adverse side effects.  He denies any fever, chills, night sweats, or weight loss. He is meeting with the cancer center nutritionist today. He has been drinking plenty of water recently as well as drinking ensure. He denies any chest pain, shortness of breath, cough, or hemoptysis.  He is with his gastroenterologist in his hometown of [place], Apolonio Schneiders for his colitis/diarrhea. The patient is taking mesalamine as prescribed. At the most, he may have 2-3 loose bowel movements per day.  He denies any nausea, vomiting, or constipation.  He denies any headache or visual changes.  Denies any rashes or skin changes. He recently had a restaging CT scan performed.  He is here today for evaluation before starting cycle #19.  MEDICAL HISTORY: Past Medical History:   Diagnosis Date  . Depression 01/29/2017  . Dyspnea   . Encounter for antineoplastic chemotherapy 01/29/2017  . Goals of care, counseling/discussion 01/29/2017  . Hypertension   . Metastatic squamous cell carcinoma to lung, unspecified laterality ([facility]) 01/28/2017  . Stroke [facility])     ALLERGIES:  has No Known Allergies.  MEDICATIONS:  Current Outpatient Medications  Medication Sig Dispense Refill  . acetaminophen (TYLENOL) 325 MG tablet You can take 2 every 4 hours as needed.  You can buy it over the counter DO NOT TAKE MORE THAN 4000 MG OF TYLENOL PER DAY.  IT CAN HARM YOUR LIVER. (Patient taking differently: Take 650 mg by mouth every 4 (four) hours as needed for mild pain or moderate pain. You can take 2 every 4 hours as needed.  You can buy it over the counter DO NOT TAKE MORE THAN 4000 MG OF TYLENOL PER DAY.  IT CAN HARM YOUR LIVER.)    . amitriptyline (ELAVIL) 100 MG tablet Take 1 tablet (100 mg total) by mouth at bedtime. 30 tablet 3  . aspirin EC 81 MG tablet Take 81 mg by mouth daily.    Marland Kitchen gabapentin (NEURONTIN) 300 MG capsule TAKE 2 CAPSULES BY MOUTH 3 TIMES DAILY 90 capsule 3  . loperamide (IMODIUM) 2 MG capsule Take 1 capsule (2 mg total) by mouth 2 (two) times daily. Also available over the counter. You can take 1 extra dose in the afternoon if diarrhea is not improving. 60 capsule 4  . mesalamine (APRISO) 0.375 g 24 hr capsule Take 4 capsules by mouth daily.    . metoprolol succinate (TOPROL-XL) 25 MG 24 hr  tablet Take 1 tablet (25 mg total) by mouth daily. 30 tablet 4  . mirtazapine (REMERON) 30 MG tablet Take 1 tablet (30 mg total) by mouth at bedtime. 30 tablet 1  . nortriptyline (PAMELOR) 10 MG capsule Take 10 mg by mouth 2 (two) times daily.     . pantoprazole (PROTONIX) 40 MG tablet Take 1 tablet (40 mg total) by mouth 2 (two) times daily. 60 tablet 1  . simvastatin (ZOCOR) 20 MG tablet Take 20 mg by mouth daily.    . temazepam (RESTORIL) 30 MG capsule TAKE 1 CAPSULE BY  MOUTH ONCE DAILY AT BEDTIME AS NEEDED FOR SLEEP 30 capsule 0   No current facility-administered medications for this visit.     SURGICAL HISTORY:  Past Surgical History:  Procedure Laterality Date  . BACK SURGERY     cyst removal  . COLONOSCOPY    . LAPAROTOMY N/A 02/25/2017   Procedure: EXPLORATORY LAPAROTOMY MODIFIED GRAHAM'S PATCH;  Surgeon: Kinsinger, Arta Bruce, MD;  Location: [facility];  Service: General;  Laterality: N/A;  . MINOR PLACEMENT OF FIDUCIAL N/A 01/19/2017   Procedure: MINOR PLACEMENT OF FIDUCIAL x 6;  Surgeon: Collene Gobble, MD;  Location: [facility];  Service: Thoracic;  Laterality: N/A;  . VIDEO BRONCHOSCOPY WITH ENDOBRONCHIAL NAVIGATION N/A 01/19/2017   Procedure: VIDEO BRONCHOSCOPY WITH ENDOBRONCHIAL NAVIGATION;  Surgeon: Collene Gobble, MD;  Location: [facility];  Service: Thoracic;  Laterality: N/A;    REVIEW OF SYSTEMS:   Review of Systems  Constitutional: Negative for appetite change, chills, fatigue, fever and unexpected weight change.  HENT: Negative for mouth sores, nosebleeds, sore throat and trouble swallowing.   Eyes: Negative for eye problems and icterus.  Respiratory: Negative for cough, hemoptysis, shortness of breath and wheezing.   Cardiovascular: Negative for chest pain and leg swelling.  Gastrointestinal: Positive for occasional diarrhea. Negative for abdominal pain, constipation, nausea and vomiting.  Genitourinary: Negative for bladder incontinence, difficulty urinating, dysuria, frequency and hematuria.   Musculoskeletal: Negative for back pain, gait problem, neck pain and neck stiffness.  Skin: Negative for itching and rash.  Neurological: Positive for peripheral neuropathy. Negative for dizziness, extremity weakness, gait problem, headaches, light-headedness and seizures.  Hematological: Negative for adenopathy. Does not bruise/bleed easily.  Psychiatric/Behavioral: Negative for confusion, depression and sleep disturbance. The patient is not  nervous/anxious.     PHYSICAL EXAMINATION:  Blood pressure (!) 140/91, pulse 85, temperature 98 F (36.7 C), temperature source Oral, resp. rate 20, height 5\' 8"  (1.727 m), weight 186 lb 9.6 oz (84.6 kg), SpO2 94 %.  ECOG PERFORMANCE STATUS: 1 - Symptomatic but completely ambulatory  Physical Exam  Constitutional: Oriented to person, place, and time and well-developed, well-nourished, and in no distress. HENT:  Head: Normocephalic and atraumatic.  Mouth/Throat: Oropharynx is clear and moist. No oropharyngeal exudate.  Eyes: Conjunctivae are normal. Right eye exhibits no discharge. Left eye exhibits no discharge. No scleral icterus.  Neck: Normal range of motion. Neck supple.  Cardiovascular: Normal rate, regular rhythm, normal heart sounds and intact distal pulses.   Pulmonary/Chest: Effort normal and breath sounds normal. No respiratory distress. No wheezes. No rales.  Abdominal: Soft. Bowel sounds are normal. Exhibits no distension and no mass. There is no tenderness.  Musculoskeletal: Normal range of motion. Exhibits no edema.  Lymphadenopathy:    No cervical adenopathy.  Neurological: Alert and oriented to person, place, and time. Exhibits normal muscle tone. Gait normal. Coordination normal.  Skin: Skin is warm and dry. No rash noted.  Not diaphoretic. No erythema. No pallor.  Psychiatric: Mood, memory and judgment normal.  Vitals reviewed.  LABORATORY DATA: Lab Results  Component Value Date   WBC 9.0 06/21/2019   HGB 13.3 06/21/2019   HCT 41.0 06/21/2019   MCV 92.6 06/21/2019   PLT 260 06/21/2019      Chemistry      Component Value Date/Time   NA 139 06/21/2019 1304   NA 139 10/06/2017 1052   K 3.9 06/21/2019 1304   K 4.2 10/06/2017 1052   CL 104 06/21/2019 1304   CO2 27 06/21/2019 1304   CO2 25 10/06/2017 1052   BUN 11 06/21/2019 1304   BUN 13.2 10/06/2017 1052   CREATININE 0.96 06/21/2019 1304   CREATININE 0.9 10/06/2017 1052      Component Value Date/Time    CALCIUM 9.1 06/21/2019 1304   CALCIUM 9.5 10/06/2017 1052   ALKPHOS 76 06/21/2019 1304   ALKPHOS 68 10/06/2017 1052   AST 16 06/21/2019 1304   AST 14 10/06/2017 1052   ALT 11 06/21/2019 1304   ALT 13 10/06/2017 1052   BILITOT 0.4 06/21/2019 1304   BILITOT 0.44 10/06/2017 1052       RADIOGRAPHIC STUDIES:  Ct Chest W Contrast  Result Date: 06/18/2019 CLINICAL DATA:  Non-small-cell lung cancer.  Restaging. EXAM: CT CHEST, ABDOMEN, AND PELVIS WITH CONTRAST TECHNIQUE: Multidetector CT imaging of the chest, abdomen and pelvis was performed following the standard protocol during bolus administration of intravenous contrast. CONTRAST:  161mL OMNIPAQUE IOHEXOL 300 MG/ML  SOLN COMPARISON:  02/27/2019 FINDINGS: CT CHEST FINDINGS Cardiovascular: The heart size is normal. No substantial pericardial effusion. Coronary artery calcification is evident. Atherosclerotic calcification is noted in the wall of the thoracic aorta. Mediastinum/Nodes: No mediastinal lymphadenopathy. There is no hilar lymphadenopathy. The esophagus has normal imaging features. There is no axillary lymphadenopathy. Lungs/Pleura: Centrilobular and paraseptal emphysema evident. Bilateral pulmonary nodules again noted. Index right upper lobe nodule measured previously at 1.3 cm is now 0.9 cm (image 39/series 4). Posterior sub solid pulmonary nodule measured previouslyr at 10 mm is 7 mm today (55/4). The index irregular nodule in the posterior left upper lobe, adjacent to fiducials, is similar at 1.4 x 0.5 cm today (62/4) compared to 1.3 x 0.9 cm previously. Remaining scattered tiny pulmonary nodules are stable. Atelectasis noted posterior left upper lobe on the previous exam has resolved. No new suspicious pulmonary nodule or mass. No pleural effusion. Musculoskeletal: No worrisome lytic or sclerotic osseous abnormality. Nonacute posterior left ninth rib fracture is new in the interval. CT ABDOMEN PELVIS FINDINGS Hepatobiliary: No  suspicious focal abnormality within the liver parenchyma. There is no evidence for gallstones, gallbladder wall thickening, or pericholecystic fluid. No intrahepatic or extrahepatic biliary dilation. Pancreas: No focal mass lesion. No dilatation of the main duct. No intraparenchymal cyst. No peripancreatic edema. Spleen: No splenomegaly. No focal mass lesion. Adrenals/Urinary Tract: Similar nodular thickening left adrenal gland with 1.6 cm adrenal adenoma. Focal scarring noted upper pole right kidney. Kidneys otherwise unremarkable. No evidence for hydroureter. Question mild anterior bladder wall thickening (121/2). Stomach/Bowel: Stomach is unremarkable. No gastric wall thickening. No evidence of outlet obstruction. Duodenum is normally positioned as is the ligament of Treitz. No small bowel wall thickening. No small bowel dilatation. The terminal ileum is normal. The appendix is normal. Diverticular changes are noted in the left colon without evidence of diverticulitis. Vascular/Lymphatic: Atherosclerotic calcification noted in the abdominal aorta with infrarenal segment measuring up to 3.3 cm diameter. There is no gastrohepatic  or hepatoduodenal ligament lymphadenopathy. No intraperitoneal or retroperitoneal lymphadenopathy. No pelvic sidewall lymphadenopathy. Reproductive: The prostate gland and seminal vesicles are unremarkable. Other: No intraperitoneal free fluid. Musculoskeletal: Small bilateral groin hernias contain only fat. No worrisome lytic or sclerotic osseous abnormality. IMPRESSION: 1. The bilateral index pulmonary nodules are stable or show continued further interval decrease in size. No new or progressive pulmonary nodule or mass identified on today's exam. Other scattered tiny pulmonary nodules are stable. 2. The posterior left upper lobe collapse/consolidation seen previously has resolved completely, compatible with atelectasis. 3. Stable left adrenal adenoma without evidence for metastatic  disease in the abdomen or pelvis. 4. 4.2 cm aneurysm of the ascending thoracic aorta. Recommend annual imaging followup by CTA or MRA. This recommendation follows 2010 ACCF/AHA/AATS/ACR/ASA/SCA/SCAI/SIR/STS/SVM Guidelines for the Diagnosis and Management of Patients with Thoracic Aortic Disease. Circulation. 2010; 121: W413-K440. Aortic aneurysm NOS (ICD10-I71.9) 5. 3.3 cm abdominal aortic aneurysm. Recommend followup by ultrasound in 3 years. This recommendation follows ACR consensus guidelines: White Paper of the ACR Incidental Findings Committee II on Vascular Findings. J Am Coll Radiol 2013; 10:789-794. Aortic aneurysm NOS (ICD10-I71.9) 6.  Aortic Atherosclerois (ICD10-170.0) 7.  Emphysema. ([id number]) Electronically Signed   By: Misty Stanley M.D.   On: 06/18/2019 15:03   Ct Abdomen Pelvis W Contrast  Result Date: 06/18/2019 CLINICAL DATA:  Non-small-cell lung cancer.  Restaging. EXAM: CT CHEST, ABDOMEN, AND PELVIS WITH CONTRAST TECHNIQUE: Multidetector CT imaging of the chest, abdomen and pelvis was performed following the standard protocol during bolus administration of intravenous contrast. CONTRAST:  119mL OMNIPAQUE IOHEXOL 300 MG/ML  SOLN COMPARISON:  02/27/2019 FINDINGS: CT CHEST FINDINGS Cardiovascular: The heart size is normal. No substantial pericardial effusion. Coronary artery calcification is evident. Atherosclerotic calcification is noted in the wall of the thoracic aorta. Mediastinum/Nodes: No mediastinal lymphadenopathy. There is no hilar lymphadenopathy. The esophagus has normal imaging features. There is no axillary lymphadenopathy. Lungs/Pleura: Centrilobular and paraseptal emphysema evident. Bilateral pulmonary nodules again noted. Index right upper lobe nodule measured previously at 1.3 cm is now 0.9 cm (image 39/series 4). Posterior sub solid pulmonary nodule measured previouslyr at 10 mm is 7 mm today (55/4). The index irregular nodule in the posterior left upper lobe, adjacent to  fiducials, is similar at 1.4 x 0.5 cm today (62/4) compared to 1.3 x 0.9 cm previously. Remaining scattered tiny pulmonary nodules are stable. Atelectasis noted posterior left upper lobe on the previous exam has resolved. No new suspicious pulmonary nodule or mass. No pleural effusion. Musculoskeletal: No worrisome lytic or sclerotic osseous abnormality. Nonacute posterior left ninth rib fracture is new in the interval. CT ABDOMEN PELVIS FINDINGS Hepatobiliary: No suspicious focal abnormality within the liver parenchyma. There is no evidence for gallstones, gallbladder wall thickening, or pericholecystic fluid. No intrahepatic or extrahepatic biliary dilation. Pancreas: No focal mass lesion. No dilatation of the main duct. No intraparenchymal cyst. No peripancreatic edema. Spleen: No splenomegaly. No focal mass lesion. Adrenals/Urinary Tract: Similar nodular thickening left adrenal gland with 1.6 cm adrenal adenoma. Focal scarring noted upper pole right kidney. Kidneys otherwise unremarkable. No evidence for hydroureter. Question mild anterior bladder wall thickening (121/2). Stomach/Bowel: Stomach is unremarkable. No gastric wall thickening. No evidence of outlet obstruction. Duodenum is normally positioned as is the ligament of Treitz. No small bowel wall thickening. No small bowel dilatation. The terminal ileum is normal. The appendix is normal. Diverticular changes are noted in the left colon without evidence of diverticulitis. Vascular/Lymphatic: Atherosclerotic calcification noted in the abdominal aorta with infrarenal segment  measuring up to 3.3 cm diameter. There is no gastrohepatic or hepatoduodenal ligament lymphadenopathy. No intraperitoneal or retroperitoneal lymphadenopathy. No pelvic sidewall lymphadenopathy. Reproductive: The prostate gland and seminal vesicles are unremarkable. Other: No intraperitoneal free fluid. Musculoskeletal: Small bilateral groin hernias contain only fat. No worrisome lytic or  sclerotic osseous abnormality. IMPRESSION: 1. The bilateral index pulmonary nodules are stable or show continued further interval decrease in size. No new or progressive pulmonary nodule or mass identified on today's exam. Other scattered tiny pulmonary nodules are stable. 2. The posterior left upper lobe collapse/consolidation seen previously has resolved completely, compatible with atelectasis. 3. Stable left adrenal adenoma without evidence for metastatic disease in the abdomen or pelvis. 4. 4.2 cm aneurysm of the ascending thoracic aorta. Recommend annual imaging followup by CTA or MRA. This recommendation follows 2010 ACCF/AHA/AATS/ACR/ASA/SCA/SCAI/SIR/STS/SVM Guidelines for the Diagnosis and Management of Patients with Thoracic Aortic Disease. Circulation. 2010; 121: Z610-R604. Aortic aneurysm NOS (ICD10-I71.9) 5. 3.3 cm abdominal aortic aneurysm. Recommend followup by ultrasound in 3 years. This recommendation follows ACR consensus guidelines: White Paper of the ACR Incidental Findings Committee II on Vascular Findings. J Am Coll Radiol 2013; 10:789-794. Aortic aneurysm NOS (ICD10-I71.9) 6.  Aortic Atherosclerois (ICD10-170.0) 7.  Emphysema. ([id number]) Electronically Signed   By: Misty Stanley M.D.   On: 06/18/2019 15:03     ASSESSMENT/PLAN:  This is a very pleasant 68 year old Caucasian male with stage IV non-small cell lung cancer, squamous cell carcinoma. He presented with multiple bilateral pulmonary nodules. He was diagnosed in April 2018. He underwent systemic chemotherapy with carboplatin and paclitaxel. He is status post 6 cycles. He tolerated it well except for the development of persistent peripheral neuropathy. He was then on observation for 3 months. He showed signs of disease progression.   He is currently being treated with immunotherapy with nivolumab 480 mg IV every 4 weeks. He is status post 18 cycles.  He recently had a restaging CT scan performed.  Dr. Julien Nordmann personally  and independently reviewed the scan and discussed the results with the patient today. The scan did not show any concerning evidence for disease progression.  Dr. Julien Nordmann recommends that the patient continue on his current treatment.  He will receive cycle #19 today as scheduled.   We will see the patient back for a follow-up visit in 4 weeks for evaluation before starting cycle #20.  The patient will continue taking gabapentin and amitriptyline for his peripheral neuropathy.  The patient was advised to call immediately if he has any concerning symptoms in the interval. The patient voices understanding of current disease status and treatment options and is in agreement with the current care plan. All questions were answered. The patient knows to call the clinic with any problems, questions or concerns. We can certainly see the patient much sooner if necessary  No orders of the defined types were placed in this encounter.    Kaitlyne Friedhoff L Rosser [place], PA-C 06/21/19  ADDENDUM: Hematology/Oncology Attending: I had a face-to-face encounter with the patient today.  I recommended his care plan.  This is a very pleasant 68 years old white male with metastatic non-small cell lung cancer, squamous cell carcinoma status post induction systemic chemotherapy with carboplatin and paclitaxel for 6 cycles.  The patient was followed by observation but he developed disease progression and started on second line treatment with nivolumab 480 mg IV every 4 weeks status post 18 cycles.  He has been tolerating this treatment well with no concerning adverse effect except for the residual  peripheral neuropathy from his previous chemotherapy with carboplatin and paclitaxel. He is currently on gabapentin 600 mg p.o. 3 times daily for the peripheral neuropathy. The patient had repeat CT scan of the chest, abdomen pelvis performed recently.  I personally and independently reviewed the scans and discussed the results with the  patient today. Has a scan showed no concerning findings for disease progression. I recommended for the patient to continue his current treatment with nivolumab and he will proceed with cycle #19 today. I will see him back for follow-up visit in 4 weeks for evaluation before the next cycle of his treatment. He was advised to call immediately if he has any concerning symptoms in the interval.  Disclaimer: This note was dictated with voice recognition software. Similar sounding words can inadvertently be transcribed and may be missed upon review. Eilleen Kempf, MD 06/21/19

## 2019-07-02 ENCOUNTER — Telehealth: Payer: Self-pay | Admitting: Medical Oncology

## 2019-07-02 ENCOUNTER — Other Ambulatory Visit: Payer: Self-pay | Admitting: Internal Medicine

## 2019-07-02 DIAGNOSIS — G4700 Insomnia, unspecified: Secondary | ICD-10-CM

## 2019-07-02 DIAGNOSIS — C7800 Secondary malignant neoplasm of unspecified lung: Secondary | ICD-10-CM

## 2019-07-02 MED ORDER — TEMAZEPAM 30 MG PO CAPS: ORAL_CAPSULE | ORAL | 0 refills | Status: DC

## 2019-07-02 MED ORDER — MIRTAZAPINE 30 MG PO TABS: 30.0000 mg | ORAL_TABLET | Freq: Every day | ORAL | 1 refills | Status: DC

## 2019-07-02 NOTE — Telephone Encounter (Signed)
Refill request x 2

## 2019-07-02 NOTE — Telephone Encounter (Signed)
He cannot receive more than 1 refill at a time.  Thank you.

## 2019-07-02 NOTE — Telephone Encounter (Signed)
Pt instructed to take the mirtazapine 2 hours apart from restoril

## 2019-07-06 ENCOUNTER — Other Ambulatory Visit: Payer: Self-pay | Admitting: Internal Medicine

## 2019-07-06 ENCOUNTER — Telehealth: Payer: Self-pay | Admitting: *Deleted

## 2019-07-06 DIAGNOSIS — C7800 Secondary malignant neoplasm of unspecified lung: Secondary | ICD-10-CM

## 2019-07-06 DIAGNOSIS — G4700 Insomnia, unspecified: Secondary | ICD-10-CM

## 2019-07-06 NOTE — Telephone Encounter (Signed)
Received call from pt regarding refills for Mirtazipine and temazepam. Advised pt that both of those were called in to his pharmacy on 07/02/19. Advised pt to confirm with his pharmacy. Independently, I did call his pharmacy and the Temazepam was picked up on 07/04/19 and the mirtazipine is ready for pick up. TCT patient and advised of the above. He states his cargiver must have picked up the Temazepam. No further questions or concerns

## 2019-07-19 ENCOUNTER — Other Ambulatory Visit: Payer: Self-pay

## 2019-07-19 ENCOUNTER — Inpatient Hospital Stay: Payer: Medicare Other | Attending: Internal Medicine | Admitting: Physician Assistant

## 2019-07-19 ENCOUNTER — Inpatient Hospital Stay: Payer: Medicare Other

## 2019-07-19 VITALS — BP 123/94 | HR 93 | Temp 99.1°F | Resp 18 | Ht 68.0 in | Wt 188.6 lb

## 2019-07-19 DIAGNOSIS — G629 Polyneuropathy, unspecified: Secondary | ICD-10-CM | POA: Diagnosis not present

## 2019-07-19 DIAGNOSIS — R197 Diarrhea, unspecified: Secondary | ICD-10-CM | POA: Insufficient documentation

## 2019-07-19 DIAGNOSIS — C3490 Malignant neoplasm of unspecified part of unspecified bronchus or lung: Secondary | ICD-10-CM

## 2019-07-19 DIAGNOSIS — C7800 Secondary malignant neoplasm of unspecified lung: Secondary | ICD-10-CM

## 2019-07-19 DIAGNOSIS — Z5112 Encounter for antineoplastic immunotherapy: Secondary | ICD-10-CM | POA: Diagnosis present

## 2019-07-19 DIAGNOSIS — R5382 Chronic fatigue, unspecified: Secondary | ICD-10-CM

## 2019-07-19 DIAGNOSIS — C3411 Malignant neoplasm of upper lobe, right bronchus or lung: Secondary | ICD-10-CM | POA: Insufficient documentation

## 2019-07-19 DIAGNOSIS — Z79899 Other long term (current) drug therapy: Secondary | ICD-10-CM | POA: Diagnosis not present

## 2019-07-19 DIAGNOSIS — C3412 Malignant neoplasm of upper lobe, left bronchus or lung: Secondary | ICD-10-CM | POA: Diagnosis present

## 2019-07-19 LAB — CBC WITH DIFFERENTIAL (CANCER CENTER ONLY)
Abs Immature Granulocytes: 0.05 10*3/uL (ref 0.00–0.07)
Basophils Absolute: 0.1 10*3/uL (ref 0.0–0.1)
Basophils Relative: 1 %
Eosinophils Absolute: 0.2 10*3/uL (ref 0.0–0.5)
Eosinophils Relative: 1 %
HCT: 44.7 % (ref 39.0–52.0)
Hemoglobin: 14.4 g/dL (ref 13.0–17.0)
Immature Granulocytes: 0 %
Lymphocytes Relative: 32 %
Lymphs Abs: 3.9 10*3/uL (ref 0.7–4.0)
MCH: 29.7 pg (ref 26.0–34.0)
MCHC: 32.2 g/dL (ref 30.0–36.0)
MCV: 92.2 fL (ref 80.0–100.0)
Monocytes Absolute: 0.8 10*3/uL (ref 0.1–1.0)
Monocytes Relative: 7 %
Neutro Abs: 7.1 10*3/uL (ref 1.7–7.7)
Neutrophils Relative %: 59 %
Platelet Count: 258 10*3/uL (ref 150–400)
RBC: 4.85 MIL/uL (ref 4.22–5.81)
RDW: 14.3 % (ref 11.5–15.5)
WBC Count: 12 10*3/uL — ABNORMAL HIGH (ref 4.0–10.5)
nRBC: 0 % (ref 0.0–0.2)

## 2019-07-19 LAB — CMP (CANCER CENTER ONLY)
ALT: 9 U/L (ref 0–44)
AST: 12 U/L — ABNORMAL LOW (ref 15–41)
Albumin: 4.1 g/dL (ref 3.5–5.0)
Alkaline Phosphatase: 75 U/L (ref 38–126)
Anion gap: 10 (ref 5–15)
BUN: 15 mg/dL (ref 8–23)
CO2: 25 mmol/L (ref 22–32)
Calcium: 9.2 mg/dL (ref 8.9–10.3)
Chloride: 103 mmol/L (ref 98–111)
Creatinine: 0.93 mg/dL (ref 0.61–1.24)
GFR, Est AFR Am: >60 mL/min (ref >60)
GFR, Estimated: >60 mL/min (ref >60)
Glucose, Bld: 181 mg/dL — ABNORMAL HIGH (ref 70–99)
Potassium: 4.3 mmol/L (ref 3.5–5.1)
Sodium: 138 mmol/L (ref 135–145)
Total Bilirubin: 0.4 mg/dL (ref 0.3–1.2)
Total Protein: 7.6 g/dL (ref 6.5–8.1)

## 2019-07-19 LAB — TSH: TSH: 4.542 u[IU]/mL — ABNORMAL HIGH (ref 0.320–4.118)

## 2019-07-19 MED ORDER — HEPARIN SOD (PORK) LOCK FLUSH 100 UNIT/ML IV SOLN
500.0000 [IU] | Freq: Once | INTRAVENOUS | Status: DC | PRN
  Filled 2019-07-19: qty 5

## 2019-07-19 MED ORDER — SODIUM CHLORIDE 0.9 % IV SOLN
Freq: Once | INTRAVENOUS | Status: AC
  Administered 2019-07-19: 15:00:00 via INTRAVENOUS
  Filled 2019-07-19: qty 250

## 2019-07-19 MED ORDER — SODIUM CHLORIDE 0.9% FLUSH
10.0000 mL | INTRAVENOUS | Status: DC | PRN
  Filled 2019-07-19: qty 10

## 2019-07-19 MED ORDER — SODIUM CHLORIDE 0.9 % IV SOLN
480.0000 mg | Freq: Once | INTRAVENOUS | Status: AC
  Administered 2019-07-19: 480 mg via INTRAVENOUS
  Filled 2019-07-19: qty 48

## 2019-07-19 NOTE — Patient Instructions (Signed)
[facility] Discharge Instructions for Patients Receiving Chemotherapy  Today you received the following immunotherapy agents:  opdivo/Novolumab  To help prevent nausea and vomiting after your treatment, we encourage you to take your nausea medication as needed.  If you develop nausea and vomiting that is not controlled by your nausea medication, call the clinic.   BELOW ARE SYMPTOMS THAT SHOULD BE REPORTED IMMEDIATELY:  *FEVER GREATER THAN 100.5 F  *CHILLS WITH OR WITHOUT FEVER  NAUSEA AND VOMITING THAT IS NOT CONTROLLED WITH YOUR NAUSEA MEDICATION  *UNUSUAL SHORTNESS OF BREATH  *UNUSUAL BRUISING OR BLEEDING  TENDERNESS IN MOUTH AND THROAT WITH OR WITHOUT PRESENCE OF ULCERS  *URINARY PROBLEMS  *BOWEL PROBLEMS  UNUSUAL RASH Items with * indicate a potential emergency and should be followed up as soon as possible.  Feel free to call the clinic should you have any questions or concerns. The clinic phone number is [phone].  Please show the [facility] at check-in to the Emergency Department and triage nurse.

## 2019-07-19 NOTE — Progress Notes (Signed)
[place] OFFICE PROGRESS NOTE  System, Pcp Not In No address on file  DIAGNOSIS: Stage IV (T3, N0, M1 a) non-small cell lung cancer, squamous cell carcinoma presented with multiple bilateral pulmonary nodules diagnosed in April 2018.  PRIOR THERAPY: Systemic chemotherapy with carboplatin for AUC of 5 and paclitaxel 175 MG/M2 every 3 weeks with Neulasta support. Status post 6 cycles.  CURRENT THERAPY: Second line treatment with immunotherapy was Nivolumab 480 mg IV every 4 weeks. First cycle November 14, 2017. Status post 19cycles  INTERVAL HISTORY: [name] [name] 68 y.o. male returns to the clinic for a follow up visit. The patient is feeling well today without any concerning complaints except for mild fatigue. The patient continues to tolerate treatment with nivolulab well. The patient still reports occasional diarrhea. When the patient experiences symptoms, he has diarrhea about 2-3 times a day. The patient denies any abdominal pain or blood in the stool. The patient states that he takes imodium once per day as well as mesalamine which is prescribed by his gastroenterologist who is in his hometown of [place], [place]. Denies any fever, chills, night sweats, or weight loss. He actually gained a few pounds since his last visit after discussing recommendations with the nutrition team at the cancer center. He has been drinking ensure once per day. Denies any chest pain, shortness of breath, cough, or hemoptysis. Denies any nausea, vomiting, or constipation. Denies any headache or visual changes. Denies any rashes or skin changes. He reports his usual peripheral neuropathy for which he is prescribed amitriptyline and gabapentin.The patient is here today for evaluation prior to starting cycle # 20  MEDICAL HISTORY: Past Medical History:  Diagnosis Date  . Depression 01/29/2017  . Dyspnea   . Encounter for antineoplastic chemotherapy 01/29/2017  . Goals of care, counseling/discussion  01/29/2017  . Hypertension   . Metastatic squamous cell carcinoma to lung, unspecified laterality ([place]) 01/28/2017  . Stroke [facility])     ALLERGIES:  has No Known Allergies.  MEDICATIONS:  Current Outpatient Medications  Medication Sig Dispense Refill  . acetaminophen (TYLENOL) 325 MG tablet You can take 2 every 4 hours as needed.  You can buy it over the counter DO NOT TAKE MORE THAN 4000 MG OF TYLENOL PER DAY.  IT CAN HARM YOUR LIVER.    Marland Kitchen amitriptyline (ELAVIL) 100 MG tablet Take 1 tablet (100 mg total) by mouth at bedtime. 30 tablet 3  . aspirin EC 81 MG tablet Take 81 mg by mouth daily.    Marland Kitchen gabapentin (NEURONTIN) 300 MG capsule TAKE 2 CAPSULES BY MOUTH 3 TIMES DAILY 90 capsule 3  . loperamide (IMODIUM) 2 MG capsule Take 1 capsule (2 mg total) by mouth 2 (two) times daily. Also available over the counter. You can take 1 extra dose in the afternoon if diarrhea is not improving. 60 capsule 4  . mesalamine (APRISO) 0.375 g 24 hr capsule Take 4 capsules by mouth daily.    . metoprolol succinate (TOPROL-XL) 25 MG 24 hr tablet Take 1 tablet (25 mg total) by mouth daily. 30 tablet 4  . mirtazapine (REMERON) 30 MG tablet Take 1 tablet (30 mg total) by mouth at bedtime. 30 tablet 1  . nortriptyline (PAMELOR) 10 MG capsule Take 10 mg by mouth 2 (two) times daily.     . pantoprazole (PROTONIX) 40 MG tablet Take 1 tablet (40 mg total) by mouth 2 (two) times daily. 60 tablet 1  . simvastatin (ZOCOR) 20 MG tablet Take 20  mg by mouth daily.    . temazepam (RESTORIL) 30 MG capsule 1 tablet p.o. nightly as needed for sleep. 30 capsule 0   No current facility-administered medications for this visit.    Facility-Administered Medications Ordered in Other Visits  Medication Dose Route Frequency Provider Last Rate Last Dose  . heparin lock flush 100 unit/mL  500 Units Intracatheter Once PRN [name], MD      . sodium chloride flush (NS) 0.9 % injection 10 mL  10 mL Intracatheter PRN [name],  MD        SURGICAL HISTORY:  Past Surgical History:  Procedure Laterality Date  . BACK SURGERY     cyst removal  . COLONOSCOPY    . LAPAROTOMY N/A 02/25/2017   Procedure: EXPLORATORY LAPAROTOMY MODIFIED GRAHAM'S PATCH;  Surgeon: Kinsinger, Arta Bruce, MD;  Location: [facility];  Service: General;  Laterality: N/A;  . MINOR PLACEMENT OF FIDUCIAL N/A 01/19/2017   Procedure: MINOR PLACEMENT OF FIDUCIAL x 6;  Surgeon: Collene Gobble, MD;  Location: [place];  Service: Thoracic;  Laterality: N/A;  . VIDEO BRONCHOSCOPY WITH ENDOBRONCHIAL NAVIGATION N/A 01/19/2017   Procedure: VIDEO BRONCHOSCOPY WITH ENDOBRONCHIAL NAVIGATION;  Surgeon: Collene Gobble, MD;  Location: [place];  Service: Thoracic;  Laterality: N/A;    REVIEW OF SYSTEMS:   Review of Systems  Constitutional: Positive for fatigue. Negative for appetite change, chills, fever and unexpected weight change.  HENT: Negative for mouth sores, nosebleeds, sore throat and trouble swallowing.   Eyes: Negative for eye problems and icterus.  Respiratory: Negative for cough, hemoptysis, shortness of breath and wheezing.   Cardiovascular: Negative for chest pain and leg swelling.  Gastrointestinal: Positive for occasional diarrhea 2-3x per day. Negative for abdominal pain, constipation, nausea and vomiting.  Genitourinary: Negative for bladder incontinence, difficulty urinating, dysuria, frequency and hematuria.   Musculoskeletal: Negative for back pain, gait problem, neck pain and neck stiffness.  Skin: Negative for itching and rash.  Neurological: Positive for peripheral neuropathy. Negative for dizziness, extremity weakness, gait problem, headaches, light-headedness and seizures.  Hematological: Negative for adenopathy. Does not bruise/bleed easily.  Psychiatric/Behavioral: Negative for confusion, depression and sleep disturbance. The patient is not nervous/anxious.     PHYSICAL EXAMINATION:  Blood pressure (!) 123/94, pulse 93, temperature 99.1 F  (37.3 C), temperature source Oral, resp. rate 18, height 5\' 8"  (1.727 m), weight 188 lb 9.6 oz (85.5 kg), SpO2 96 %.  ECOG PERFORMANCE STATUS: 1 - Symptomatic but completely ambulatory  Physical Exam  Constitutional: Oriented to person, place, and time and well-developed, well-nourished, and in no distress. No distress.  HENT:  Head: Normocephalic and atraumatic.  Mouth/Throat: Oropharynx is clear and moist. No oropharyngeal exudate.  Eyes: Conjunctivae are normal. Right eye exhibits no discharge. Left eye exhibits no discharge. No scleral icterus.  Neck: Normal range of motion. Neck supple.  Cardiovascular: Normal rate, regular rhythm, normal heart sounds and intact distal pulses.   Pulmonary/Chest: Effort normal. Decreased breath sounds in all lung fields. No respiratory distress. No wheezes. No rales.  Abdominal: Soft. Bowel sounds are normal. Exhibits no distension and no mass. There is no tenderness.  Musculoskeletal: Normal range of motion. Exhibits no edema.  Lymphadenopathy:    No cervical adenopathy.  Neurological: Alert and oriented to person, place, and time. Exhibits normal muscle tone. Gait normal. Coordination normal.  Skin: Skin is warm and dry. No rash noted. Not diaphoretic. No erythema. No pallor.  Psychiatric: Mood, memory and judgment normal.  Vitals reviewed.  LABORATORY DATA: Lab Results  Component Value Date   WBC 12.0 (H) 07/19/2019   HGB 14.4 07/19/2019   HCT 44.7 07/19/2019   MCV 92.2 07/19/2019   PLT 258 07/19/2019      Chemistry      Component Value Date/Time   NA 138 07/19/2019 1309   NA 139 10/06/2017 1052   K 4.3 07/19/2019 1309   K 4.2 10/06/2017 1052   CL 103 07/19/2019 1309   CO2 25 07/19/2019 1309   CO2 25 10/06/2017 1052   BUN 15 07/19/2019 1309   BUN 13.2 10/06/2017 1052   CREATININE 0.93 07/19/2019 1309   CREATININE 0.9 10/06/2017 1052      Component Value Date/Time   CALCIUM 9.2 07/19/2019 1309   CALCIUM 9.5 10/06/2017 1052    ALKPHOS 75 07/19/2019 1309   ALKPHOS 68 10/06/2017 1052   AST 12 (L) 07/19/2019 1309   AST 14 10/06/2017 1052   ALT 9 07/19/2019 1309   ALT 13 10/06/2017 1052   BILITOT 0.4 07/19/2019 1309   BILITOT 0.44 10/06/2017 1052       RADIOGRAPHIC STUDIES:  No results found.   ASSESSMENT/PLAN:  This is a very pleasant 68 year old Caucasian male with stage IV non-small cell lung cancer, squamous cell carcinoma. He presented with multiple bilateral pulmonary nodules. He was diagnosed in April 2018. He underwent systemic chemotherapy with carboplatin and paclitaxel. He is status post 6 cycles. He tolerated it well except for the development of persistent peripheral neuropathy. He was then on observation for 3 months. He showed signs of disease progression.   He is currently being treated with immunotherapy with nivolumab 480 mg IV every 4 weeks. He is status post 19cycles.  Labs were reviewed with the patient. I recommend that he proceed with cycle #20 today as scheduled.   We will see the patient back for a follow-up visit in 4 weeks for evaluation before starting cycle #21.  The patient will continue taking gabapentin and amitriptyline for his peripheral neuropathy.  I reviewed how to take imodium with the patient for better control of his diarrhea. He will continue with imodium and mesalamine.  He will continue to follow the advise from the nutritionist team here at the cancer center and will continue to increase his calorie intake and drink ensure.  The patient was advised to call immediately if he has any concerning symptoms in the interval. The patient voices understanding of current disease status and treatment options and is in agreement with the current care plan. All questions were answered. The patient knows to call the clinic with any problems, questions or concerns. We can certainly see the patient much sooner if necessary No orders of the defined types were placed in  this encounter.    Karrington Studnicka L Kanaya Gunnarson, PA-C 07/19/19

## 2019-07-20 ENCOUNTER — Telehealth: Payer: Self-pay | Admitting: Internal Medicine

## 2019-07-20 NOTE — Telephone Encounter (Signed)
Scheduled appt per 10/1 los - pt to get an updated schedule next visit.

## 2019-07-31 ENCOUNTER — Other Ambulatory Visit: Payer: Self-pay | Admitting: Internal Medicine

## 2019-07-31 DIAGNOSIS — G4700 Insomnia, unspecified: Secondary | ICD-10-CM

## 2019-07-31 DIAGNOSIS — C7800 Secondary malignant neoplasm of unspecified lung: Secondary | ICD-10-CM

## 2019-07-31 MED ORDER — TEMAZEPAM 30 MG PO CAPS: ORAL_CAPSULE | ORAL | 0 refills | Status: DC

## 2019-08-16 ENCOUNTER — Inpatient Hospital Stay: Payer: Medicare Other

## 2019-08-16 ENCOUNTER — Other Ambulatory Visit: Payer: Self-pay

## 2019-08-16 ENCOUNTER — Inpatient Hospital Stay (HOSPITAL_BASED_OUTPATIENT_CLINIC_OR_DEPARTMENT_OTHER): Payer: Medicare Other | Admitting: Physician Assistant

## 2019-08-16 VITALS — BP 142/96 | HR 80 | Temp 98.2°F | Resp 18 | Ht 68.0 in | Wt 192.3 lb

## 2019-08-16 DIAGNOSIS — Z5112 Encounter for antineoplastic immunotherapy: Secondary | ICD-10-CM

## 2019-08-16 DIAGNOSIS — C7800 Secondary malignant neoplasm of unspecified lung: Secondary | ICD-10-CM | POA: Diagnosis not present

## 2019-08-16 DIAGNOSIS — R5382 Chronic fatigue, unspecified: Secondary | ICD-10-CM

## 2019-08-16 DIAGNOSIS — C3490 Malignant neoplasm of unspecified part of unspecified bronchus or lung: Secondary | ICD-10-CM

## 2019-08-16 LAB — CMP (CANCER CENTER ONLY)
ALT: 10 U/L (ref 0–44)
AST: 13 U/L — ABNORMAL LOW (ref 15–41)
Albumin: 4 g/dL (ref 3.5–5.0)
Alkaline Phosphatase: 82 U/L (ref 38–126)
Anion gap: 10 (ref 5–15)
BUN: 10 mg/dL (ref 8–23)
CO2: 27 mmol/L (ref 22–32)
Calcium: 9.2 mg/dL (ref 8.9–10.3)
Chloride: 101 mmol/L (ref 98–111)
Creatinine: 0.92 mg/dL (ref 0.61–1.24)
GFR, Est AFR Am: >60 mL/min (ref >60)
GFR, Estimated: >60 mL/min (ref >60)
Glucose, Bld: 99 mg/dL (ref 70–99)
Potassium: 4.2 mmol/L (ref 3.5–5.1)
Sodium: 138 mmol/L (ref 135–145)
Total Bilirubin: 0.3 mg/dL (ref 0.3–1.2)
Total Protein: 7.8 g/dL (ref 6.5–8.1)

## 2019-08-16 LAB — CBC WITH DIFFERENTIAL (CANCER CENTER ONLY)
Abs Immature Granulocytes: 0.03 10*3/uL (ref 0.00–0.07)
Basophils Absolute: 0.1 10*3/uL (ref 0.0–0.1)
Basophils Relative: 1 %
Eosinophils Absolute: 0.2 10*3/uL (ref 0.0–0.5)
Eosinophils Relative: 2 %
HCT: 44.4 % (ref 39.0–52.0)
Hemoglobin: 14.6 g/dL (ref 13.0–17.0)
Immature Granulocytes: 0 %
Lymphocytes Relative: 40 %
Lymphs Abs: 4.6 10*3/uL — ABNORMAL HIGH (ref 0.7–4.0)
MCH: 29.8 pg (ref 26.0–34.0)
MCHC: 32.9 g/dL (ref 30.0–36.0)
MCV: 90.6 fL (ref 80.0–100.0)
Monocytes Absolute: 0.8 10*3/uL (ref 0.1–1.0)
Monocytes Relative: 7 %
Neutro Abs: 5.8 10*3/uL (ref 1.7–7.7)
Neutrophils Relative %: 50 %
Platelet Count: 314 10*3/uL (ref 150–400)
RBC: 4.9 MIL/uL (ref 4.22–5.81)
RDW: 13 % (ref 11.5–15.5)
WBC Count: 11.4 10*3/uL — ABNORMAL HIGH (ref 4.0–10.5)
nRBC: 0 % (ref 0.0–0.2)

## 2019-08-16 LAB — TSH: TSH: 4.187 u[IU]/mL — ABNORMAL HIGH (ref 0.320–4.118)

## 2019-08-16 MED ORDER — SODIUM CHLORIDE 0.9 % IV SOLN
Freq: Once | INTRAVENOUS | Status: AC
  Administered 2019-08-16: 16:00:00 via INTRAVENOUS
  Filled 2019-08-16: qty 250

## 2019-08-16 MED ORDER — SODIUM CHLORIDE 0.9 % IV SOLN
480.0000 mg | Freq: Once | INTRAVENOUS | Status: AC
  Administered 2019-08-16: 17:00:00 480 mg via INTRAVENOUS
  Filled 2019-08-16: qty 48

## 2019-08-16 NOTE — Patient Instructions (Signed)
[facility] Discharge Instructions for Patients Receiving Chemotherapy  Today you received the following immunotherapy agents:  opdivo/Novolumab  To help prevent nausea and vomiting after your treatment, we encourage you to take your nausea medication as needed.  If you develop nausea and vomiting that is not controlled by your nausea medication, call the clinic.   BELOW ARE SYMPTOMS THAT SHOULD BE REPORTED IMMEDIATELY:  *FEVER GREATER THAN 100.5 F  *CHILLS WITH OR WITHOUT FEVER  NAUSEA AND VOMITING THAT IS NOT CONTROLLED WITH YOUR NAUSEA MEDICATION  *UNUSUAL SHORTNESS OF BREATH  *UNUSUAL BRUISING OR BLEEDING  TENDERNESS IN MOUTH AND THROAT WITH OR WITHOUT PRESENCE OF ULCERS  *URINARY PROBLEMS  *BOWEL PROBLEMS  UNUSUAL RASH Items with * indicate a potential emergency and should be followed up as soon as possible.  Feel free to call the clinic should you have any questions or concerns. The clinic phone number is [phone].  Please show the [facility] at check-in to the Emergency Department and triage nurse.

## 2019-08-16 NOTE — Progress Notes (Signed)
[place] OFFICE PROGRESS NOTE  System, Pcp Not In No address on file  DIAGNOSIS: Stage IV (T3, N0, M1 a) non-small cell lung cancer, squamous cell carcinoma presented with multiple bilateral pulmonary nodules diagnosed in April 2018.  PRIOR THERAPY: Systemic chemotherapy with carboplatin for AUC of 5 and paclitaxel 175 MG/M2 every 3 weeks with Neulasta support. Status post 6 cycles.  CURRENT THERAPY: Second line treatment with immunotherapy was Nivolumab 480 mg IV every 4 weeks. First cycle November 14, 2017. Status post 20cycles  INTERVAL HISTORY: [name] [name] 67 y.o. male returns to the clinic for a follow up visit. The patent denies any new symptoms or complaints today except for what appears to be olecranon bursitis of his left elbow. He said it occurred about a month ago after he was leaning on his elbow for a prolonged period of time. He has seen an orthopedic doctor in his home town for this concern.   Otherwise, the patient continues to tolerate treatment with nivolulab well. The patient still reports occasional diarrhea. When the patient experiences symptoms, he has diarrhea about 2-3 times a day. The patient denies any abdominal pain or blood in the stool. The patient states that he takes imodium once per day as well as mesalamine which is prescribed by his gastroenterologist who is in his hometown of [place], [place]. Denies any fever, chills, night sweats, or weight loss. He actually gained a few more pounds since his last visit. He has been drinking ensure once per day. Denies any chest pain, cough, or hemoptysis. The patient feels that he was more short of breath last night but states that he feels at his baseline today. Denies swelling or calf tenderness. Denies any nausea, vomiting, or constipation. Denies any headache or visual changes. Denies any rashes or skin changes. He reports his usual peripheral neuropathy for which he is prescribed amitriptyline and  gabapentin.The patient is here today for evaluation prior to starting cycle # 21  MEDICAL HISTORY: Past Medical History:  Diagnosis Date  . Depression 01/29/2017  . Dyspnea   . Encounter for antineoplastic chemotherapy 01/29/2017  . Goals of care, counseling/discussion 01/29/2017  . Hypertension   . Metastatic squamous cell carcinoma to lung, unspecified laterality ([place]) 01/28/2017  . Stroke [facility])     ALLERGIES:  has No Known Allergies.  MEDICATIONS:  Current Outpatient Medications  Medication Sig Dispense Refill  . acetaminophen (TYLENOL) 325 MG tablet You can take 2 every 4 hours as needed.  You can buy it over the counter DO NOT TAKE MORE THAN 4000 MG OF TYLENOL PER DAY.  IT CAN HARM YOUR LIVER.    Marland Kitchen amitriptyline (ELAVIL) 100 MG tablet Take 1 tablet (100 mg total) by mouth at bedtime. 30 tablet 3  . aspirin EC 81 MG tablet Take 81 mg by mouth daily.    Marland Kitchen gabapentin (NEURONTIN) 300 MG capsule TAKE 2 CAPSULES BY MOUTH 3 TIMES DAILY 90 capsule 3  . loperamide (IMODIUM) 2 MG capsule Take 1 capsule (2 mg total) by mouth 2 (two) times daily. Also available over the counter. You can take 1 extra dose in the afternoon if diarrhea is not improving. 60 capsule 4  . mesalamine (APRISO) 0.375 g 24 hr capsule Take 4 capsules by mouth daily.    . metoprolol succinate (TOPROL-XL) 25 MG 24 hr tablet Take 1 tablet (25 mg total) by mouth daily. 30 tablet 4  . mirtazapine (REMERON) 30 MG tablet Take 1 tablet (30 mg total)  by mouth at bedtime. 30 tablet 1  . nortriptyline (PAMELOR) 10 MG capsule Take 10 mg by mouth 2 (two) times daily.     . pantoprazole (PROTONIX) 40 MG tablet Take 1 tablet (40 mg total) by mouth 2 (two) times daily. 60 tablet 1  . simvastatin (ZOCOR) 20 MG tablet Take 20 mg by mouth daily.    . temazepam (RESTORIL) 30 MG capsule TAKE 1 CAPSULE BY MOUTH NIGHTLY AS NEEDED FOR SLEEP 30 capsule 0   No current facility-administered medications for this visit.    Facility-Administered  Medications Ordered in Other Visits  Medication Dose Route Frequency Provider Last Rate Last Dose  . nivolumab (OPDIVO) 480 mg in sodium chloride 0.9 % 100 mL chemo infusion  480 mg Intravenous Once [name], MD        SURGICAL HISTORY:  Past Surgical History:  Procedure Laterality Date  . BACK SURGERY     cyst removal  . COLONOSCOPY    . LAPAROTOMY N/A 02/25/2017   Procedure: EXPLORATORY LAPAROTOMY MODIFIED GRAHAM'S PATCH;  Surgeon: Kinsinger, Arta Bruce, MD;  [facility];  Service: General;  Laterality: N/A;  . MINOR PLACEMENT OF FIDUCIAL N/A 01/19/2017   Procedure: MINOR PLACEMENT OF FIDUCIAL x 6;  Surgeon: Collene Gobble, MD;  Location: [place];  Service: Thoracic;  Laterality: N/A;  . VIDEO BRONCHOSCOPY WITH ENDOBRONCHIAL NAVIGATION N/A 01/19/2017   Procedure: VIDEO BRONCHOSCOPY WITH ENDOBRONCHIAL NAVIGATION;  Surgeon: Collene Gobble, MD;  Location: [place];  Service: Thoracic;  Laterality: N/A;    REVIEW OF SYSTEMS:   Review of Systems  Constitutional: Negative for appetite change, chills, fatigue, fever and unexpected weight change.  HENT: Negative for mouth sores, nosebleeds, sore throat and trouble swallowing.   Eyes: Negative for eye problems and icterus.  Respiratory: Negative for cough, hemoptysis, shortness of breath and wheezing.   Cardiovascular: Negative for chest pain and leg swelling.  Gastrointestinal: Positive for occasional diarrhea 2-3x per day. Negative for abdominal pain, constipation, nausea and vomiting.  Genitourinary: Negative for bladder incontinence, difficulty urinating, dysuria, frequency and hematuria.   Musculoskeletal: Positive for swelling of the left elbow. Denies changes with ROM or significant pain. Denies heat or erythema. Negative for back pain, gait problem, neck pain and neck stiffness.  Skin: Negative for itching and rash.  Neurological: Negative for dizziness, extremity weakness, gait problem, headaches, light-headedness and seizures.   Hematological: Negative for adenopathy. Does not bruise/bleed easily.  Psychiatric/Behavioral: Negative for confusion, depression and sleep disturbance. The patient is not nervous/anxious.     PHYSICAL EXAMINATION:  Blood pressure (!) 142/96, pulse 80, temperature 98.2 F (36.8 C), temperature source Temporal, resp. rate 18, height 5\' 8"  (1.727 m), weight 192 lb 4.8 oz (87.2 kg), SpO2 94 %.  ECOG PERFORMANCE STATUS: 1 - Symptomatic but completely ambulatory  Physical Exam  Constitutional: Oriented to person, place, and time and well-developed, well-nourished, and in no distress.  HENT:  Head: Normocephalic and atraumatic.  Mouth/Throat: Oropharynx is clear and moist. No oropharyngeal exudate.  Eyes: Conjunctivae are normal. Right eye exhibits no discharge. Left eye exhibits no discharge. No scleral icterus.  Neck: Normal range of motion. Neck supple.  Cardiovascular: Normal rate, regular rhythm, normal heart sounds and intact distal pulses.   Pulmonary/Chest: Effort normal. Decreased breath sounds in all lung fields. No respiratory distress. No wheezes. No rales.  Abdominal: Soft. Bowel sounds are normal. Exhibits no distension and no mass. There is no tenderness.  Musculoskeletal: Positive for swelling of the left  elbow. Normal range of motion. Exhibits no extremity edema.  Lymphadenopathy:    No cervical adenopathy.  Neurological: Alert and oriented to person, place, and time. Exhibits normal muscle tone. Gait normal. Coordination normal.  Skin: Skin is warm and dry. No rash noted. Not diaphoretic. No erythema. No pallor.  Psychiatric: Mood, memory and judgment normal.  Vitals reviewed.  LABORATORY DATA: Lab Results  Component Value Date   WBC 11.4 (H) 08/16/2019   HGB 14.6 08/16/2019   HCT 44.4 08/16/2019   MCV 90.6 08/16/2019   PLT 314 08/16/2019      Chemistry      Component Value Date/Time   NA 138 08/16/2019 1336   NA 139 10/06/2017 1052   K 4.2 08/16/2019 1336    K 4.2 10/06/2017 1052   CL 101 08/16/2019 1336   CO2 27 08/16/2019 1336   CO2 25 10/06/2017 1052   BUN 10 08/16/2019 1336   BUN 13.2 10/06/2017 1052   CREATININE 0.92 08/16/2019 1336   CREATININE 0.9 10/06/2017 1052      Component Value Date/Time   CALCIUM 9.2 08/16/2019 1336   CALCIUM 9.5 10/06/2017 1052   ALKPHOS 82 08/16/2019 1336   ALKPHOS 68 10/06/2017 1052   AST 13 (L) 08/16/2019 1336   AST 14 10/06/2017 1052   ALT 10 08/16/2019 1336   ALT 13 10/06/2017 1052   BILITOT 0.3 08/16/2019 1336   BILITOT 0.44 10/06/2017 1052       RADIOGRAPHIC STUDIES:  No results found.   ASSESSMENT/PLAN:  This is a very pleasant 68 year old Caucasian male with stage IV non-small cell lung cancer, squamous cell carcinoma. He presented with multiple bilateral pulmonary nodules. He was diagnosed in April 2018. He underwent systemic chemotherapy with carboplatin and paclitaxel. He is status post 6 cycles. He tolerated it well except for the development of persistent peripheral neuropathy. He was then on observation for 3 months. He showed signs of disease progression.   He is currently being treated with immunotherapy with nivolumab 480 mg IV every 4 weeks. He is status post 20cycles.  Labs were reviewed with the patient. I recommend that he proceed with cycle #21 today as scheduled.   We will see him back for a follow up visit in 4 weeks for evaluation before starting cycle #22.   The patient will continue taking gabapentin and amitriptyline for his peripheral neuropathy.  I reviewed how to take imodium with the patient for better control of his diarrhea. He will continue with imodium and mesalamine.  The patient was given a handout on olecranon bursitis and will follow up with his orthopedic doctor if it worsens or fails to improve.   We will order a restaging CT scan at the patient's next visit.   We will continue to monitor the patient's TSH closely.   The patient was advised  to call immediately if he has any concerning symptoms in the interval. The patient voices understanding of current disease status and treatment options and is in agreement with the current care plan. All questions were answered. The patient knows to call the clinic with any problems, questions or concerns. We can certainly see the patient much sooner if necessary  No orders of the defined types were placed in this encounter.    Cassandra L Heilingoetter, PA-C 08/16/19

## 2019-08-16 NOTE — Patient Instructions (Signed)
Elbow Bursitis  Bursitis is swelling and pain at the tip of the elbow. This happens when fluid builds up in a sac under the skin (bursa). This may also be called olecranon bursitis. What are the causes? Elbow bursitis may be caused by:  Elbow injury, such as falling onto the elbow.  Leaning on hard surfaces for long periods of time.  Infection from an injury that breaks the skin near the elbow.  A bone growth (spur) that forms at the tip of the elbow.  A medical condition that causes inflammation, such as gout or rheumatoid arthritis. Sometimes the cause is not known. What are the signs or symptoms? The first sign of elbow bursitis is usually swelling at the tip of the elbow. This can grow to be about the size of a golf ball. Swelling may start suddenly or develop gradually. Other symptoms may include:  Pain when bending or leaning on the elbow.  Not being able to move the elbow normally. If bursitis is caused by an infection, you may have:  Redness, warmth, and tenderness of the elbow.  Drainage of pus from the swollen area over the elbow, if the skin breaks open. How is this diagnosed? This condition may be diagnosed based on:  Your symptoms and medical history.  Any recent injuries you have had.  A physical exam.  X-rays to check for a bone spur or fracture.  Draining fluid from the bursa to test it for infection.  Blood tests to rule out gout or rheumatoid arthritis. How is this treated? Treatment for elbow bursitis depends on the cause. Treatment may include:  Medicines. These may include: ? Over-the-counter medicines to relieve pain and inflammation. ? Antibiotic medicines. ? Injections of anti-inflammatory medicines (steroids).  Draining fluid from the bursa.  Wrapping your elbow with a bandage.  Wearing elbow pads. If these treatments do not help, you may need surgery to remove the bursa. Follow these instructions at home: Medicines  Take  over-the-counter and prescription medicines only as told by your health care provider.  If you were prescribed an antibiotic medicine, take it as told by your health care provider. Do not stop taking the antibiotic even if you start to feel better. Managing pain, stiffness, and swelling   If directed, put ice on your elbow: ? Put ice in a plastic bag. ? Place a towel between your skin and the bag. ? Leave the ice on for 20 minutes, 2-3 times a day.  If your bursitis is caused by an injury, rest your elbow and wear your bandage as told by your health care provider.  Use elbow pads or elbow wraps to cushion your elbow as needed. General instructions  Avoid any activities that cause elbow pain. Ask your health care provider what activities are safe for you.  Keep all follow-up visits as told by your health care provider. This is important. Contact a health care provider if you have:  A fever.  Symptoms that do not get better with treatment.  Pain or swelling that: ? Gets worse. ? Goes away and then comes back.  Pus draining from your elbow. Get help right away if you have:  Trouble moving your arm, hand, or fingers. Summary  Elbow bursitis is inflammation of the fluid-filled sac (bursa) between the tip of your elbow bone (olecranon) and your skin.  Treatment for elbow bursitis depends on the cause. It may include medicines to relieve pain and inflammation, antibiotic medicines, and draining fluid from your elbow.  Contact a health care provider if your symptoms do not get better with treatment, or if your symptoms go away and then come back. This information is not intended to replace advice given to you by your health care provider. Make sure you discuss any questions you have with your health care provider. Document Released: 11/03/2006 Document Revised: 09/16/2017 Document Reviewed: 09/13/2017 Elsevier Patient Education  2020 [employer].

## 2019-08-28 ENCOUNTER — Other Ambulatory Visit: Payer: Self-pay | Admitting: Internal Medicine

## 2019-08-28 DIAGNOSIS — G4700 Insomnia, unspecified: Secondary | ICD-10-CM

## 2019-08-28 DIAGNOSIS — C7800 Secondary malignant neoplasm of unspecified lung: Secondary | ICD-10-CM

## 2019-08-29 ENCOUNTER — Other Ambulatory Visit: Payer: Self-pay | Admitting: Internal Medicine

## 2019-08-29 DIAGNOSIS — C7800 Secondary malignant neoplasm of unspecified lung: Secondary | ICD-10-CM

## 2019-08-29 DIAGNOSIS — Z5111 Encounter for antineoplastic chemotherapy: Secondary | ICD-10-CM

## 2019-08-29 DIAGNOSIS — C7931 Secondary malignant neoplasm of brain: Secondary | ICD-10-CM

## 2019-08-31 ENCOUNTER — Other Ambulatory Visit: Payer: Self-pay | Admitting: Internal Medicine

## 2019-08-31 DIAGNOSIS — C7931 Secondary malignant neoplasm of brain: Secondary | ICD-10-CM

## 2019-08-31 DIAGNOSIS — Z5111 Encounter for antineoplastic chemotherapy: Secondary | ICD-10-CM

## 2019-08-31 DIAGNOSIS — C7800 Secondary malignant neoplasm of unspecified lung: Secondary | ICD-10-CM

## 2019-09-12 ENCOUNTER — Other Ambulatory Visit: Payer: Self-pay

## 2019-09-12 ENCOUNTER — Inpatient Hospital Stay: Payer: Medicare Other

## 2019-09-12 ENCOUNTER — Inpatient Hospital Stay: Payer: Medicare Other | Attending: Internal Medicine

## 2019-09-12 ENCOUNTER — Inpatient Hospital Stay (HOSPITAL_BASED_OUTPATIENT_CLINIC_OR_DEPARTMENT_OTHER): Payer: Medicare Other | Admitting: Internal Medicine

## 2019-09-12 ENCOUNTER — Encounter: Payer: Self-pay | Admitting: Internal Medicine

## 2019-09-12 VITALS — BP 154/93 | HR 76 | Temp 98.5°F | Resp 20 | Ht 68.0 in | Wt 195.5 lb

## 2019-09-12 DIAGNOSIS — G629 Polyneuropathy, unspecified: Secondary | ICD-10-CM | POA: Diagnosis not present

## 2019-09-12 DIAGNOSIS — I10 Essential (primary) hypertension: Secondary | ICD-10-CM

## 2019-09-12 DIAGNOSIS — C7931 Secondary malignant neoplasm of brain: Secondary | ICD-10-CM

## 2019-09-12 DIAGNOSIS — C3411 Malignant neoplasm of upper lobe, right bronchus or lung: Secondary | ICD-10-CM | POA: Diagnosis present

## 2019-09-12 DIAGNOSIS — C3490 Malignant neoplasm of unspecified part of unspecified bronchus or lung: Secondary | ICD-10-CM

## 2019-09-12 DIAGNOSIS — R5382 Chronic fatigue, unspecified: Secondary | ICD-10-CM

## 2019-09-12 DIAGNOSIS — C3412 Malignant neoplasm of upper lobe, left bronchus or lung: Secondary | ICD-10-CM | POA: Insufficient documentation

## 2019-09-12 DIAGNOSIS — Z7982 Long term (current) use of aspirin: Secondary | ICD-10-CM | POA: Diagnosis not present

## 2019-09-12 DIAGNOSIS — C7800 Secondary malignant neoplasm of unspecified lung: Secondary | ICD-10-CM

## 2019-09-12 DIAGNOSIS — Z8673 Personal history of transient ischemic attack (TIA), and cerebral infarction without residual deficits: Secondary | ICD-10-CM | POA: Insufficient documentation

## 2019-09-12 DIAGNOSIS — Z5112 Encounter for antineoplastic immunotherapy: Secondary | ICD-10-CM | POA: Diagnosis present

## 2019-09-12 DIAGNOSIS — R0609 Other forms of dyspnea: Secondary | ICD-10-CM | POA: Insufficient documentation

## 2019-09-12 DIAGNOSIS — F329 Major depressive disorder, single episode, unspecified: Secondary | ICD-10-CM | POA: Insufficient documentation

## 2019-09-12 DIAGNOSIS — Z79899 Other long term (current) drug therapy: Secondary | ICD-10-CM | POA: Diagnosis not present

## 2019-09-12 LAB — CBC WITH DIFFERENTIAL (CANCER CENTER ONLY)
Abs Immature Granulocytes: 0.03 10*3/uL (ref 0.00–0.07)
Basophils Absolute: 0.1 10*3/uL (ref 0.0–0.1)
Basophils Relative: 1 %
Eosinophils Absolute: 0.3 10*3/uL (ref 0.0–0.5)
Eosinophils Relative: 3 %
HCT: 44.5 % (ref 39.0–52.0)
Hemoglobin: 14.2 g/dL (ref 13.0–17.0)
Immature Granulocytes: 0 %
Lymphocytes Relative: 41 %
Lymphs Abs: 4 10*3/uL (ref 0.7–4.0)
MCH: 29.1 pg (ref 26.0–34.0)
MCHC: 31.9 g/dL (ref 30.0–36.0)
MCV: 91.2 fL (ref 80.0–100.0)
Monocytes Absolute: 0.7 10*3/uL (ref 0.1–1.0)
Monocytes Relative: 8 %
Neutro Abs: 4.6 10*3/uL (ref 1.7–7.7)
Neutrophils Relative %: 47 %
Platelet Count: 247 10*3/uL (ref 150–400)
RBC: 4.88 MIL/uL (ref 4.22–5.81)
RDW: 13.2 % (ref 11.5–15.5)
WBC Count: 9.7 10*3/uL (ref 4.0–10.5)
nRBC: 0 % (ref 0.0–0.2)

## 2019-09-12 LAB — CMP (CANCER CENTER ONLY)
ALT: 12 U/L (ref 0–44)
AST: 16 U/L (ref 15–41)
Albumin: 3.9 g/dL (ref 3.5–5.0)
Alkaline Phosphatase: 71 U/L (ref 38–126)
Anion gap: 10 (ref 5–15)
BUN: 10 mg/dL (ref 8–23)
CO2: 26 mmol/L (ref 22–32)
Calcium: 8.9 mg/dL (ref 8.9–10.3)
Chloride: 103 mmol/L (ref 98–111)
Creatinine: 0.86 mg/dL (ref 0.61–1.24)
GFR, Est AFR Am: >60 mL/min (ref >60)
GFR, Estimated: >60 mL/min (ref >60)
Glucose, Bld: 109 mg/dL — ABNORMAL HIGH (ref 70–99)
Potassium: 4.3 mmol/L (ref 3.5–5.1)
Sodium: 139 mmol/L (ref 135–145)
Total Bilirubin: 0.3 mg/dL (ref 0.3–1.2)
Total Protein: 7.2 g/dL (ref 6.5–8.1)

## 2019-09-12 LAB — TSH: TSH: 2.843 u[IU]/mL (ref 0.320–4.118)

## 2019-09-12 MED ORDER — SODIUM CHLORIDE 0.9 % IV SOLN
480.0000 mg | Freq: Once | INTRAVENOUS | Status: AC
  Administered 2019-09-12: 15:00:00 480 mg via INTRAVENOUS
  Filled 2019-09-12: qty 48

## 2019-09-12 MED ORDER — SODIUM CHLORIDE 0.9 % IV SOLN
Freq: Once | INTRAVENOUS | Status: AC
  Administered 2019-09-12: 14:00:00 via INTRAVENOUS
  Filled 2019-09-12: qty 250

## 2019-09-12 NOTE — Patient Instructions (Signed)
[facility] Discharge Instructions for Patients Receiving Chemotherapy  Today you received the following chemotherapy agents Nivolumab (OPDIVO).  To help prevent nausea and vomiting after your treatment, we encourage you to take your nausea medication as prescribed.   If you develop nausea and vomiting that is not controlled by your nausea medication, call the clinic.   BELOW ARE SYMPTOMS THAT SHOULD BE REPORTED IMMEDIATELY:  *FEVER GREATER THAN 100.5 F  *CHILLS WITH OR WITHOUT FEVER  NAUSEA AND VOMITING THAT IS NOT CONTROLLED WITH YOUR NAUSEA MEDICATION  *UNUSUAL SHORTNESS OF BREATH  *UNUSUAL BRUISING OR BLEEDING  TENDERNESS IN MOUTH AND THROAT WITH OR WITHOUT PRESENCE OF ULCERS  *URINARY PROBLEMS  *BOWEL PROBLEMS  UNUSUAL RASH Items with * indicate a potential emergency and should be followed up as soon as possible.  Feel free to call the clinic should you have any questions or concerns. The clinic phone number is [phone].  Please show the [place] at check-in to the Emergency Department and triage nurse.  Coronavirus (COVID-19) Are you at risk?  Are you at risk for the Coronavirus (COVID-19)?  To be considered HIGH RISK for Coronavirus (COVID-19), you have to meet the following criteria:  . Traveled to [place], [place], [place], [place] or [place]; or in the [place] to [place], [place], [place], or [place]; and have fever, cough, and shortness of breath within the last 2 weeks of travel OR . Been in close contact with a person diagnosed with COVID-19 within the last 2 weeks and have fever, cough, and shortness of breath . IF YOU DO NOT MEET THESE CRITERIA, YOU ARE CONSIDERED LOW RISK FOR COVID-19.  What to do if you are HIGH RISK for COVID-19?  Marland Kitchen If you are having a medical emergency, call 911. . Seek medical care right away. Before you go to a doctor's office, urgent care or emergency department, call ahead and tell them  about your recent travel, contact with someone diagnosed with COVID-19, and your symptoms. You should receive instructions from your physician's office regarding next steps of care.  . When you arrive at healthcare provider, tell the healthcare staff immediately you have returned from visiting [place], [place], [place], [place] or [place]; or traveled in the [place] to [place], [place], [place], or [place]; in the last two weeks or you have been in close contact with a person diagnosed with COVID-19 in the last 2 weeks.   . Tell the health care staff about your symptoms: fever, cough and shortness of breath. . After you have been seen by a medical provider, you will be either: o Tested for (COVID-19) and discharged home on quarantine except to seek medical care if symptoms worsen, and asked to  - Stay home and avoid contact with others until you get your results (4-5 days)  - Avoid travel on public transportation if possible (such as bus, train, or airplane) or o Sent to the Emergency Department by EMS for evaluation, COVID-19 testing, and possible admission depending on your condition and test results.  What to do if you are LOW RISK for COVID-19?  Reduce your risk of any infection by using the same precautions used for avoiding the common cold or flu:  Marland Kitchen Wash your hands often with soap and warm water for at least 20 seconds.  If soap and water are not readily available, use an alcohol-based hand sanitizer with at least 60% alcohol.  . If coughing or  sneezing, cover your mouth and nose by coughing or sneezing into the elbow areas of your shirt or coat, into a tissue or into your sleeve (not your hands). . Avoid shaking hands with others and consider head nods or verbal greetings only. . Avoid touching your eyes, nose, or mouth with unwashed hands.  . Avoid close contact with people who are sick. . Avoid places or events with large numbers of people in one location, like concerts or  sporting events. . Carefully consider travel plans you have or are making. . If you are planning any travel outside or inside the [place], visit the CDC's Travelers' Health webpage for the latest health notices. . If you have some symptoms but not all symptoms, continue to monitor at home and seek medical attention if your symptoms worsen. . If you are having a medical emergency, call 911.   [place] / e-Visit: [email or web address]         [facility]: [facility]: [phone]                   [facility]: [phone]

## 2019-09-12 NOTE — Progress Notes (Signed)
[name] [name]   [phone]  OFFICE PROGRESS NOTE  System, Pcp Not In No address on file  DIAGNOSIS: Stage IV (T3, N0, M1 a) non-small cell lung cancer, squamous cell carcinoma presented with multiple bilateral pulmonary nodules diagnosed in April 2018.  PRIOR THERAPY:  Systemic chemotherapy with carboplatin for AUC of 5 and paclitaxel 175 MG/M2 every 3 weeks with Neulasta support. Status post 6 cycles.  CURRENT THERAPY: Second line treatment with immunotherapy with Nivolumab 480 mg IV every 4 weeks.  First cycle November 14, 2017.  Status post 21 cycles.  INTERVAL HISTORY: [name] [name] 68 y.o. male returns to the clinic today for follow-up visit.  The patient is feeling fine today with no concerning complaints except for mild shortness of breath with exertion.  He denied having any chest pain, cough or hemoptysis.  He has no nausea, vomiting, diarrhea or constipation.  He has no headache or visual changes.  He continues to have mild peripheral neuropathy.  He is here today for evaluation before starting cycle #22.   MEDICAL HISTORY: Past Medical History:  Diagnosis Date  . Depression 01/29/2017  . Dyspnea   . Encounter for antineoplastic chemotherapy 01/29/2017  . Goals of care, counseling/discussion 01/29/2017  . Hypertension   . Metastatic squamous cell carcinoma to lung, unspecified laterality ([place]) 01/28/2017  . Stroke [facility])     ALLERGIES:  has No Known Allergies.  MEDICATIONS:  Current Outpatient Medications  Medication Sig Dispense Refill  . acetaminophen (TYLENOL) 325 MG tablet You can take 2 every 4 hours as needed.  You can buy it over the counter DO NOT TAKE MORE THAN 4000 MG OF TYLENOL PER DAY.  IT CAN HARM YOUR LIVER.    Marland Kitchen amitriptyline (ELAVIL) 100 MG tablet Take 1 tablet (100 mg total) by mouth at bedtime. 30 tablet 3  . aspirin EC 81 MG tablet Take 81 mg by mouth daily.    Marland Kitchen gabapentin (NEURONTIN) 300 MG capsule TAKE  2 CAPSULES BY MOUTH THREE TIMES DAILY 90 capsule 0  . loperamide (IMODIUM) 2 MG capsule Take 1 capsule (2 mg total) by mouth 2 (two) times daily. Also available over the counter. You can take 1 extra dose in the afternoon if diarrhea is not improving. 60 capsule 4  . mesalamine (APRISO) 0.375 g 24 hr capsule Take 4 capsules by mouth daily.    . metoprolol succinate (TOPROL-XL) 25 MG 24 hr tablet Take 1 tablet (25 mg total) by mouth daily. 30 tablet 4  . mirtazapine (REMERON) 30 MG tablet TAKE 1 TABLET BY MOUTH AT BEDTIME 30 tablet 0  . nortriptyline (PAMELOR) 10 MG capsule Take 10 mg by mouth 2 (two) times daily.     . pantoprazole (PROTONIX) 40 MG tablet Take 1 tablet (40 mg total) by mouth 2 (two) times daily. 60 tablet 1  . simvastatin (ZOCOR) 20 MG tablet Take 20 mg by mouth daily.    . temazepam (RESTORIL) 30 MG capsule TAKE 1 CAPSULE BY MOUTH NIGHTLY AS NEEDED FOR SLEEP 30 capsule 0   No current facility-administered medications for this visit.     SURGICAL HISTORY:  Past Surgical History:  Procedure Laterality Date  . BACK SURGERY     cyst removal  . COLONOSCOPY    . LAPAROTOMY N/A 02/25/2017   Procedure: EXPLORATORY LAPAROTOMY MODIFIED GRAHAM'S PATCH;  Surgeon: Kieth Brightly Arta Bruce, MD;  Location: [facility];  Service: General;  Laterality: N/A;  . MINOR  PLACEMENT OF FIDUCIAL N/A 01/19/2017   Procedure: MINOR PLACEMENT OF FIDUCIAL x 6;  Surgeon: Collene Gobble, MD;  Location: [facility];  Service: Thoracic;  Laterality: N/A;  . VIDEO BRONCHOSCOPY WITH ENDOBRONCHIAL NAVIGATION N/A 01/19/2017   Procedure: VIDEO BRONCHOSCOPY WITH ENDOBRONCHIAL NAVIGATION;  Surgeon: Collene Gobble, MD;  Location: [facility];  Service: Thoracic;  Laterality: N/A;    REVIEW OF SYSTEMS:  A comprehensive review of systems was negative except for: Respiratory: positive for dyspnea on exertion Neurological: positive for paresthesia   PHYSICAL EXAMINATION: General appearance: alert, cooperative and no distress Head:  Normocephalic, without obvious abnormality, atraumatic Neck: no adenopathy, no JVD, supple, symmetrical, trachea midline and thyroid not enlarged, symmetric, no tenderness/mass/nodules Lymph nodes: Cervical, supraclavicular, and axillary nodes normal. Resp: clear to auscultation bilaterally Back: symmetric, no curvature. ROM normal. No CVA tenderness. Cardio: regular rate and rhythm, S1, S2 normal, no murmur, click, rub or gallop GI: soft, non-tender; bowel sounds normal; no masses,  no organomegaly Extremities: extremities normal, atraumatic, no cyanosis or edema  ECOG PERFORMANCE STATUS: 1 - Symptomatic but completely ambulatory   Blood pressure (!) 154/93, pulse 76, temperature 98.5 F (36.9 C), temperature source Oral, resp. rate 20, height 5\' 8"  (1.727 m), weight 195 lb 8 oz (88.7 kg), SpO2 97 %.  LABORATORY DATA: Lab Results  Component Value Date   WBC 9.7 09/12/2019   HGB 14.2 09/12/2019   HCT 44.5 09/12/2019   MCV 91.2 09/12/2019   PLT 247 09/12/2019      Chemistry      Component Value Date/Time   NA 138 08/16/2019 1336   NA 139 10/06/2017 1052   K 4.2 08/16/2019 1336   K 4.2 10/06/2017 1052   CL 101 08/16/2019 1336   CO2 27 08/16/2019 1336   CO2 25 10/06/2017 1052   BUN 10 08/16/2019 1336   BUN 13.2 10/06/2017 1052   CREATININE 0.92 08/16/2019 1336   CREATININE 0.9 10/06/2017 1052      Component Value Date/Time   CALCIUM 9.2 08/16/2019 1336   CALCIUM 9.5 10/06/2017 1052   ALKPHOS 82 08/16/2019 1336   ALKPHOS 68 10/06/2017 1052   AST 13 (L) 08/16/2019 1336   AST 14 10/06/2017 1052   ALT 10 08/16/2019 1336   ALT 13 10/06/2017 1052   BILITOT 0.3 08/16/2019 1336   BILITOT 0.44 10/06/2017 1052       RADIOGRAPHIC STUDIES: No results found.  ASSESSMENT AND PLAN:  This is a very pleasant 68 years old white male with stage IV non-small cell lung cancer, squamous cell carcinoma presented with multiple bilateral pulmonary nodules. The patient was treated with  systemic chemotherapy with carboplatin and paclitaxel status post 6 cycles. He tolerated the last cycle of his treatment well except for the persistent peripheral neuropathy. The patient has been in observation for the last 3 months. He had a repeat CT scan of the chest, abdomen and pelvis performed recently.  Has a scan showed mild interval enlargement of left upper lobe as well as right upper lobe pulmonary nodules concerning for disease progression. The patient was started on treatment with immunotherapy with Nivolumab 480 mg IV every 4 weeks status post 21 cycles.   The patient continues to tolerate this treatment well with no concerning adverse effects. I recommended for him to proceed with cycle #22 today as planned. We will see him back for follow-up visit in 4 weeks for evaluation after repeating CT scan of the chest, abdomen and pelvis for restaging of  his disease. For the peripheral neuropathy he will continue his current treatment with Neurontin 600 mg p.o. 3 times daily. The patient was advised to call immediately if he has any concerning symptoms in the interval. The patient voices understanding of current disease status and treatment options and is in agreement with the current care plan. All questions were answered. The patient knows to call the clinic with any problems, questions or concerns. We can certainly see the patient much sooner if necessary.  Disclaimer: This note was dictated with voice recognition software. Similar sounding words can inadvertently be transcribed and may not be corrected upon review.

## 2019-09-14 ENCOUNTER — Telehealth: Payer: Self-pay | Admitting: Internal Medicine

## 2019-09-14 NOTE — Telephone Encounter (Signed)
Scheduled per los. Called and spoke with patient. Confirmed upcoming appt

## 2019-09-17 ENCOUNTER — Telehealth: Payer: Self-pay | Admitting: *Deleted

## 2019-09-17 DIAGNOSIS — C7800 Secondary malignant neoplasm of unspecified lung: Secondary | ICD-10-CM

## 2019-09-17 DIAGNOSIS — C7931 Secondary malignant neoplasm of brain: Secondary | ICD-10-CM

## 2019-09-17 DIAGNOSIS — Z5111 Encounter for antineoplastic chemotherapy: Secondary | ICD-10-CM

## 2019-09-17 MED ORDER — GABAPENTIN 300 MG PO CAPS: 600.0000 mg | ORAL_CAPSULE | Freq: Three times a day (TID) | ORAL | 3 refills | Status: DC

## 2019-09-17 NOTE — Addendum Note (Signed)
Addended by: Ventura Sellers on: 09/17/2019 12:15 PM   Modules accepted: Orders

## 2019-09-17 NOTE — Telephone Encounter (Signed)
[name] [name] left a message requesting a refill of gabapentin

## 2019-09-26 ENCOUNTER — Telehealth: Payer: Self-pay

## 2019-09-26 ENCOUNTER — Other Ambulatory Visit: Payer: Self-pay | Admitting: Internal Medicine

## 2019-09-26 DIAGNOSIS — C7800 Secondary malignant neoplasm of unspecified lung: Secondary | ICD-10-CM

## 2019-09-26 DIAGNOSIS — G4700 Insomnia, unspecified: Secondary | ICD-10-CM

## 2019-09-26 MED ORDER — TEMAZEPAM 30 MG PO CAPS: 30.0000 mg | ORAL_CAPSULE | Freq: Every evening | ORAL | 0 refills | Status: DC | PRN

## 2019-09-26 MED ORDER — MIRTAZAPINE 30 MG PO TABS: 30.0000 mg | ORAL_TABLET | Freq: Every day | ORAL | 0 refills | Status: DC

## 2019-09-26 MED ORDER — AMITRIPTYLINE HCL 100 MG PO TABS: 100.0000 mg | ORAL_TABLET | Freq: Every day | ORAL | 3 refills | Status: DC

## 2019-09-26 NOTE — Telephone Encounter (Signed)
Pt called requesting refills for Restoril, Elavil, and Remeron

## 2019-10-08 ENCOUNTER — Ambulatory Visit (HOSPITAL_COMMUNITY)
Admission: RE | Admit: 2019-10-08 | Discharge: 2019-10-08 | Disposition: A | Discharge status: Home / Self Care | Payer: Medicare Other | Source: Ambulatory Visit | Attending: Internal Medicine | Admitting: Internal Medicine

## 2019-10-08 ENCOUNTER — Other Ambulatory Visit: Payer: Self-pay

## 2019-10-08 ENCOUNTER — Encounter (HOSPITAL_COMMUNITY): Payer: Self-pay

## 2019-10-08 DIAGNOSIS — C3490 Malignant neoplasm of unspecified part of unspecified bronchus or lung: Secondary | ICD-10-CM | POA: Insufficient documentation

## 2019-10-08 MED ORDER — SODIUM CHLORIDE (PF) 0.9 % IJ SOLN
INTRAMUSCULAR | Status: AC
  Filled 2019-10-08: qty 50

## 2019-10-08 MED ORDER — IOHEXOL 300 MG/ML  SOLN
100.0000 mL | Freq: Once | INTRAMUSCULAR | Status: AC | PRN
  Administered 2019-10-08: 12:00:00 100 mL via INTRAVENOUS

## 2019-10-11 ENCOUNTER — Encounter: Payer: Self-pay | Admitting: Physician Assistant

## 2019-10-11 ENCOUNTER — Other Ambulatory Visit: Payer: Self-pay

## 2019-10-11 ENCOUNTER — Inpatient Hospital Stay: Payer: Medicare Other | Attending: Internal Medicine

## 2019-10-11 ENCOUNTER — Inpatient Hospital Stay (HOSPITAL_BASED_OUTPATIENT_CLINIC_OR_DEPARTMENT_OTHER): Payer: Medicare Other | Admitting: Physician Assistant

## 2019-10-11 ENCOUNTER — Inpatient Hospital Stay: Payer: Medicare Other

## 2019-10-11 VITALS — BP 160/96 | HR 77 | Temp 98.3°F | Resp 18 | Ht 68.0 in | Wt 198.4 lb

## 2019-10-11 DIAGNOSIS — C3490 Malignant neoplasm of unspecified part of unspecified bronchus or lung: Secondary | ICD-10-CM

## 2019-10-11 DIAGNOSIS — I10 Essential (primary) hypertension: Secondary | ICD-10-CM

## 2019-10-11 DIAGNOSIS — C3412 Malignant neoplasm of upper lobe, left bronchus or lung: Secondary | ICD-10-CM | POA: Diagnosis present

## 2019-10-11 DIAGNOSIS — Z5112 Encounter for antineoplastic immunotherapy: Secondary | ICD-10-CM | POA: Diagnosis present

## 2019-10-11 DIAGNOSIS — R5382 Chronic fatigue, unspecified: Secondary | ICD-10-CM

## 2019-10-11 DIAGNOSIS — C7800 Secondary malignant neoplasm of unspecified lung: Secondary | ICD-10-CM | POA: Diagnosis not present

## 2019-10-11 DIAGNOSIS — C3411 Malignant neoplasm of upper lobe, right bronchus or lung: Secondary | ICD-10-CM | POA: Insufficient documentation

## 2019-10-11 DIAGNOSIS — G629 Polyneuropathy, unspecified: Secondary | ICD-10-CM | POA: Diagnosis not present

## 2019-10-11 DIAGNOSIS — Z79899 Other long term (current) drug therapy: Secondary | ICD-10-CM | POA: Insufficient documentation

## 2019-10-11 LAB — CBC WITH DIFFERENTIAL (CANCER CENTER ONLY)
Abs Immature Granulocytes: 0.02 10*3/uL (ref 0.00–0.07)
Basophils Absolute: 0.1 10*3/uL (ref 0.0–0.1)
Basophils Relative: 1 %
Eosinophils Absolute: 0.3 10*3/uL (ref 0.0–0.5)
Eosinophils Relative: 3 %
HCT: 44.4 % (ref 39.0–52.0)
Hemoglobin: 14.4 g/dL (ref 13.0–17.0)
Immature Granulocytes: 0 %
Lymphocytes Relative: 40 %
Lymphs Abs: 3.9 10*3/uL (ref 0.7–4.0)
MCH: 29.1 pg (ref 26.0–34.0)
MCHC: 32.4 g/dL (ref 30.0–36.0)
MCV: 89.9 fL (ref 80.0–100.0)
Monocytes Absolute: 0.7 10*3/uL (ref 0.1–1.0)
Monocytes Relative: 7 %
Neutro Abs: 4.7 10*3/uL (ref 1.7–7.7)
Neutrophils Relative %: 49 %
Platelet Count: 230 10*3/uL (ref 150–400)
RBC: 4.94 MIL/uL (ref 4.22–5.81)
RDW: 13.6 % (ref 11.5–15.5)
WBC Count: 9.6 10*3/uL (ref 4.0–10.5)
nRBC: 0 % (ref 0.0–0.2)

## 2019-10-11 LAB — CMP (CANCER CENTER ONLY)
ALT: 12 U/L (ref 0–44)
AST: 15 U/L (ref 15–41)
Albumin: 4.1 g/dL (ref 3.5–5.0)
Alkaline Phosphatase: 67 U/L (ref 38–126)
Anion gap: 10 (ref 5–15)
BUN: 11 mg/dL (ref 8–23)
CO2: 27 mmol/L (ref 22–32)
Calcium: 9 mg/dL (ref 8.9–10.3)
Chloride: 102 mmol/L (ref 98–111)
Creatinine: 0.9 mg/dL (ref 0.61–1.24)
GFR, Est AFR Am: >60 mL/min (ref >60)
GFR, Estimated: >60 mL/min (ref >60)
Glucose, Bld: 91 mg/dL (ref 70–99)
Potassium: 4.2 mmol/L (ref 3.5–5.1)
Sodium: 139 mmol/L (ref 135–145)
Total Bilirubin: 0.3 mg/dL (ref 0.3–1.2)
Total Protein: 7.5 g/dL (ref 6.5–8.1)

## 2019-10-11 LAB — TSH: TSH: 4.182 u[IU]/mL — ABNORMAL HIGH (ref 0.320–4.118)

## 2019-10-11 MED ORDER — SODIUM CHLORIDE 0.9 % IV SOLN
Freq: Once | INTRAVENOUS | Status: AC
  Filled 2019-10-11: qty 250

## 2019-10-11 MED ORDER — SODIUM CHLORIDE 0.9 % IV SOLN
480.0000 mg | Freq: Once | INTRAVENOUS | Status: AC
  Administered 2019-10-11: 15:00:00 480 mg via INTRAVENOUS
  Filled 2019-10-11: qty 48

## 2019-10-11 NOTE — Progress Notes (Signed)
[facility] OFFICE PROGRESS NOTE  System, Pcp Not In No address on file  DIAGNOSIS: Stage IV (T3, N0, M1 a) non-small cell lung cancer, squamous cell carcinoma presented with multiple bilateral pulmonary nodules diagnosed in April 2018.  PRIOR THERAPY: Systemic chemotherapy with carboplatin for AUC of 5 and paclitaxel 175 MG/M2 every 3 weeks with Neulasta support. Status post 6 cycles.  CURRENT THERAPY: Second line treatment with immunotherapy was Nivolumab 480 mg IV every 4 weeks. First cycle November 14, 2017. Status post22cycles  INTERVAL HISTORY: [name] [name] 68 y.o. male returns to the clinic for a follow up visit. The patient is feeling well today without any concerning complaints. The patient continues to tolerate treatment with immunotherapy with nivolumab well without any adverse effects. His diarrhea is "100% better" with the use of imodium. Denies any fever, chills, night sweats, or weight loss. Denies any chest pain or hemoptysis. He reports his baseline shortness of breath with exertion. He denies any significant or unusual cough. Denies any nausea, vomiting, or constipation. Denies any headache or visual changes. Denies any rashes or skin changes.The patient recently had a restaging CT scan performed. The patient is here today for evaluation and to review his scan results prior to starting cycle #  23  MEDICAL HISTORY: Past Medical History:  Diagnosis Date  . Depression 01/29/2017  . Dyspnea   . Encounter for antineoplastic chemotherapy 01/29/2017  . Goals of care, counseling/discussion 01/29/2017  . Hypertension   . Metastatic squamous cell carcinoma to lung, unspecified laterality ([place]) 01/28/2017  . Stroke [facility])     ALLERGIES:  has No Known Allergies.  MEDICATIONS:  Current Outpatient Medications  Medication Sig Dispense Refill  . acetaminophen (TYLENOL) 325 MG tablet You can take 2 every 4 hours as needed.  You can buy it over the counter DO NOT TAKE  MORE THAN 4000 MG OF TYLENOL PER DAY.  IT CAN HARM YOUR LIVER.    Marland Kitchen amitriptyline (ELAVIL) 100 MG tablet Take 1 tablet (100 mg total) by mouth at bedtime. 30 tablet 3  . aspirin EC 81 MG tablet Take 81 mg by mouth daily.    Marland Kitchen gabapentin (NEURONTIN) 300 MG capsule Take 2 capsules (600 mg total) by mouth 3 (three) times daily. 90 capsule 3  . loperamide (IMODIUM) 2 MG capsule Take 1 capsule (2 mg total) by mouth 2 (two) times daily. Also available over the counter. You can take 1 extra dose in the afternoon if diarrhea is not improving. 60 capsule 4  . mesalamine (APRISO) 0.375 g 24 hr capsule Take 4 capsules by mouth daily.    . metoprolol succinate (TOPROL-XL) 25 MG 24 hr tablet Take 1 tablet (25 mg total) by mouth daily. 30 tablet 4  . mirtazapine (REMERON) 30 MG tablet Take 1 tablet (30 mg total) by mouth at bedtime. 30 tablet 0  . nortriptyline (PAMELOR) 10 MG capsule Take 10 mg by mouth 2 (two) times daily.     . pantoprazole (PROTONIX) 40 MG tablet Take 1 tablet (40 mg total) by mouth 2 (two) times daily. 60 tablet 1  . simvastatin (ZOCOR) 20 MG tablet Take 20 mg by mouth daily.    . temazepam (RESTORIL) 30 MG capsule Take 1 capsule (30 mg total) by mouth at bedtime as needed for sleep. 30 capsule 0   No current facility-administered medications for this visit.    SURGICAL HISTORY:  Past Surgical History:  Procedure Laterality Date  . BACK SURGERY  cyst removal  . COLONOSCOPY    . LAPAROTOMY N/A 02/25/2017   Procedure: EXPLORATORY LAPAROTOMY MODIFIED GRAHAM'S PATCH;  Surgeon: Kinsinger, Arta Bruce, MD;  Location: [facility];  Service: General;  Laterality: N/A;  . MINOR PLACEMENT OF FIDUCIAL N/A 01/19/2017   Procedure: MINOR PLACEMENT OF FIDUCIAL x 6;  Surgeon: Collene Gobble, MD;  Location: [facility];  Service: Thoracic;  Laterality: N/A;  . VIDEO BRONCHOSCOPY WITH ENDOBRONCHIAL NAVIGATION N/A 01/19/2017   Procedure: VIDEO BRONCHOSCOPY WITH ENDOBRONCHIAL NAVIGATION;  Surgeon: Collene Gobble,  MD;  Location: [facility];  Service: Thoracic;  Laterality: N/A;    REVIEW OF SYSTEMS:   Review of Systems  Constitutional: Negative for appetite change, chills, fatigue, fever and unexpected weight change.  HENT: Negative for mouth sores, nosebleeds, sore throat and trouble swallowing.   Eyes: Negative for eye problems and icterus.  Respiratory: Positive for baseline dyspnea on exertion. Negative for cough, hemoptysis, and wheezing.  Cardiovascular: Negative for chest pain and leg swelling.  Gastrointestinal: Negative for abdominal pain, constipation, diarrhea, nausea and vomiting.  Genitourinary: Negative for bladder incontinence, difficulty urinating, dysuria, frequency and hematuria.   Musculoskeletal: Negative for back pain, gait problem, neck pain and neck stiffness.  Skin: Negative for itching and rash.  Neurological: Negative for dizziness, extremity weakness, gait problem, headaches, light-headedness and seizures.  Hematological: Negative for adenopathy. Does not bruise/bleed easily.  Psychiatric/Behavioral: Negative for confusion, depression and sleep disturbance. The patient is not nervous/anxious.     PHYSICAL EXAMINATION:  Blood pressure (!) 160/96, pulse 77, temperature 98.3 F (36.8 C), temperature source Temporal, resp. rate 18, height 5\' 8"  (1.727 m), weight 198 lb 6.4 oz (90 kg), SpO2 95 %.  ECOG PERFORMANCE STATUS: 1 - Symptomatic but completely ambulatory  Physical Exam  Constitutional: Oriented to person, place, and time and well-developed, well-nourished, and in no distress.   HENT:  Head: Normocephalic and atraumatic.  Mouth/Throat: Oropharynx is clear and moist. No oropharyngeal exudate.  Eyes: Conjunctivae are normal. Right eye exhibits no discharge. Left eye exhibits no discharge. No scleral icterus.  Neck: Normal range of motion. Neck supple.  Cardiovascular: Normal rate, regular rhythm, normal heart sounds and intact distal pulses.   Pulmonary/Chest: Effort  normal. Decreased breath sounds in all lung fields. No respiratory distress. No wheezes. No rales.  Abdominal: Soft. Bowel sounds are normal. Exhibits no distension and no mass. There is no tenderness.  Musculoskeletal: Normal range of motion. Exhibits no edema.  Lymphadenopathy:    No cervical adenopathy.  Neurological: Alert and oriented to person, place, and time. Exhibits normal muscle tone. Gait normal. Coordination normal.  Skin: Skin is warm and dry. No rash noted. Not diaphoretic. No erythema. No pallor.  Psychiatric: Mood, memory and judgment normal.  Vitals reviewed.  LABORATORY DATA: Lab Results  Component Value Date   WBC 9.6 10/11/2019   HGB 14.4 10/11/2019   HCT 44.4 10/11/2019   MCV 89.9 10/11/2019   PLT 230 10/11/2019      Chemistry      Component Value Date/Time   NA 139 10/11/2019 1307   NA 139 10/06/2017 1052   K 4.2 10/11/2019 1307   K 4.2 10/06/2017 1052   CL 102 10/11/2019 1307   CO2 27 10/11/2019 1307   CO2 25 10/06/2017 1052   BUN 11 10/11/2019 1307   BUN 13.2 10/06/2017 1052   CREATININE 0.90 10/11/2019 1307   CREATININE 0.9 10/06/2017 1052      Component Value Date/Time   CALCIUM 9.0 10/11/2019  1307   CALCIUM 9.5 10/06/2017 1052   ALKPHOS 67 10/11/2019 1307   ALKPHOS 68 10/06/2017 1052   AST 15 10/11/2019 1307   AST 14 10/06/2017 1052   ALT 12 10/11/2019 1307   ALT 13 10/06/2017 1052   BILITOT 0.3 10/11/2019 1307   BILITOT 0.44 10/06/2017 1052       RADIOGRAPHIC STUDIES:  CT Chest W Contrast  Result Date: 10/08/2019 CLINICAL DATA:  Non-small cell lung cancer, chemotherapy/XRT complete, immunotherapy ongoing, occasional shortness of breath EXAM: CT CHEST, ABDOMEN, AND PELVIS WITH CONTRAST TECHNIQUE: Multidetector CT imaging of the chest, abdomen and pelvis was performed following the standard protocol during bolus administration of intravenous contrast. CONTRAST:  124mL OMNIPAQUE IOHEXOL 300 MG/ML  SOLN COMPARISON:  06/18/2019 FINDINGS:  CT CHEST FINDINGS Cardiovascular: Heart is normal in size. No pericardial effusion. Ectasia of the ascending thoracic aorta, measuring 4.0 cm. Atherosclerotic calcifications of the aortic arch. Mild coronary atherosclerosis the LAD and right coronary artery. Mediastinum/Nodes: No suspicious mediastinal, hilar, or axillary lymphadenopathy. 6 mm short axis low right paratracheal node, unchanged. Visualized thyroid is unremarkable. Lungs/Pleura: Biapical pleural-parenchymal scarring. Mild centrilobular and paraseptal emphysematous changes, upper lung predominant. Bilateral pulmonary nodules, including: --9 mm posterior right upper lobe nodule (series 6/image 41), unchanged --6 mm nodule in the posterior right upper lobe (series 6/image 57), previously 7 mm --8 x 14 mm irregular nodule in the lateral left upper lobe (series 6/image 61), previously 5 x 14 mm, similar --6 mm nodule in the lateral right upper lobe (series 6/image 61), new No focal consolidation. Mild subpleural reticulation in the left upper lobe, favoring radiation changes. Mild peribronchial thickening in the bilateral upper lobes. No pleural effusion or pneumothorax. Musculoskeletal: Mild degenerative changes of the mid thoracic spine. CT ABDOMEN PELVIS FINDINGS Hepatobiliary: Mildly nodular left hepatic lobe contour. No focal hepatic lesion is seen. Suspected layering gallstones and/or gallbladder sludge (series 2/image 73). No intrahepatic or extrahepatic ductal dilatation. Pancreas: Within normal limits. Spleen: Within normal limits. Adrenals/Urinary Tract: Stable left adrenal nodularity, measuring at least 1.4 cm, favored to reflect benign adrenal adenomas. Mild thickening of the right adrenal gland. Kidneys are within normal limits. No hydronephrosis. Bladder is thick-walled. Stomach/Bowel: Stomach is within normal limits. No evidence of bowel obstruction. Normal appendix (series 2/image 102). Left colonic diverticulosis, without evidence of  diverticulitis. Vascular/Lymphatic: Fusiform ectasia of the infrarenal abdominal aorta, measuring 3.3 x 3.0 cm (series 2/image 81), unchanged. Atherosclerotic calcifications of the abdominal aorta and branch vessels. No suspicious abdominopelvic lymphadenopathy. Reproductive: Prostate is unremarkable. Other: No abdominopelvic ascites. Small right and tiny left fat containing inguinal hernias. Musculoskeletal: Visualized osseous structures are within normal limits. IMPRESSION: Bilateral pulmonary nodules, most of which are grossly unchanged. A single 6 mm subpleural nodule in the right upper lobe may be new/progressive. Continued attention on follow-up is suggested. No findings specific for metastatic disease in the abdomen/pelvis. Ectasia of the ascending thoracic aorta, measuring 4.0 cm. 3.3 cm infrarenal abdominal aortic aneurysm. In a patient on known cancer surveillance, these are amenable to attention follow-up. Additional stable ancillary findings as above. Electronically Signed   By: Julian Hy M.D.   On: 10/08/2019 14:25   CT Abdomen Pelvis W Contrast  Result Date: 10/08/2019 CLINICAL DATA:  Non-small cell lung cancer, chemotherapy/XRT complete, immunotherapy ongoing, occasional shortness of breath EXAM: CT CHEST, ABDOMEN, AND PELVIS WITH CONTRAST TECHNIQUE: Multidetector CT imaging of the chest, abdomen and pelvis was performed following the standard protocol during bolus administration of intravenous contrast. CONTRAST:  141mL  OMNIPAQUE IOHEXOL 300 MG/ML  SOLN COMPARISON:  06/18/2019 FINDINGS: CT CHEST FINDINGS Cardiovascular: Heart is normal in size. No pericardial effusion. Ectasia of the ascending thoracic aorta, measuring 4.0 cm. Atherosclerotic calcifications of the aortic arch. Mild coronary atherosclerosis the LAD and right coronary artery. Mediastinum/Nodes: No suspicious mediastinal, hilar, or axillary lymphadenopathy. 6 mm short axis low right paratracheal node, unchanged. Visualized  thyroid is unremarkable. Lungs/Pleura: Biapical pleural-parenchymal scarring. Mild centrilobular and paraseptal emphysematous changes, upper lung predominant. Bilateral pulmonary nodules, including: --9 mm posterior right upper lobe nodule (series 6/image 41), unchanged --6 mm nodule in the posterior right upper lobe (series 6/image 57), previously 7 mm --8 x 14 mm irregular nodule in the lateral left upper lobe (series 6/image 61), previously 5 x 14 mm, similar --6 mm nodule in the lateral right upper lobe (series 6/image 61), new No focal consolidation. Mild subpleural reticulation in the left upper lobe, favoring radiation changes. Mild peribronchial thickening in the bilateral upper lobes. No pleural effusion or pneumothorax. Musculoskeletal: Mild degenerative changes of the mid thoracic spine. CT ABDOMEN PELVIS FINDINGS Hepatobiliary: Mildly nodular left hepatic lobe contour. No focal hepatic lesion is seen. Suspected layering gallstones and/or gallbladder sludge (series 2/image 73). No intrahepatic or extrahepatic ductal dilatation. Pancreas: Within normal limits. Spleen: Within normal limits. Adrenals/Urinary Tract: Stable left adrenal nodularity, measuring at least 1.4 cm, favored to reflect benign adrenal adenomas. Mild thickening of the right adrenal gland. Kidneys are within normal limits. No hydronephrosis. Bladder is thick-walled. Stomach/Bowel: Stomach is within normal limits. No evidence of bowel obstruction. Normal appendix (series 2/image 102). Left colonic diverticulosis, without evidence of diverticulitis. Vascular/Lymphatic: Fusiform ectasia of the infrarenal abdominal aorta, measuring 3.3 x 3.0 cm (series 2/image 81), unchanged. Atherosclerotic calcifications of the abdominal aorta and branch vessels. No suspicious abdominopelvic lymphadenopathy. Reproductive: Prostate is unremarkable. Other: No abdominopelvic ascites. Small right and tiny left fat containing inguinal hernias. Musculoskeletal:  Visualized osseous structures are within normal limits. IMPRESSION: Bilateral pulmonary nodules, most of which are grossly unchanged. A single 6 mm subpleural nodule in the right upper lobe may be new/progressive. Continued attention on follow-up is suggested. No findings specific for metastatic disease in the abdomen/pelvis. Ectasia of the ascending thoracic aorta, measuring 4.0 cm. 3.3 cm infrarenal abdominal aortic aneurysm. In a patient on known cancer surveillance, these are amenable to attention follow-up. Additional stable ancillary findings as above. Electronically Signed   By: Julian Hy M.D.   On: 10/08/2019 14:25     ASSESSMENT/PLAN:  This is a very pleasant 68 year old Caucasian male with stage IV non-small cell lung cancer, squamous cell carcinoma. He presented with multiple bilateral pulmonary nodules. He was diagnosed in April 2018. He underwent systemic chemotherapy with carboplatin and paclitaxel. He is status post 6 cycles. He tolerated it well except for the development of persistent peripheral neuropathy. He was then on observation for 3 months. He showed signs of disease progression.   He is currently being treated with immunotherapy with nivolumab 480 mg IV every 4 weeks. He is status post 22cycles.  The patient was seen with Dr. Julien Nordmann today.  Dr. Julien Nordmann personally and independently reviewed the patient's scan and discussed results with the patient today.  The scan did not show any evidence of disease progression.  Dr. Julien Nordmann recommends the patient continue on the same treatment at the same dose.  He will receive cycle #23 today scheduled.  We will see the patient back for follow-up visit in 4 weeks for evaluation before starting cycle #24.  The patient's blood pressure was noted to be elevated while in the clinic today.  The patient states that he recently transition to a new primary care provider but has not been seen yet.  Patient was advised to monitor his  blood pressure at home and keep a log of his blood pressure readings.  If the patient's blood pressure continues to be elevated, he was instructed to follow-up with his primary care provider about further management of his antihypertensives. He states that he took his medications as prescribed today.   The patient will continue taking gabapentin and amitriptyline for his peripheral neuropathy.  The patient will continue to use imodium for his diarrhea.   The patient was advised to call immediately if he has any concerning symptoms in the interval. The patient voices understanding of current disease status and treatment options and is in agreement with the current care plan. All questions were answered. The patient knows to call the clinic with any problems, questions or concerns. We can certainly see the patient much sooner if necessary  No orders of the defined types were placed in this encounter.    Aerik Polan L Jahna Liebert, PA-C 10/11/19  ADDENDUM: Hematology/Oncology Attending: I had a face-to-face encounter with the patient today.  I recommended his care plan.  This is a very pleasant 68 years old white male with metastatic non-small cell lung cancer, squamous cell carcinoma status post systemic chemotherapy with carboplatin and paclitaxel for 6 cycles discontinued secondary to disease progression.  The patient is currently undergoing second line treatment with immunotherapy with nivolumab 480 mg IV every 4 weeks status post 22 cycles. He has been tolerating this treatment well with no concerning adverse effects. He had repeat CT scan of the chest, abdomen pelvis performed recently.  I personally and independently reviewed the scans and discussed the results with the patient today. His scan showed no concerning findings for disease progression. I recommended for the patient to continue his current treatment with nivolumab and he will proceed with cycle #23 today as planned. He will come  back for follow-up visit in 4 weeks for evaluation before the next cycle of his treatment. For the peripheral neuropathy he will continue his current treatment with gabapentin and amitriptyline. For the occasional diarrhea the patient will use Imodium on as-needed basis. He was advised to call immediately if he has any concerning symptoms in the interval.  Disclaimer: This note was dictated with voice recognition software. Similar sounding words can inadvertently be transcribed and may be missed upon review. Eilleen Kempf, MD 10/11/19

## 2019-10-11 NOTE — Patient Instructions (Signed)
[facility] Discharge Instructions for Patients Receiving Chemotherapy  Today you received the following chemotherapy agents Nivolumab (OPDIVO).  To help prevent nausea and vomiting after your treatment, we encourage you to take your nausea medication as prescribed.   If you develop nausea and vomiting that is not controlled by your nausea medication, call the clinic.   BELOW ARE SYMPTOMS THAT SHOULD BE REPORTED IMMEDIATELY:  *FEVER GREATER THAN 100.5 F  *CHILLS WITH OR WITHOUT FEVER  NAUSEA AND VOMITING THAT IS NOT CONTROLLED WITH YOUR NAUSEA MEDICATION  *UNUSUAL SHORTNESS OF BREATH  *UNUSUAL BRUISING OR BLEEDING  TENDERNESS IN MOUTH AND THROAT WITH OR WITHOUT PRESENCE OF ULCERS  *URINARY PROBLEMS  *BOWEL PROBLEMS  UNUSUAL RASH Items with * indicate a potential emergency and should be followed up as soon as possible.  Feel free to call the clinic should you have any questions or concerns. The clinic phone number is [phone].  Please show the [facility] at check-in to the Emergency Department and triage nurse.  Coronavirus (COVID-19) Are you at risk?  Are you at risk for the Coronavirus (COVID-19)?  To be considered HIGH RISK for Coronavirus (COVID-19), you have to meet the following criteria:  . Traveled to [place], [place], [place], [place] or [place]; or in the [place] to [place], [place], [place], or [place]; and have fever, cough, and shortness of breath within the last 2 weeks of travel OR . Been in close contact with a person diagnosed with COVID-19 within the last 2 weeks and have fever, cough, and shortness of breath . IF YOU DO NOT MEET THESE CRITERIA, YOU ARE CONSIDERED LOW RISK FOR COVID-19.  What to do if you are HIGH RISK for COVID-19?  Marland Kitchen If you are having a medical emergency, call 911. . Seek medical care right away. Before you go to a doctor's office, urgent care or emergency department, call ahead and tell them  about your recent travel, contact with someone diagnosed with COVID-19, and your symptoms. You should receive instructions from your physician's office regarding next steps of care.  . When you arrive at healthcare provider, tell the healthcare staff immediately you have returned from visiting [place], [place], [place], [place] or [place]; or traveled in the [place] to [place], [place], [place], or [place]; in the last two weeks or you have been in close contact with a person diagnosed with COVID-19 in the last 2 weeks.   . Tell the health care staff about your symptoms: fever, cough and shortness of breath. . After you have been seen by a medical provider, you will be either: o Tested for (COVID-19) and discharged home on quarantine except to seek medical care if symptoms worsen, and asked to  - Stay home and avoid contact with others until you get your results (4-5 days)  - Avoid travel on public transportation if possible (such as bus, train, or airplane) or o Sent to the Emergency Department by EMS for evaluation, COVID-19 testing, and possible admission depending on your condition and test results.  What to do if you are LOW RISK for COVID-19?  Reduce your risk of any infection by using the same precautions used for avoiding the common cold or flu:  Marland Kitchen Wash your hands often with soap and warm water for at least 20 seconds.  If soap and water are not readily available, use an alcohol-based hand sanitizer with at least 60% alcohol.  . If coughing or  sneezing, cover your mouth and nose by coughing or sneezing into the elbow areas of your shirt or coat, into a tissue or into your sleeve (not your hands). . Avoid shaking hands with others and consider head nods or verbal greetings only. . Avoid touching your eyes, nose, or mouth with unwashed hands.  . Avoid close contact with people who are sick. . Avoid places or events with large numbers of people in one location, like concerts or  sporting events. . Carefully consider travel plans you have or are making. . If you are planning any travel outside or inside the [place], visit the CDC's Travelers' Health webpage for the latest health notices. . If you have some symptoms but not all symptoms, continue to monitor at home and seek medical attention if your symptoms worsen. . If you are having a medical emergency, call 911.   [place] / e-Visit: [email or web address]         [facility]: [facility]: [phone]                   [facility]: [phone]

## 2019-10-29 ENCOUNTER — Other Ambulatory Visit: Payer: Self-pay | Admitting: Internal Medicine

## 2019-10-29 ENCOUNTER — Telehealth: Payer: Self-pay | Admitting: *Deleted

## 2019-10-29 ENCOUNTER — Other Ambulatory Visit: Payer: Self-pay | Admitting: *Deleted

## 2019-10-29 DIAGNOSIS — C7800 Secondary malignant neoplasm of unspecified lung: Secondary | ICD-10-CM

## 2019-10-29 DIAGNOSIS — Z5111 Encounter for antineoplastic chemotherapy: Secondary | ICD-10-CM

## 2019-10-29 DIAGNOSIS — G4700 Insomnia, unspecified: Secondary | ICD-10-CM

## 2019-10-29 DIAGNOSIS — C7931 Secondary malignant neoplasm of brain: Secondary | ICD-10-CM

## 2019-10-29 MED ORDER — GABAPENTIN 300 MG PO CAPS: 600.0000 mg | ORAL_CAPSULE | Freq: Three times a day (TID) | ORAL | 3 refills | Status: DC

## 2019-10-29 NOTE — Telephone Encounter (Signed)
Called in [place] refill to [facility]

## 2019-11-07 ENCOUNTER — Inpatient Hospital Stay: Payer: Medicare Other | Attending: Internal Medicine

## 2019-11-07 ENCOUNTER — Inpatient Hospital Stay: Payer: Medicare Other

## 2019-11-07 ENCOUNTER — Other Ambulatory Visit: Payer: Self-pay

## 2019-11-07 ENCOUNTER — Other Ambulatory Visit: Payer: Medicare Other

## 2019-11-07 ENCOUNTER — Ambulatory Visit: Payer: Medicare Other | Admitting: Internal Medicine

## 2019-11-07 ENCOUNTER — Inpatient Hospital Stay (HOSPITAL_BASED_OUTPATIENT_CLINIC_OR_DEPARTMENT_OTHER): Payer: Medicare Other | Admitting: Internal Medicine

## 2019-11-07 ENCOUNTER — Ambulatory Visit: Payer: Medicare Other

## 2019-11-07 ENCOUNTER — Encounter: Payer: Self-pay | Admitting: Internal Medicine

## 2019-11-07 VITALS — BP 129/88 | HR 81 | Temp 97.8°F | Resp 18 | Ht 68.0 in | Wt 191.6 lb

## 2019-11-07 DIAGNOSIS — C3411 Malignant neoplasm of upper lobe, right bronchus or lung: Secondary | ICD-10-CM | POA: Diagnosis present

## 2019-11-07 DIAGNOSIS — G629 Polyneuropathy, unspecified: Secondary | ICD-10-CM | POA: Diagnosis not present

## 2019-11-07 DIAGNOSIS — C7800 Secondary malignant neoplasm of unspecified lung: Secondary | ICD-10-CM

## 2019-11-07 DIAGNOSIS — C7931 Secondary malignant neoplasm of brain: Secondary | ICD-10-CM | POA: Diagnosis not present

## 2019-11-07 DIAGNOSIS — Z79899 Other long term (current) drug therapy: Secondary | ICD-10-CM | POA: Diagnosis not present

## 2019-11-07 DIAGNOSIS — Z5112 Encounter for antineoplastic immunotherapy: Secondary | ICD-10-CM | POA: Diagnosis not present

## 2019-11-07 DIAGNOSIS — I10 Essential (primary) hypertension: Secondary | ICD-10-CM | POA: Diagnosis not present

## 2019-11-07 DIAGNOSIS — C3412 Malignant neoplasm of upper lobe, left bronchus or lung: Secondary | ICD-10-CM | POA: Insufficient documentation

## 2019-11-07 DIAGNOSIS — C3490 Malignant neoplasm of unspecified part of unspecified bronchus or lung: Secondary | ICD-10-CM

## 2019-11-07 DIAGNOSIS — R5382 Chronic fatigue, unspecified: Secondary | ICD-10-CM

## 2019-11-07 LAB — CMP (CANCER CENTER ONLY)
ALT: 13 U/L (ref 0–44)
AST: 15 U/L (ref 15–41)
Albumin: 4.2 g/dL (ref 3.5–5.0)
Alkaline Phosphatase: 82 U/L (ref 38–126)
Anion gap: 10 (ref 5–15)
BUN: 15 mg/dL (ref 8–23)
CO2: 24 mmol/L (ref 22–32)
Calcium: 8.9 mg/dL (ref 8.9–10.3)
Chloride: 106 mmol/L (ref 98–111)
Creatinine: 1 mg/dL (ref 0.61–1.24)
GFR, Est AFR Am: >60 mL/min (ref >60)
GFR, Estimated: >60 mL/min (ref >60)
Glucose, Bld: 97 mg/dL (ref 70–99)
Potassium: 4.6 mmol/L (ref 3.5–5.1)
Sodium: 140 mmol/L (ref 135–145)
Total Bilirubin: 0.5 mg/dL (ref 0.3–1.2)
Total Protein: 7.7 g/dL (ref 6.5–8.1)

## 2019-11-07 LAB — CBC WITH DIFFERENTIAL (CANCER CENTER ONLY)
Abs Immature Granulocytes: 0.03 10*3/uL (ref 0.00–0.07)
Basophils Absolute: 0.1 10*3/uL (ref 0.0–0.1)
Basophils Relative: 1 %
Eosinophils Absolute: 0.3 10*3/uL (ref 0.0–0.5)
Eosinophils Relative: 4 %
HCT: 46.2 % (ref 39.0–52.0)
Hemoglobin: 15.2 g/dL (ref 13.0–17.0)
Immature Granulocytes: 0 %
Lymphocytes Relative: 34 %
Lymphs Abs: 3.1 10*3/uL (ref 0.7–4.0)
MCH: 29 pg (ref 26.0–34.0)
MCHC: 32.9 g/dL (ref 30.0–36.0)
MCV: 88.2 fL (ref 80.0–100.0)
Monocytes Absolute: 0.6 10*3/uL (ref 0.1–1.0)
Monocytes Relative: 6 %
Neutro Abs: 5 10*3/uL (ref 1.7–7.7)
Neutrophils Relative %: 55 %
Platelet Count: 298 10*3/uL (ref 150–400)
RBC: 5.24 MIL/uL (ref 4.22–5.81)
RDW: 13.9 % (ref 11.5–15.5)
WBC Count: 9.2 10*3/uL (ref 4.0–10.5)
nRBC: 0 % (ref 0.0–0.2)

## 2019-11-07 LAB — TSH: TSH: 1.443 u[IU]/mL (ref 0.320–4.118)

## 2019-11-07 MED ORDER — SODIUM CHLORIDE 0.9 % IV SOLN
Freq: Once | INTRAVENOUS | Status: AC
  Filled 2019-11-07: qty 250

## 2019-11-07 MED ORDER — SODIUM CHLORIDE 0.9 % IV SOLN
480.0000 mg | Freq: Once | INTRAVENOUS | Status: AC
  Administered 2019-11-07: 13:00:00 480 mg via INTRAVENOUS
  Filled 2019-11-07: qty 48

## 2019-11-07 NOTE — Progress Notes (Signed)
[place] [phone]   [phone]  OFFICE PROGRESS NOTE  System, Pcp Not In No address on file  DIAGNOSIS: Stage IV (T3, N0, M1 a) non-small cell lung cancer, squamous cell carcinoma presented with multiple bilateral pulmonary nodules diagnosed in April 2018.  PRIOR THERAPY:  Systemic chemotherapy with carboplatin for AUC of 5 and paclitaxel 175 MG/M2 every 3 weeks with Neulasta support. Status post 6 cycles.  CURRENT THERAPY: Second line treatment with immunotherapy with Nivolumab 480 mg IV every 4 weeks.  First cycle November 14, 2017.  Status post 23 cycles.  INTERVAL HISTORY: [name] [name] 69 y.o. male returns to the clinic today for follow-up visit.  The patient is feeling fine today with no concerning complaints.  He denied having any chest pain, shortness of breath, cough or hemoptysis.  He denied having any fever or chills.  He has no nausea, vomiting, diarrhea or constipation.  He has no headache or visual changes.  He continues to tolerate his treatment with nivolumab fairly well.  The patient is here today for evaluation before starting cycle #24.  MEDICAL HISTORY: Past Medical History:  Diagnosis Date  . Depression 01/29/2017  . Dyspnea   . Encounter for antineoplastic chemotherapy 01/29/2017  . Goals of care, counseling/discussion 01/29/2017  . Hypertension   . Metastatic squamous cell carcinoma to lung, unspecified laterality ([place]) 01/28/2017  . Stroke [facility])     ALLERGIES:  has No Known Allergies.  MEDICATIONS:  Current Outpatient Medications  Medication Sig Dispense Refill  . acetaminophen (TYLENOL) 325 MG tablet You can take 2 every 4 hours as needed.  You can buy it over the counter DO NOT TAKE MORE THAN 4000 MG OF TYLENOL PER DAY.  IT CAN HARM YOUR LIVER.    Marland Kitchen amitriptyline (ELAVIL) 100 MG tablet Take 1 tablet (100 mg total) by mouth at bedtime. 30 tablet 3  . aspirin EC 81 MG tablet Take 81 mg by mouth daily.    Marland Kitchen  gabapentin (NEURONTIN) 300 MG capsule Take 2 capsules (600 mg total) by mouth 3 (three) times daily. 90 capsule 3  . loperamide (IMODIUM) 2 MG capsule Take 1 capsule (2 mg total) by mouth 2 (two) times daily. Also available over the counter. You can take 1 extra dose in the afternoon if diarrhea is not improving. 60 capsule 4  . mesalamine (APRISO) 0.375 g 24 hr capsule Take 4 capsules by mouth daily.    . metoprolol succinate (TOPROL-XL) 25 MG 24 hr tablet Take 1 tablet (25 mg total) by mouth daily. 30 tablet 4  . mirtazapine (REMERON) 30 MG tablet TAKE 1 TABLET BY MOUTH AT BEDTIME 30 tablet 0  . nortriptyline (PAMELOR) 10 MG capsule Take 10 mg by mouth 2 (two) times daily.     . pantoprazole (PROTONIX) 40 MG tablet Take 1 tablet (40 mg total) by mouth 2 (two) times daily. 60 tablet 1  . simvastatin (ZOCOR) 20 MG tablet Take 20 mg by mouth daily.    . temazepam (RESTORIL) 30 MG capsule TAKE 1 CAPSULE BY MOUTH AT BEDTIME AS NEEDED FOR SLEEP 30 capsule 0   No current facility-administered medications for this visit.    SURGICAL HISTORY:  Past Surgical History:  Procedure Laterality Date  . BACK SURGERY     cyst removal  . COLONOSCOPY    . LAPAROTOMY N/A 02/25/2017   Procedure: EXPLORATORY LAPAROTOMY MODIFIED GRAHAM'S PATCH;  Surgeon: Kieth Brightly Arta Bruce, MD;  Location: [facility];  Service: General;  Laterality: N/A;  . MINOR PLACEMENT OF FIDUCIAL N/A 01/19/2017   Procedure: MINOR PLACEMENT OF FIDUCIAL x 6;  Surgeon: Collene Gobble, MD;  Location: [place];  Service: Thoracic;  Laterality: N/A;  . VIDEO BRONCHOSCOPY WITH ENDOBRONCHIAL NAVIGATION N/A 01/19/2017   Procedure: VIDEO BRONCHOSCOPY WITH ENDOBRONCHIAL NAVIGATION;  Surgeon: Collene Gobble, MD;  Location: [place];  Service: Thoracic;  Laterality: N/A;    REVIEW OF SYSTEMS:  A comprehensive review of systems was negative.   PHYSICAL EXAMINATION: General appearance: alert, cooperative and no distress Head: Normocephalic, without obvious  abnormality, atraumatic Neck: no adenopathy, no JVD, supple, symmetrical, trachea midline and thyroid not enlarged, symmetric, no tenderness/mass/nodules Lymph nodes: Cervical, supraclavicular, and axillary nodes normal. Resp: clear to auscultation bilaterally Back: symmetric, no curvature. ROM normal. No CVA tenderness. Cardio: regular rate and rhythm, S1, S2 normal, no murmur, click, rub or gallop GI: soft, non-tender; bowel sounds normal; no masses,  no organomegaly Extremities: extremities normal, atraumatic, no cyanosis or edema  ECOG PERFORMANCE STATUS: 1 - Symptomatic but completely ambulatory   Blood pressure 129/88, pulse 81, temperature 97.8 F (36.6 C), temperature source Oral, resp. rate 18, height 5\' 8"  (1.727 m), weight 191 lb 9.6 oz (86.9 kg), SpO2 95 %.  LABORATORY DATA: Lab Results  Component Value Date   WBC 9.2 11/07/2019   HGB 15.2 11/07/2019   HCT 46.2 11/07/2019   MCV 88.2 11/07/2019   PLT 298 11/07/2019      Chemistry      Component Value Date/Time   NA 139 10/11/2019 1307   NA 139 10/06/2017 1052   K 4.2 10/11/2019 1307   K 4.2 10/06/2017 1052   CL 102 10/11/2019 1307   CO2 27 10/11/2019 1307   CO2 25 10/06/2017 1052   BUN 11 10/11/2019 1307   BUN 13.2 10/06/2017 1052   CREATININE 0.90 10/11/2019 1307   CREATININE 0.9 10/06/2017 1052      Component Value Date/Time   CALCIUM 9.0 10/11/2019 1307   CALCIUM 9.5 10/06/2017 1052   ALKPHOS 67 10/11/2019 1307   ALKPHOS 68 10/06/2017 1052   AST 15 10/11/2019 1307   AST 14 10/06/2017 1052   ALT 12 10/11/2019 1307   ALT 13 10/06/2017 1052   BILITOT 0.3 10/11/2019 1307   BILITOT 0.44 10/06/2017 1052       RADIOGRAPHIC STUDIES: CT Chest W Contrast  Result Date: 10/08/2019 CLINICAL DATA:  Non-small cell lung cancer, chemotherapy/XRT complete, immunotherapy ongoing, occasional shortness of breath EXAM: CT CHEST, ABDOMEN, AND PELVIS WITH CONTRAST TECHNIQUE: Multidetector CT imaging of the chest,  abdomen and pelvis was performed following the standard protocol during bolus administration of intravenous contrast. CONTRAST:  155mL OMNIPAQUE IOHEXOL 300 MG/ML  SOLN COMPARISON:  06/18/2019 FINDINGS: CT CHEST FINDINGS Cardiovascular: Heart is normal in size. No pericardial effusion. Ectasia of the ascending thoracic aorta, measuring 4.0 cm. Atherosclerotic calcifications of the aortic arch. Mild coronary atherosclerosis the LAD and right coronary artery. Mediastinum/Nodes: No suspicious mediastinal, hilar, or axillary lymphadenopathy. 6 mm short axis low right paratracheal node, unchanged. Visualized thyroid is unremarkable. Lungs/Pleura: Biapical pleural-parenchymal scarring. Mild centrilobular and paraseptal emphysematous changes, upper lung predominant. Bilateral pulmonary nodules, including: --9 mm posterior right upper lobe nodule (series 6/image 41), unchanged --6 mm nodule in the posterior right upper lobe (series 6/image 57), previously 7 mm --8 x 14 mm irregular nodule in the lateral left upper lobe (series 6/image 61), previously 5 x 14 mm, similar --6 mm nodule  in the lateral right upper lobe (series 6/image 61), new No focal consolidation. Mild subpleural reticulation in the left upper lobe, favoring radiation changes. Mild peribronchial thickening in the bilateral upper lobes. No pleural effusion or pneumothorax. Musculoskeletal: Mild degenerative changes of the mid thoracic spine. CT ABDOMEN PELVIS FINDINGS Hepatobiliary: Mildly nodular left hepatic lobe contour. No focal hepatic lesion is seen. Suspected layering gallstones and/or gallbladder sludge (series 2/image 73). No intrahepatic or extrahepatic ductal dilatation. Pancreas: Within normal limits. Spleen: Within normal limits. Adrenals/Urinary Tract: Stable left adrenal nodularity, measuring at least 1.4 cm, favored to reflect benign adrenal adenomas. Mild thickening of the right adrenal gland. Kidneys are within normal limits. No  hydronephrosis. Bladder is thick-walled. Stomach/Bowel: Stomach is within normal limits. No evidence of bowel obstruction. Normal appendix (series 2/image 102). Left colonic diverticulosis, without evidence of diverticulitis. Vascular/Lymphatic: Fusiform ectasia of the infrarenal abdominal aorta, measuring 3.3 x 3.0 cm (series 2/image 81), unchanged. Atherosclerotic calcifications of the abdominal aorta and branch vessels. No suspicious abdominopelvic lymphadenopathy. Reproductive: Prostate is unremarkable. Other: No abdominopelvic ascites. Small right and tiny left fat containing inguinal hernias. Musculoskeletal: Visualized osseous structures are within normal limits. IMPRESSION: Bilateral pulmonary nodules, most of which are grossly unchanged. A single 6 mm subpleural nodule in the right upper lobe may be new/progressive. Continued attention on follow-up is suggested. No findings specific for metastatic disease in the abdomen/pelvis. Ectasia of the ascending thoracic aorta, measuring 4.0 cm. 3.3 cm infrarenal abdominal aortic aneurysm. In a patient on known cancer surveillance, these are amenable to attention follow-up. Additional stable ancillary findings as above. Electronically Signed   By: Julian Hy M.D.   On: 10/08/2019 14:25   CT Abdomen Pelvis W Contrast  Result Date: 10/08/2019 CLINICAL DATA:  Non-small cell lung cancer, chemotherapy/XRT complete, immunotherapy ongoing, occasional shortness of breath EXAM: CT CHEST, ABDOMEN, AND PELVIS WITH CONTRAST TECHNIQUE: Multidetector CT imaging of the chest, abdomen and pelvis was performed following the standard protocol during bolus administration of intravenous contrast. CONTRAST:  167mL OMNIPAQUE IOHEXOL 300 MG/ML  SOLN COMPARISON:  06/18/2019 FINDINGS: CT CHEST FINDINGS Cardiovascular: Heart is normal in size. No pericardial effusion. Ectasia of the ascending thoracic aorta, measuring 4.0 cm. Atherosclerotic calcifications of the aortic arch. Mild  coronary atherosclerosis the LAD and right coronary artery. Mediastinum/Nodes: No suspicious mediastinal, hilar, or axillary lymphadenopathy. 6 mm short axis low right paratracheal node, unchanged. Visualized thyroid is unremarkable. Lungs/Pleura: Biapical pleural-parenchymal scarring. Mild centrilobular and paraseptal emphysematous changes, upper lung predominant. Bilateral pulmonary nodules, including: --9 mm posterior right upper lobe nodule (series 6/image 41), unchanged --6 mm nodule in the posterior right upper lobe (series 6/image 57), previously 7 mm --8 x 14 mm irregular nodule in the lateral left upper lobe (series 6/image 61), previously 5 x 14 mm, similar --6 mm nodule in the lateral right upper lobe (series 6/image 61), new No focal consolidation. Mild subpleural reticulation in the left upper lobe, favoring radiation changes. Mild peribronchial thickening in the bilateral upper lobes. No pleural effusion or pneumothorax. Musculoskeletal: Mild degenerative changes of the mid thoracic spine. CT ABDOMEN PELVIS FINDINGS Hepatobiliary: Mildly nodular left hepatic lobe contour. No focal hepatic lesion is seen. Suspected layering gallstones and/or gallbladder sludge (series 2/image 73). No intrahepatic or extrahepatic ductal dilatation. Pancreas: Within normal limits. Spleen: Within normal limits. Adrenals/Urinary Tract: Stable left adrenal nodularity, measuring at least 1.4 cm, favored to reflect benign adrenal adenomas. Mild thickening of the right adrenal gland. Kidneys are within normal limits. No hydronephrosis. Bladder is thick-walled.  Stomach/Bowel: Stomach is within normal limits. No evidence of bowel obstruction. Normal appendix (series 2/image 102). Left colonic diverticulosis, without evidence of diverticulitis. Vascular/Lymphatic: Fusiform ectasia of the infrarenal abdominal aorta, measuring 3.3 x 3.0 cm (series 2/image 81), unchanged. Atherosclerotic calcifications of the abdominal aorta and  branch vessels. No suspicious abdominopelvic lymphadenopathy. Reproductive: Prostate is unremarkable. Other: No abdominopelvic ascites. Small right and tiny left fat containing inguinal hernias. Musculoskeletal: Visualized osseous structures are within normal limits. IMPRESSION: Bilateral pulmonary nodules, most of which are grossly unchanged. A single 6 mm subpleural nodule in the right upper lobe may be new/progressive. Continued attention on follow-up is suggested. No findings specific for metastatic disease in the abdomen/pelvis. Ectasia of the ascending thoracic aorta, measuring 4.0 cm. 3.3 cm infrarenal abdominal aortic aneurysm. In a patient on known cancer surveillance, these are amenable to attention follow-up. Additional stable ancillary findings as above. Electronically Signed   By: Julian Hy M.D.   On: 10/08/2019 14:25    ASSESSMENT AND PLAN:  This is a very pleasant 69 years old white male with stage IV non-small cell lung cancer, squamous cell carcinoma presented with multiple bilateral pulmonary nodules. The patient was treated with systemic chemotherapy with carboplatin and paclitaxel status post 6 cycles. He tolerated the last cycle of his treatment well except for the persistent peripheral neuropathy. The patient has been in observation for the last 3 months. He had a repeat CT scan of the chest, abdomen and pelvis performed recently.  Has a scan showed mild interval enlargement of left upper lobe as well as right upper lobe pulmonary nodules concerning for disease progression. The patient was started on treatment with immunotherapy with Nivolumab 480 mg IV every 4 weeks status post 23 cycles.   The patient continues to tolerate his treatment well with no concerning adverse effects. I recommended for him to proceed with cycle #24 today as planned. For the peripheral neuropathy he will continue his current treatment with Neurontin 600 mg p.o. 3 times daily. He will come back for  follow-up visit in 4 weeks for evaluation before starting cycle #25. The patient was advised to call immediately if he has any concerning symptoms in the interval. The patient voices understanding of current disease status and treatment options and is in agreement with the current care plan. All questions were answered. The patient knows to call the clinic with any problems, questions or concerns. We can certainly see the patient much sooner if necessary.  Disclaimer: This note was dictated with voice recognition software. Similar sounding words can inadvertently be transcribed and may not be corrected upon review.

## 2019-11-07 NOTE — Patient Instructions (Signed)
[facility] Discharge Instructions for Patients Receiving Chemotherapy  Today you received the following chemotherapy agents Opdivo  To help prevent nausea and vomiting after your treatment, we encourage you to take your nausea medication as directed   If you develop nausea and vomiting that is not controlled by your nausea medication, call the clinic.   BELOW ARE SYMPTOMS THAT SHOULD BE REPORTED IMMEDIATELY:  *FEVER GREATER THAN 100.5 F  *CHILLS WITH OR WITHOUT FEVER  NAUSEA AND VOMITING THAT IS NOT CONTROLLED WITH YOUR NAUSEA MEDICATION  *UNUSUAL SHORTNESS OF BREATH  *UNUSUAL BRUISING OR BLEEDING  TENDERNESS IN MOUTH AND THROAT WITH OR WITHOUT PRESENCE OF ULCERS  *URINARY PROBLEMS  *BOWEL PROBLEMS  UNUSUAL RASH Items with * indicate a potential emergency and should be followed up as soon as possible.  Feel free to call the clinic should you have any questions or concerns. The clinic phone number is [phone].  Please show the CHEMO ALERT CARD at check-in to the Emergency Department and triage nurse.   

## 2019-11-07 NOTE — Patient Instructions (Signed)
Steps to Quit Smoking Smoking tobacco is the leading cause of preventable death. It can affect almost every organ in the body. Smoking puts you and people around you at risk for many serious, long-lasting (chronic) diseases. Quitting smoking can be hard, but it is one of the best things that you can do for your health. It is never too late to quit. How do I get ready to quit? When you decide to quit smoking, make a plan to help you succeed. Before you quit:  Pick a date to quit. Set a date within the next 2 weeks to give you time to prepare.  Write down the reasons why you are quitting. Keep this list in places where you will see it often.  Tell your family, friends, and co-workers that you are quitting. Their support is important.  Talk with your doctor about the choices that may help you quit.  Find out if your health insurance will pay for these treatments.  Know the people, places, things, and activities that make you want to smoke (triggers). Avoid them. What first steps can I take to quit smoking?  Throw away all cigarettes at home, at work, and in your car.  Throw away the things that you use when you smoke, such as ashtrays and lighters.  Clean your car. Make sure to empty the ashtray.  Clean your home, including curtains and carpets. What can I do to help me quit smoking? Talk with your doctor about taking medicines and seeing a counselor at the same time. You are more likely to succeed when you do both.  If you are pregnant or breastfeeding, talk with your doctor about counseling or other ways to quit smoking. Do not take medicine to help you quit smoking unless your doctor tells you to do so. To quit smoking: Quit right away  Quit smoking totally, instead of slowly cutting back on how much you smoke over a period of time.  Go to counseling. You are more likely to quit if you go to counseling sessions regularly. Take medicine You may take medicines to help you quit. Some  medicines need a prescription, and some you can buy over-the-counter. Some medicines may contain a drug called nicotine to replace the nicotine in cigarettes. Medicines may:  Help you to stop having the desire to smoke (cravings).  Help to stop the problems that come when you stop smoking (withdrawal symptoms). Your doctor may ask you to use:  Nicotine patches, gum, or lozenges.  Nicotine inhalers or sprays.  Non-nicotine medicine that is taken by mouth. Find resources Find resources and other ways to help you quit smoking and remain smoke-free after you quit. These resources are most helpful when you use them often. They include:  Online chats with a counselor.  Phone quitlines.  Printed self-help materials.  Support groups or group counseling.  Text messaging programs.  Mobile phone apps. Use apps on your mobile phone or tablet that can help you stick to your quit plan. There are many free apps for mobile phones and tablets as well as websites. Examples include Quit Guide from the CDC and smokefree.gov  What things can I do to make it easier to quit?   Talk to your family and friends. Ask them to support and encourage you.  Call a phone quitline (1-800-QUIT-NOW), reach out to support groups, or work with a counselor.  Ask people who smoke to not smoke around you.  Avoid places that make you want to smoke,   such as: ? Bars. ? Parties. ? Smoke-break areas at work.  Spend time with people who do not smoke.  Lower the stress in your life. Stress can make you want to smoke. Try these things to help your stress: ? Getting regular exercise. ? Doing deep-breathing exercises. ? Doing yoga. ? Meditating. ? Doing a body scan. To do this, close your eyes, focus on one area of your body at a time from head to toe. Notice which parts of your body are tense. Try to relax the muscles in those areas. How will I feel when I quit smoking? Day 1 to 3 weeks Within the first 24 hours,  you may start to have some problems that come from quitting tobacco. These problems are very bad 2-3 days after you quit, but they do not often last for more than 2-3 weeks. You may get these symptoms:  Mood swings.  Feeling restless, nervous, angry, or annoyed.  Trouble concentrating.  Dizziness.  Strong desire for high-sugar foods and nicotine.  Weight gain.  Trouble pooping (constipation).  Feeling like you may vomit (nausea).  Coughing or a sore throat.  Changes in how the medicines that you take for other issues work in your body.  Depression.  Trouble sleeping (insomnia). Week 3 and afterward After the first 2-3 weeks of quitting, you may start to notice more positive results, such as:  Better sense of smell and taste.  Less coughing and sore throat.  Slower heart rate.  Lower blood pressure.  Clearer skin.  Better breathing.  Fewer sick days. Quitting smoking can be hard. Do not give up if you fail the first time. Some people need to try a few times before they succeed. Do your best to stick to your quit plan, and talk with your doctor if you have any questions or concerns. Summary  Smoking tobacco is the leading cause of preventable death. Quitting smoking can be hard, but it is one of the best things that you can do for your health.  When you decide to quit smoking, make a plan to help you succeed.  Quit smoking right away, not slowly over a period of time.  When you start quitting, seek help from your doctor, family, or friends. This information is not intended to replace advice given to you by your health care provider. Make sure you discuss any questions you have with your health care provider. Document Revised: 06/29/2019 Document Reviewed: 12/23/2018 Elsevier Patient Education  2020 [employer] Inc.  

## 2019-11-09 ENCOUNTER — Telehealth: Payer: Self-pay | Admitting: Internal Medicine

## 2019-11-09 NOTE — Telephone Encounter (Signed)
Scheduled per los. Called and left msg. Mailed printout  °

## 2019-11-29 ENCOUNTER — Other Ambulatory Visit: Payer: Self-pay | Admitting: Internal Medicine

## 2019-11-29 DIAGNOSIS — C7800 Secondary malignant neoplasm of unspecified lung: Secondary | ICD-10-CM

## 2019-11-29 DIAGNOSIS — G4700 Insomnia, unspecified: Secondary | ICD-10-CM

## 2019-12-03 ENCOUNTER — Telehealth: Payer: Self-pay | Admitting: Medical Oncology

## 2019-12-03 NOTE — Telephone Encounter (Signed)
Cancelled appt Pt working and is currently driving in [place] and cannot make appts 12/05/19. He said he will call back and reschedule.

## 2019-12-05 ENCOUNTER — Ambulatory Visit: Payer: Medicare Other

## 2019-12-05 ENCOUNTER — Other Ambulatory Visit: Payer: Medicare Other

## 2019-12-05 ENCOUNTER — Ambulatory Visit: Payer: Medicare Other | Admitting: Physician Assistant

## 2019-12-07 ENCOUNTER — Telehealth: Payer: Self-pay | Admitting: Internal Medicine

## 2019-12-07 NOTE — Telephone Encounter (Signed)
R/s appt per 2/17 sch message - unable to reach pt . Left message with appt date and time

## 2019-12-12 ENCOUNTER — Ambulatory Visit: Payer: Self-pay

## 2019-12-12 ENCOUNTER — Other Ambulatory Visit: Payer: Medicare Other

## 2019-12-12 ENCOUNTER — Ambulatory Visit: Payer: Self-pay | Admitting: Internal Medicine

## 2019-12-25 ENCOUNTER — Telehealth: Payer: Self-pay | Admitting: Internal Medicine

## 2019-12-25 ENCOUNTER — Telehealth: Payer: Self-pay | Admitting: Medical Oncology

## 2019-12-25 NOTE — Telephone Encounter (Signed)
I talk with patient regarding reschedule from 3/17 to 3/25

## 2019-12-25 NOTE — Telephone Encounter (Signed)
Request appt changes to accommodate his work .Schedule message sent.

## 2019-12-28 ENCOUNTER — Other Ambulatory Visit: Payer: Self-pay | Admitting: Internal Medicine

## 2019-12-28 DIAGNOSIS — G4700 Insomnia, unspecified: Secondary | ICD-10-CM

## 2019-12-28 DIAGNOSIS — C7800 Secondary malignant neoplasm of unspecified lung: Secondary | ICD-10-CM

## 2019-12-31 ENCOUNTER — Other Ambulatory Visit: Payer: Self-pay | Admitting: Internal Medicine

## 2019-12-31 ENCOUNTER — Telehealth: Payer: Self-pay | Admitting: Medical Oncology

## 2019-12-31 DIAGNOSIS — C7800 Secondary malignant neoplasm of unspecified lung: Secondary | ICD-10-CM

## 2019-12-31 DIAGNOSIS — G4700 Insomnia, unspecified: Secondary | ICD-10-CM

## 2019-12-31 NOTE — Telephone Encounter (Signed)
Per Dr Julien Nordmann , I told [name] [name] that Julien Nordmann  is not going to refill his medications. He will address the refills at his next appt.

## 2019-12-31 NOTE — Telephone Encounter (Signed)
Refills-Pt requesting full or partial refills for mirtazapine and temazepam.  He has no showed or missed  > 6 appts.   He is scheduled to see Mohamed on 01/10/20.

## 2020-01-02 ENCOUNTER — Ambulatory Visit: Payer: Medicare Other | Admitting: Internal Medicine

## 2020-01-02 ENCOUNTER — Ambulatory Visit: Payer: Medicare Other

## 2020-01-02 ENCOUNTER — Other Ambulatory Visit: Payer: Medicare Other

## 2020-01-04 NOTE — Progress Notes (Signed)
Pharmacist Chemotherapy Monitoring - Follow Up Assessment    I verify that I have reviewed each item in the below checklist:  . Regimen for the patient is scheduled for the appropriate day and plan matches scheduled date. Marland Kitchen Appropriate non-routine labs are ordered dependent on drug ordered. . If applicable, additional medications reviewed and ordered per protocol based on lifetime cumulative doses and/or treatment regimen.   Plan for follow-up and/or issues identified: No . I-vent associated with next due treatment: No . MD and/or nursing notified: No  [name] [name] 01/04/2020 8:09 AM

## 2020-01-09 NOTE — Progress Notes (Signed)
[place] OFFICE PROGRESS NOTE  System, Pcp Not In No address on file  DIAGNOSIS: Stage IV (T3, N0, M1 a) non-small cell lung cancer, squamous cell carcinoma presented with multiple bilateral pulmonary nodules diagnosed in April 2018.  PRIOR THERAPY: Systemic chemotherapy with carboplatin for AUC of 5 and paclitaxel 175 MG/M2 every 3 weeks with Neulasta support. Status post 6 cycles.  CURRENT THERAPY: Second line treatment with immunotherapy was Nivolumab 480 mg IV every 4 weeks. First cycle November 14, 2017. Status post24cycles  INTERVAL HISTORY: [name] [name] 69 y.o. male returns to the clinic for a follow up visit. The patient is feeling well today without any concerning complaints except for left sided chest and back pain. The patient states it feels sore and has been feeling sore for several weeks. The pain is exacerbated by palpation of the rib cage on the chest and back. The patient denies any recent heavy lifting. He denies coughing more than normal. He has not tried to take anything for the pain except for the tylenol that he reportedly takes regularly for other aches and pains. The patient continues to tolerate treatment with immunotherapy with nivolumab well without any adverse effects. He missed his appointment last month due to being out of town for work. The patient is a Administrator. His last treatment was on 11/07/19. Denies any fever, chills, night sweats, or weight loss. Denies any hemoptysis. He reports his baseline shortness of breath with exertion. Denies diaphoresis. He denies any significant or unusual cough. Denies any nausea, vomiting, or constipation. He has baseline diarrhea for which he takes imodium. He states he has about 1-2 loose stools daily. Denies any headache or visual changes. Denies any rashes or skin changes. He is requesting a refill for Remeron and Restoril for his insomnia.  He is here for evaluation before starting cycle #25.    MEDICAL  HISTORY: Past Medical History:  Diagnosis Date  . Depression 01/29/2017  . Dyspnea   . Encounter for antineoplastic chemotherapy 01/29/2017  . Goals of care, counseling/discussion 01/29/2017  . Hypertension   . Metastatic squamous cell carcinoma to lung, unspecified laterality ([place]) 01/28/2017  . Stroke [facility])     ALLERGIES:  has No Known Allergies.  MEDICATIONS:  Current Outpatient Medications  Medication Sig Dispense Refill  . acetaminophen (TYLENOL) 325 MG tablet You can take 2 every 4 hours as needed.  You can buy it over the counter DO NOT TAKE MORE THAN 4000 MG OF TYLENOL PER DAY.  IT CAN HARM YOUR LIVER.    Marland Kitchen amitriptyline (ELAVIL) 100 MG tablet Take 1 tablet (100 mg total) by mouth at bedtime. 30 tablet 3  . aspirin EC 81 MG tablet Take 81 mg by mouth daily.    Marland Kitchen gabapentin (NEURONTIN) 300 MG capsule Take 2 capsules (600 mg total) by mouth 3 (three) times daily. 90 capsule 3  . loperamide (IMODIUM) 2 MG capsule Take 1 capsule (2 mg total) by mouth 2 (two) times daily. Also available over the counter. You can take 1 extra dose in the afternoon if diarrhea is not improving. 60 capsule 4  . mesalamine (APRISO) 0.375 g 24 hr capsule Take 4 capsules by mouth daily.    . metoprolol succinate (TOPROL-XL) 25 MG 24 hr tablet Take 1 tablet (25 mg total) by mouth daily. 30 tablet 4  . mirtazapine (REMERON) 30 MG tablet TAKE 1 TABLET BY MOUTH AT BEDTIME 30 tablet 0  . nortriptyline (PAMELOR) 10 MG capsule Take  10 mg by mouth 2 (two) times daily.     . pantoprazole (PROTONIX) 40 MG tablet Take 1 tablet (40 mg total) by mouth 2 (two) times daily. 60 tablet 1  . simvastatin (ZOCOR) 20 MG tablet Take 20 mg by mouth daily.    . temazepam (RESTORIL) 30 MG capsule TAKE 1 CAPSULE BY MOUTH AT BEDTIME AS NEEDED FOR SLEEP 30 capsule 0   No current facility-administered medications for this visit.   Facility-Administered Medications Ordered in Other Visits  Medication Dose Route Frequency Provider Last  Rate Last Admin  . heparin lock flush 100 unit/mL  500 Units Intracatheter Once PRN [name], MD      . nivolumab (OPDIVO) 480 mg in sodium chloride 0.9 % 100 mL chemo infusion  480 mg Intravenous Once [name], MD      . sodium chloride flush (NS) 0.9 % injection 10 mL  10 mL Intracatheter PRN [name], MD        SURGICAL HISTORY:  Past Surgical History:  Procedure Laterality Date  . BACK SURGERY     cyst removal  . COLONOSCOPY    . LAPAROTOMY N/A 02/25/2017   Procedure: EXPLORATORY LAPAROTOMY MODIFIED GRAHAM'S PATCH;  Surgeon: Kinsinger, Arta Bruce, MD;  Location: [facility];  Service: General;  Laterality: N/A;  . MINOR PLACEMENT OF FIDUCIAL N/A 01/19/2017   Procedure: MINOR PLACEMENT OF FIDUCIAL x 6;  Surgeon: Collene Gobble, MD;  Location: [place];  Service: Thoracic;  Laterality: N/A;  . VIDEO BRONCHOSCOPY WITH ENDOBRONCHIAL NAVIGATION N/A 01/19/2017   Procedure: VIDEO BRONCHOSCOPY WITH ENDOBRONCHIAL NAVIGATION;  Surgeon: Collene Gobble, MD;  Location: [place];  Service: Thoracic;  Laterality: N/A;    REVIEW OF SYSTEMS:   Review of Systems  Constitutional: Negative for appetite change, chills, fatigue, fever and unexpected weight change.  HENT: Negative for mouth sores, nosebleeds, sore throat and trouble swallowing.   Eyes: Negative for eye problems and icterus.  Respiratory: Positive for baseline dyspnea on exertion. Negative for cough, hemoptysis, and wheezing.  Cardiovascular: Positive for left sided chest/back rib pain worse with palpation. Negative for leg swelling.  Gastrointestinal: Positive for baseline diarrhea. Negative for abdominal pain, constipation, nausea and vomiting.  Genitourinary: Negative for bladder incontinence, difficulty urinating, dysuria, frequency and hematuria.   Musculoskeletal: Negative for gait problem, neck pain and neck stiffness.  Skin: Negative for itching and rash.  Neurological: Negative for dizziness, extremity weakness, gait  problem, headaches, light-headedness and seizures.  Hematological: Negative for adenopathy. Does not bruise/bleed easily.  Psychiatric/Behavioral: Negative for confusion, depression and sleep disturbance. The patient is not nervous/anxious.     PHYSICAL EXAMINATION:  There were no vitals taken for this visit.  ECOG PERFORMANCE STATUS: 1 - Symptomatic but completely ambulatory  Physical Exam  Constitutional: Oriented to person, place, and time and well-developed, well-nourished, and in no distress.  HENT:  Head: Normocephalic and atraumatic.  Mouth/Throat: Oropharynx is clear and moist. No oropharyngeal exudate.  Eyes: Conjunctivae are normal. Right eye exhibits no discharge. Left eye exhibits no discharge. No scleral icterus.  Neck: Normal range of motion. Neck supple.  Cardiovascular: Normal rate, regular rhythm, normal heart sounds and intact distal pulses.   Pulmonary/Chest: Effort normal and breath sounds normal. No respiratory distress. No wheezes. No rales.  Abdominal: Soft. Bowel sounds are normal. Exhibits no distension and no mass. There is no tenderness.  Musculoskeletal: Normal range of motion. Exhibits no edema.  Lymphadenopathy:    No cervical adenopathy.  Neurological: Alert and  oriented to person, place, and time. Exhibits normal muscle tone. Gait normal. Coordination normal.  Skin: Skin is warm and dry. No rash noted. Not diaphoretic. No erythema. No pallor.  Psychiatric: Mood, memory and judgment normal.  Vitals reviewed.  LABORATORY DATA: Lab Results  Component Value Date   WBC 9.5 01/10/2020   HGB 13.8 01/10/2020   HCT 42.2 01/10/2020   MCV 89.2 01/10/2020   PLT 267 01/10/2020      Chemistry      Component Value Date/Time   NA 140 01/10/2020 1155   NA 139 10/06/2017 1052   K 4.4 01/10/2020 1155   K 4.2 10/06/2017 1052   CL 106 01/10/2020 1155   CO2 27 01/10/2020 1155   CO2 25 10/06/2017 1052   BUN 14 01/10/2020 1155   BUN 13.2 10/06/2017 1052    CREATININE 0.77 01/10/2020 1155   CREATININE 0.9 10/06/2017 1052      Component Value Date/Time   CALCIUM 8.7 (L) 01/10/2020 1155   CALCIUM 9.5 10/06/2017 1052   ALKPHOS 62 01/10/2020 1155   ALKPHOS 68 10/06/2017 1052   AST 11 (L) 01/10/2020 1155   AST 14 10/06/2017 1052   ALT 10 01/10/2020 1155   ALT 13 10/06/2017 1052   BILITOT 0.3 01/10/2020 1155   BILITOT 0.44 10/06/2017 1052       RADIOGRAPHIC STUDIES:  No results found.   ASSESSMENT/PLAN:  This is a very pleasant 69 year old Caucasian male with stage IV non-small cell lung cancer, squamous cell carcinoma. He presented with multiple bilateral pulmonary nodules. He was diagnosed in April 2018.  He underwent systemic chemotherapy with carboplatin and paclitaxel. He is status post 6 cycles. He tolerated it well except for the development of persistent peripheral neuropathy. He was then on observation for 3 months. He showed signs of disease progression.   He is currently being treated with immunotherapy with nivolumab 480 mg IV every 4 weeks. He is status post24cycles.  The patient was seen with Dr. Julien Nordmann. Labs were reviewed. Recommend that he proceed with cycle #25 today as scheduled.   I will arrange for a restaging CT scan of the chest, abdomen, and pelvis prior to his next cycle of treatment.  We will see him back for a follow up visit in 4 weeks for evaluation before starting cycle #26.   The patient will continue taking gabapentin and amitriptyline for his peripheral neuropathy.  I will refill the patients restoril for his insomnia. He knows not to take prior to driving.   The patient was advised to call immediately if he has any concerning symptoms in the interval. The patient voices understanding of current disease status and treatment options and is in agreement with the current care plan. All questions were answered. The patient knows to call the clinic with any problems, questions or concerns. We can  certainly see the patient much sooner if necessary     Orders Placed This Encounter  Procedures  . CT Chest W Contrast    Standing Status:   Future    Standing Expiration Date:   01/09/2021    Order Specific Question:   ** REASON FOR EXAM (FREE TEXT)    Answer:   Restaging Lung Cancer    Order Specific Question:   If indicated for the ordered procedure, I authorize the administration of contrast media per Radiology protocol    Answer:   Yes    Order Specific Question:   Preferred imaging location?    Answer:  [facility]    Order Specific Question:   Radiology Contrast Protocol - do NOT remove file path    Answer:   \\[email or web address]  . CT Abdomen Pelvis W Contrast    Standing Status:   Future    Standing Expiration Date:   01/09/2021    Order Specific Question:   ** REASON FOR EXAM (FREE TEXT)    Answer:   Restaging Lung Cancer    Order Specific Question:   If indicated for the ordered procedure, I authorize the administration of contrast media per Radiology protocol    Answer:   Yes    Order Specific Question:   Preferred imaging location?    Answer:   [facility]    Order Specific Question:   Is Oral Contrast requested for this exam?    Answer:   Yes, Per Radiology protocol    Order Specific Question:   Radiology Contrast Protocol - do NOT remove file path    Answer:   \\[email or web address]     Brenon Antosh L Adylin Hankey, PA-C 01/10/20  ADDENDUM: Hematology/Oncology Attending: I had a face-to-face encounter with the patient.  I recommended his care plan.  This is a very pleasant 69 years old white male with metastatic non-small cell lung cancer, squamous cell carcinoma status post induction systemic chemotherapy with carboplatin and paclitaxel status post 6 cycles.  He is currently undergoing treatment with immunotherapy with nivolumab 480 mg IV every 4 weeks status post 24 cycles.  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  He finally made it to the clinic today to resume his treatment.  I recommended for him to proceed with the treatment today as planned.  I strongly advised the patient to show up for his appointment as scheduled to avoid any interruption of his treatment and risk of disease progression. We will see him back for follow-up visit in 4 weeks for evaluation after repeating CT scan of the chest, abdomen pelvis for restaging of his disease. We gave the patient a refill of his insomnia medication. We will continue to monitor his TSH closely during his treatment with immunotherapy. The patient was advised to call immediately if he has any concerning symptoms in the interval.  Disclaimer: This note was dictated with voice recognition software. Similar sounding words can inadvertently be transcribed and may be missed upon review. Eilleen Kempf, MD 01/11/20

## 2020-01-10 ENCOUNTER — Inpatient Hospital Stay: Payer: Medicare Other | Attending: Internal Medicine

## 2020-01-10 ENCOUNTER — Other Ambulatory Visit: Payer: Self-pay

## 2020-01-10 ENCOUNTER — Inpatient Hospital Stay (HOSPITAL_BASED_OUTPATIENT_CLINIC_OR_DEPARTMENT_OTHER): Payer: Medicare Other | Admitting: Physician Assistant

## 2020-01-10 ENCOUNTER — Telehealth: Payer: Self-pay | Admitting: Physician Assistant

## 2020-01-10 ENCOUNTER — Inpatient Hospital Stay: Payer: Medicare Other

## 2020-01-10 VITALS — BP 135/76 | HR 67 | Resp 18

## 2020-01-10 DIAGNOSIS — G629 Polyneuropathy, unspecified: Secondary | ICD-10-CM | POA: Diagnosis not present

## 2020-01-10 DIAGNOSIS — G4700 Insomnia, unspecified: Secondary | ICD-10-CM | POA: Insufficient documentation

## 2020-01-10 DIAGNOSIS — R5382 Chronic fatigue, unspecified: Secondary | ICD-10-CM

## 2020-01-10 DIAGNOSIS — I10 Essential (primary) hypertension: Secondary | ICD-10-CM | POA: Insufficient documentation

## 2020-01-10 DIAGNOSIS — Z8673 Personal history of transient ischemic attack (TIA), and cerebral infarction without residual deficits: Secondary | ICD-10-CM | POA: Diagnosis not present

## 2020-01-10 DIAGNOSIS — C7800 Secondary malignant neoplasm of unspecified lung: Secondary | ICD-10-CM

## 2020-01-10 DIAGNOSIS — Z79899 Other long term (current) drug therapy: Secondary | ICD-10-CM | POA: Diagnosis not present

## 2020-01-10 DIAGNOSIS — Z7982 Long term (current) use of aspirin: Secondary | ICD-10-CM | POA: Diagnosis not present

## 2020-01-10 DIAGNOSIS — Z5112 Encounter for antineoplastic immunotherapy: Secondary | ICD-10-CM | POA: Insufficient documentation

## 2020-01-10 DIAGNOSIS — C3490 Malignant neoplasm of unspecified part of unspecified bronchus or lung: Secondary | ICD-10-CM | POA: Diagnosis present

## 2020-01-10 DIAGNOSIS — F329 Major depressive disorder, single episode, unspecified: Secondary | ICD-10-CM | POA: Diagnosis not present

## 2020-01-10 LAB — CBC WITH DIFFERENTIAL (CANCER CENTER ONLY)
Abs Immature Granulocytes: 0.03 10*3/uL (ref 0.00–0.07)
Basophils Absolute: 0.1 10*3/uL (ref 0.0–0.1)
Basophils Relative: 1 %
Eosinophils Absolute: 0.3 10*3/uL (ref 0.0–0.5)
Eosinophils Relative: 3 %
HCT: 42.2 % (ref 39.0–52.0)
Hemoglobin: 13.8 g/dL (ref 13.0–17.0)
Immature Granulocytes: 0 %
Lymphocytes Relative: 34 %
Lymphs Abs: 3.2 10*3/uL (ref 0.7–4.0)
MCH: 29.2 pg (ref 26.0–34.0)
MCHC: 32.7 g/dL (ref 30.0–36.0)
MCV: 89.2 fL (ref 80.0–100.0)
Monocytes Absolute: 0.7 10*3/uL (ref 0.1–1.0)
Monocytes Relative: 7 %
Neutro Abs: 5.2 10*3/uL (ref 1.7–7.7)
Neutrophils Relative %: 55 %
Platelet Count: 267 10*3/uL (ref 150–400)
RBC: 4.73 MIL/uL (ref 4.22–5.81)
RDW: 14.2 % (ref 11.5–15.5)
WBC Count: 9.5 10*3/uL (ref 4.0–10.5)
nRBC: 0 % (ref 0.0–0.2)

## 2020-01-10 LAB — CMP (CANCER CENTER ONLY)
ALT: 10 U/L (ref 0–44)
AST: 11 U/L — ABNORMAL LOW (ref 15–41)
Albumin: 3.6 g/dL (ref 3.5–5.0)
Alkaline Phosphatase: 62 U/L (ref 38–126)
Anion gap: 7 (ref 5–15)
BUN: 14 mg/dL (ref 8–23)
CO2: 27 mmol/L (ref 22–32)
Calcium: 8.7 mg/dL — ABNORMAL LOW (ref 8.9–10.3)
Chloride: 106 mmol/L (ref 98–111)
Creatinine: 0.77 mg/dL (ref 0.61–1.24)
GFR, Est AFR Am: >60 mL/min (ref >60)
GFR, Estimated: >60 mL/min (ref >60)
Glucose, Bld: 79 mg/dL (ref 70–99)
Potassium: 4.4 mmol/L (ref 3.5–5.1)
Sodium: 140 mmol/L (ref 135–145)
Total Bilirubin: 0.3 mg/dL (ref 0.3–1.2)
Total Protein: 6.8 g/dL (ref 6.5–8.1)

## 2020-01-10 LAB — TSH: TSH: 1.825 u[IU]/mL (ref 0.320–4.118)

## 2020-01-10 MED ORDER — SODIUM CHLORIDE 0.9 % IV SOLN
Freq: Once | INTRAVENOUS | Status: AC
  Filled 2020-01-10: qty 250

## 2020-01-10 MED ORDER — TEMAZEPAM 30 MG PO CAPS: ORAL_CAPSULE | ORAL | 0 refills | Status: DC

## 2020-01-10 MED ORDER — SODIUM CHLORIDE 0.9% FLUSH
10.0000 mL | INTRAVENOUS | Status: DC | PRN
  Filled 2020-01-10: qty 10

## 2020-01-10 MED ORDER — SODIUM CHLORIDE 0.9 % IV SOLN
480.0000 mg | Freq: Once | INTRAVENOUS | Status: AC
  Administered 2020-01-10: 480 mg via INTRAVENOUS
  Filled 2020-01-10: qty 48

## 2020-01-10 MED ORDER — HEPARIN SOD (PORK) LOCK FLUSH 100 UNIT/ML IV SOLN
500.0000 [IU] | Freq: Once | INTRAVENOUS | Status: DC | PRN
  Filled 2020-01-10: qty 5

## 2020-01-10 NOTE — Telephone Encounter (Signed)
Scheduled appts per 3/25 los. Gave pt a print out of AVS

## 2020-01-10 NOTE — Patient Instructions (Signed)
[facility] Discharge Instructions for Patients Receiving Chemotherapy  Today you received the following chemotherapy agents Opdivo  To help prevent nausea and vomiting after your treatment, we encourage you to take your nausea medication as directed   If you develop nausea and vomiting that is not controlled by your nausea medication, call the clinic.   BELOW ARE SYMPTOMS THAT SHOULD BE REPORTED IMMEDIATELY:  *FEVER GREATER THAN 100.5 F  *CHILLS WITH OR WITHOUT FEVER  NAUSEA AND VOMITING THAT IS NOT CONTROLLED WITH YOUR NAUSEA MEDICATION  *UNUSUAL SHORTNESS OF BREATH  *UNUSUAL BRUISING OR BLEEDING  TENDERNESS IN MOUTH AND THROAT WITH OR WITHOUT PRESENCE OF ULCERS  *URINARY PROBLEMS  *BOWEL PROBLEMS  UNUSUAL RASH Items with * indicate a potential emergency and should be followed up as soon as possible.  Feel free to call the clinic should you have any questions or concerns. The clinic phone number is [phone].  Please show the CHEMO ALERT CARD at check-in to the Emergency Department and triage nurse.   

## 2020-01-11 ENCOUNTER — Encounter: Payer: Self-pay | Admitting: Physician Assistant

## 2020-01-28 ENCOUNTER — Telehealth: Payer: Self-pay | Admitting: Medical Oncology

## 2020-01-28 ENCOUNTER — Other Ambulatory Visit: Payer: Self-pay | Admitting: Internal Medicine

## 2020-01-28 MED ORDER — AMITRIPTYLINE HCL 100 MG PO TABS: 100.0000 mg | ORAL_TABLET | Freq: Every day | ORAL | 3 refills | Status: DC

## 2020-01-28 NOTE — Telephone Encounter (Signed)
Refill Amitriptyline. appt 4/33

## 2020-02-01 NOTE — Progress Notes (Signed)
Pharmacist Chemotherapy Monitoring - Follow Up Assessment    I verify that I have reviewed each item in the below checklist:  . Regimen for the patient is scheduled for the appropriate day and plan matches scheduled date. Marland Kitchen Appropriate non-routine labs are ordered dependent on drug ordered. . If applicable, additional medications reviewed and ordered per protocol based on lifetime cumulative doses and/or treatment regimen.   Plan for follow-up and/or issues identified: No . I-vent associated with next due treatment: No . MD and/or nursing notified: No  [name] [name] 02/01/2020 9:16 AM

## 2020-02-05 ENCOUNTER — Ambulatory Visit (HOSPITAL_COMMUNITY)
Admission: RE | Admit: 2020-02-05 | Discharge: 2020-02-05 | Disposition: A | Discharge status: Home / Self Care | Payer: Medicare Other | Source: Ambulatory Visit | Attending: Physician Assistant | Admitting: Physician Assistant

## 2020-02-05 ENCOUNTER — Other Ambulatory Visit: Payer: Self-pay

## 2020-02-05 ENCOUNTER — Encounter (HOSPITAL_COMMUNITY): Payer: Self-pay

## 2020-02-05 DIAGNOSIS — I714 Abdominal aortic aneurysm, without rupture: Secondary | ICD-10-CM | POA: Diagnosis not present

## 2020-02-05 DIAGNOSIS — J439 Emphysema, unspecified: Secondary | ICD-10-CM | POA: Diagnosis not present

## 2020-02-05 DIAGNOSIS — C7800 Secondary malignant neoplasm of unspecified lung: Secondary | ICD-10-CM

## 2020-02-05 DIAGNOSIS — R911 Solitary pulmonary nodule: Secondary | ICD-10-CM | POA: Insufficient documentation

## 2020-02-05 DIAGNOSIS — I712 Thoracic aortic aneurysm, without rupture: Secondary | ICD-10-CM | POA: Diagnosis not present

## 2020-02-05 DIAGNOSIS — I700 Atherosclerosis of aorta: Secondary | ICD-10-CM | POA: Insufficient documentation

## 2020-02-05 MED ORDER — SODIUM CHLORIDE (PF) 0.9 % IJ SOLN
INTRAMUSCULAR | Status: AC
  Filled 2020-02-05: qty 50

## 2020-02-05 MED ORDER — IOHEXOL 300 MG/ML  SOLN
100.0000 mL | Freq: Once | INTRAMUSCULAR | Status: AC | PRN
  Administered 2020-02-05: 100 mL via INTRAVENOUS

## 2020-02-05 NOTE — Progress Notes (Signed)
[facility] OFFICE PROGRESS NOTE  System, Pcp Not In No address on file  DIAGNOSIS: Stage IV (T3, N0, M1 a) non-small cell lung cancer, squamous cell carcinoma presented with multiple bilateral pulmonary nodules diagnosed in April 2018.  PRIOR THERAPY: Systemic chemotherapy with carboplatin for AUC of 5 and paclitaxel 175 MG/M2 every 3 weeks with Neulasta support. Status post 6 cycles.  CURRENT THERAPY: Second line treatment with immunotherapy was Nivolumab 480 mg IV every 4 weeks. First cycle November 14, 2017. Status post25cycles  INTERVAL HISTORY: [name] [name] 69 y.o. male returns to the clinic for a follow up visit. The patient is feeling well today without any concerning complaints. The patient continues to tolerate treatment with fairly well without any adverse side effects. Denies any fever, chills, night sweats, or weight loss. He reports his baseline shortness of breath with exertion. He denies any significant or unusual cough.Denies any nausea, vomiting, or constipation. He has baseline diarrhea for which he takes imodium. He states he has about 1-2 loose stools daily. He states he had two episodes of blood in the toilet bowel a few days ago. States he has a history of hemorrhoids. Denies any abdominal pain. Denies any headache or visual changes. Denies any rashes or skin changes. He recently had a restaging CT scan performed. The patient is here today for evaluation and to review his scan prior to starting cycle # 26    MEDICAL HISTORY: Past Medical History:  Diagnosis Date  . Depression 01/29/2017  . Dyspnea   . Encounter for antineoplastic chemotherapy 01/29/2017  . Goals of care, counseling/discussion 01/29/2017  . Hypertension   . Metastatic squamous cell carcinoma to lung, unspecified laterality ([place]) 01/28/2017  . Stroke [facility])     ALLERGIES:  has No Known Allergies.  MEDICATIONS:  Current Outpatient Medications  Medication Sig Dispense Refill  .  acetaminophen (TYLENOL) 325 MG tablet You can take 2 every 4 hours as needed.  You can buy it over the counter DO NOT TAKE MORE THAN 4000 MG OF TYLENOL PER DAY.  IT CAN HARM YOUR LIVER.    Marland Kitchen amitriptyline (ELAVIL) 100 MG tablet Take 1 tablet (100 mg total) by mouth at bedtime. 30 tablet 3  . aspirin EC 81 MG tablet Take 81 mg by mouth daily.    Marland Kitchen gabapentin (NEURONTIN) 300 MG capsule Take 2 capsules (600 mg total) by mouth 3 (three) times daily. 90 capsule 3  . loperamide (IMODIUM) 2 MG capsule Take 1 capsule (2 mg total) by mouth 2 (two) times daily. Also available over the counter. You can take 1 extra dose in the afternoon if diarrhea is not improving. 60 capsule 4  . mesalamine (APRISO) 0.375 g 24 hr capsule Take 4 capsules by mouth daily.    . metoprolol succinate (TOPROL-XL) 25 MG 24 hr tablet Take 1 tablet (25 mg total) by mouth daily. 30 tablet 4  . mirtazapine (REMERON) 30 MG tablet TAKE 1 TABLET BY MOUTH AT BEDTIME 30 tablet 0  . nortriptyline (PAMELOR) 10 MG capsule Take 10 mg by mouth 2 (two) times daily.     . pantoprazole (PROTONIX) 40 MG tablet Take 1 tablet (40 mg total) by mouth 2 (two) times daily. 60 tablet 1  . simvastatin (ZOCOR) 20 MG tablet Take 20 mg by mouth daily.    . temazepam (RESTORIL) 30 MG capsule TAKE 1 CAPSULE BY MOUTH AT BEDTIME AS NEEDED FOR SLEEP 30 capsule 0   No current facility-administered medications for  this visit.   Facility-Administered Medications Ordered in Other Visits  Medication Dose Route Frequency Provider Last Rate Last Admin  . sodium chloride (PF) 0.9 % injection             SURGICAL HISTORY:  Past Surgical History:  Procedure Laterality Date  . BACK SURGERY     cyst removal  . COLONOSCOPY    . LAPAROTOMY N/A 02/25/2017   Procedure: EXPLORATORY LAPAROTOMY MODIFIED GRAHAM'S PATCH;  Surgeon: Kinsinger, Arta Bruce, MD;  Location: [facility];  Service: General;  Laterality: N/A;  . MINOR PLACEMENT OF FIDUCIAL N/A 01/19/2017   Procedure: MINOR  PLACEMENT OF FIDUCIAL x 6;  Surgeon: Collene Gobble, MD;  Location: [facility];  Service: Thoracic;  Laterality: N/A;  . VIDEO BRONCHOSCOPY WITH ENDOBRONCHIAL NAVIGATION N/A 01/19/2017   Procedure: VIDEO BRONCHOSCOPY WITH ENDOBRONCHIAL NAVIGATION;  Surgeon: Collene Gobble, MD;  Location: [facility];  Service: Thoracic;  Laterality: N/A;    REVIEW OF SYSTEMS:   Review of Systems  Constitutional: Negative for appetite change, chills, fatigue, fever and unexpected weight change.  HENT: Negative for mouth sores, nosebleeds, sore throat and trouble swallowing.   Eyes: Negative for eye problems and icterus.  Respiratory: Positive for baseline dyspnea on exertion. Negative for cough, hemoptysis, and wheezing.  Cardiovascular: Negative for leg swelling or chest pain.  Gastrointestinal: Positive for baseline diarrhea. Positive for two episodes of blood in toilet bowl after bowel movement. Negative for abdominal pain, constipation, nausea and vomiting.  Genitourinary: Negative for bladder incontinence, difficulty urinating, dysuria, frequency and hematuria.   Musculoskeletal: Negative for gait problem, neck pain and neck stiffness.  Skin: Negative for itching and rash.  Neurological: Negative for dizziness, extremity weakness, gait problem, headaches, light-headedness and seizures.  Hematological: Negative for adenopathy. Does not bruise/bleed easily.  Psychiatric/Behavioral: Negative for confusion, depression and sleep disturbance. The patient is not nervous/anxious.     PHYSICAL EXAMINATION:  There were no vitals taken for this visit.  ECOG PERFORMANCE STATUS: 1 - Symptomatic but completely ambulatory  Physical Exam  Constitutional: Oriented to person, place, and time and well-developed, well-nourished, and in no distress.  HENT:  Head: Normocephalic and atraumatic.  Mouth/Throat: Oropharynx is clear and moist. No oropharyngeal exudate.  Eyes: Conjunctivae are normal. Right eye exhibits no discharge. Left  eye exhibits no discharge. No scleral icterus.  Neck: Normal range of motion. Neck supple.  Cardiovascular: Normal rate, regular rhythm, normal heart sounds and intact distal pulses.   Pulmonary/Chest: Effort normal and breath sounds normal. No respiratory distress. No wheezes. No rales.  Abdominal: Soft. Bowel sounds are normal. Exhibits no distension and no mass. There is no tenderness.  Musculoskeletal: Normal range of motion. Exhibits no edema.  Lymphadenopathy:    No cervical adenopathy.  Neurological: Alert and oriented to person, place, and time. Exhibits normal muscle tone. Gait normal. Coordination normal.  Skin: Skin is warm and dry. No rash noted. Not diaphoretic. No erythema. No pallor.  Psychiatric: Mood, memory and judgment normal.  Vitals reviewed.  LABORATORY DATA: Lab Results  Component Value Date   WBC 9.5 01/10/2020   HGB 13.8 01/10/2020   HCT 42.2 01/10/2020   MCV 89.2 01/10/2020   PLT 267 01/10/2020      Chemistry      Component Value Date/Time   NA 140 01/10/2020 1155   NA 139 10/06/2017 1052   K 4.4 01/10/2020 1155   K 4.2 10/06/2017 1052   CL 106 01/10/2020 1155   CO2 27 01/10/2020 1155  CO2 25 10/06/2017 1052   BUN 14 01/10/2020 1155   BUN 13.2 10/06/2017 1052   CREATININE 0.77 01/10/2020 1155   CREATININE 0.9 10/06/2017 1052      Component Value Date/Time   CALCIUM 8.7 (L) 01/10/2020 1155   CALCIUM 9.5 10/06/2017 1052   ALKPHOS 62 01/10/2020 1155   ALKPHOS 68 10/06/2017 1052   AST 11 (L) 01/10/2020 1155   AST 14 10/06/2017 1052   ALT 10 01/10/2020 1155   ALT 13 10/06/2017 1052   BILITOT 0.3 01/10/2020 1155   BILITOT 0.44 10/06/2017 1052       RADIOGRAPHIC STUDIES:  CT Chest W Contrast  Result Date: 02/05/2020 CLINICAL DATA:  Restaging lung cancer. EXAM: CT CHEST, ABDOMEN, AND PELVIS WITH CONTRAST TECHNIQUE: Multidetector CT imaging of the chest, abdomen and pelvis was performed following the standard protocol during bolus  administration of intravenous contrast. CONTRAST:  146mL OMNIPAQUE IOHEXOL 300 MG/ML  SOLN COMPARISON:  10/08/2019 FINDINGS: CT CHEST FINDINGS Cardiovascular: Heart size is normal. Mild ectasia of the ascending thoracic aorta measures 4 cm and is unchanged from previous exam. No pericardial effusion. Aortic atherosclerosis. RCA coronary artery calcifications. Mediastinum/Nodes: Normal appearance of the thyroid gland. The trachea appears patent and is midline. Normal appearance of the esophagus. No enlarged mediastinal or hilar lymph nodes. Lungs/Pleura: No pleural effusion identified. Advanced changes of centrilobular and paraseptal emphysema. Bilateral pulmonary nodules are again noted including: right upper lobe lung nodule measures 0.8 cm, image 39/7. Previously 0.9 cm 5 mm posterior right upper lobe lung nodule is unchanged, image 56/7. There are 2 fiducial markers in the region of the previous posterolateral left upper lobe lung lesion. Progressive changes of fibrosis and architectural distortion identified in this area, image 65/7. These findings likely reflect changes secondary to external beam radiation. The underlying lung lesion is no longer confidently measurable separate from changes of external beam radiation. Lateral left upper lobe lung nodule measures 3 mm, image 57/7. Previously 0.6 cm. New indeterminate subpleural nodule in the central left lung base measures 0.9 cm,, image 113/7 Musculoskeletal: Degenerative changes are again noted within the thoracic spine. Chronic left posterior rib deformity is stable from previous study. No aggressive lytic or sclerotic bone lesions identified. CT ABDOMEN PELVIS FINDINGS Hepatobiliary: No focal liver abnormality is seen. No gallstones, gallbladder wall thickening, or biliary dilatation. Pancreas: Unremarkable. No pancreatic ductal dilatation or surrounding inflammatory changes. Spleen: Normal in size without focal abnormality. Adrenals/Urinary Tract: Unchanged  low-attenuation enlargement of the adrenal glands likely reflecting underlying adenomas. No kidney mass or hydronephrosis. Urinary bladder unremarkable. Stomach/Bowel: Stomach is within normal limits. Appendix appears normal. No evidence of bowel wall thickening, distention, or inflammatory changes. Vascular/Lymphatic: Aortic atherosclerosis. Infrarenal abdominal aortic aneurysm measures 3.3 cm, image 81/3. Unchanged from previous exam. No abdominopelvic adenopathy Reproductive: Prostate gland appears enlarged. Other: Fat containing right inguinal hernia. No ascites or focal fluid collections. Musculoskeletal: No acute or significant osseous findings. IMPRESSION: 1. Bilateral pulmonary nodules are again noted. These are stable to decreased in the interval. Interval response to therapy. 2. Progressive changes of external beam radiation are identified within the posterolateral left upper lobe in the distribution of previously noted lung nodule which is no longer measurable separate from these changes. 3. There is a new nonspecific subpleural nodule in the central left lung base measuring 0.9 cm. Attention on follow-up imaging is advised. 4. Ascending thoracic aortic aneurysm and infrarenal abdominal aortic aneurysm are unchanged. In a patient on known cancer surveillance these are amenable to attention  on follow-up. Aortic aneurysm NOS (ICD10-I71.9). 5. Emphysema and aortic atherosclerosis. Aortic Atherosclerosis (ICD10-I70.0) and Emphysema (ICD10-J43.9). Electronically Signed   By: Kerby Moors M.D.   On: 02/05/2020 17:01   CT Abdomen Pelvis W Contrast  Result Date: 02/05/2020 CLINICAL DATA:  Restaging lung cancer. EXAM: CT CHEST, ABDOMEN, AND PELVIS WITH CONTRAST TECHNIQUE: Multidetector CT imaging of the chest, abdomen and pelvis was performed following the standard protocol during bolus administration of intravenous contrast. CONTRAST:  156mL OMNIPAQUE IOHEXOL 300 MG/ML  SOLN COMPARISON:  10/08/2019  FINDINGS: CT CHEST FINDINGS Cardiovascular: Heart size is normal. Mild ectasia of the ascending thoracic aorta measures 4 cm and is unchanged from previous exam. No pericardial effusion. Aortic atherosclerosis. RCA coronary artery calcifications. Mediastinum/Nodes: Normal appearance of the thyroid gland. The trachea appears patent and is midline. Normal appearance of the esophagus. No enlarged mediastinal or hilar lymph nodes. Lungs/Pleura: No pleural effusion identified. Advanced changes of centrilobular and paraseptal emphysema. Bilateral pulmonary nodules are again noted including: right upper lobe lung nodule measures 0.8 cm, image 39/7. Previously 0.9 cm 5 mm posterior right upper lobe lung nodule is unchanged, image 56/7. There are 2 fiducial markers in the region of the previous posterolateral left upper lobe lung lesion. Progressive changes of fibrosis and architectural distortion identified in this area, image 65/7. These findings likely reflect changes secondary to external beam radiation. The underlying lung lesion is no longer confidently measurable separate from changes of external beam radiation. Lateral left upper lobe lung nodule measures 3 mm, image 57/7. Previously 0.6 cm. New indeterminate subpleural nodule in the central left lung base measures 0.9 cm,, image 113/7 Musculoskeletal: Degenerative changes are again noted within the thoracic spine. Chronic left posterior rib deformity is stable from previous study. No aggressive lytic or sclerotic bone lesions identified. CT ABDOMEN PELVIS FINDINGS Hepatobiliary: No focal liver abnormality is seen. No gallstones, gallbladder wall thickening, or biliary dilatation. Pancreas: Unremarkable. No pancreatic ductal dilatation or surrounding inflammatory changes. Spleen: Normal in size without focal abnormality. Adrenals/Urinary Tract: Unchanged low-attenuation enlargement of the adrenal glands likely reflecting underlying adenomas. No kidney mass or  hydronephrosis. Urinary bladder unremarkable. Stomach/Bowel: Stomach is within normal limits. Appendix appears normal. No evidence of bowel wall thickening, distention, or inflammatory changes. Vascular/Lymphatic: Aortic atherosclerosis. Infrarenal abdominal aortic aneurysm measures 3.3 cm, image 81/3. Unchanged from previous exam. No abdominopelvic adenopathy Reproductive: Prostate gland appears enlarged. Other: Fat containing right inguinal hernia. No ascites or focal fluid collections. Musculoskeletal: No acute or significant osseous findings. IMPRESSION: 1. Bilateral pulmonary nodules are again noted. These are stable to decreased in the interval. Interval response to therapy. 2. Progressive changes of external beam radiation are identified within the posterolateral left upper lobe in the distribution of previously noted lung nodule which is no longer measurable separate from these changes. 3. There is a new nonspecific subpleural nodule in the central left lung base measuring 0.9 cm. Attention on follow-up imaging is advised. 4. Ascending thoracic aortic aneurysm and infrarenal abdominal aortic aneurysm are unchanged. In a patient on known cancer surveillance these are amenable to attention on follow-up. Aortic aneurysm NOS (ICD10-I71.9). 5. Emphysema and aortic atherosclerosis. Aortic Atherosclerosis (ICD10-I70.0) and Emphysema (ICD10-J43.9). Electronically Signed   By: Kerby Moors M.D.   On: 02/05/2020 17:01     ASSESSMENT/PLAN:  This is a very pleasant 69 year old Caucasian male with stage IV non-small cell lung cancer, squamous cell carcinoma. He presented with multiple bilateral pulmonary nodules. He was diagnosed in April 2018.  He underwent systemic chemotherapy  with carboplatin and paclitaxel. He is status post 6 cycles. He tolerated it well except for the development of persistent peripheral neuropathy. He was then on observation for 3 months. He showed signs of disease  progression.  He is currently being treated with immunotherapy with nivolumab 480 mg IV every 4 weeks. He is status post25cycles.  The patient recently had a restaging CT scan performed. Dr. Julien Nordmann personally and independently reviewed the scan and discussed the results with the patient. The scan did not show any evidence of disease progression. There is a new nonspecific 0.9 cm nodule which we will continue to monitor on routine imaging. Recommend that he proceed with cycle #26 today as scheduled.   We will see him back for a follow up visit in 4 weeks for evaluation before starting cycle #27.   The patient will continue taking gabapentin and amitriptyline for his peripheral neuropathy.  The patient has a history of hemorrhoids and denies anymore bleeding at this time. No significant change in his Hbg. Advised the patient to seek medical evaluation if he develops significant or more rectal bleeding. No inflammation/ bowel changes noted in the abdomen/pelvis on recent imaging.    The patient was advised to call immediately if he has any concerning symptoms in the interval. The patient voices understanding of current disease status and treatment options and is in agreement with the current care plan. All questions were answered. The patient knows to call the clinic with any problems, questions or concerns. We can certainly see the patient much sooner if necessary  No orders of the defined types were placed in this encounter.    Letita Prentiss L Natash Berman, PA-C 02/05/20  ADDENDUM: Hematology/Oncology Attending: I had a face-to-face encounter with the patient.  I recommended his care plan.  This is a very pleasant 69 years old white male with a stage IV non-small cell lung cancer, squamous cell carcinoma status post induction treatment with carboplatin and paclitaxel for 6 cycles.  This was complicated with peripheral neuropathy.  The patient had evidence for disease progression and he  started on second line treatment with nivolumab 480 mg IV every 4 weeks status post 25 cycles.  He has been tolerating this treatment well with no concerning adverse effects.  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 The patient is here today for evaluation with repeat CT scan of the chest, abdomen pelvis for restaging of his disease. I personally and independently reviewed the scans and discussed the results with the patient today. His scan showed no concerning findings for disease progression or metastasis except a nonspecific 0.9 cm nodule in the central left lung base.  We will continue to monitor this nodule closely on the upcoming imaging studies. I recommended for the patient to continue his current treatment with nivolumab and he will proceed with cycle #26 today. The patient will come back for follow-up visit in 4 weeks for evaluation before the next cycle of his treatment.  He will continue his current treatment with gabapentin and amitriptyline for the peripheral neuropathy. The patient was advised to call immediately if he has any concerning symptoms in the interval.  Disclaimer: This note was dictated with voice recognition software. Similar sounding words can inadvertently be transcribed and may be missed upon review. Eilleen Kempf, MD 02/08/20

## 2020-02-07 ENCOUNTER — Inpatient Hospital Stay (HOSPITAL_BASED_OUTPATIENT_CLINIC_OR_DEPARTMENT_OTHER): Payer: Medicare Other | Admitting: Physician Assistant

## 2020-02-07 ENCOUNTER — Other Ambulatory Visit: Payer: Self-pay | Admitting: Medical Oncology

## 2020-02-07 ENCOUNTER — Other Ambulatory Visit: Payer: Self-pay

## 2020-02-07 ENCOUNTER — Inpatient Hospital Stay: Payer: Medicare Other | Attending: Internal Medicine

## 2020-02-07 ENCOUNTER — Encounter: Payer: Self-pay | Admitting: Licensed Clinical Social Worker

## 2020-02-07 ENCOUNTER — Other Ambulatory Visit: Payer: Self-pay | Admitting: Physician Assistant

## 2020-02-07 ENCOUNTER — Inpatient Hospital Stay: Payer: Medicare Other

## 2020-02-07 VITALS — BP 135/89 | HR 76 | Temp 99.1°F | Resp 18 | Ht 68.0 in | Wt 181.4 lb

## 2020-02-07 DIAGNOSIS — C7800 Secondary malignant neoplasm of unspecified lung: Secondary | ICD-10-CM

## 2020-02-07 DIAGNOSIS — Z79899 Other long term (current) drug therapy: Secondary | ICD-10-CM | POA: Insufficient documentation

## 2020-02-07 DIAGNOSIS — R5382 Chronic fatigue, unspecified: Secondary | ICD-10-CM

## 2020-02-07 DIAGNOSIS — Z5112 Encounter for antineoplastic immunotherapy: Secondary | ICD-10-CM

## 2020-02-07 DIAGNOSIS — C3490 Malignant neoplasm of unspecified part of unspecified bronchus or lung: Secondary | ICD-10-CM

## 2020-02-07 DIAGNOSIS — I10 Essential (primary) hypertension: Secondary | ICD-10-CM | POA: Insufficient documentation

## 2020-02-07 DIAGNOSIS — G629 Polyneuropathy, unspecified: Secondary | ICD-10-CM | POA: Diagnosis not present

## 2020-02-07 DIAGNOSIS — G4700 Insomnia, unspecified: Secondary | ICD-10-CM

## 2020-02-07 LAB — CBC WITH DIFFERENTIAL (CANCER CENTER ONLY)
Abs Immature Granulocytes: 0.01 10*3/uL (ref 0.00–0.07)
Basophils Absolute: 0.1 10*3/uL (ref 0.0–0.1)
Basophils Relative: 1 %
Eosinophils Absolute: 0.2 10*3/uL (ref 0.0–0.5)
Eosinophils Relative: 2 %
HCT: 41.4 % (ref 39.0–52.0)
Hemoglobin: 13.6 g/dL (ref 13.0–17.0)
Immature Granulocytes: 0 %
Lymphocytes Relative: 46 %
Lymphs Abs: 3.8 10*3/uL (ref 0.7–4.0)
MCH: 29.2 pg (ref 26.0–34.0)
MCHC: 32.9 g/dL (ref 30.0–36.0)
MCV: 89 fL (ref 80.0–100.0)
Monocytes Absolute: 0.6 10*3/uL (ref 0.1–1.0)
Monocytes Relative: 7 %
Neutro Abs: 3.6 10*3/uL (ref 1.7–7.7)
Neutrophils Relative %: 44 %
Platelet Count: 247 10*3/uL (ref 150–400)
RBC: 4.65 MIL/uL (ref 4.22–5.81)
RDW: 14.8 % (ref 11.5–15.5)
WBC Count: 8.2 10*3/uL (ref 4.0–10.5)
nRBC: 0 % (ref 0.0–0.2)

## 2020-02-07 LAB — CMP (CANCER CENTER ONLY)
ALT: 9 U/L (ref 0–44)
AST: 13 U/L — ABNORMAL LOW (ref 15–41)
Albumin: 3.9 g/dL (ref 3.5–5.0)
Alkaline Phosphatase: 67 U/L (ref 38–126)
Anion gap: 7 (ref 5–15)
BUN: 13 mg/dL (ref 8–23)
CO2: 26 mmol/L (ref 22–32)
Calcium: 9 mg/dL (ref 8.9–10.3)
Chloride: 104 mmol/L (ref 98–111)
Creatinine: 0.97 mg/dL (ref 0.61–1.24)
GFR, Est AFR Am: >60 mL/min (ref >60)
GFR, Estimated: >60 mL/min (ref >60)
Glucose, Bld: 99 mg/dL (ref 70–99)
Potassium: 4.1 mmol/L (ref 3.5–5.1)
Sodium: 137 mmol/L (ref 135–145)
Total Bilirubin: 0.6 mg/dL (ref 0.3–1.2)
Total Protein: 7.1 g/dL (ref 6.5–8.1)

## 2020-02-07 LAB — TSH: TSH: 1.822 u[IU]/mL (ref 0.320–4.118)

## 2020-02-07 MED ORDER — SODIUM CHLORIDE 0.9 % IV SOLN
Freq: Once | INTRAVENOUS | Status: AC
  Filled 2020-02-07: qty 250

## 2020-02-07 MED ORDER — SODIUM CHLORIDE 0.9 % IV SOLN
480.0000 mg | Freq: Once | INTRAVENOUS | Status: AC
  Administered 2020-02-07: 16:00:00 480 mg via INTRAVENOUS
  Filled 2020-02-07: qty 48

## 2020-02-07 NOTE — Progress Notes (Signed)
[facility] CSW Progress Note  Holiday representative met with patient in infusion to sign up for [employer] and provide first Quarry manager. Patient is also financially stressed due to medical bills and would like any other assistance available.     Edwinna Areola Eveny Anastas , LCSW

## 2020-02-07 NOTE — Telephone Encounter (Signed)
Pt aware that refill was sent.

## 2020-02-07 NOTE — Patient Instructions (Signed)
[facility] Discharge Instructions for Patients Receiving Chemotherapy  Today you received the following chemotherapy agents Opdivo  To help prevent nausea and vomiting after your treatment, we encourage you to take your nausea medication as directed   If you develop nausea and vomiting that is not controlled by your nausea medication, call the clinic.   BELOW ARE SYMPTOMS THAT SHOULD BE REPORTED IMMEDIATELY:  *FEVER GREATER THAN 100.5 F  *CHILLS WITH OR WITHOUT FEVER  NAUSEA AND VOMITING THAT IS NOT CONTROLLED WITH YOUR NAUSEA MEDICATION  *UNUSUAL SHORTNESS OF BREATH  *UNUSUAL BRUISING OR BLEEDING  TENDERNESS IN MOUTH AND THROAT WITH OR WITHOUT PRESENCE OF ULCERS  *URINARY PROBLEMS  *BOWEL PROBLEMS  UNUSUAL RASH Items with * indicate a potential emergency and should be followed up as soon as possible.  Feel free to call the clinic should you have any questions or concerns. The clinic phone number is [phone].  Please show the CHEMO ALERT CARD at check-in to the Emergency Department and triage nurse.   

## 2020-02-08 ENCOUNTER — Telehealth: Payer: Self-pay | Admitting: General Practice

## 2020-02-08 ENCOUNTER — Encounter: Payer: Self-pay | Admitting: Physician Assistant

## 2020-02-08 NOTE — Telephone Encounter (Signed)
[facility] CSW Progress Notes  Patient requested call back this afternoon to discuss any options for additional financial support.  Tried to call patient, no answer, no VM, unable to leave message.  Spoke w L White, Estate manager/land agent - she reviewed his billing and confirmed that he does have a balance due for Jan chemotherapy.  When able to reach patient, he can be referred to [facility] for consideration for their financial assistance program.  They do have a program that can assist w some living expenses.  He can also be given additional [employer] cards at his upcoming appointments.  Edwyna Shell, LCSW Clinical Social Worker Phone:  [phone] Cell:  [phone]

## 2020-02-08 NOTE — Telephone Encounter (Signed)
[facility] CSW Progress Notes  CSW following up on request from [place] who met w patient yesterday and reported he has financial distress.  Called patient, he reports his distress stems from unexpectedly high medical bills related to his chemotherapy.  He has spoken w his Financial Advocate, Stefanie Libel, who has told him bills he is receiving are accurate representation of his portion of treatment expenses under the terms of his insurance policy.  CSW messages [employer] and asked that she speak w patient on her return to work next week.  Can also refer patient to [facility] [place] for any available help as well as to Triage Cancer for further understanding of his insurance coverage.  He has already spoken to his carrier.  Can provide [employer] cards three more times, will schedule to see him at next infusion.  He requests call back at later time w contact information for [facility] [place] and Triage Cancer.  Edwyna Shell, LCSW Clinical Social Worker Phone:  [phone] Cell:  [phone]

## 2020-02-12 ENCOUNTER — Encounter: Payer: Self-pay | Admitting: *Deleted

## 2020-02-12 NOTE — Progress Notes (Signed)
[place] Work  Clinical Social Work attempted to contact patient at home to follow up on financial concerns.  No answer and no voicemail, so CSW was unable to leave a message.  Unfortunately [facility] [place] and [facility] are currently out of assistance funds.  CSW will continue to support patient with the [place].  Johnnye Lana, MSW, LCSW, OSW-C Clinical Social Worker [facility] [phone]

## 2020-02-13 ENCOUNTER — Telehealth: Payer: Self-pay | Admitting: General Practice

## 2020-02-13 ENCOUNTER — Telehealth: Payer: Self-pay | Admitting: Medical Oncology

## 2020-02-13 NOTE — Telephone Encounter (Signed)
Returned call/ Forwarded to [employer]

## 2020-02-13 NOTE — Telephone Encounter (Signed)
[place] CSW Progress Notes  Call from patient, returning CSW call.  He continues to be concerned about bills he is receiving for chemotherapy treatments.  He does not understand why he is receiving bills, as he has not been in the past.  CSW suggested he contact his insurance carrier and [facility] billing to get further understanding of his coverage and patient responsibility.  Also referred him to [facility] [place] for any assistance they can provide for copay relief or case management.  He can receive 3 additional [employer], there are no other options for transportation assistance that CSW is aware of.  Edwyna Shell, LCSW Clinical Social Worker Phone:  [phone] Cell:  [phone]

## 2020-02-29 ENCOUNTER — Telehealth: Payer: Self-pay | Admitting: Medical Oncology

## 2020-02-29 NOTE — Telephone Encounter (Signed)
Insurance lapsed until June 1 . Cancel appts for May 20 . Schedule message sent.

## 2020-02-29 NOTE — Progress Notes (Signed)
Pharmacist Chemotherapy Monitoring - Follow Up Assessment    I verify that I have reviewed each item in the below checklist:  . Regimen for the patient is scheduled for the appropriate day and plan matches scheduled date. Marland Kitchen Appropriate non-routine labs are ordered dependent on drug ordered. . If applicable, additional medications reviewed and ordered per protocol based on lifetime cumulative doses and/or treatment regimen.   Plan for follow-up and/or issues identified: No . I-vent associated with next due treatment: No . MD and/or nursing notified: No  Romualdo Bolk [facility] 02/29/2020 8:13 AM

## 2020-03-04 ENCOUNTER — Telehealth: Payer: Self-pay | Admitting: Internal Medicine

## 2020-03-04 NOTE — Telephone Encounter (Signed)
Scheduled per 5/17 sch message. Called pt and pt cancelled appts on 6/1 due to insurance. Sent message to RN.

## 2020-03-06 ENCOUNTER — Inpatient Hospital Stay: Payer: Medicare Other

## 2020-03-06 ENCOUNTER — Inpatient Hospital Stay: Payer: Medicare Other | Admitting: Physician Assistant

## 2020-03-07 ENCOUNTER — Telehealth: Payer: Self-pay

## 2020-03-07 NOTE — Telephone Encounter (Signed)
Patient called inquiring about when his next appointment will be.  Informed patient he is scheduled to come in on 04/03/20 at 1:30pm.  Patient verbalized understanding. All questions were answered during phone call.

## 2020-03-10 ENCOUNTER — Other Ambulatory Visit: Payer: Self-pay | Admitting: Internal Medicine

## 2020-03-10 ENCOUNTER — Other Ambulatory Visit: Payer: Self-pay | Admitting: Physician Assistant

## 2020-03-10 ENCOUNTER — Telehealth: Payer: Self-pay | Admitting: Medical Oncology

## 2020-03-10 DIAGNOSIS — C7800 Secondary malignant neoplasm of unspecified lung: Secondary | ICD-10-CM

## 2020-03-10 DIAGNOSIS — G4700 Insomnia, unspecified: Secondary | ICD-10-CM

## 2020-03-10 NOTE — Telephone Encounter (Signed)
Temazepam refill requested. Does not have any left. " can I get one refill please?"

## 2020-03-10 NOTE — Telephone Encounter (Signed)
He keeps missing his appointment and then ask for refill.  I will do at this time but I would not be able to do it in the future unless he keeps his appointments.

## 2020-03-11 ENCOUNTER — Other Ambulatory Visit: Payer: Self-pay | Admitting: Physician Assistant

## 2020-03-11 ENCOUNTER — Telehealth: Payer: Self-pay | Admitting: Physician Assistant

## 2020-03-11 DIAGNOSIS — C7800 Secondary malignant neoplasm of unspecified lung: Secondary | ICD-10-CM

## 2020-03-11 DIAGNOSIS — G4700 Insomnia, unspecified: Secondary | ICD-10-CM

## 2020-03-11 MED ORDER — TEMAZEPAM 30 MG PO CAPS: 30.0000 mg | ORAL_CAPSULE | Freq: Every day | ORAL | 0 refills | Status: DC

## 2020-03-11 NOTE — Telephone Encounter (Signed)
I tried to call the patient to discuss rescheduling his appointments. I was unable to reach the patient and unable to leave messages on both of the numbers I called. I had received messages that this patient will have new insurance starting 6/1. His treatment was scheduled for 6/1 but supposedly the patient cancelled it. I wanted to touch base with the patient directly to see when his appointments can be rescheduled. His next scheduled appointment is 6/17 which is too many weeks to go without treatment. Would like to reschedule for first availability. Pending a call back. Will try again later.

## 2020-03-18 ENCOUNTER — Ambulatory Visit: Payer: Medicare Other | Admitting: Physician Assistant

## 2020-03-18 ENCOUNTER — Other Ambulatory Visit: Payer: Medicare Other

## 2020-03-18 ENCOUNTER — Ambulatory Visit: Payer: Medicare Other

## 2020-03-20 ENCOUNTER — Telehealth: Payer: Self-pay | Admitting: Medical Oncology

## 2020-03-20 NOTE — Telephone Encounter (Signed)
He broke a tooth and is trying to get all hi appts in one day . He confirmed his appt on June 17th. He also  said if he can come earlier he will call us back.

## 2020-03-20 NOTE — Telephone Encounter (Signed)
Pt asked if June 7th was available for appts. I called and lvm that it is not and to keep appts on June 17.

## 2020-03-21 ENCOUNTER — Other Ambulatory Visit: Payer: Self-pay | Admitting: Medical Oncology

## 2020-03-21 ENCOUNTER — Telehealth: Payer: Self-pay | Admitting: Medical Oncology

## 2020-03-21 DIAGNOSIS — C7931 Secondary malignant neoplasm of brain: Secondary | ICD-10-CM

## 2020-03-21 DIAGNOSIS — C7800 Secondary malignant neoplasm of unspecified lung: Secondary | ICD-10-CM

## 2020-03-21 DIAGNOSIS — Z5111 Encounter for antineoplastic chemotherapy: Secondary | ICD-10-CM

## 2020-03-21 MED ORDER — GABAPENTIN 300 MG PO CAPS: 600.0000 mg | ORAL_CAPSULE | Freq: Three times a day (TID) | ORAL | 3 refills | Status: DC

## 2020-03-21 NOTE — Addendum Note (Signed)
Addended by: Ardeen Garland on: 03/21/2020 04:30 PM   Modules accepted: Orders

## 2020-03-21 NOTE — Telephone Encounter (Addendum)
3 refills requested-Gabapentin, temazepam and amitriptyline. appt 6/17.  I told Velva Harman : Gabapentin - Dr Mickeal Skinner refilled -jan #90 Temazepam - Dr Julien Nordmann will not refill now. He has missed 6 appts. and he can discuss refill with Julien Nordmann on June 17th. Amitriptyline - call his pharmacy-he has  2 refills at pharmacy.

## 2020-03-26 ENCOUNTER — Telehealth: Payer: Self-pay | Admitting: Medical Oncology

## 2020-03-26 ENCOUNTER — Other Ambulatory Visit: Payer: Self-pay | Admitting: Physician Assistant

## 2020-03-26 DIAGNOSIS — C7800 Secondary malignant neoplasm of unspecified lung: Secondary | ICD-10-CM

## 2020-03-26 DIAGNOSIS — G4700 Insomnia, unspecified: Secondary | ICD-10-CM

## 2020-03-26 NOTE — Telephone Encounter (Signed)
LVM that [name] [name] will not refill temazepam due to missed appts and to ask his PCP  for refill.

## 2020-04-02 NOTE — Progress Notes (Addendum)
[name] [name]  System, Pcp Not In No address on file  DIAGNOSIS: Stage IV (T3, N0, M1 a) non-small cell lung cancer, squamous cell carcinoma presented with multiple bilateral pulmonary nodules diagnosed in April 2018.  PRIOR THERAPY: Systemic chemotherapy with carboplatin for AUC of 5 and paclitaxel 175 MG/M2 every 3 weeks with Neulasta support. Status post 6 cycles.  CURRENT THERAPY: Second line treatment with immunotherapy was Nivolumab 480 mg IV every 4 weeks. First cycle November 14, 2017. Status post26cycles  INTERVAL HISTORY: [name] [name] 69 y.o. male returns to the clinic today for a follow up visit. His most recent cycle of treatment was on 02/07/2020. He cancelled his appointment in May. He was called and we discussed with him that we will need to reschedule his appointment, rather than waiting another month until his next scheduled treatment.  We have discussed with the patient multiple times that skipping appointments may result in disease progression.   Today, the patient is feeling fairly well without any concerning complaints.  The patient has insomnia for which he is prescribed Restoril. He denies any recent fever, chills, night sweats, or weight loss.  He reports his baseline shortness of breath with exertion.  He denies any significant or unusual coughs.  He denies any nausea, vomiting, or constipation.  He endorses his baseline diarrhea for which he takes Imodium.  He has approximately 1-2 loose stools daily.  He denies any recent melena. He denies any headache or visual changes.  He denies any rashes or skin changes.  The patient is here today for evaluation before starting cycle #27.  MEDICAL HISTORY: Past Medical History:  Diagnosis Date  . Depression 01/29/2017  . Dyspnea   . Encounter for antineoplastic chemotherapy 01/29/2017  . Goals of care, counseling/discussion 01/29/2017  . Hypertension   . Metastatic squamous cell carcinoma to  lung, unspecified laterality ([place]) 01/28/2017  . Stroke [facility])     ALLERGIES:  has No Known Allergies.  MEDICATIONS:  Current Outpatient Medications  Medication Sig Dispense Refill  . acetaminophen (TYLENOL) 325 MG tablet You can take 2 every 4 hours as needed.  You can buy it over the counter DO NOT TAKE MORE THAN 4000 MG OF TYLENOL PER DAY.  IT CAN HARM YOUR LIVER.    Marland Kitchen amitriptyline (ELAVIL) 100 MG tablet Take 1 tablet (100 mg total) by mouth at bedtime. 30 tablet 3  . aspirin EC 81 MG tablet Take 81 mg by mouth daily.    Marland Kitchen gabapentin (NEURONTIN) 300 MG capsule Take 2 capsules (600 mg total) by mouth 3 (three) times daily. 90 capsule 3  . loperamide (IMODIUM) 2 MG capsule Take 1 capsule (2 mg total) by mouth 2 (two) times daily. Also available over the counter. You can take 1 extra dose in the afternoon if diarrhea is not improving. 60 capsule 4  . mesalamine (APRISO) 0.375 g 24 hr capsule Take 4 capsules by mouth daily.    . metoprolol succinate (TOPROL-XL) 25 MG 24 hr tablet Take 1 tablet (25 mg total) by mouth daily. 30 tablet 4  . mirtazapine (REMERON) 30 MG tablet TAKE 1 TABLET BY MOUTH AT BEDTIME 30 tablet 0  . nortriptyline (PAMELOR) 10 MG capsule Take 10 mg by mouth 2 (two) times daily.     . pantoprazole (PROTONIX) 40 MG tablet Take 1 tablet (40 mg total) by mouth 2 (two) times daily. 60 tablet 1  . simvastatin (ZOCOR) 20 MG tablet Take 20 mg  by mouth daily.    . temazepam (RESTORIL) 30 MG capsule Take 1 capsule (30 mg total) by mouth at bedtime. 30 capsule 0   No current facility-administered medications for this visit.    SURGICAL HISTORY:  Past Surgical History:  Procedure Laterality Date  . BACK SURGERY     cyst removal  . COLONOSCOPY    . LAPAROTOMY N/A 02/25/2017   Procedure: EXPLORATORY LAPAROTOMY MODIFIED GRAHAM'S PATCH;  Surgeon: Kinsinger, Arta Bruce, MD;  Location: [facility];  Service: General;  Laterality: N/A;  . MINOR PLACEMENT OF FIDUCIAL N/A 01/19/2017    Procedure: MINOR PLACEMENT OF FIDUCIAL x 6;  Surgeon: Collene Gobble, MD;  Location: [place];  Service: Thoracic;  Laterality: N/A;  . VIDEO BRONCHOSCOPY WITH ENDOBRONCHIAL NAVIGATION N/A 01/19/2017   Procedure: VIDEO BRONCHOSCOPY WITH ENDOBRONCHIAL NAVIGATION;  Surgeon: Collene Gobble, MD;  Location: [place];  Service: Thoracic;  Laterality: N/A;    REVIEW OF SYSTEMS:   Review of Systems  Constitutional: Negative for appetite change, chills, fatigue, fever and unexpected weight change.  HENT: Negative for mouth sores, nosebleeds, sore throat and trouble swallowing.  Eyes: Negative for eye problems and icterus.  Respiratory:Positive for baseline dyspnea on exertion.Negative for cough, hemoptysis, and wheezing.  Cardiovascular:Negative for leg swelling or chest pain.  Gastrointestinal:Positive for baseline diarrhea. Negative for abdominal pain, constipation, nausea and vomiting.  Genitourinary: Negative for bladder incontinence, difficulty urinating, dysuria, frequency and hematuria.  Musculoskeletal: Negative for gait problem, neck pain and neck stiffness.  Skin: Negative for itching and rash.  Neurological: Negative for dizziness, extremity weakness, gait problem, headaches, light-headedness and seizures.  Hematological: Negative for adenopathy. Does not bruise/bleed easily.  Psychiatric/Behavioral: Positive for insomnia. Negative for confusion, depression. The patient is not nervous/anxious    PHYSICAL EXAMINATION:  Blood pressure 128/86, pulse 74, temperature 97.7 F (36.5 C), temperature source Temporal, resp. rate 18, height 5\' 8"  (1.727 m), weight 172 lb 3.2 oz (78.1 kg), SpO2 96 %.  ECOG PERFORMANCE STATUS: 1 - Symptomatic but completely ambulatory  Physical Exam  Constitutional: Oriented to person, place, and time and well-developed, well-nourished, and in no distress.  HENT:  Head: Normocephalic and atraumatic.  Mouth/Throat: Oropharynx is clear and moist. No oropharyngeal  exudate.  Eyes: Conjunctivae are normal. Right eye exhibits no discharge. Left eye exhibits no discharge. No scleral icterus.  Neck: Normal range of motion. Neck supple.  Cardiovascular: Normal rate, regular rhythm, normal heart sounds and intact distal pulses.   Pulmonary/Chest: Effort normal. Quiet breath sounds in both lungs. No respiratory distress. No wheezes. No rales.  Abdominal: Soft. Bowel sounds are normal. Exhibits no distension and no mass. There is no tenderness.  Musculoskeletal: Normal range of motion. Exhibits no edema.  Lymphadenopathy:    No cervical adenopathy.  Neurological: Alert and oriented to person, place, and time. Exhibits normal muscle tone. Gait normal. Coordination normal.  Skin: Skin is warm and dry. No rash noted. Not diaphoretic. No erythema. No pallor.  Psychiatric: Mood, memory and judgment normal.  Vitals reviewed.  LABORATORY DATA: Lab Results  Component Value Date   WBC 9.2 04/03/2020   HGB 14.4 04/03/2020   HCT 43.4 04/03/2020   MCV 87.9 04/03/2020   PLT 244 04/03/2020      Chemistry      Component Value Date/Time   NA 137 02/07/2020 1336   NA 139 10/06/2017 1052   K 4.1 02/07/2020 1336   K 4.2 10/06/2017 1052   CL 104 02/07/2020 1336   CO2  26 02/07/2020 1336   CO2 25 10/06/2017 1052   BUN 13 02/07/2020 1336   BUN 13.2 10/06/2017 1052   CREATININE 0.97 02/07/2020 1336   CREATININE 0.9 10/06/2017 1052      Component Value Date/Time   CALCIUM 9.0 02/07/2020 1336   CALCIUM 9.5 10/06/2017 1052   ALKPHOS 67 02/07/2020 1336   ALKPHOS 68 10/06/2017 1052   AST 13 (L) 02/07/2020 1336   AST 14 10/06/2017 1052   ALT 9 02/07/2020 1336   ALT 13 10/06/2017 1052   BILITOT 0.6 02/07/2020 1336   BILITOT 0.44 10/06/2017 1052       RADIOGRAPHIC STUDIES:  No results found.   ASSESSMENT/PLAN:  This is a very pleasant 69 year old Caucasian male with stage IV non-small cell lung cancer, squamous cell carcinoma. He presented with multiple  bilateral pulmonary nodules. He was diagnosed in April 2018.  He underwent systemic chemotherapy with carboplatin and paclitaxel. He is status post 6 cycles. He tolerated it well except for the development of persistent peripheral neuropathy.  He was then on observation for 3 months.He showed signs of disease progression.  He is currently being treated with immunotherapy with nivolumab 480 mg IV every 4 weeks. He is status post26cycles.  The patient was seen with Dr. Julien Nordmann today.  Labs were reviewed.  Recommend that he proceed with cycle #27 today as scheduled.Re-discussed the importance of coming to his appointments to prevent disease progression. If challenging to make it to his appointments, offered a referral to another medical oncology practice closer to his home in [place], [place]. Additionally, also offered to take a break from treatment with restaging CT scans every 3 months. He will think about this but will continue with his treatment today with cycle #27.   We will see him back for a follow up visit in 4 weeks for evaluation before starting cycle #28   I will arrange for a restaging CT scan to be performed of the chest, abdomen, and pelvis prior to his next cycle of treatment.   The patient will continue taking gabapentin and amitriptyline for his peripheral neuropathy.  I have sent a refill for his Restoril to his pharmacy. Also discussed that he can get this medication prescribed by his PCP. We will not refill this medication without seeing him regularly.   The patient was advised to call immediately if he has any concerning symptoms in the interval. The patient voices understanding of current disease status and treatment options and is in agreement with the current care plan. All questions were answered. The patient knows to call the clinic with any problems, questions or concerns. We can certainly see the patient much sooner if necessary      Orders Placed This  Encounter  Procedures  . CT Chest W Contrast    Standing Status:   Future    Standing Expiration Date:   04/03/2021    Order Specific Question:   ** REASON FOR EXAM (FREE TEXT)    Answer:   Restaging Lung Cancer    Order Specific Question:   If indicated for the ordered procedure, I authorize the administration of contrast media per Radiology protocol    Answer:   Yes    Order Specific Question:   Preferred imaging location?    Answer:   [facility]    Order Specific Question:   Radiology Contrast Protocol - do NOT remove file path    Answer:   \\[email or web address]  . CT Abdomen Pelvis W Contrast  Standing Status:   Future    Standing Expiration Date:   04/03/2021    Order Specific Question:   ** REASON FOR EXAM (FREE TEXT)    Answer:   restaging Lung Cancer    Order Specific Question:   If indicated for the ordered procedure, I authorize the administration of contrast media per Radiology protocol    Answer:   Yes    Order Specific Question:   Preferred imaging location?    Answer:   [facility]    Order Specific Question:   Is Oral Contrast requested for this exam?    Answer:   Yes, Per Radiology protocol    Order Specific Question:   Radiology Contrast Protocol - do NOT remove file path    Answer:   \\[email or web address]     Indira Sorenson L Chaslyn Eisen, PA-C 04/03/20  ADDENDUM: Hematology/Oncology Attending: I had a face-to-face encounter with the patient today. I recommended his care plan. This is a 69 years old white male with metastatic non-small cell lung cancer, squamous cell carcinoma presented with multiple bilateral pulmonary nodules diagnosed in April 2018 status post systemic chemotherapy with carboplatin and paclitaxel for 6 cycles. The patient had evidence for disease progression and he was started on second line treatment with nivolumab every 4 weeks status post 26 cycles. The patient has been tolerating  this treatment well but unfortunately he continues to miss his treatment appointment and shows only when he needs refill for his sleep medications. I had a lengthy discussion with the patient today about his condition. I gave the patient the option of transferring his care to an oncology facility closer to home if logistic is an issue for him. He was also given the option of taking a break off treatment for 3 months with repeat imaging studies and if the patient has no evidence for progression, we can continue him on observation. The patient would like some time to think about his option. We will give him refill of Restoril today. He will come back for follow-up visit in 4 weeks for evaluation with repeat CT scan of the chest. The patient was advised to call immediately if he has any concerning symptoms in the interval.

## 2020-04-03 ENCOUNTER — Other Ambulatory Visit: Payer: Self-pay

## 2020-04-03 ENCOUNTER — Inpatient Hospital Stay: Payer: Medicare Other

## 2020-04-03 ENCOUNTER — Encounter: Payer: Self-pay | Admitting: General Practice

## 2020-04-03 ENCOUNTER — Inpatient Hospital Stay (HOSPITAL_BASED_OUTPATIENT_CLINIC_OR_DEPARTMENT_OTHER): Payer: Medicare Other | Admitting: Physician Assistant

## 2020-04-03 ENCOUNTER — Inpatient Hospital Stay: Payer: Medicare Other | Attending: Internal Medicine

## 2020-04-03 VITALS — BP 128/86 | HR 74 | Temp 97.7°F | Resp 18 | Ht 68.0 in | Wt 172.2 lb

## 2020-04-03 DIAGNOSIS — G4700 Insomnia, unspecified: Secondary | ICD-10-CM | POA: Diagnosis not present

## 2020-04-03 DIAGNOSIS — C3490 Malignant neoplasm of unspecified part of unspecified bronchus or lung: Secondary | ICD-10-CM

## 2020-04-03 DIAGNOSIS — Z5112 Encounter for antineoplastic immunotherapy: Secondary | ICD-10-CM

## 2020-04-03 DIAGNOSIS — R5382 Chronic fatigue, unspecified: Secondary | ICD-10-CM

## 2020-04-03 DIAGNOSIS — G629 Polyneuropathy, unspecified: Secondary | ICD-10-CM | POA: Diagnosis not present

## 2020-04-03 DIAGNOSIS — C7800 Secondary malignant neoplasm of unspecified lung: Secondary | ICD-10-CM

## 2020-04-03 DIAGNOSIS — I10 Essential (primary) hypertension: Secondary | ICD-10-CM | POA: Diagnosis not present

## 2020-04-03 LAB — CMP (CANCER CENTER ONLY)
ALT: 9 U/L (ref 0–44)
AST: 12 U/L — ABNORMAL LOW (ref 15–41)
Albumin: 3.9 g/dL (ref 3.5–5.0)
Alkaline Phosphatase: 79 U/L (ref 38–126)
Anion gap: 8 (ref 5–15)
BUN: 10 mg/dL (ref 8–23)
CO2: 27 mmol/L (ref 22–32)
Calcium: 9.3 mg/dL (ref 8.9–10.3)
Chloride: 104 mmol/L (ref 98–111)
Creatinine: 0.86 mg/dL (ref 0.61–1.24)
GFR, Est AFR Am: >60 mL/min (ref >60)
GFR, Estimated: >60 mL/min (ref >60)
Glucose, Bld: 100 mg/dL — ABNORMAL HIGH (ref 70–99)
Potassium: 4.2 mmol/L (ref 3.5–5.1)
Sodium: 139 mmol/L (ref 135–145)
Total Bilirubin: 0.6 mg/dL (ref 0.3–1.2)
Total Protein: 7.4 g/dL (ref 6.5–8.1)

## 2020-04-03 LAB — CBC WITH DIFFERENTIAL (CANCER CENTER ONLY)
Abs Immature Granulocytes: 0.02 10*3/uL (ref 0.00–0.07)
Basophils Absolute: 0.1 10*3/uL (ref 0.0–0.1)
Basophils Relative: 1 %
Eosinophils Absolute: 0.3 10*3/uL (ref 0.0–0.5)
Eosinophils Relative: 3 %
HCT: 43.4 % (ref 39.0–52.0)
Hemoglobin: 14.4 g/dL (ref 13.0–17.0)
Immature Granulocytes: 0 %
Lymphocytes Relative: 41 %
Lymphs Abs: 3.8 10*3/uL (ref 0.7–4.0)
MCH: 29.1 pg (ref 26.0–34.0)
MCHC: 33.2 g/dL (ref 30.0–36.0)
MCV: 87.9 fL (ref 80.0–100.0)
Monocytes Absolute: 0.6 10*3/uL (ref 0.1–1.0)
Monocytes Relative: 7 %
Neutro Abs: 4.4 10*3/uL (ref 1.7–7.7)
Neutrophils Relative %: 48 %
Platelet Count: 244 10*3/uL (ref 150–400)
RBC: 4.94 MIL/uL (ref 4.22–5.81)
RDW: 14 % (ref 11.5–15.5)
WBC Count: 9.2 10*3/uL (ref 4.0–10.5)
nRBC: 0 % (ref 0.0–0.2)

## 2020-04-03 LAB — TSH: TSH: 1.901 u[IU]/mL (ref 0.320–4.118)

## 2020-04-03 MED ORDER — TEMAZEPAM 30 MG PO CAPS: 30.0000 mg | ORAL_CAPSULE | Freq: Every day | ORAL | 0 refills | Status: DC

## 2020-04-03 MED ORDER — SODIUM CHLORIDE 0.9 % IV SOLN
Freq: Once | INTRAVENOUS | Status: AC
  Filled 2020-04-03: qty 250

## 2020-04-03 MED ORDER — SODIUM CHLORIDE 0.9 % IV SOLN
480.0000 mg | Freq: Once | INTRAVENOUS | Status: AC
  Administered 2020-04-03: 480 mg via INTRAVENOUS
  Filled 2020-04-03: qty 48

## 2020-04-03 NOTE — Progress Notes (Signed)
[place] CSW Progress Notes  Met w patient in infusion.  Provided second of four [employer] $50 disbursements to assist w food/gas.  He has exhausted his [employer].  There are no other outside financial assistance programs that he is currently eligible for and/or are open at this time.  He was given information on Triage Cancer and Patient [place], both resources to explore options for help w unpaid medical bills which are his major stressor at this time.  He has recently switched insurances - he states from an HMO to a PPO policy.  He was advised by his agent that this would help lessen his bills going forward.  He was also encouraged to explore Medicaid as he has never applied for this program - that can also help with bills not covered by his Medicare policy.    Edwyna Shell, LCSW Clinical Social Worker Phone:  [phone] Cell:  [phone]

## 2020-04-04 NOTE — Addendum Note (Signed)
Addended by: [name] on: 04/04/2020 06:21 PM   Modules accepted: Level of Service

## 2020-04-09 ENCOUNTER — Telehealth: Payer: Self-pay | Admitting: Medical Oncology

## 2020-04-09 NOTE — Telephone Encounter (Signed)
He needs his labs and scan early on the same day as his appt with [facility].He is scheduled to see Julien Nordmann on July 15th at 2 p

## 2020-04-11 ENCOUNTER — Telehealth: Payer: Self-pay | Admitting: General Practice

## 2020-04-11 NOTE — Telephone Encounter (Signed)
[place] CSW Progress Notes  Call from patient, he has lost the papers given him last week w contact information for Triage Cancer [phone]) and Patient [employer] [phone]).  He is planning to call these agencies as he is seeking help w his unpaid medical bills.  Edwyna Shell, LCSW Clinical Social Worker Phone:  [phone] Cell:  [phone]

## 2020-04-28 ENCOUNTER — Telehealth: Payer: Self-pay | Admitting: Medical Oncology

## 2020-04-28 NOTE — Telephone Encounter (Signed)
LVM -Told pt no refilling of his prescriptions until he comes in on  wed.

## 2020-04-28 NOTE — Telephone Encounter (Signed)
Refills requested: Simvastatin, Amitriptyline, and Temazepam

## 2020-04-29 NOTE — Progress Notes (Signed)
[facility] OFFICE PROGRESS NOTE  System, Pcp Not In No address on file  DIAGNOSIS: Stage IV (T3, N0, M1 a) non-small cell lung cancer, squamous cell carcinoma presented with multiple bilateral pulmonary nodules diagnosed in April 2018.  PRIOR THERAPY: Systemic chemotherapy with carboplatin for AUC of 5 and paclitaxel 175 MG/M2 every 3 weeks with Neulasta support. Status post 6 cycles.  CURRENT THERAPY: Second line treatment with immunotherapy was Nivolumab 480 mg IV every 4 weeks. First cycle November 14, 2017. Status post27cycles  INTERVAL HISTORY: [name] [name] 69 y.o. male returns to the clinic for a follow up visit. The patient is feeling well today without any concerning complaints. The patient continues to tolerate treatment with immunotherapy with nivolumab well without any adverse side effects. Today, the patient is feeling fairly well without any concerning complaints.  The patient has insomnia for which he is prescribed Restoril. He is requesting a refill. He denies any recent fever, chills, night sweats, or weight loss.  He reports his baseline shortness of breath with exertion.  He denies any significant or unusual coughs. He recently saw his PCP for a hernia and is scheduled to see a Psychologist, sport and exercise in September. He denies any nausea, vomiting, or constipation. He endorses his baseline diarrhea for which he takes Imodium. He states his diarrhea is under better control recently. He denies any recent melena. He denies any headache or visual changes.  He denies any rashes or skin changes.  The patient had a restaging restaging CT scan. The patient is here today for evaluation and to review his scan before starting cycle #28.    MEDICAL HISTORY: Past Medical History:  Diagnosis Date  . Depression 01/29/2017  . Dyspnea   . Encounter for antineoplastic chemotherapy 01/29/2017  . Goals of care, counseling/discussion 01/29/2017  . Hypertension   . Metastatic squamous cell carcinoma  to lung, unspecified laterality ([place]) 01/28/2017  . Stroke [facility])     ALLERGIES:  has No Known Allergies.  MEDICATIONS:  Current Outpatient Medications  Medication Sig Dispense Refill  . acetaminophen (TYLENOL) 325 MG tablet You can take 2 every 4 hours as needed.  You can buy it over the counter DO NOT TAKE MORE THAN 4000 MG OF TYLENOL PER DAY.  IT CAN HARM YOUR LIVER.    Marland Kitchen amitriptyline (ELAVIL) 100 MG tablet Take 1 tablet (100 mg total) by mouth at bedtime. 30 tablet 3  . aspirin EC 81 MG tablet Take 81 mg by mouth daily.    Marland Kitchen gabapentin (NEURONTIN) 300 MG capsule Take 2 capsules (600 mg total) by mouth 3 (three) times daily. 90 capsule 3  . loperamide (IMODIUM) 2 MG capsule Take 1 capsule (2 mg total) by mouth 2 (two) times daily. Also available over the counter. You can take 1 extra dose in the afternoon if diarrhea is not improving. 60 capsule 4  . metoprolol succinate (TOPROL-XL) 25 MG 24 hr tablet Take 1 tablet (25 mg total) by mouth daily. 30 tablet 4  . mirtazapine (REMERON) 30 MG tablet TAKE 1 TABLET BY MOUTH AT BEDTIME 30 tablet 0  . simvastatin (ZOCOR) 20 MG tablet Take 20 mg by mouth daily.    . temazepam (RESTORIL) 30 MG capsule Take 1 capsule (30 mg total) by mouth at bedtime. 30 capsule 0  . mesalamine (APRISO) 0.375 g 24 hr capsule Take 4 capsules by mouth daily. (Patient not taking: Reported on 05/01/2020)    . nortriptyline (PAMELOR) 10 MG capsule Take 10 mg  by mouth 2 (two) times daily.  (Patient not taking: Reported on 05/01/2020)    . pantoprazole (PROTONIX) 40 MG tablet Take 1 tablet (40 mg total) by mouth 2 (two) times daily. (Patient not taking: Reported on 05/01/2020) 60 tablet 1   No current facility-administered medications for this visit.   Facility-Administered Medications Ordered in Other Visits  Medication Dose Route Frequency Provider Last Rate Last Admin  . sodium chloride (PF) 0.9 % injection             SURGICAL HISTORY:  Past Surgical History:   Procedure Laterality Date  . BACK SURGERY     cyst removal  . COLONOSCOPY    . LAPAROTOMY N/A 02/25/2017   Procedure: EXPLORATORY LAPAROTOMY MODIFIED GRAHAM'S PATCH;  Surgeon: Kinsinger, Arta Bruce, MD;  Location: [facility];  Service: General;  Laterality: N/A;  . MINOR PLACEMENT OF FIDUCIAL N/A 01/19/2017   Procedure: MINOR PLACEMENT OF FIDUCIAL x 6;  Surgeon: Collene Gobble, MD;  Location: [facility];  Service: Thoracic;  Laterality: N/A;  . VIDEO BRONCHOSCOPY WITH ENDOBRONCHIAL NAVIGATION N/A 01/19/2017   Procedure: VIDEO BRONCHOSCOPY WITH ENDOBRONCHIAL NAVIGATION;  Surgeon: Collene Gobble, MD;  Location: [facility];  Service: Thoracic;  Laterality: N/A;    REVIEW OF SYSTEMS:   Review of Systems  Constitutional: Negative for appetite change, chills, fatigue, fever and unexpected weight change.  HENT: Negative for mouth sores, nosebleeds, sore throat and trouble swallowing.  Eyes: Negative for eye problems and icterus.  Respiratory:Positive for baseline dyspnea on exertion.Negative for cough, hemoptysis, and wheezing.  Cardiovascular:Negative for leg swellingor chest pain.  Gastrointestinal:Positive for baseline diarrhea.Negative for abdominal pain, constipation, nausea and vomiting.  Genitourinary: Negative for bladder incontinence, difficulty urinating, dysuria, frequency and hematuria.  Musculoskeletal: Negative for gait problem, neck pain and neck stiffness.  Skin: Negative for itching and rash.  Neurological: Negative for dizziness, extremity weakness, gait problem, headaches, light-headedness and seizures.  Hematological: Negative for adenopathy. Does not bruise/bleed easily.  Psychiatric/Behavioral: Positive for insomnia. Negative for confusion, depression. The patient is not nervous/anxious     PHYSICAL EXAMINATION:  Blood pressure (!) 149/85, pulse 71, temperature 97.8 F (36.6 C), temperature source Temporal, resp. rate 17, height 5\' 8"  (1.727 m), weight 174 lb 1.6 oz (79 kg), SpO2  96 %.  ECOG PERFORMANCE STATUS: 1 - Symptomatic but completely ambulatory  Physical Exam  Constitutional: Oriented to person, place, and time and well-developed, well-nourished, and in no distress.  HENT:  Head: Normocephalic and atraumatic.  Mouth/Throat: Oropharynx is clear and moist. No oropharyngeal exudate.  Eyes: Conjunctivae are normal. Right eye exhibits no discharge. Left eye exhibits no discharge. No scleral icterus.  Neck: Normal range of motion. Neck supple.  Cardiovascular: Normal rate, regular rhythm, normal heart sounds and intact distal pulses.   Pulmonary/Chest: Effort normal. Quiet breath sounds in both lungs. No respiratory distress. No wheezes. No rales.  Abdominal: Soft. Bowel sounds are normal. Exhibits no distension and no mass. There is no tenderness.  Musculoskeletal: Normal range of motion. Exhibits no edema.  Lymphadenopathy:    No cervical adenopathy.  Neurological: Alert and oriented to person, place, and time. Exhibits normal muscle tone. Gait normal. Coordination normal.  Skin: Skin is warm and dry. No rash noted. Not diaphoretic. No erythema. No pallor.  Psychiatric: Mood, memory and judgment normal.  Vitals reviewed.  LABORATORY DATA: Lab Results  Component Value Date   WBC 7.3 05/01/2020   HGB 13.5 05/01/2020   HCT 41.3 05/01/2020   MCV 90.8  05/01/2020   PLT 224 05/01/2020      Chemistry      Component Value Date/Time   NA 141 05/01/2020 1314   NA 139 10/06/2017 1052   K 4.4 05/01/2020 1314   K 4.2 10/06/2017 1052   CL 105 05/01/2020 1314   CO2 27 05/01/2020 1314   CO2 25 10/06/2017 1052   BUN 12 05/01/2020 1314   BUN 13.2 10/06/2017 1052   CREATININE 0.83 05/01/2020 1314   CREATININE 0.9 10/06/2017 1052      Component Value Date/Time   CALCIUM 8.9 05/01/2020 1314   CALCIUM 9.5 10/06/2017 1052   ALKPHOS 68 05/01/2020 1314   ALKPHOS 68 10/06/2017 1052   AST 13 (L) 05/01/2020 1314   AST 14 10/06/2017 1052   ALT 10 05/01/2020 1314    ALT 13 10/06/2017 1052   BILITOT 0.4 05/01/2020 1314   BILITOT 0.44 10/06/2017 1052       RADIOGRAPHIC STUDIES:  CT Chest W Contrast  Result Date: 05/01/2020 CLINICAL DATA:  Metastatic non-small cell lung cancer (squamous cell) most recent therapy 02/07/2020. EXAM: CT CHEST, ABDOMEN, AND PELVIS WITH CONTRAST TECHNIQUE: Multidetector CT imaging of the chest, abdomen and pelvis was performed following the standard protocol during bolus administration of intravenous contrast. CONTRAST:  155mL OMNIPAQUE IOHEXOL 300 MG/ML  SOLN COMPARISON:  Multiple exams, including 02/05/2020 FINDINGS: CT CHEST FINDINGS Cardiovascular: Coronary, aortic arch, and branch vessel atherosclerotic vascular disease. Ascending thoracic aortic aneurysm 4.2 cm in diameter, stable. Mediastinum/Nodes: No pathologic thoracic adenopathy. Lungs/Pleura: Biapical pleuroparenchymal scarring. Centrilobular emphysema. Right upper lobe nodule 1.0 by 0.7 cm on image 38/4, previously 1.0 by 0.8 cm by my measurement. Essentially stable. Airway plugging posteriorly in the right upper lobe on image 54/4 adjacent to a small metal fiducial. Bandlike scarring posteriorly in the right upper lobe on image 53/4, stable. Airway thickening is present, suggesting bronchitis or reactive airways disease. Peripheral interstitial accentuation in the lungs. Stable scarring and therapy related findings in the left upper lobe for example on image 58/4 with adjacent small fiducials. Bronchiectasis in the left upper lobe on image 75/4, stable. Stable left lower lobe subpleural nodule measuring 0.4 cm in diameter on image 104/4. At the site of prior nodularity of concern along the left hemidiaphragm, local bandlike nodularity measures about 0.6 by 0.5 cm on image 109/4, formerly approximately 1.0 by 1.1 cm, intervally improved. Assess flap margins and could be due to scarring or atelectasis. Musculoskeletal: Stable left posterior ninth rib deformity compatible with  healed fracture. Mild thoracic spondylosis. Mild elevation of left hemidiaphragm, stable. CT ABDOMEN PELVIS FINDINGS Hepatobiliary: Unremarkable Pancreas: Unremarkable Spleen: Unremarkable Adrenals/Urinary Tract: Low-density fullness of both adrenal glands similar to prior, possibly from hyperplasia or a adenomas. Chronic scarring, right kidney upper pole. Stomach/Bowel: Unremarkable Vascular/Lymphatic: Fusiform infrarenal abdominal aortic aneurysm 3.4 cm in diameter on image 80/2, stable. A celiac chain lymph node measuring 0.9 cm in short axis on image 65/2 is present, and stable compared to the prior exam. No overtly pathologic adenopathy observed. Reproductive: Unremarkable Other: No supplemental non-categorized findings. Musculoskeletal: Bridging spurring of the sacroiliac joints. Small umbilical hernia contains adipose tissue and there is a smaller supraumbilical hernia. IMPRESSION: 1. Generally stable bilateral pulmonary nodules and stable therapy related findings in the left upper lobe. The left basilar density seen along the left hemidiaphragm as a new nodule on the prior exam is reduced in size and conspicuity, and may reflect scarring or atelectasis. Surveillance is suggested. 2. Ascending thoracic aortic aneurysm and fusiform  infrarenal abdominal aortic aneurysm, unchanged from prior. These can be surveilled in the context of the patient's follow up oncology imaging. 3. Other imaging findings of potential clinical significance: Coronary atherosclerosis. Airway thickening is present, suggesting bronchitis or reactive airways disease. Chronic scarring, right kidney upper pole. Small umbilical and supraumbilical hernias containing adipose tissue. Low-density thickening of the adrenal glands, possibly from hyperplasia or small adenomas. 4. Emphysema and aortic atherosclerosis. Aortic Atherosclerosis (ICD10-I70.0) and Emphysema (ICD10-J43.9). Aortic aneurysm NOS (ICD10-I71.9). Electronically Signed   By:  Van Clines M.D.   On: 05/01/2020 15:47   CT Abdomen Pelvis W Contrast  Result Date: 05/01/2020 CLINICAL DATA:  Metastatic non-small cell lung cancer (squamous cell) most recent therapy 02/07/2020. EXAM: CT CHEST, ABDOMEN, AND PELVIS WITH CONTRAST TECHNIQUE: Multidetector CT imaging of the chest, abdomen and pelvis was performed following the standard protocol during bolus administration of intravenous contrast. CONTRAST:  14mL OMNIPAQUE IOHEXOL 300 MG/ML  SOLN COMPARISON:  Multiple exams, including 02/05/2020 FINDINGS: CT CHEST FINDINGS Cardiovascular: Coronary, aortic arch, and branch vessel atherosclerotic vascular disease. Ascending thoracic aortic aneurysm 4.2 cm in diameter, stable. Mediastinum/Nodes: No pathologic thoracic adenopathy. Lungs/Pleura: Biapical pleuroparenchymal scarring. Centrilobular emphysema. Right upper lobe nodule 1.0 by 0.7 cm on image 38/4, previously 1.0 by 0.8 cm by my measurement. Essentially stable. Airway plugging posteriorly in the right upper lobe on image 54/4 adjacent to a small metal fiducial. Bandlike scarring posteriorly in the right upper lobe on image 53/4, stable. Airway thickening is present, suggesting bronchitis or reactive airways disease. Peripheral interstitial accentuation in the lungs. Stable scarring and therapy related findings in the left upper lobe for example on image 58/4 with adjacent small fiducials. Bronchiectasis in the left upper lobe on image 75/4, stable. Stable left lower lobe subpleural nodule measuring 0.4 cm in diameter on image 104/4. At the site of prior nodularity of concern along the left hemidiaphragm, local bandlike nodularity measures about 0.6 by 0.5 cm on image 109/4, formerly approximately 1.0 by 1.1 cm, intervally improved. Assess flap margins and could be due to scarring or atelectasis. Musculoskeletal: Stable left posterior ninth rib deformity compatible with healed fracture. Mild thoracic spondylosis. Mild elevation of left  hemidiaphragm, stable. CT ABDOMEN PELVIS FINDINGS Hepatobiliary: Unremarkable Pancreas: Unremarkable Spleen: Unremarkable Adrenals/Urinary Tract: Low-density fullness of both adrenal glands similar to prior, possibly from hyperplasia or a adenomas. Chronic scarring, right kidney upper pole. Stomach/Bowel: Unremarkable Vascular/Lymphatic: Fusiform infrarenal abdominal aortic aneurysm 3.4 cm in diameter on image 80/2, stable. A celiac chain lymph node measuring 0.9 cm in short axis on image 65/2 is present, and stable compared to the prior exam. No overtly pathologic adenopathy observed. Reproductive: Unremarkable Other: No supplemental non-categorized findings. Musculoskeletal: Bridging spurring of the sacroiliac joints. Small umbilical hernia contains adipose tissue and there is a smaller supraumbilical hernia. IMPRESSION: 1. Generally stable bilateral pulmonary nodules and stable therapy related findings in the left upper lobe. The left basilar density seen along the left hemidiaphragm as a new nodule on the prior exam is reduced in size and conspicuity, and may reflect scarring or atelectasis. Surveillance is suggested. 2. Ascending thoracic aortic aneurysm and fusiform infrarenal abdominal aortic aneurysm, unchanged from prior. These can be surveilled in the context of the patient's follow up oncology imaging. 3. Other imaging findings of potential clinical significance: Coronary atherosclerosis. Airway thickening is present, suggesting bronchitis or reactive airways disease. Chronic scarring, right kidney upper pole. Small umbilical and supraumbilical hernias containing adipose tissue. Low-density thickening of the adrenal glands, possibly from hyperplasia or  small adenomas. 4. Emphysema and aortic atherosclerosis. Aortic Atherosclerosis (ICD10-I70.0) and Emphysema (ICD10-J43.9). Aortic aneurysm NOS (ICD10-I71.9). Electronically Signed   By: Van Clines M.D.   On: 05/01/2020 15:47     ASSESSMENT/PLAN:   This is a very pleasant 69 year old Caucasian male with stage IV non-small cell lung cancer, squamous cell carcinoma. He presented with multiple bilateral pulmonary nodules. He was diagnosed in April 2018.  He underwent systemic chemotherapy with carboplatin and paclitaxel. He is status post 6 cycles. He tolerated it well except for the development of persistent peripheral neuropathy.  He was then on observation for 3 months.He showed signs of disease progression.  He is currently being treated with immunotherapy with nivolumab 480 mg IV every 4 weeks. He is status post27cycles.  The patient was seen with Dr. Julien Nordmann today.  Labs were reviewed. The patient recently had a restaging CT scan performed. Dr. Julien Nordmann personally and independently reviewed the scan and discussed the results with the patient. The scan showed no evidence of disease progression.   At his last appointment, the patint was inquiring about how long he would be on treatment. He was given the option of continuing on treatment as planned vs. transferring his care to an oncology facility closer to home if logistic is an issue for him vs. He was also given the option of taking a break off treatment for 3 months with repeat imaging studies and if the patient has no evidence for progression, we can continue him on observation.  The patient is now interested in taking a break from treatment. I will arrange for a CT scan of the chest, abdomen, and pelvis to be performed in 3 months. We will see him back at that time for evaluation.   The patient requested refills of his restoril, amitriptyline, and gabapentin today as we prescribed these medications for him for his insomnia and peripheral neuropathy. The patient jokingly stated that he could overdose with needing all of these refills. We questioned the patient and discussed the severity of making jokes such as this. The patient reiterated that he was kidding and that he has  been on these medications for a long time and that he has no intention of self harm to himself or others and that he is happy in his life. We strongly advised the patient to refrain from making jokes about serious issues such as this in the future as that we take them seriously.   The patient was advised to call immediately if he has any concerning symptoms in the interval. The patient voices understanding of current disease status and treatment options and is in agreement with the current care plan. All questions were answered. The patient knows to call the clinic with any problems, questions or concerns. We can certainly see the patient much sooner if necessary  Orders Placed This Encounter  Procedures  . CT Chest W Contrast    Standing Status:   Future    Standing Expiration Date:   05/01/2021    Order Specific Question:   ** REASON FOR EXAM (FREE TEXT)    Answer:   Restaging lung cancer    Order Specific Question:   If indicated for the ordered procedure, I authorize the administration of contrast media per Radiology protocol    Answer:   Yes    Order Specific Question:   Preferred imaging location?    Answer:   [facility]    Order Specific Question:   Radiology Contrast Protocol - do  NOT remove file path    Answer:   \\[email or web address]  . CT Abdomen Pelvis W Contrast    Standing Status:   Future    Standing Expiration Date:   05/01/2021    Order Specific Question:   ** REASON FOR EXAM (FREE TEXT)    Answer:   Restaging Lung cancer    Order Specific Question:   If indicated for the ordered procedure, I authorize the administration of contrast media per Radiology protocol    Answer:   Yes    Order Specific Question:   Preferred imaging location?    Answer:   [facility]    Order Specific Question:   Is Oral Contrast requested for this exam?    Answer:   Yes, Per Radiology protocol    Order Specific Question:   Radiology Contrast Protocol -  do NOT remove file path    Answer:   [email or web address]     Jarvin Ogren L Rainie Crenshaw, PA-C 05/01/20  ADDENDUM: Hematology/Oncology Attending: I had a face-to-face encounter with the patient today.  I recommended his care plan.  This is a very pleasant 69 years old white male with metastatic non-small cell lung cancer, squamous cell carcinoma status post systemic chemotherapy with carboplatin and paclitaxel for 6 cycles.  The patient tolerated this treatment well except for peripheral neuropathy. He had evidence for disease progression and started second line treatment with immunotherapy with nivolumab 480 mg IV every 4 weeks.  Status post 27 cycles.  The patient has been tolerating this treatment well with no concerning adverse but he was noncompliant with the treatment because of his work schedule and travel. He is here today for evaluation with repeat CT scan of the chest, abdomen and pelvis.  I personally and independently reviewed the scans and discussed the results with the patient today.  His scan showed no concerning findings for disease progression. I gave the patient the option of continuous treatment with nivolumab every 4 weeks versus taking a break of the treatment because of his schedule and logistic issues.  The patient would like to take a break of treatment. We will see him back for follow-up visit in 3 months with repeat CT scan of the chest, abdomen pelvis for restaging of his disease after this cycle. The patient requested refill of his Restoril and amitriptyline as well as gabapentin.  He joked about overdose and when he was confronted that we take these issues seriously the patient mentioned that he was joking and he would never hurt himself or other and he loves life.  He has been on this medication for several years now with no indication of abuse so far. The patient was strongly advised not to make this kind of jokes in the future otherwise we will  not be able to refill his medications. He was advised to call immediately if he has any other concerning symptoms in the interval. Disclaimer: This note was dictated with voice recognition software. Similar sounding words can inadvertently be transcribed and may be missed upon review.  Eilleen Kempf, MD 05/01/20

## 2020-05-01 ENCOUNTER — Other Ambulatory Visit: Payer: Self-pay

## 2020-05-01 ENCOUNTER — Encounter: Payer: Self-pay | Admitting: Physician Assistant

## 2020-05-01 ENCOUNTER — Inpatient Hospital Stay: Payer: Medicare Other | Attending: Internal Medicine

## 2020-05-01 ENCOUNTER — Inpatient Hospital Stay (HOSPITAL_BASED_OUTPATIENT_CLINIC_OR_DEPARTMENT_OTHER): Payer: Medicare Other | Admitting: Physician Assistant

## 2020-05-01 ENCOUNTER — Ambulatory Visit (HOSPITAL_COMMUNITY)
Admission: RE | Admit: 2020-05-01 | Discharge: 2020-05-01 | Disposition: A | Discharge status: Home / Self Care | Payer: Medicare Other | Source: Ambulatory Visit | Attending: Physician Assistant | Admitting: Physician Assistant

## 2020-05-01 ENCOUNTER — Inpatient Hospital Stay: Payer: Medicare Other

## 2020-05-01 DIAGNOSIS — J439 Emphysema, unspecified: Secondary | ICD-10-CM | POA: Insufficient documentation

## 2020-05-01 DIAGNOSIS — C7800 Secondary malignant neoplasm of unspecified lung: Secondary | ICD-10-CM

## 2020-05-01 DIAGNOSIS — I719 Aortic aneurysm of unspecified site, without rupture: Secondary | ICD-10-CM | POA: Diagnosis not present

## 2020-05-01 DIAGNOSIS — C7931 Secondary malignant neoplasm of brain: Secondary | ICD-10-CM | POA: Diagnosis not present

## 2020-05-01 DIAGNOSIS — C3490 Malignant neoplasm of unspecified part of unspecified bronchus or lung: Secondary | ICD-10-CM

## 2020-05-01 DIAGNOSIS — Z79899 Other long term (current) drug therapy: Secondary | ICD-10-CM | POA: Diagnosis not present

## 2020-05-01 DIAGNOSIS — G4700 Insomnia, unspecified: Secondary | ICD-10-CM

## 2020-05-01 DIAGNOSIS — Z5111 Encounter for antineoplastic chemotherapy: Secondary | ICD-10-CM

## 2020-05-01 DIAGNOSIS — R5382 Chronic fatigue, unspecified: Secondary | ICD-10-CM

## 2020-05-01 DIAGNOSIS — G629 Polyneuropathy, unspecified: Secondary | ICD-10-CM | POA: Diagnosis not present

## 2020-05-01 DIAGNOSIS — I10 Essential (primary) hypertension: Secondary | ICD-10-CM | POA: Diagnosis not present

## 2020-05-01 DIAGNOSIS — Z5112 Encounter for antineoplastic immunotherapy: Secondary | ICD-10-CM | POA: Insufficient documentation

## 2020-05-01 LAB — CMP (CANCER CENTER ONLY)
ALT: 10 U/L (ref 0–44)
AST: 13 U/L — ABNORMAL LOW (ref 15–41)
Albumin: 3.6 g/dL (ref 3.5–5.0)
Alkaline Phosphatase: 68 U/L (ref 38–126)
Anion gap: 9 (ref 5–15)
BUN: 12 mg/dL (ref 8–23)
CO2: 27 mmol/L (ref 22–32)
Calcium: 8.9 mg/dL (ref 8.9–10.3)
Chloride: 105 mmol/L (ref 98–111)
Creatinine: 0.83 mg/dL (ref 0.61–1.24)
GFR, Est AFR Am: >60 mL/min (ref >60)
GFR, Estimated: >60 mL/min (ref >60)
Glucose, Bld: 88 mg/dL (ref 70–99)
Potassium: 4.4 mmol/L (ref 3.5–5.1)
Sodium: 141 mmol/L (ref 135–145)
Total Bilirubin: 0.4 mg/dL (ref 0.3–1.2)
Total Protein: 6.9 g/dL (ref 6.5–8.1)

## 2020-05-01 LAB — CBC WITH DIFFERENTIAL (CANCER CENTER ONLY)
Abs Immature Granulocytes: 0.01 10*3/uL (ref 0.00–0.07)
Basophils Absolute: 0.1 10*3/uL (ref 0.0–0.1)
Basophils Relative: 1 %
Eosinophils Absolute: 0.2 10*3/uL (ref 0.0–0.5)
Eosinophils Relative: 3 %
HCT: 41.3 % (ref 39.0–52.0)
Hemoglobin: 13.5 g/dL (ref 13.0–17.0)
Immature Granulocytes: 0 %
Lymphocytes Relative: 37 %
Lymphs Abs: 2.7 10*3/uL (ref 0.7–4.0)
MCH: 29.7 pg (ref 26.0–34.0)
MCHC: 32.7 g/dL (ref 30.0–36.0)
MCV: 90.8 fL (ref 80.0–100.0)
Monocytes Absolute: 0.4 10*3/uL (ref 0.1–1.0)
Monocytes Relative: 6 %
Neutro Abs: 3.8 10*3/uL (ref 1.7–7.7)
Neutrophils Relative %: 53 %
Platelet Count: 224 10*3/uL (ref 150–400)
RBC: 4.55 MIL/uL (ref 4.22–5.81)
RDW: 13.8 % (ref 11.5–15.5)
WBC Count: 7.3 10*3/uL (ref 4.0–10.5)
nRBC: 0 % (ref 0.0–0.2)

## 2020-05-01 LAB — TSH: TSH: 1.263 u[IU]/mL (ref 0.320–4.118)

## 2020-05-01 MED ORDER — TEMAZEPAM 30 MG PO CAPS: 30.0000 mg | ORAL_CAPSULE | Freq: Every day | ORAL | 0 refills | Status: DC

## 2020-05-01 MED ORDER — SODIUM CHLORIDE 0.9 % IV SOLN
480.0000 mg | Freq: Once | INTRAVENOUS | Status: AC
  Administered 2020-05-01: 480 mg via INTRAVENOUS
  Filled 2020-05-01: qty 48

## 2020-05-01 MED ORDER — IOHEXOL 300 MG/ML  SOLN
100.0000 mL | Freq: Once | INTRAMUSCULAR | Status: AC | PRN
  Administered 2020-05-01: 100 mL via INTRAVENOUS

## 2020-05-01 MED ORDER — SODIUM CHLORIDE 0.9 % IV SOLN
Freq: Once | INTRAVENOUS | Status: AC
  Filled 2020-05-01: qty 250

## 2020-05-01 MED ORDER — GABAPENTIN 300 MG PO CAPS: 600.0000 mg | ORAL_CAPSULE | Freq: Three times a day (TID) | ORAL | 3 refills | Status: DC

## 2020-05-01 MED ORDER — SODIUM CHLORIDE (PF) 0.9 % IJ SOLN
INTRAMUSCULAR | Status: AC
  Filled 2020-05-01: qty 50

## 2020-05-01 MED ORDER — AMITRIPTYLINE HCL 100 MG PO TABS: 100.0000 mg | ORAL_TABLET | Freq: Every day | ORAL | 3 refills | Status: DC

## 2020-05-01 NOTE — Patient Instructions (Signed)
[facility] Discharge Instructions for Patients Receiving Chemotherapy  Today you received the following chemotherapy agents: nivolumab.  To help prevent nausea and vomiting after your treatment, we encourage you to take your nausea medication as directed.   If you develop nausea and vomiting that is not controlled by your nausea medication, call the clinic.   BELOW ARE SYMPTOMS THAT SHOULD BE REPORTED IMMEDIATELY:  *FEVER GREATER THAN 100.5 F  *CHILLS WITH OR WITHOUT FEVER  NAUSEA AND VOMITING THAT IS NOT CONTROLLED WITH YOUR NAUSEA MEDICATION  *UNUSUAL SHORTNESS OF BREATH  *UNUSUAL BRUISING OR BLEEDING  TENDERNESS IN MOUTH AND THROAT WITH OR WITHOUT PRESENCE OF ULCERS  *URINARY PROBLEMS  *BOWEL PROBLEMS  UNUSUAL RASH Items with * indicate a potential emergency and should be followed up as soon as possible.  Feel free to call the clinic should you have any questions or concerns. The clinic phone number is [phone].  Please show the CHEMO ALERT CARD at check-in to the Emergency Department and triage nurse.   

## 2020-05-05 ENCOUNTER — Other Ambulatory Visit: Payer: Self-pay | Admitting: Internal Medicine

## 2020-05-05 DIAGNOSIS — C7931 Secondary malignant neoplasm of brain: Secondary | ICD-10-CM

## 2020-05-05 DIAGNOSIS — C7800 Secondary malignant neoplasm of unspecified lung: Secondary | ICD-10-CM

## 2020-05-05 DIAGNOSIS — Z5111 Encounter for antineoplastic chemotherapy: Secondary | ICD-10-CM

## 2020-05-05 NOTE — Telephone Encounter (Signed)
Rx Request 

## 2020-05-06 ENCOUNTER — Telehealth: Payer: Self-pay | Admitting: Physician Assistant

## 2020-05-06 NOTE — Telephone Encounter (Signed)
Scheduled per los. Called and left msg. Mailed printout  °

## 2020-05-28 ENCOUNTER — Telehealth: Payer: Self-pay | Admitting: Medical Oncology

## 2020-05-28 NOTE — Telephone Encounter (Signed)
LVM to return my call °

## 2020-05-29 ENCOUNTER — Telehealth: Payer: Self-pay | Admitting: Medical Oncology

## 2020-05-29 NOTE — Telephone Encounter (Signed)
LVM to return my call °

## 2020-08-01 ENCOUNTER — Other Ambulatory Visit: Payer: Self-pay | Admitting: Physician Assistant

## 2020-08-01 ENCOUNTER — Telehealth: Payer: Self-pay | Admitting: Physician Assistant

## 2020-08-01 DIAGNOSIS — C7800 Secondary malignant neoplasm of unspecified lung: Secondary | ICD-10-CM

## 2020-08-01 NOTE — Telephone Encounter (Signed)
Noticed that the patient had a follow-up appointment scheduled for 08/04/2020.  The purpose of this appointment is to review a restaging CT scan.  I noticed that the CT scan has not been done nor scheduled.  I left a voicemail on the patient's phone instructing him to call radiology scheduling and to schedule his scan at his earliest convenience.  Once the patient's appointment is scheduled with radiology, we will reschedule his follow-up appointment with our clinic to review the results.  Instructed the patient to call us back if he has any questions.

## 2020-08-04 ENCOUNTER — Other Ambulatory Visit: Payer: Self-pay

## 2020-08-04 ENCOUNTER — Inpatient Hospital Stay: Payer: Medicare Other

## 2020-08-04 ENCOUNTER — Encounter: Payer: Self-pay | Admitting: Physician Assistant

## 2020-08-04 ENCOUNTER — Inpatient Hospital Stay: Payer: Medicare Other | Attending: Internal Medicine | Admitting: Physician Assistant

## 2020-08-04 ENCOUNTER — Ambulatory Visit (HOSPITAL_COMMUNITY)
Admission: RE | Admit: 2020-08-04 | Discharge: 2020-08-04 | Disposition: A | Discharge status: Home / Self Care | Payer: Medicare Other | Source: Ambulatory Visit | Attending: Physician Assistant | Admitting: Physician Assistant

## 2020-08-04 ENCOUNTER — Other Ambulatory Visit: Payer: Medicare Other

## 2020-08-04 ENCOUNTER — Encounter (HOSPITAL_COMMUNITY): Payer: Self-pay

## 2020-08-04 VITALS — BP 124/80 | HR 74 | Temp 98.0°F | Resp 18 | Ht 68.0 in | Wt 164.8 lb

## 2020-08-04 DIAGNOSIS — J439 Emphysema, unspecified: Secondary | ICD-10-CM | POA: Insufficient documentation

## 2020-08-04 DIAGNOSIS — R490 Dysphonia: Secondary | ICD-10-CM

## 2020-08-04 DIAGNOSIS — I700 Atherosclerosis of aorta: Secondary | ICD-10-CM | POA: Diagnosis not present

## 2020-08-04 DIAGNOSIS — J984 Other disorders of lung: Secondary | ICD-10-CM | POA: Insufficient documentation

## 2020-08-04 DIAGNOSIS — Z85118 Personal history of other malignant neoplasm of bronchus and lung: Secondary | ICD-10-CM | POA: Diagnosis present

## 2020-08-04 DIAGNOSIS — I2510 Atherosclerotic heart disease of native coronary artery without angina pectoris: Secondary | ICD-10-CM | POA: Diagnosis not present

## 2020-08-04 DIAGNOSIS — I719 Aortic aneurysm of unspecified site, without rupture: Secondary | ICD-10-CM | POA: Diagnosis not present

## 2020-08-04 DIAGNOSIS — C7800 Secondary malignant neoplasm of unspecified lung: Secondary | ICD-10-CM | POA: Diagnosis not present

## 2020-08-04 LAB — POCT I-STAT CREATININE: Creatinine, Ser: 1.2 mg/dL (ref 0.61–1.24)

## 2020-08-04 MED ORDER — IOHEXOL 300 MG/ML  SOLN
100.0000 mL | Freq: Once | INTRAMUSCULAR | Status: AC | PRN
  Administered 2020-08-04: 100 mL via INTRAVENOUS

## 2020-08-04 NOTE — Progress Notes (Signed)
[facility] OFFICE PROGRESS NOTE  Pcp, No No address on file  DIAGNOSIS: Stage IV (T3, N0, M1 a) non-small cell lung cancer, squamous cell carcinoma presented with multiple bilateral pulmonary nodules diagnosed in April 2018.  PRIOR THERAPY: 1) Systemic chemotherapy with carboplatin for AUC of 5 and paclitaxel 175 MG/M2 every 3 weeks with Neulasta support. Status post 6 cycles. 2) Second line treatment with immunotherapy was Nivolumab 480 mg IV every 4 weeks. First cycle November 14, 2017. Status post28cycles. The patient opted to take a break from treatment.  CURRENT THERAPY: Observation   INTERVAL HISTORY: [name] [name] 69 y.o. male returns to the clinic today for a follow-up visit.  The patient is feeling fair today without any concerning complaints except for hoarseness which started a few weeks ago.  The patient has been on observation for the last 3 months per patient request and missing several appointments. He also reports a burning feeling in his chest sometimes. He does not take a PPI. The patient denies any fever, chills, night sweats, or weight loss.  The patient takes Restoril for insomnia.  He did denies any chest pain or hemoptysis. He reports a cough which produces phlegm. He reports his baseline dyspnea on exertion.  He denies any nausea, vomiting, or constipation.  He reports his baseline diarrhea for which he takes Imodium.  He denies any blood in the stool.  He denies any headache or visual changes.  The patient recently had a restaging CT scan of the chest, abdomen, and pelvis performed today.  The patient is here today for evaluation and to review the scan results.   MEDICAL HISTORY: Past Medical History:  Diagnosis Date  . Depression 01/29/2017  . Dyspnea   . Encounter for antineoplastic chemotherapy 01/29/2017  . Goals of care, counseling/discussion 01/29/2017  . Hypertension   . Metastatic squamous cell carcinoma to lung, unspecified laterality ([place])  01/28/2017  . Stroke [facility])     ALLERGIES:  has No Known Allergies.  MEDICATIONS:  Current Outpatient Medications  Medication Sig Dispense Refill  . acetaminophen (TYLENOL) 325 MG tablet You can take 2 every 4 hours as needed.  You can buy it over the counter DO NOT TAKE MORE THAN 4000 MG OF TYLENOL PER DAY.  IT CAN HARM YOUR LIVER.    Marland Kitchen amitriptyline (ELAVIL) 100 MG tablet Take 1 tablet (100 mg total) by mouth at bedtime. 30 tablet 3  . aspirin EC 81 MG tablet Take 81 mg by mouth daily.    Marland Kitchen gabapentin (NEURONTIN) 300 MG capsule TAKE 2 CAPSULES BY MOUTH THREE TIMES DAILY 90 capsule 0  . loperamide (IMODIUM) 2 MG capsule Take 1 capsule (2 mg total) by mouth 2 (two) times daily. Also available over the counter. You can take 1 extra dose in the afternoon if diarrhea is not improving. 60 capsule 4  . mesalamine (APRISO) 0.375 g 24 hr capsule Take 4 capsules by mouth daily. (Patient not taking: Reported on 05/01/2020)    . metoprolol succinate (TOPROL-XL) 25 MG 24 hr tablet Take 1 tablet (25 mg total) by mouth daily. 30 tablet 4  . mirtazapine (REMERON) 30 MG tablet TAKE 1 TABLET BY MOUTH AT BEDTIME 30 tablet 0  . nortriptyline (PAMELOR) 10 MG capsule Take 10 mg by mouth 2 (two) times daily.  (Patient not taking: Reported on 05/01/2020)    . pantoprazole (PROTONIX) 40 MG tablet Take 1 tablet (40 mg total) by mouth 2 (two) times daily. (Patient not taking:  Reported on 05/01/2020) 60 tablet 1  . simvastatin (ZOCOR) 20 MG tablet Take 20 mg by mouth daily.    . temazepam (RESTORIL) 30 MG capsule Take 1 capsule (30 mg total) by mouth at bedtime. 30 capsule 0   No current facility-administered medications for this visit.    SURGICAL HISTORY:  Past Surgical History:  Procedure Laterality Date  . BACK SURGERY     cyst removal  . COLONOSCOPY    . LAPAROTOMY N/A 02/25/2017   Procedure: EXPLORATORY LAPAROTOMY MODIFIED GRAHAM'S PATCH;  Surgeon: Kinsinger, Arta Bruce, MD;  Location: [facility];  Service:  General;  Laterality: N/A;  . MINOR PLACEMENT OF FIDUCIAL N/A 01/19/2017   Procedure: MINOR PLACEMENT OF FIDUCIAL x 6;  Surgeon: Collene Gobble, MD;  Location: [place];  Service: Thoracic;  Laterality: N/A;  . VIDEO BRONCHOSCOPY WITH ENDOBRONCHIAL NAVIGATION N/A 01/19/2017   Procedure: VIDEO BRONCHOSCOPY WITH ENDOBRONCHIAL NAVIGATION;  Surgeon: Collene Gobble, MD;  Location: [facility];  Service: Thoracic;  Laterality: N/A;    REVIEW OF SYSTEMS:   Review of Systems  Constitutional: Negative for appetite change, chills, fatigue, fever and unexpected weight change.  HENT: Positive for hoarseness. Negative for mouth sores, nosebleeds, sore throat and trouble swallowing.   Eyes: Negative for eye problems and icterus.  Respiratory: Positive for occasional productive cough. Negative for cough, hemoptysis, shortness of breath and wheezing.   Cardiovascular: Positive for burning in chest. Negative for leg swelling.  Gastrointestinal: Positive for chronic diarrhea. Negative for abdominal pain, constipation,  nausea and vomiting.  Genitourinary: Negative for bladder incontinence, difficulty urinating, dysuria, frequency and hematuria.   Musculoskeletal: Negative for back pain, gait problem, neck pain and neck stiffness.  Skin: Negative for itching and rash.  Neurological: Negative for dizziness, extremity weakness, gait problem, headaches, light-headedness and seizures.  Hematological: Negative for adenopathy. Does not bruise/bleed easily.  Psychiatric/Behavioral: Negative for confusion, depression and sleep disturbance. The patient is not nervous/anxious.     PHYSICAL EXAMINATION:  Blood pressure 124/80, pulse 74, temperature 98 F (36.7 C), temperature source Tympanic, resp. rate 18, height 5\' 8"  (1.727 m), weight 164 lb 12.8 oz (74.8 kg), SpO2 97 %.  ECOG PERFORMANCE STATUS: 1 - Symptomatic but completely ambulatory  Physical Exam  Constitutional: Oriented to person, place, and time and well-developed,  well-nourished, and in no distress.  HENT:  Head: Normocephalic and atraumatic.  Mouth/Throat: Oropharynx is clear and moist. No oropharyngeal exudate.  Eyes: Conjunctivae are normal. Right eye exhibits no discharge. Left eye exhibits no discharge. No scleral icterus.  Neck: Normal range of motion. Neck supple.  Cardiovascular: Normal rate, regular rhythm, normal heart sounds and intact distal pulses.   Pulmonary/Chest: Effort normal. Quiet breath sounds in all lung fields. No respiratory distress. No wheezes. No rales.  Abdominal: Soft. Bowel sounds are normal. Exhibits no distension and no mass. There is no tenderness.  Musculoskeletal: Normal range of motion. Exhibits no edema.  Lymphadenopathy:    No cervical adenopathy.  Neurological: Alert and oriented to person, place, and time. Exhibits normal muscle tone. Gait normal. Coordination normal.  Skin: Skin is warm and dry. No rash noted. Not diaphoretic. No erythema. No pallor.  Psychiatric: Mood, memory and judgment normal.  Vitals reviewed.  LABORATORY DATA: Lab Results  Component Value Date   WBC 7.3 05/01/2020   HGB 13.5 05/01/2020   HCT 41.3 05/01/2020   MCV 90.8 05/01/2020   PLT 224 05/01/2020      Chemistry      Component  Value Date/Time   NA 141 05/01/2020 1314   NA 139 10/06/2017 1052   K 4.4 05/01/2020 1314   K 4.2 10/06/2017 1052   CL 105 05/01/2020 1314   CO2 27 05/01/2020 1314   CO2 25 10/06/2017 1052   BUN 12 05/01/2020 1314   BUN 13.2 10/06/2017 1052   CREATININE 1.20 08/04/2020 1356   CREATININE 0.83 05/01/2020 1314   CREATININE 0.9 10/06/2017 1052      Component Value Date/Time   CALCIUM 8.9 05/01/2020 1314   CALCIUM 9.5 10/06/2017 1052   ALKPHOS 68 05/01/2020 1314   ALKPHOS 68 10/06/2017 1052   AST 13 (L) 05/01/2020 1314   AST 14 10/06/2017 1052   ALT 10 05/01/2020 1314   ALT 13 10/06/2017 1052   BILITOT 0.4 05/01/2020 1314   BILITOT 0.44 10/06/2017 1052       RADIOGRAPHIC STUDIES:  No  results found.   ASSESSMENT/PLAN:  This is a very pleasant 69 year old Caucasian male with stage IV non-small cell lung cancer, squamous cell carcinoma. He presented with multiple bilateral pulmonary nodules. He was diagnosed in April 2018.  He underwent systemic chemotherapy with carboplatin and paclitaxel. He is status post 6 cycles. He tolerated it well except for the development of persistent peripheral neuropathy.  He was then on observation for 3 months. He showed signs of disease progression.  The patient was then treated with 28 cycles of immunotherapy with nivolumab 480 mg IV every 4 weeks.  This has been on hold due to patient request for a break.  The patient recently had a restaging CT scan of chest, abdomen, and pelvis.  The scan has not been over read by radiology at this point in time.  Dr. Julien Nordmann personally and independently reviewed the scan and discussed the results with the patient today.  The scan did not show any clear evidence for disease progression.  Dr. Julien Nordmann recommends that the patient continue on observation with a repeat CT scan of the chest, abdomen, and pelvis in 3 months, unless there are any new or concerning findings found on the radiology over read.  We will see the patient back for follow-up visit in 3 months for evaluation for a repeat CT scan of the chest, abdomen, and pelvis.  We have referred him to ENT for his hoarseness.   Regarding the burning in his chest, I advised the patient to trial out PPIs.   The patient was advised to call immediately if he has any concerning symptoms in the interval. The patient voices understanding of current disease status and treatment options and is in agreement with the current care plan. All questions were answered. The patient knows to call the clinic with any problems, questions or concerns. We can certainly see the patient much sooner if necessary      Orders Placed This Encounter  Procedures  . CT  Chest W Contrast    Standing Status:   Future    Standing Expiration Date:   08/04/2021    Order Specific Question:   If indicated for the ordered procedure, I authorize the administration of contrast media per Radiology protocol    Answer:   Yes    Order Specific Question:   Preferred imaging location?    Answer:   [facility]  . CT Abdomen Pelvis W Contrast    Standing Status:   Future    Standing Expiration Date:   08/04/2021    Order Specific Question:   If indicated for the  ordered procedure, I authorize the administration of contrast media per Radiology protocol    Answer:   Yes    Order Specific Question:   Preferred imaging location?    Answer:   [facility]    Order Specific Question:   Is Oral Contrast requested for this exam?    Answer:   Yes, Per Radiology protocol  . CMP ([facility] only)    Standing Status:   Future    Standing Expiration Date:   08/04/2021  . CBC with Differential (Cancer Center Only)    Standing Status:   Future    Standing Expiration Date:   08/04/2021  . Ambulatory referral to ENT    Referral Priority:   Routine    Referral Type:   Consultation    Referral Reason:   Specialty Services Required    Requested Specialty:   Otolaryngology    Number of Visits Requested:   [place], PA-C 08/04/20   ADDENDUM: Hematology/Oncology Attending: I had a face-to-face encounter with the patient today.  I recommended his care plan.  This is a very pleasant 69 years old white male with metastatic non-small cell lung cancer, squamous cell carcinoma status post systemic chemotherapy with carboplatin and paclitaxel for 6 cycles followed by observation.  The patient had disease progression and he started on second line treatment with immunotherapy with nivolumab every 4 weeks status post 28 cycles.  The patient was complaining of increasing fatigue and weakness and he requested a break from treatment.  He has been in  observation for the last 3 months.  He is feeling fine except for hoarseness of his voice. He had repeat CT scan of the chest, abdomen pelvis performed earlier today.  I personally and independently reviewed the scan images and discussed the results with the patient today. His scan showed no concerning findings for disease progression. I recommended for him to continue on observation with repeat CT scan of the chest, abdomen pelvis in 3 months. For the hoarseness of his voice, we will refer the patient to ENT for evaluation. The patient was advised to call immediately if he has any other concerning symptoms in the interval.  Disclaimer: This note was dictated with voice recognition software. Similar sounding words can inadvertently be transcribed and may be missed upon review. Eilleen Kempf, MD 08/04/20

## 2020-08-05 ENCOUNTER — Telehealth: Payer: Self-pay | Admitting: Physician Assistant

## 2020-08-05 NOTE — Telephone Encounter (Signed)
I called the patient to let him know that everything looked stable on his scan. We will see him in 3 months with a repeat scan as planned. He expressed understanding.

## 2020-08-08 ENCOUNTER — Telehealth: Payer: Self-pay

## 2020-08-08 NOTE — Telephone Encounter (Signed)
-----   Message from [employer], PA-C sent at 08/07/2020 10:25 AM EDT ----- Regarding: FW: Referral Can you look at ENTs in [place] ,[place] and pick a practice and message them back to complete this referral? Thanks.  ----- Message ----- From: Jim Like Sent: 08/05/2020  12:52 PM EDT To: Tobe Sos Heilingoetter, PA-C, [facility] Subject: RE: Referral                                   Hello,  I called Dr.Newman office and confirmed referral was received. Received a CB from rep stating pt advised he would rather go to a facility closer to home in [place]. Please let me know what facility you would like pt referred to along with contact information and I will be happy to send referral over.  Thank you! ----- Message ----- From: Heilingoetter, Cassandra Carlean Jews, PA-C Sent: 08/04/2020   4:16 PM EDT To: Cipriano Bunker [facility] Subject: Referral                                       Good Afternoon,   I have placed a referral to Dr. Radene Journey at [address], [place], [place]  who is an ENT (otolaryngology) for the complaint of hoarseness. I just was reaching out to make sure his records get transferred. Thanks!  -Cassie PA-C

## 2020-08-08 NOTE — Telephone Encounter (Signed)
The nearest ENT to [place], [place] is in [place]. [place], [place]. Practice is called [facility] at [place]. [address], [place], [place] [address]. [place]: [phone], [place]: [phone].  I attempted to call the pt to advise of this. He answered and advised he was busy and call back later. I was unable to relay this information to him for this reason.

## 2020-08-11 ENCOUNTER — Other Ambulatory Visit: Payer: Self-pay | Admitting: Physician Assistant

## 2020-08-11 DIAGNOSIS — C7800 Secondary malignant neoplasm of unspecified lung: Secondary | ICD-10-CM

## 2020-08-13 ENCOUNTER — Other Ambulatory Visit: Payer: Self-pay | Admitting: Physician Assistant

## 2020-08-13 DIAGNOSIS — R490 Dysphonia: Secondary | ICD-10-CM

## 2020-08-15 ENCOUNTER — Telehealth: Payer: Self-pay | Admitting: Internal Medicine

## 2020-08-15 NOTE — Telephone Encounter (Signed)
Released records to [facility] ent to [phone]  Release:  [phone]

## 2020-08-26 ENCOUNTER — Telehealth: Payer: Self-pay | Admitting: Medical Oncology

## 2020-08-26 NOTE — Telephone Encounter (Signed)
[facility] Pih called and said [name] [name] left voice mail concerned about [place].Brehavior has changed.  Symptoms- "he will not eat or drink Protein drinks. His demeanor has changed . He  threw a full coffee cup at her. He is hateful. His throat hurts.

## 2020-08-26 NOTE — Telephone Encounter (Signed)
I spoke to pt and he answered my questions appropriately. He reports his usual PN  I told him he will have labs and scans and office visit in 3 months.

## 2020-09-04 ENCOUNTER — Telehealth: Payer: Self-pay | Admitting: Medical Oncology

## 2020-09-04 NOTE — Telephone Encounter (Signed)
Pt needs to get Barium for scan in jan. Unable to leave a message.

## 2020-09-05 ENCOUNTER — Other Ambulatory Visit: Payer: Self-pay | Admitting: Medical Oncology

## 2020-09-05 ENCOUNTER — Telehealth: Payer: Self-pay | Admitting: Medical Oncology

## 2020-09-05 NOTE — Telephone Encounter (Signed)
Can pt pick up barium at his office? Faxed request to Dr Johney Frame -PCP.

## 2020-09-05 NOTE — Telephone Encounter (Signed)
Barium sullfate suspension Dr Johney Frame or [facility] hospital cannot give pt their Barium for a scan at [facility] . Pt aware. "I have some time to figure out how to get the barium -it is a 2 hour drive for me" I offered for him to come  in 2 hours early to drink water based substitute  and he declined .

## 2020-09-25 ENCOUNTER — Telehealth: Payer: Self-pay | Admitting: Internal Medicine

## 2020-09-25 NOTE — Telephone Encounter (Signed)
Scheduled appt per 12/8 sch message- no answer a vmail full. Mailed letter with appt date and time

## 2020-09-26 ENCOUNTER — Telehealth: Payer: Self-pay

## 2020-09-26 NOTE — Telephone Encounter (Signed)
Pt LM indicating he was returning a call.  I called pt back and advised the scheduling department was calling to advising of his 10/29/19 lab/OV. He has been advised his arrival time is 2:45pm. He expressed understanding of this information.

## 2020-10-03 ENCOUNTER — Telehealth: Payer: Self-pay

## 2020-10-03 NOTE — Telephone Encounter (Signed)
Pt called wanting to schedule his CT Scan. I transferred the pt to Centralized Scheduling as he said he was driving and could not take the phone number.

## 2020-10-16 ENCOUNTER — Ambulatory Visit (HOSPITAL_COMMUNITY): Payer: Medicare Other

## 2020-10-16 ENCOUNTER — Other Ambulatory Visit: Payer: Medicare Other

## 2020-10-17 ENCOUNTER — Ambulatory Visit (HOSPITAL_COMMUNITY)
Admission: RE | Admit: 2020-10-17 | Discharge: 2020-10-17 | Disposition: A | Discharge status: Home / Self Care | Payer: Medicare Other | Source: Ambulatory Visit | Attending: Physician Assistant | Admitting: Physician Assistant

## 2020-10-17 ENCOUNTER — Encounter (HOSPITAL_COMMUNITY): Payer: Self-pay

## 2020-10-17 ENCOUNTER — Other Ambulatory Visit: Payer: Self-pay

## 2020-10-17 DIAGNOSIS — I714 Abdominal aortic aneurysm, without rupture: Secondary | ICD-10-CM | POA: Insufficient documentation

## 2020-10-17 DIAGNOSIS — C7800 Secondary malignant neoplasm of unspecified lung: Secondary | ICD-10-CM

## 2020-10-17 DIAGNOSIS — I723 Aneurysm of iliac artery: Secondary | ICD-10-CM | POA: Diagnosis not present

## 2020-10-17 DIAGNOSIS — I700 Atherosclerosis of aorta: Secondary | ICD-10-CM | POA: Diagnosis not present

## 2020-10-17 DIAGNOSIS — J432 Centrilobular emphysema: Secondary | ICD-10-CM | POA: Diagnosis not present

## 2020-10-17 DIAGNOSIS — I2510 Atherosclerotic heart disease of native coronary artery without angina pectoris: Secondary | ICD-10-CM | POA: Insufficient documentation

## 2020-10-17 DIAGNOSIS — K5730 Diverticulosis of large intestine without perforation or abscess without bleeding: Secondary | ICD-10-CM | POA: Diagnosis not present

## 2020-10-17 LAB — POCT I-STAT CREATININE: Creatinine, Ser: 1 mg/dL (ref 0.61–1.24)

## 2020-10-17 MED ORDER — IOHEXOL 300 MG/ML  SOLN
100.0000 mL | Freq: Once | INTRAMUSCULAR | Status: AC | PRN
  Administered 2020-10-17: 100 mL via INTRAVENOUS

## 2020-10-22 ENCOUNTER — Telehealth: Payer: Self-pay | Admitting: Internal Medicine

## 2020-10-22 NOTE — Telephone Encounter (Signed)
Rescheduled appointments per 1/5 sch msg. Spoke to patient who is aware of updated appointments date and times.

## 2020-10-27 ENCOUNTER — Other Ambulatory Visit (HOSPITAL_COMMUNITY): Payer: Medicare Other

## 2020-10-27 ENCOUNTER — Other Ambulatory Visit: Payer: Medicare Other

## 2020-10-28 ENCOUNTER — Ambulatory Visit: Payer: Medicare Other | Admitting: Internal Medicine

## 2020-10-28 ENCOUNTER — Inpatient Hospital Stay (HOSPITAL_BASED_OUTPATIENT_CLINIC_OR_DEPARTMENT_OTHER): Payer: Medicare Other | Admitting: Internal Medicine

## 2020-10-28 ENCOUNTER — Other Ambulatory Visit: Payer: Self-pay

## 2020-10-28 ENCOUNTER — Other Ambulatory Visit: Payer: Medicare Other

## 2020-10-28 ENCOUNTER — Inpatient Hospital Stay: Payer: Medicare Other | Attending: Internal Medicine

## 2020-10-28 VITALS — BP 149/69 | HR 69 | Temp 97.8°F | Resp 16 | Ht 68.0 in | Wt 164.0 lb

## 2020-10-28 DIAGNOSIS — C3490 Malignant neoplasm of unspecified part of unspecified bronchus or lung: Secondary | ICD-10-CM | POA: Diagnosis not present

## 2020-10-28 DIAGNOSIS — G629 Polyneuropathy, unspecified: Secondary | ICD-10-CM | POA: Diagnosis not present

## 2020-10-28 DIAGNOSIS — Z85118 Personal history of other malignant neoplasm of bronchus and lung: Secondary | ICD-10-CM | POA: Insufficient documentation

## 2020-10-28 DIAGNOSIS — C7800 Secondary malignant neoplasm of unspecified lung: Secondary | ICD-10-CM

## 2020-10-28 LAB — CBC WITH DIFFERENTIAL (CANCER CENTER ONLY)
Abs Immature Granulocytes: 0.01 10*3/uL (ref 0.00–0.07)
Basophils Absolute: 0.1 10*3/uL (ref 0.0–0.1)
Basophils Relative: 1 %
Eosinophils Absolute: 0.2 10*3/uL (ref 0.0–0.5)
Eosinophils Relative: 2 %
HCT: 39.9 % (ref 39.0–52.0)
Hemoglobin: 13 g/dL (ref 13.0–17.0)
Immature Granulocytes: 0 %
Lymphocytes Relative: 34 %
Lymphs Abs: 2.5 10*3/uL (ref 0.7–4.0)
MCH: 29.7 pg (ref 26.0–34.0)
MCHC: 32.6 g/dL (ref 30.0–36.0)
MCV: 91.3 fL (ref 80.0–100.0)
Monocytes Absolute: 0.5 10*3/uL (ref 0.1–1.0)
Monocytes Relative: 7 %
Neutro Abs: 4.1 10*3/uL (ref 1.7–7.7)
Neutrophils Relative %: 56 %
Platelet Count: 221 10*3/uL (ref 150–400)
RBC: 4.37 MIL/uL (ref 4.22–5.81)
RDW: 13.3 % (ref 11.5–15.5)
WBC Count: 7.3 10*3/uL (ref 4.0–10.5)
nRBC: 0 % (ref 0.0–0.2)

## 2020-10-28 LAB — CMP (CANCER CENTER ONLY)
ALT: 6 U/L (ref 0–44)
AST: 10 U/L — ABNORMAL LOW (ref 15–41)
Albumin: 3.6 g/dL (ref 3.5–5.0)
Alkaline Phosphatase: 65 U/L (ref 38–126)
Anion gap: 5 (ref 5–15)
BUN: 12 mg/dL (ref 8–23)
CO2: 31 mmol/L (ref 22–32)
Calcium: 8.9 mg/dL (ref 8.9–10.3)
Chloride: 103 mmol/L (ref 98–111)
Creatinine: 0.95 mg/dL (ref 0.61–1.24)
GFR, Estimated: >60 mL/min (ref >60)
Glucose, Bld: 96 mg/dL (ref 70–99)
Potassium: 4.2 mmol/L (ref 3.5–5.1)
Sodium: 139 mmol/L (ref 135–145)
Total Bilirubin: 0.5 mg/dL (ref 0.3–1.2)
Total Protein: 6.9 g/dL (ref 6.5–8.1)

## 2020-10-28 NOTE — Progress Notes (Signed)
[name] [name] [phone]   Fax:(336) [phone]  OFFICE PROGRESS NOTE  Laws, [name] Chamber, MD [address] [place] [address]  DIAGNOSIS: Stage IV (T3, N0, M1 a) non-small cell lung cancer, squamous cell carcinoma presented with multiple bilateral pulmonary nodules diagnosed in April 2018.  PRIOR THERAPY:   1) Systemic chemotherapy with carboplatin for AUC of 5 and paclitaxel 175 MG/M2 every 3 weeks with Neulasta support. Status post 6 cycles. 2) Second line treatment with immunotherapy with Nivolumab 480 mg IV every 4 weeks.  First cycle November 14, 2017.  Status post 23 cycles.  CURRENT THERAPY: Observation.  INTERVAL HISTORY: [name] [name] 70 y.o. male returns to the clinic today for follow-up visit.  The patient is feeling fine today with no concerning complaints.  He denied having any current chest pain, shortness of breath, cough or hemoptysis.  He denied having any fever or chills.  He has no nausea, vomiting, diarrhea or constipation.  He continues to have mild peripheral neuropathy.  The patient has no recent weight loss or night sweats.  He had repeat CT scan of the chest, abdomen pelvis performed recently and he is here for evaluation and discussion of his scan results.  MEDICAL HISTORY: Past Medical History:  Diagnosis Date  . Depression 01/29/2017  . Dyspnea   . Encounter for antineoplastic chemotherapy 01/29/2017  . Goals of care, counseling/discussion 01/29/2017  . Hypertension   . Metastatic squamous cell carcinoma to lung, unspecified laterality ([place]) 01/28/2017  . Stroke [facility])     ALLERGIES:  has No Known Allergies.  MEDICATIONS:  Current Outpatient Medications  Medication Sig Dispense Refill  . acetaminophen (TYLENOL) 325 MG tablet You can take 2 every 4 hours as needed.  You can buy it over the counter DO NOT TAKE MORE THAN 4000 MG OF TYLENOL PER DAY.  IT CAN HARM YOUR LIVER.    Marland Kitchen amitriptyline (ELAVIL) 100 MG tablet Take 1 tablet  (100 mg total) by mouth at bedtime. 30 tablet 3  . aspirin EC 81 MG tablet Take 81 mg by mouth daily.    Marland Kitchen gabapentin (NEURONTIN) 300 MG capsule TAKE 2 CAPSULES BY MOUTH THREE TIMES DAILY 90 capsule 0  . loperamide (IMODIUM) 2 MG capsule Take 1 capsule (2 mg total) by mouth 2 (two) times daily. Also available over the counter. You can take 1 extra dose in the afternoon if diarrhea is not improving. 60 capsule 4  . mesalamine (APRISO) 0.375 g 24 hr capsule Take 4 capsules by mouth daily. (Patient not taking: Reported on 05/01/2020)    . metoprolol succinate (TOPROL-XL) 25 MG 24 hr tablet Take 1 tablet (25 mg total) by mouth daily. 30 tablet 4  . mirtazapine (REMERON) 30 MG tablet TAKE 1 TABLET BY MOUTH AT BEDTIME 30 tablet 0  . nortriptyline (PAMELOR) 10 MG capsule Take 10 mg by mouth 2 (two) times daily.  (Patient not taking: Reported on 05/01/2020)    . pantoprazole (PROTONIX) 40 MG tablet Take 1 tablet (40 mg total) by mouth 2 (two) times daily. (Patient not taking: Reported on 05/01/2020) 60 tablet 1  . simvastatin (ZOCOR) 20 MG tablet Take 20 mg by mouth daily.    . temazepam (RESTORIL) 30 MG capsule Take 1 capsule (30 mg total) by mouth at bedtime. 30 capsule 0   No current facility-administered medications for this visit.    SURGICAL HISTORY:  Past Surgical History:  Procedure Laterality Date  .  BACK SURGERY     cyst removal  . COLONOSCOPY    . LAPAROTOMY N/A 02/25/2017   Procedure: EXPLORATORY LAPAROTOMY MODIFIED GRAHAM'S PATCH;  Surgeon: Kinsinger, Arta Bruce, MD;  Location: [facility];  Service: General;  Laterality: N/A;  . MINOR PLACEMENT OF FIDUCIAL N/A 01/19/2017   Procedure: MINOR PLACEMENT OF FIDUCIAL x 6;  Surgeon: Collene Gobble, MD;  Location: [facility];  Service: Thoracic;  Laterality: N/A;  . VIDEO BRONCHOSCOPY WITH ENDOBRONCHIAL NAVIGATION N/A 01/19/2017   Procedure: VIDEO BRONCHOSCOPY WITH ENDOBRONCHIAL NAVIGATION;  Surgeon: Collene Gobble, MD;  Location: [facility];  Service: Thoracic;   Laterality: N/A;    REVIEW OF SYSTEMS:  A comprehensive review of systems was negative except for: Neurological: positive for paresthesia   PHYSICAL EXAMINATION: General appearance: alert, cooperative and no distress Head: Normocephalic, without obvious abnormality, atraumatic Neck: no adenopathy, no JVD, supple, symmetrical, trachea midline and thyroid not enlarged, symmetric, no tenderness/mass/nodules Lymph nodes: Cervical, supraclavicular, and axillary nodes normal. Resp: clear to auscultation bilaterally Back: symmetric, no curvature. ROM normal. No CVA tenderness. Cardio: regular rate and rhythm, S1, S2 normal, no murmur, click, rub or gallop GI: soft, non-tender; bowel sounds normal; no masses,  no organomegaly Extremities: extremities normal, atraumatic, no cyanosis or edema  ECOG PERFORMANCE STATUS: 1 - Symptomatic but completely ambulatory   Blood pressure (!) 149/69, pulse 69, temperature 97.8 F (36.6 C), temperature source Tympanic, resp. rate 16, height 5\' 8"  (1.727 m), weight 164 lb (74.4 kg), SpO2 98 %.  LABORATORY DATA: Lab Results  Component Value Date   WBC 7.3 10/28/2020   HGB 13.0 10/28/2020   HCT 39.9 10/28/2020   MCV 91.3 10/28/2020   PLT 221 10/28/2020      Chemistry      Component Value Date/Time   NA 139 10/28/2020 1011   NA 139 10/06/2017 1052   K 4.2 10/28/2020 1011   K 4.2 10/06/2017 1052   CL 103 10/28/2020 1011   CO2 31 10/28/2020 1011   CO2 25 10/06/2017 1052   BUN 12 10/28/2020 1011   BUN 13.2 10/06/2017 1052   CREATININE 0.95 10/28/2020 1011   CREATININE 0.9 10/06/2017 1052      Component Value Date/Time   CALCIUM 8.9 10/28/2020 1011   CALCIUM 9.5 10/06/2017 1052   ALKPHOS 65 10/28/2020 1011   ALKPHOS 68 10/06/2017 1052   AST 10 (L) 10/28/2020 1011   AST 14 10/06/2017 1052   ALT 6 10/28/2020 1011   ALT 13 10/06/2017 1052   BILITOT 0.5 10/28/2020 1011   BILITOT 0.44 10/06/2017 1052       RADIOGRAPHIC STUDIES: CT Chest W  Contrast  Result Date: 10/17/2020 CLINICAL DATA:  70 year old male with history of non-small cell lung cancer. Weight loss. EXAM: CT CHEST, ABDOMEN, AND PELVIS WITH CONTRAST TECHNIQUE: Multidetector CT imaging of the chest, abdomen and pelvis was performed following the standard protocol during bolus administration of intravenous contrast. CONTRAST:  156mL OMNIPAQUE IOHEXOL 300 MG/ML  SOLN COMPARISON:  CT of the chest, abdomen and pelvis. FINDINGS: CT CHEST FINDINGS Cardiovascular: Heart size is normal. There is no significant pericardial fluid, thickening or pericardial calcification. There is aortic atherosclerosis, as well as atherosclerosis of the great vessels of the mediastinum and the coronary arteries, including calcified atherosclerotic plaque in the left anterior descending and right coronary arteries. Ectasia of ascending thoracic aorta measuring up to 4.3 cm in diameter. Mediastinum/Nodes: No pathologically enlarged mediastinal or hilar lymph nodes. Esophagus is unremarkable in appearance. No axillary  lymphadenopathy. Lungs/Pleura: Fiducial markers in the left upper lobe with surrounding postradiation changes, similar to prior examinations. Small fiducial marker in the right upper lobe also noted. A few scattered small pulmonary nodules appear generally stable in size and number compared to prior examinations, largest of which is in the right upper lobe (axial image 49 of series 6) measuring 9 x 8 mm. Mild diffuse bronchial wall thickening with mild centrilobular and paraseptal emphysema. No acute consolidative airspace disease. No pleural effusions. Musculoskeletal: Healing nondisplaced fractures of the lateral aspect of the left fourth and fifth ribs, similar to the prior study. Old healed fracture of the posterior aspect of the left ninth rib similar to the prior study, including a central area of lucency within it (axial image 109 of series 6) there are no aggressive appearing lytic or blastic  lesions noted in the visualized portions of the skeleton. CT ABDOMEN PELVIS FINDINGS Hepatobiliary: No suspicious cystic or solid hepatic lesions. No intra or extrahepatic biliary ductal dilatation. Gallbladder is normal in appearance. Pancreas: No pancreatic mass. No pancreatic ductal dilatation. No pancreatic or peripancreatic fluid collections or inflammatory changes. Spleen: Unremarkable. Adrenals/Urinary Tract: Nodular adreniform thickening of the left adrenal gland, stable compared to prior examinations. Right adrenal gland is normal in appearance. Mild scarring in the upper pole of the right kidney posteriorly. Left kidney is normal in appearance. No hydroureteronephrosis. Urinary bladder wall is thickened and irregular. Stomach/Bowel: The appearance of the stomach is normal. There is no pathologic dilatation of small bowel or colon. Numerous colonic diverticulae are noted, without surrounding inflammatory changes to suggest an acute diverticulitis at this time. The appendix is not confidently identified and may be surgically absent. Regardless, there are no inflammatory changes noted adjacent to the cecum to suggest the presence of an acute appendicitis at this time. Vascular/Lymphatic: Aortic atherosclerosis with fusiform aneurysmal dilatation of the infrarenal abdominal aorta which measures up to 3.6 x 3.2 cm in diameter, as well as aneurysmal dilatation of the common iliac arteries measuring 1.9 cm in diameter on the right and 1.9 cm in diameter on the left. No lymphadenopathy noted in the abdomen or pelvis. Reproductive: Prostate gland and seminal vesicles are unremarkable in appearance. Other: No significant volume of ascites.  No pneumoperitoneum. Musculoskeletal: There are no aggressive appearing lytic or blastic lesions noted in the visualized portions of the skeleton. IMPRESSION: 1. Stable postradiation changes in the left upper lobe with similar number and size of small pulmonary nodules scattered  throughout the lungs bilaterally. No definitive evidence of local recurrence of disease in the chest and no definite signs of metastatic disease in the abdomen or pelvis. 2. Mild diffuse bronchial wall thickening with mild centrilobular and paraseptal emphysema; imaging findings suggestive of underlying COPD. 3. Aortic atherosclerosis, in addition to two vessel coronary artery disease. There is also ectasia of the ascending thoracic aorta (4.3 cm in diameter). Recommend annual imaging followup by CTA or MRA. This recommendation follows 2010 ACCF/AHA/AATS/ACR/ASA/SCA/SCAI/SIR/STS/SVM Guidelines for the Diagnosis and Management of Patients with Thoracic Aortic Disease. Circulation. 2010; 121: J696-V893. Aortic aneurysm NOS (ICD10-I71.9). 4. Abdominal aortic aneurysm measuring up to 3.6 x 3.2 cm in diameter, as well as common iliac artery aneurysms bilaterally (1.9 cm in diameter). Recommend follow-up ultrasound every 2 years. This recommendation follows ACR consensus guidelines: White Paper of the ACR Incidental Findings Committee II on Vascular Findings. J Am Coll Radiol 2013; 10:789-794. 5. Colonic diverticulosis without evidence of acute diverticulitis at this time. 6. Additional incidental findings, as above. Electronically  Signed   By: Vinnie Langton M.D.   On: 10/17/2020 13:16   CT Abdomen Pelvis W Contrast  Result Date: 10/17/2020 CLINICAL DATA:  70 year old male with history of non-small cell lung cancer. Weight loss. EXAM: CT CHEST, ABDOMEN, AND PELVIS WITH CONTRAST TECHNIQUE: Multidetector CT imaging of the chest, abdomen and pelvis was performed following the standard protocol during bolus administration of intravenous contrast. CONTRAST:  168mL OMNIPAQUE IOHEXOL 300 MG/ML  SOLN COMPARISON:  CT of the chest, abdomen and pelvis. FINDINGS: CT CHEST FINDINGS Cardiovascular: Heart size is normal. There is no significant pericardial fluid, thickening or pericardial calcification. There is aortic  atherosclerosis, as well as atherosclerosis of the great vessels of the mediastinum and the coronary arteries, including calcified atherosclerotic plaque in the left anterior descending and right coronary arteries. Ectasia of ascending thoracic aorta measuring up to 4.3 cm in diameter. Mediastinum/Nodes: No pathologically enlarged mediastinal or hilar lymph nodes. Esophagus is unremarkable in appearance. No axillary lymphadenopathy. Lungs/Pleura: Fiducial markers in the left upper lobe with surrounding postradiation changes, similar to prior examinations. Small fiducial marker in the right upper lobe also noted. A few scattered small pulmonary nodules appear generally stable in size and number compared to prior examinations, largest of which is in the right upper lobe (axial image 49 of series 6) measuring 9 x 8 mm. Mild diffuse bronchial wall thickening with mild centrilobular and paraseptal emphysema. No acute consolidative airspace disease. No pleural effusions. Musculoskeletal: Healing nondisplaced fractures of the lateral aspect of the left fourth and fifth ribs, similar to the prior study. Old healed fracture of the posterior aspect of the left ninth rib similar to the prior study, including a central area of lucency within it (axial image 109 of series 6) there are no aggressive appearing lytic or blastic lesions noted in the visualized portions of the skeleton. CT ABDOMEN PELVIS FINDINGS Hepatobiliary: No suspicious cystic or solid hepatic lesions. No intra or extrahepatic biliary ductal dilatation. Gallbladder is normal in appearance. Pancreas: No pancreatic mass. No pancreatic ductal dilatation. No pancreatic or peripancreatic fluid collections or inflammatory changes. Spleen: Unremarkable. Adrenals/Urinary Tract: Nodular adreniform thickening of the left adrenal gland, stable compared to prior examinations. Right adrenal gland is normal in appearance. Mild scarring in the upper pole of the right kidney  posteriorly. Left kidney is normal in appearance. No hydroureteronephrosis. Urinary bladder wall is thickened and irregular. Stomach/Bowel: The appearance of the stomach is normal. There is no pathologic dilatation of small bowel or colon. Numerous colonic diverticulae are noted, without surrounding inflammatory changes to suggest an acute diverticulitis at this time. The appendix is not confidently identified and may be surgically absent. Regardless, there are no inflammatory changes noted adjacent to the cecum to suggest the presence of an acute appendicitis at this time. Vascular/Lymphatic: Aortic atherosclerosis with fusiform aneurysmal dilatation of the infrarenal abdominal aorta which measures up to 3.6 x 3.2 cm in diameter, as well as aneurysmal dilatation of the common iliac arteries measuring 1.9 cm in diameter on the right and 1.9 cm in diameter on the left. No lymphadenopathy noted in the abdomen or pelvis. Reproductive: Prostate gland and seminal vesicles are unremarkable in appearance. Other: No significant volume of ascites.  No pneumoperitoneum. Musculoskeletal: There are no aggressive appearing lytic or blastic lesions noted in the visualized portions of the skeleton. IMPRESSION: 1. Stable postradiation changes in the left upper lobe with similar number and size of small pulmonary nodules scattered throughout the lungs bilaterally. No definitive evidence of local recurrence  of disease in the chest and no definite signs of metastatic disease in the abdomen or pelvis. 2. Mild diffuse bronchial wall thickening with mild centrilobular and paraseptal emphysema; imaging findings suggestive of underlying COPD. 3. Aortic atherosclerosis, in addition to two vessel coronary artery disease. There is also ectasia of the ascending thoracic aorta (4.3 cm in diameter). Recommend annual imaging followup by CTA or MRA. This recommendation follows 2010 ACCF/AHA/AATS/ACR/ASA/SCA/SCAI/SIR/STS/SVM Guidelines for the  Diagnosis and Management of Patients with Thoracic Aortic Disease. Circulation. 2010; 121: W620-B559. Aortic aneurysm NOS (ICD10-I71.9). 4. Abdominal aortic aneurysm measuring up to 3.6 x 3.2 cm in diameter, as well as common iliac artery aneurysms bilaterally (1.9 cm in diameter). Recommend follow-up ultrasound every 2 years. This recommendation follows ACR consensus guidelines: White Paper of the ACR Incidental Findings Committee II on Vascular Findings. J Am Coll Radiol 2013; 10:789-794. 5. Colonic diverticulosis without evidence of acute diverticulitis at this time. 6. Additional incidental findings, as above. Electronically Signed   By: Vinnie Langton M.D.   On: 10/17/2020 13:16    ASSESSMENT AND PLAN:  This is a very pleasant 70 years old white male with stage IV non-small cell lung cancer, squamous cell carcinoma presented with multiple bilateral pulmonary nodules. The patient was treated with systemic chemotherapy with carboplatin and paclitaxel status post 6 cycles. He tolerated the last cycle of his treatment well except for the persistent peripheral neuropathy. The patient has been in observation for the last 3 months. He had a repeat CT scan of the chest, abdomen and pelvis performed recently.  Has a scan showed mild interval enlargement of left upper lobe as well as right upper lobe pulmonary nodules concerning for disease progression. The patient was started on treatment with immunotherapy with Nivolumab 480 mg IV every 4 weeks status post 28 cycles.  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  He has been in observation now for more than 6 months.  The patient is feeling fine with no concerning complaints. He had repeat CT scan of the chest, abdomen pelvis performed recently.  I personally and independently reviewed the scan and discussed the results with the patient. His scan showed no concerning findings for disease progression. I  recommended for him to continue on observation with repeat CT scan of the chest, abdomen pelvis in 4 months. For the peripheral neuropathy he will continue his current treatment with Neurontin 600 mg p.o. 3 times daily. The patient was advised to call immediately if he has any concerning symptoms in the interval.  The patient voices understanding of current disease status and treatment options and is in agreement with the current care plan. All questions were answered. The patient knows to call the clinic with any problems, questions or concerns. We can certainly see the patient much sooner if necessary.  Disclaimer: This note was dictated with voice recognition software. Similar sounding words can inadvertently be transcribed and may not be corrected upon review.

## 2020-10-29 ENCOUNTER — Telehealth: Payer: Self-pay | Admitting: Internal Medicine

## 2020-10-29 NOTE — Telephone Encounter (Signed)
Scheduled appt per 1/11 los - mailed letter with appt date and time

## 2020-11-13 ENCOUNTER — Ambulatory Visit: Payer: Self-pay | Admitting: General Surgery

## 2020-12-18 ENCOUNTER — Encounter (HOSPITAL_BASED_OUTPATIENT_CLINIC_OR_DEPARTMENT_OTHER): Payer: Self-pay | Admitting: General Surgery

## 2020-12-19 ENCOUNTER — Other Ambulatory Visit: Payer: Self-pay

## 2020-12-19 ENCOUNTER — Encounter (HOSPITAL_BASED_OUTPATIENT_CLINIC_OR_DEPARTMENT_OTHER): Payer: Self-pay | Admitting: General Surgery

## 2020-12-19 NOTE — Progress Notes (Addendum)
Spoke w/ via phone for pre-op interview--- PT Lab needs dos---- Istat and EKG              Lab results------ current CT chest/ abd in epic COVID test ------ 12-22-2020 @ 1045 Arrive at ------- 0730 on 12-25-2020 NPO after MN NO Solid Food.  Clear liquids from MN until--- 0630 Medications to take morning of surgery ----- Gabapentin, Zocor, Toprol, Prilosec, Imodium Diabetic medication ----- n/a Patient Special Instructions ----- asked to bring rescue inhaler dos Pre-Op special Istructions ----- n/a Patient verbalized understanding of instructions that were given at this phone interview. Patient denies usual shortness of breath, chest pain, fever, but dose have smoker's cough at this phone interview.   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  Pt states sob w/ stairs but able to do house chores no sob, never been on oxygen, and uses rescue prn (last used week ago for sob).  No change in pt status since last assessment done by Dr Julien Nordmann 10-28-2020.  PCP: Dr A. Law (lov 10-13-2020 epic) Oncologist:  Dr Julien Nordmann (lov 10-28-2020 epic) Neurologist:  Dr Leta Baptist (lov 11-14-2017 epic, pt released) Pulmonology:  Dr Concepcion Living (lov 09-29-2018 epic) Chest x-ray :  CT chest/ abd 10-17-2020 epic EKG : 05-07-2019 epic Echo : 03-09-2017 epic Stress test: no Cardiac Cath :  no Activity level:  See above Sleep Study/ CPAP :  NO Blood Thinner/ Instructions Maryjane Hurter Dose:  NO ASA / Instructions/ Last Dose : ASA 81mg / pt stated was not given any instructions from Dr Kieth Brightly office that he would need to stop prior to surgery

## 2020-12-22 ENCOUNTER — Other Ambulatory Visit (HOSPITAL_COMMUNITY)
Admission: RE | Admit: 2020-12-22 | Discharge: 2020-12-22 | Disposition: A | Discharge status: Home / Self Care | Payer: Medicare Other | Source: Ambulatory Visit | Attending: General Surgery | Admitting: General Surgery

## 2020-12-22 DIAGNOSIS — Z20822 Contact with and (suspected) exposure to covid-19: Secondary | ICD-10-CM | POA: Insufficient documentation

## 2020-12-22 DIAGNOSIS — Z01812 Encounter for preprocedural laboratory examination: Secondary | ICD-10-CM | POA: Insufficient documentation

## 2020-12-22 LAB — SARS CORONAVIRUS 2 (TAT 6-24 HRS): SARS Coronavirus 2: NEGATIVE

## 2020-12-24 NOTE — Anesthesia Preprocedure Evaluation (Addendum)
Anesthesia Evaluation  Patient identified by MRN, date of birth, ID band Patient awake    Reviewed: Allergy & Precautions, NPO status , Patient's Chart, lab work & pertinent test results  Airway Mallampati: II  TM Distance: >3 FB Neck ROM: Full    Dental  (+) Dental Advisory Given, Poor Dentition, Missing, Chipped   Pulmonary shortness of breath, COPD, Current Smoker and Patient abstained from smoking.,  Metastatic ca to lung   Pulmonary exam normal breath sounds clear to auscultation       Cardiovascular hypertension, Pt. on medications + angina with exertion + DOE  Normal cardiovascular exam Rhythm:Regular Rate:Normal  Echo 2018 - Left ventricle: The cavity size was normal. Systolic function was vigorous. The estimated ejection fraction was in the range of 65% to 70%. Wall motion was normal; there were no regional wall motion abnormalities. Doppler parameters are consistent with abnormal left ventricular relaxation (grade 1 diastolic dysfunction).  - Aortic valve: Trileaflet; mildly thickened, mildly calcified  leaflets.  - Aorta: Aortic root dimension: 45 mm (ED).  - Ascending aorta: The ascending aorta was moderately dilated.  - Right ventricle: The cavity size was mildly dilated. Wall  thickness was normal.    Neuro/Psych PSYCHIATRIC DISORDERS Depression  Neuromuscular disease    GI/Hepatic Neg liver ROS, PUD, GERD  ,Bowel perforation.   Endo/Other  negative endocrine ROS  Renal/GU Renal disease     Musculoskeletal   Abdominal   Peds  Hematology negative hematology ROS (+)   Anesthesia Other Findings   Reproductive/Obstetrics                            Lab Results  Component Value Date   WBC 7.3 10/28/2020   HGB 13.0 10/28/2020   HCT 39.9 10/28/2020   MCV 91.3 10/28/2020   PLT 221 10/28/2020   Lab Results  Component Value Date   CREATININE 0.95 10/28/2020   BUN 12  10/28/2020   NA 139 10/28/2020   K 4.2 10/28/2020   CL 103 10/28/2020   CO2 31 10/28/2020    Anesthesia Physical  Anesthesia Plan  ASA: III  Anesthesia Plan: General   Post-op Pain Management: GA combined w/ Regional for post-op pain   Induction: Intravenous and Rapid sequence  PONV Risk Score and Plan: 3 and Ondansetron, Dexamethasone, Midazolam, Treatment may vary due to age or medical condition and Diphenhydramine  Airway Management Planned: Oral ETT  Additional Equipment: None  Intra-op Plan:   Post-operative Plan: Extubation in OR  Informed Consent: I have reviewed the patients History and Physical, chart, labs and discussed the procedure including the risks, benefits and alternatives for the proposed anesthesia with the patient or authorized representative who has indicated his/her understanding and acceptance.     Dental advisory given  Plan Discussed with: CRNA  Anesthesia Plan Comments:        Anesthesia Quick Evaluation

## 2020-12-25 ENCOUNTER — Ambulatory Visit (HOSPITAL_BASED_OUTPATIENT_CLINIC_OR_DEPARTMENT_OTHER): Payer: Medicare Other | Admitting: Anesthesiology

## 2020-12-25 ENCOUNTER — Encounter (HOSPITAL_BASED_OUTPATIENT_CLINIC_OR_DEPARTMENT_OTHER): Payer: Self-pay | Admitting: General Surgery

## 2020-12-25 ENCOUNTER — Ambulatory Visit (HOSPITAL_BASED_OUTPATIENT_CLINIC_OR_DEPARTMENT_OTHER)
Admission: RE | Admit: 2020-12-25 | Discharge: 2020-12-25 | Disposition: A | Discharge status: Home / Self Care | Payer: Medicare Other | Source: Ambulatory Visit | Attending: General Surgery | Admitting: General Surgery

## 2020-12-25 ENCOUNTER — Encounter (HOSPITAL_BASED_OUTPATIENT_CLINIC_OR_DEPARTMENT_OTHER)
Admission: RE | Disposition: A | Discharge status: Home / Self Care | Payer: Self-pay | Source: Ambulatory Visit | Attending: General Surgery

## 2020-12-25 DIAGNOSIS — Z79899 Other long term (current) drug therapy: Secondary | ICD-10-CM | POA: Insufficient documentation

## 2020-12-25 DIAGNOSIS — Z923 Personal history of irradiation: Secondary | ICD-10-CM | POA: Diagnosis not present

## 2020-12-25 DIAGNOSIS — Z7982 Long term (current) use of aspirin: Secondary | ICD-10-CM | POA: Diagnosis not present

## 2020-12-25 DIAGNOSIS — Z85118 Personal history of other malignant neoplasm of bronchus and lung: Secondary | ICD-10-CM | POA: Insufficient documentation

## 2020-12-25 DIAGNOSIS — K4090 Unilateral inguinal hernia, without obstruction or gangrene, not specified as recurrent: Secondary | ICD-10-CM | POA: Diagnosis not present

## 2020-12-25 DIAGNOSIS — F17210 Nicotine dependence, cigarettes, uncomplicated: Secondary | ICD-10-CM | POA: Insufficient documentation

## 2020-12-25 DIAGNOSIS — Z8673 Personal history of transient ischemic attack (TIA), and cerebral infarction without residual deficits: Secondary | ICD-10-CM | POA: Diagnosis not present

## 2020-12-25 DIAGNOSIS — Z9221 Personal history of antineoplastic chemotherapy: Secondary | ICD-10-CM | POA: Diagnosis not present

## 2020-12-25 HISTORY — DX: Occlusion and stenosis of right carotid artery: I65.21

## 2020-12-25 HISTORY — DX: Drug-induced polyneuropathy: T45.1X5A

## 2020-12-25 HISTORY — DX: Other forms of dyspnea: R06.09

## 2020-12-25 HISTORY — DX: Diverticulosis of large intestine without perforation or abscess without bleeding: K57.30

## 2020-12-25 HISTORY — DX: Drug-induced polyneuropathy: G62.0

## 2020-12-25 HISTORY — DX: Personal history of antineoplastic chemotherapy: Z92.21

## 2020-12-25 HISTORY — DX: Occlusion and stenosis of left carotid artery: I65.22

## 2020-12-25 HISTORY — DX: Unilateral inguinal hernia, without obstruction or gangrene, not specified as recurrent: K40.90

## 2020-12-25 HISTORY — DX: Personal history of transient ischemic attack (TIA), and cerebral infarction without residual deficits: Z86.73

## 2020-12-25 HISTORY — DX: Abdominal aortic aneurysm, without rupture, unspecified: I71.40

## 2020-12-25 HISTORY — DX: Simple chronic bronchitis: J41.0

## 2020-12-25 HISTORY — DX: Aneurysm of iliac artery: I72.3

## 2020-12-25 HISTORY — DX: Dyspnea, unspecified: R06.00

## 2020-12-25 HISTORY — PX: INGUINAL HERNIA REPAIR: SHX194

## 2020-12-25 HISTORY — DX: Personal history of Methicillin resistant Staphylococcus aureus infection: Z86.14

## 2020-12-25 HISTORY — DX: Chronic obstructive pulmonary disease, unspecified: J44.9

## 2020-12-25 HISTORY — DX: Gastro-esophageal reflux disease without esophagitis: K21.9

## 2020-12-25 HISTORY — DX: Noninfective gastroenteritis and colitis, unspecified: K52.9

## 2020-12-25 HISTORY — DX: Abdominal aortic aneurysm, without rupture: I71.4

## 2020-12-25 HISTORY — DX: Presence of spectacles and contact lenses: Z97.3

## 2020-12-25 LAB — POCT I-STAT, CHEM 8
BUN: 11 mg/dL (ref 8–23)
Calcium, Ion: 1.22 mmol/L (ref 1.15–1.40)
Chloride: 98 mmol/L (ref 98–111)
Creatinine, Ser: 0.7 mg/dL (ref 0.61–1.24)
Glucose, Bld: 93 mg/dL (ref 70–99)
HCT: 37 % — ABNORMAL LOW (ref 39.0–52.0)
Hemoglobin: 12.6 g/dL — ABNORMAL LOW (ref 13.0–17.0)
Potassium: 4 mmol/L (ref 3.5–5.1)
Sodium: 142 mmol/L (ref 135–145)
TCO2: 31 mmol/L (ref 22–32)

## 2020-12-25 SURGERY — REPAIR, HERNIA, INGUINAL, ADULT
Anesthesia: General | Site: Abdomen | Laterality: Right

## 2020-12-25 MED ORDER — OXYCODONE HCL 5 MG/5ML PO SOLN: 5.0000 mg | Freq: Once | ORAL | Status: DC | PRN

## 2020-12-25 MED ORDER — ALBUTEROL SULFATE (2.5 MG/3ML) 0.083% IN NEBU
2.5000 mg | INHALATION_SOLUTION | Freq: Four times a day (QID) | RESPIRATORY_TRACT | Status: DC | PRN
  Administered 2020-12-25: 2.5 mg via RESPIRATORY_TRACT

## 2020-12-25 MED ORDER — MIDAZOLAM HCL 2 MG/2ML IJ SOLN
INTRAMUSCULAR | Status: AC
  Filled 2020-12-25: qty 2

## 2020-12-25 MED ORDER — FENTANYL CITRATE (PF) 100 MCG/2ML IJ SOLN
INTRAMUSCULAR | Status: AC
  Filled 2020-12-25: qty 2

## 2020-12-25 MED ORDER — ALBUTEROL SULFATE (2.5 MG/3ML) 0.083% IN NEBU
INHALATION_SOLUTION | RESPIRATORY_TRACT | Status: AC
  Filled 2020-12-25: qty 3

## 2020-12-25 MED ORDER — SUGAMMADEX SODIUM 200 MG/2ML IV SOLN
INTRAVENOUS | Status: DC | PRN
  Administered 2020-12-25: 100 mg via INTRAVENOUS
  Administered 2020-12-25: 200 mg via INTRAVENOUS

## 2020-12-25 MED ORDER — DEXAMETHASONE SODIUM PHOSPHATE 10 MG/ML IJ SOLN
INTRAMUSCULAR | Status: AC
  Filled 2020-12-25: qty 1

## 2020-12-25 MED ORDER — CELECOXIB 200 MG PO CAPS
400.0000 mg | ORAL_CAPSULE | ORAL | Status: AC
  Administered 2020-12-25: 400 mg via ORAL

## 2020-12-25 MED ORDER — ROCURONIUM BROMIDE 10 MG/ML (PF) SYRINGE
PREFILLED_SYRINGE | INTRAVENOUS | Status: AC
  Filled 2020-12-25: qty 10

## 2020-12-25 MED ORDER — FENTANYL CITRATE (PF) 100 MCG/2ML IJ SOLN
50.0000 ug | Freq: Once | INTRAMUSCULAR | Status: AC
  Administered 2020-12-25: 50 ug via INTRAVENOUS

## 2020-12-25 MED ORDER — CEFAZOLIN SODIUM-DEXTROSE 2-4 GM/100ML-% IV SOLN
2.0000 g | INTRAVENOUS | Status: AC
  Administered 2020-12-25: 2 g via INTRAVENOUS

## 2020-12-25 MED ORDER — CLONIDINE HCL (ANALGESIA) 100 MCG/ML EP SOLN
EPIDURAL | Status: DC | PRN
  Administered 2020-12-25: 80 ug

## 2020-12-25 MED ORDER — ACETAMINOPHEN 500 MG PO TABS
ORAL_TABLET | ORAL | Status: AC
  Filled 2020-12-25: qty 2

## 2020-12-25 MED ORDER — CEFAZOLIN SODIUM-DEXTROSE 2-4 GM/100ML-% IV SOLN
INTRAVENOUS | Status: AC
  Filled 2020-12-25: qty 100

## 2020-12-25 MED ORDER — LIDOCAINE 2% (20 MG/ML) 5 ML SYRINGE
INTRAMUSCULAR | Status: AC
  Filled 2020-12-25: qty 5

## 2020-12-25 MED ORDER — MIDAZOLAM HCL 2 MG/2ML IJ SOLN
INTRAMUSCULAR | Status: DC | PRN
  Administered 2020-12-25: 2 mg via INTRAVENOUS

## 2020-12-25 MED ORDER — 0.9 % SODIUM CHLORIDE (POUR BTL) OPTIME
TOPICAL | Status: DC | PRN
  Administered 2020-12-25: 500 mL

## 2020-12-25 MED ORDER — HYDROMORPHONE HCL 1 MG/ML IJ SOLN
INTRAMUSCULAR | Status: AC
  Filled 2020-12-25: qty 1

## 2020-12-25 MED ORDER — DEXAMETHASONE SODIUM PHOSPHATE 4 MG/ML IJ SOLN
INTRAMUSCULAR | Status: DC | PRN
  Administered 2020-12-25: 5 mg

## 2020-12-25 MED ORDER — OXYCODONE HCL 5 MG PO TABS: 5.0000 mg | ORAL_TABLET | Freq: Four times a day (QID) | ORAL | 0 refills | Status: DC | PRN

## 2020-12-25 MED ORDER — BUPIVACAINE-EPINEPHRINE (PF) 0.5% -1:200000 IJ SOLN
INTRAMUSCULAR | Status: DC | PRN
  Administered 2020-12-25: 30 mL

## 2020-12-25 MED ORDER — LACTATED RINGERS IV SOLN: INTRAVENOUS | Status: DC

## 2020-12-25 MED ORDER — ONDANSETRON HCL 4 MG/2ML IJ SOLN
INTRAMUSCULAR | Status: AC
  Filled 2020-12-25: qty 2

## 2020-12-25 MED ORDER — MEPERIDINE HCL 25 MG/ML IJ SOLN: 6.2500 mg | INTRAMUSCULAR | Status: DC | PRN

## 2020-12-25 MED ORDER — FENTANYL CITRATE (PF) 100 MCG/2ML IJ SOLN
INTRAMUSCULAR | Status: DC | PRN
  Administered 2020-12-25: 50 ug via INTRAVENOUS

## 2020-12-25 MED ORDER — CHLORHEXIDINE GLUCONATE CLOTH 2 % EX PADS: 6.0000 | MEDICATED_PAD | Freq: Once | CUTANEOUS | Status: DC

## 2020-12-25 MED ORDER — CELECOXIB 200 MG PO CAPS
ORAL_CAPSULE | ORAL | Status: AC
  Filled 2020-12-25: qty 2

## 2020-12-25 MED ORDER — ACETAMINOPHEN 500 MG PO TABS
1000.0000 mg | ORAL_TABLET | ORAL | Status: AC
  Administered 2020-12-25: 1000 mg via ORAL

## 2020-12-25 MED ORDER — PROPOFOL 10 MG/ML IV BOLUS
INTRAVENOUS | Status: AC
  Filled 2020-12-25: qty 20

## 2020-12-25 MED ORDER — OXYCODONE HCL 5 MG PO TABS: 5.0000 mg | ORAL_TABLET | Freq: Once | ORAL | Status: DC | PRN

## 2020-12-25 MED ORDER — PROPOFOL 10 MG/ML IV BOLUS
INTRAVENOUS | Status: DC | PRN
  Administered 2020-12-25: 100 mg via INTRAVENOUS

## 2020-12-25 MED ORDER — HYDROMORPHONE HCL 1 MG/ML IJ SOLN
0.2500 mg | INTRAMUSCULAR | Status: DC | PRN
  Administered 2020-12-25: 0.25 mg via INTRAVENOUS

## 2020-12-25 MED ORDER — ENSURE PRE-SURGERY PO LIQD: 296.0000 mL | Freq: Once | ORAL | Status: DC

## 2020-12-25 MED ORDER — DEXAMETHASONE SODIUM PHOSPHATE 10 MG/ML IJ SOLN
INTRAMUSCULAR | Status: DC | PRN
  Administered 2020-12-25 (×2): 5 mg via INTRAVENOUS

## 2020-12-25 MED ORDER — AMISULPRIDE (ANTIEMETIC) 5 MG/2ML IV SOLN: 10.0000 mg | Freq: Once | INTRAVENOUS | Status: DC | PRN

## 2020-12-25 MED ORDER — ROCURONIUM BROMIDE 10 MG/ML (PF) SYRINGE
PREFILLED_SYRINGE | INTRAVENOUS | Status: DC | PRN
  Administered 2020-12-25: 50 mg via INTRAVENOUS

## 2020-12-25 MED ORDER — LIDOCAINE 2% (20 MG/ML) 5 ML SYRINGE
INTRAMUSCULAR | Status: DC | PRN
  Administered 2020-12-25: 60 mg via INTRAVENOUS

## 2020-12-25 MED ORDER — ONDANSETRON HCL 4 MG/2ML IJ SOLN
INTRAMUSCULAR | Status: DC | PRN
  Administered 2020-12-25: 4 mg via INTRAVENOUS

## 2020-12-25 SURGICAL SUPPLY — 49 items
ADH SKN CLS APL DERMABOND .7 (GAUZE/BANDAGES/DRESSINGS) ×1
APL PRP STRL LF DISP 70% ISPRP (MISCELLANEOUS) ×1
BLADE CLIPPER SENSICLIP SURGIC (BLADE) ×2 IMPLANT
BLADE HEX COATED 2.75 (ELECTRODE) ×2 IMPLANT
BLADE SURG 15 STRL LF DISP TIS (BLADE) ×1 IMPLANT
BLADE SURG 15 STRL SS (BLADE) ×2
CANISTER SUCT 3000ML PPV (MISCELLANEOUS) IMPLANT
CELLS DAT CNTRL 66122 CELL SVR (MISCELLANEOUS) IMPLANT
CHLORAPREP W/TINT 26 (MISCELLANEOUS) ×2 IMPLANT
COVER BACK TABLE 60X90IN (DRAPES) ×2 IMPLANT
COVER MAYO STAND STRL (DRAPES) ×2 IMPLANT
COVER WAND RF STERILE (DRAPES) ×2 IMPLANT
DERMABOND ADVANCED (GAUZE/BANDAGES/DRESSINGS) ×1
DERMABOND ADVANCED .7 DNX12 (GAUZE/BANDAGES/DRESSINGS) ×1 IMPLANT
DRAIN PENROSE 0.25X18 (DRAIN) ×2 IMPLANT
DRAPE LAPAROSCOPIC ABDOMINAL (DRAPES) ×2 IMPLANT
DRAPE UTILITY XL STRL (DRAPES) ×2 IMPLANT
ELECT REM PT RETURN 9FT ADLT (ELECTROSURGICAL) ×2
ELECTRODE REM PT RTRN 9FT ADLT (ELECTROSURGICAL) ×1 IMPLANT
GLOVE SURG POLYISO LF SZ7 (GLOVE) ×2 IMPLANT
GLOVE SURG UNDER POLY LF SZ7 (GLOVE) ×2 IMPLANT
GOWN STRL REUS W/TWL LRG LVL3 (GOWN DISPOSABLE) ×2 IMPLANT
KIT TURNOVER CYSTO (KITS) ×2 IMPLANT
MESH HERNIA 3X6 (Mesh General) ×2 IMPLANT
NEEDLE HYPO 25X1 1.5 SAFETY (NEEDLE) ×2 IMPLANT
NS IRRIG 500ML POUR BTL (IV SOLUTION) ×2 IMPLANT
PACK BASIN DAY SURGERY FS (CUSTOM PROCEDURE TRAY) ×2 IMPLANT
PAD ARMBOARD 7.5X6 YLW CONV (MISCELLANEOUS) ×2 IMPLANT
PENCIL SMOKE EVACUATOR (MISCELLANEOUS) ×2 IMPLANT
RTRCTR WOUND ALEXIS 18CM MED (MISCELLANEOUS)
SPONGE LAP 4X18 RFD (DISPOSABLE) IMPLANT
SUT MNCRL AB 4-0 PS2 18 (SUTURE) ×2 IMPLANT
SUT PDS AB 0 CT1 36 (SUTURE) IMPLANT
SUT PDS AB 2-0 CT2 27 (SUTURE) IMPLANT
SUT PROLENE 2 0 CT2 30 (SUTURE) ×8 IMPLANT
SUT SILK 3 0 TIES 17X18 (SUTURE)
SUT SILK 3-0 18XBRD TIE BLK (SUTURE) IMPLANT
SUT VIC AB 2-0 SH 18 (SUTURE) ×2 IMPLANT
SUT VIC AB 2-0 SH 27 (SUTURE) ×2
SUT VIC AB 2-0 SH 27XBRD (SUTURE) ×1 IMPLANT
SUT VIC AB 3-0 SH 27 (SUTURE) ×2
SUT VIC AB 3-0 SH 27X BRD (SUTURE) ×1 IMPLANT
SUT VICRYL 2 0 18  UND BR (SUTURE) ×1
SUT VICRYL 2 0 18 UND BR (SUTURE) ×1 IMPLANT
SYR BULB IRRIG 60ML STRL (SYRINGE) ×2 IMPLANT
SYR CONTROL 10ML LL (SYRINGE) ×2 IMPLANT
TOWEL OR 17X26 10 PK STRL BLUE (TOWEL DISPOSABLE) ×2 IMPLANT
TUBE CONNECTING 12X1/4 (SUCTIONS) ×2 IMPLANT
YANKAUER SUCT BULB TIP NO VENT (SUCTIONS) ×2 IMPLANT

## 2020-12-25 NOTE — H&P (Signed)
[name] [name] is an 70 y.o. male.   Chief Complaint: inguinal hernia HPI: 70 yo male with right groin pain. On exam he has a moderate sized inguinal hernia. He presents for repair.  Past Medical History:  Diagnosis Date  . AAA (abdominal aortic aneurysm) ([place])    per CT in epic 10-17-2020 3.6cm  . Bilateral iliac artery aneurysm ([facility])    per CT in epic 10-17-2020  1.9cm  . Carotid occlusion, right    with infarction 2017  . Chronic diarrhea   . COPD (chronic obstructive pulmonary disease) ([place])    previously has seen pulmonolgy, dr Mamie Nick. mannan (lov 09-29-2018 in epic) ;   (12-19-2020  pt stated no oxygen, uses rescue inhaler as needed, and sob with stairs)  . Depression   . Diverticulosis of colon   . DOE (dyspnea on exertion)    per pt sob w/ stairs and recovers after few minutes  . Dyspnea   . GERD (gastroesophageal reflux disease)   . History of antineoplastic chemotherapy 01/2017  to 09/ 2019   lung cancer  . History of cerebrovascular accident (CVA) from right carotid artery occlusion involving right middle cerebral artery territory 2018     (last neurology note 11-14-2017 pt released on prn basis back to pcp follow up)   neurologist--- dr v. Leta Baptist--- left hand weakness/ numbing intermittantly in setting of lung cancer, MRI 12/ 2017 right frontal ishcemic infartions(watershed vs. embolic infarts due to right ICA stenosis or occlusion vs. cerebral metastateses) confirmed by duplex occluded right ICA  (03-04-20022 pt stated no residual)  . History of duodenal ulcer 02/2017   perforated pylori channel ulcer s/p surgery , modified graham's patch  . History of MRSA infection    remote  . History of radiation therapy 08/2018   SRBT left upper lobe  . History of sepsis 05/07/2019   hospital admission pt dehydrated with AKI--- resolved  . Hypertension   . Left carotid stenosis    per duplex 40-59%  . Metastatic squamous cell carcinoma to lung, unspecified laterality [facility])  oncologist-- dr Mayme Genta   dx 04/ 2018 , bronchoscopy w/ bx's for bilateral pulm. nodules,  Stage 4 non-small cell ; completed chemo 09/ 2019 and had immunotherapy and SBRT to left upper lobe 11/ 2019  . Peripheral neuropathy due to chemotherapy ([place])   . Right inguinal hernia   . Smokers' cough ([place])    per pt mostly productive in am   . Wears glasses     Past Surgical History:  Procedure Laterality Date  . COLONOSCOPY    . LAPAROTOMY N/A 02/25/2017   Procedure: EXPLORATORY LAPAROTOMY MODIFIED GRAHAM'S PATCH;  Surgeon: Ziyon Soltau, Arta Bruce, MD;  Location: [facility];  Service: General;  Laterality: N/A;  . MINOR PLACEMENT OF FIDUCIAL N/A 01/19/2017   Procedure: MINOR PLACEMENT OF FIDUCIAL x 6;  Surgeon: Collene Gobble, MD;  Location: [place];  Service: Thoracic;  Laterality: N/A;  . PILONIDAL CYST EXCISION  2019  . VIDEO BRONCHOSCOPY WITH ENDOBRONCHIAL NAVIGATION N/A 01/19/2017   Procedure: VIDEO BRONCHOSCOPY WITH ENDOBRONCHIAL NAVIGATION;  Surgeon: Collene Gobble, MD;  Location: [facility];  Service: Thoracic;  Laterality: N/A;    Family History  Problem Relation Age of Onset  . Cancer Father        unsure of what type   Social History:  reports that he has been smoking cigarettes. He has a 52.00 pack-year smoking history. He has never used smokeless tobacco. He reports current drug  use. Drug: Marijuana. He reports that he does not drink alcohol.  Allergies: No Known Allergies  Medications Prior to Admission  Medication Sig Dispense Refill  . acetaminophen (TYLENOL) 325 MG tablet You can take 2 every 4 hours as needed.  You can buy it over the counter DO NOT TAKE MORE THAN 4000 MG OF TYLENOL PER DAY.  IT CAN HARM YOUR LIVER.    Marland Kitchen albuterol (VENTOLIN HFA) 108 (90 Base) MCG/ACT inhaler Inhale into the lungs every 6 (six) hours as needed for wheezing or shortness of breath.    Marland Kitchen albuterol (VENTOLIN HFA) 108 (90 Base) MCG/ACT inhaler Inhale into the lungs every 6 (six) hours as needed for wheezing  or shortness of breath.    Marland Kitchen amitriptyline (ELAVIL) 100 MG tablet Take 1 tablet (100 mg total) by mouth at bedtime. 30 tablet 3  . aspirin EC 81 MG tablet Take 81 mg by mouth daily.    Marland Kitchen gabapentin (NEURONTIN) 300 MG capsule TAKE 2 CAPSULES BY MOUTH THREE TIMES DAILY (Patient taking differently: Take 600 mg by mouth 3 (three) times daily.) 90 capsule 0  . lisinopril (ZESTRIL) 2.5 MG tablet Take 2.5 mg by mouth daily.    . metoprolol succinate (TOPROL-XL) 25 MG 24 hr tablet Take 1 tablet (25 mg total) by mouth daily. 30 tablet 4  . mirtazapine (REMERON) 30 MG tablet TAKE 1 TABLET BY MOUTH AT BEDTIME (Patient taking differently: Take 30 mg by mouth at bedtime.) 30 tablet 0  . nortriptyline (PAMELOR) 10 MG capsule Take 10 mg by mouth 2 (two) times daily.    . Omega-3 Fatty Acids (OMEGA-3 FISH OIL PO) Take 1 capsule by mouth daily.    Marland Kitchen omeprazole (PRILOSEC) 20 MG capsule Take 20 mg by mouth 2 (two) times daily before a meal.    . simvastatin (ZOCOR) 20 MG tablet Take 20 mg by mouth daily.    . temazepam (RESTORIL) 30 MG capsule Take 1 capsule (30 mg total) by mouth at bedtime. (Patient taking differently: Take 30 mg by mouth at bedtime.) 30 capsule 0  . loperamide (IMODIUM) 2 MG capsule Take 1 capsule (2 mg total) by mouth 2 (two) times daily. Also available over the counter. You can take 1 extra dose in the afternoon if diarrhea is not improving. 60 capsule 4  . mesalamine (APRISO) 0.375 g 24 hr capsule Take 4 capsules by mouth daily. (Patient not taking: No sig reported)      Results for orders placed or performed during the hospital encounter of 12/25/20 (from the past 48 hour(s))  I-STAT, chem 8     Status: Abnormal   Collection Time: 12/25/20  7:36 AM  Result Value Ref Range   Sodium 142 135 - 145 mmol/L   Potassium 4.0 3.5 - 5.1 mmol/L   Chloride 98 98 - 111 mmol/L   BUN 11 8 - 23 mg/dL   Creatinine, Ser 0.70 0.61 - 1.24 mg/dL   Glucose, Bld 93 70 - 99 mg/dL    Comment: Glucose reference  range applies only to samples taken after fasting for at least 8 hours.   Calcium, Ion 1.22 1.15 - 1.40 mmol/L   TCO2 31 22 - 32 mmol/L   Hemoglobin 12.6 (L) 13.0 - 17.0 g/dL   HCT 37.0 (L) 39.0 - 52.0 %   No results found.  Review of Systems  Constitutional: Negative for chills and fever.  HENT: Negative for hearing loss.   Respiratory: Negative for cough.   Cardiovascular: Negative  for chest pain and palpitations.  Gastrointestinal: Negative for abdominal pain, nausea and vomiting.  Genitourinary: Negative for dysuria and urgency.  Musculoskeletal: Negative for myalgias and neck pain.  Skin: Negative for rash.  Neurological: Negative for dizziness and headaches.  Hematological: Does not bruise/bleed easily.  Psychiatric/Behavioral: Negative for suicidal ideas.    Blood pressure 124/73, pulse 71, temperature (!) 97.4 F (36.3 C), temperature source Oral, resp. rate 14, height 5\' 8"  (1.727 m), weight 70.1 kg, SpO2 97 %. Physical Exam Vitals reviewed.  Constitutional:      Appearance: He is well-developed.  HENT:     Head: Normocephalic and atraumatic.  Eyes:     Conjunctiva/sclera: Conjunctivae normal.     Pupils: Pupils are equal, round, and reactive to light.  Cardiovascular:     Rate and Rhythm: Normal rate and regular rhythm.  Pulmonary:     Effort: Pulmonary effort is normal.     Breath sounds: Normal breath sounds.  Abdominal:     General: Bowel sounds are normal. There is no distension.     Palpations: Abdomen is soft.     Tenderness: There is no abdominal tenderness.     Comments: Right inguinal hernia  Musculoskeletal:        General: Normal range of motion.     Cervical back: Normal range of motion and neck supple.  Skin:    General: Skin is warm and dry.  Neurological:     Mental Status: He is alert and oriented to person, place, and time.  Psychiatric:        Behavior: Behavior normal.      Assessment/Plan 70 yo male with right inguinal  hernia -right open inguinal hernia repair with mesh -ERAS protocol -outpatient procedure  Mickeal Skinner, MD 12/25/2020, 9:10 AM

## 2020-12-25 NOTE — Discharge Instructions (Signed)
[facility] _______[facility]  UMBILICAL OR INGUINAL HERNIA REPAIR: POST OP INSTRUCTIONS  Always review your discharge instruction sheet given to you by the facility where your surgery was performed. IF YOU HAVE DISABILITY OR FAMILY LEAVE FORMS, YOU MUST BRING THEM TO THE OFFICE FOR PROCESSING.   DO NOT GIVE THEM TO YOUR DOCTOR.  1. A  prescription for pain medication may be given to you upon discharge.  Take your pain medication as prescribed, if needed.  If narcotic pain medicine is not needed, then you may take acetaminophen (Tylenol) or ibuprofen (Advil) as needed. 2. Take your usually prescribed medications unless otherwise directed. If you need a refill on your pain medication, please contact your pharmacy.  They will contact our office to request authorization. Prescriptions will not be filled after 5 pm or on week-ends. 3. You should follow a light diet the first 24 hours after arrival home, such as soup and crackers, etc.  Be sure to include lots of fluids daily.  Resume your normal diet the day after surgery. 4.Most patients will experience some swelling and bruising around the umbilicus or in the groin and scrotum.  Ice packs and reclining will help.  Swelling and bruising can take several days to resolve.  6. It is common to experience some constipation if taking pain medication after surgery.  Increasing fluid intake and taking a stool softener (such as Colace) will usually help or prevent this problem from occurring.  A mild laxative (Milk of Magnesia or Miralax) should be taken according to package directions if there are no bowel movements after 48 hours. 7. Unless discharge instructions indicate otherwise, you may remove your bandages 24-48 hours after surgery, and you may shower at that time.  You may have steri-strips (small skin tapes) in place directly over the incision.  These strips should be left on the skin for 7-10 days.  If your surgeon used skin glue on the  incision, you may shower in 24 hours.  The glue will flake off over the next 2-3 weeks.  Any sutures or staples will be removed at the office during your follow-up visit. 8. ACTIVITIES:  You may resume regular (light) daily activities beginning the next day--such as daily self-care, walking, climbing stairs--gradually increasing activities as tolerated.  You may have sexual intercourse when it is comfortable.  Refrain from any heavy lifting or straining until approved by your doctor.  a.You may drive when you are no longer taking prescription pain medication, you can comfortably wear a seatbelt, and you can safely maneuver your car and apply brakes. b.RETURN TO WORK:   _____________________________________________  9.You should see your doctor in the office for a follow-up appointment approximately 2-3 weeks after your surgery.  Make sure that you call for this appointment within a day or two after you arrive home to insure a convenient appointment time. 10.OTHER INSTRUCTIONS: _________________________    _____________________________________  WHEN TO CALL YOUR DOCTOR: 1. Fever over 101.0 2. Inability to urinate 3. Nausea and/or vomiting 4. Extreme swelling or bruising 5. Continued bleeding from incision. 6. Increased pain, redness, or drainage from the incision  The clinic staff is available to answer your questions during regular business hours.  Please don't hesitate to call and ask to speak to one of the nurses for clinical concerns.  If you have a medical emergency, go to the nearest emergency room or call 911.  A surgeon from [facility] is always on call at the hospital  [address] [place], [place], [place]  [address] ?  [address], [place], [place]   [address] [phone] ? [phone] ? FAX [phone] Web site: [email or web address]   Post Anesthesia Home Care Instructions  Activity: Get plenty of rest for the remainder of the day. A  responsible individual must stay with you for 24 hours following the procedure.  For the next 24 hours, DO NOT: -Drive a car -Paediatric nurse -Drink alcoholic beverages -Take any medication unless instructed by your physician -Make any legal decisions or sign important papers.  Meals: Start with liquid foods such as gelatin or soup. Progress to regular foods as tolerated. Avoid greasy, spicy, heavy foods. If nausea and/or vomiting occur, drink only clear liquids until the nausea and/or vomiting subsides. Call your physician if vomiting continues.  Special Instructions/Symptoms: Your throat may feel dry or sore from the anesthesia or the breathing tube placed in your throat during surgery. If this causes discomfort, gargle with warm salt water. The discomfort should disappear within 24 hours.   Open Hernia Repair, Adult, Care After What can I expect after the procedure? After the procedure, it is common to have:  Mild discomfort.  Slight bruising.  Mild swelling.  Pain in the belly (abdomen).  A small amount of blood from the cut from surgery (incision). Follow these instructions at home: Your doctor may give you more specific instructions. If you have problems, call your doctor. Medicines  Take over-the-counter and prescription medicines only as told by your doctor.  If told, take steps to prevent problems with pooping (constipation). You may need to: ? Drink enough fluid to keep your pee (urine) pale yellow. ? Take medicines. You will be told what medicines to take. ? Eat foods that are high in fiber. These include beans, whole grains, and fresh fruits and vegetables. ? Limit foods that are high in fat and sugar. These include fried or sweet foods.  Ask your doctor if you should avoid driving or using machines while you are taking your medicine. Incision care  Follow instructions from your doctor about how to take care of your incision. Make sure you: ? Wash your hands  with soap and water for at least 20 seconds before and after you change your bandage (dressing). If you cannot use soap and water, use hand sanitizer. ? Change your bandage. ? Leave stitches or skin glue in place for at least 2 weeks. ? Leave tape strips alone unless you are told to take them off. You may trim the edges of the tape strips if they curl up.  Check your incision every day for signs of infection. Check for: ? More redness, swelling, or pain. ? More fluid or blood. ? Warmth. ? Pus or a bad smell.  Wear loose, soft clothing while your incision heals.   Activity  Rest as told by your doctor.  Do not lift anything that is heavier than 10 lb (4.5 kg), or the limit that you are told.  Do not play contact sports until your doctor says that this is safe.  If you were given a sedative during your procedure, do not drive or use machines until your doctor says that it is safe. A sedative is a medicine that helps you relax.  Return to your normal activities when your doctor says that it is safe.   General instructions  Do not take baths, swim, or use a hot tub. Ask your doctor about taking showers or sponge baths.  Hold a pillow over your belly when you cough or sneeze. This helps with pain.  Do not smoke or use any products that contain nicotine or tobacco. If you need help quitting, ask your doctor.  Keep all follow-up visits. Contact a doctor if:  You have any of these signs of infection in or around your incision: ? More redness, swelling, or pain. ? More fluid or blood. ? Warmth. ? Pus. ? A bad smell.  You have a fever or chills.  You have blood in your poop (stool).  You have not pooped (had a bowel movement) in 2-3 days.  Medicine does not help your pain. Get help right away if:  You have chest pain, or you are short of breath.  You feel faint or light-headed.  You have very bad pain.  You vomit and your pain is worse.  You have pain, swelling, or  redness in a leg. These symptoms may be an emergency. Get help right away. Call your local emergency services (911 in the [place]  Do not wait to see if the symptoms will go away.  Do not drive yourself to the hospital. Summary  After this procedure, it is common to have mild discomfort, slight bruising, and mild swelling.  Follow instructions from your doctor about how to take care of your cut from surgery (incision). Check every day for signs of infection.  Do not lift heavy objects or play contact sports until your doctor says it is safe.  Return to your normal activities as told by your doctor. This information is not intended to replace advice given to you by your health care provider. Make sure you discuss any questions you have with your health care provider. Document Revised: 05/19/2020 Document Reviewed: 05/19/2020 Elsevier Patient Education  2021 [employer].

## 2020-12-25 NOTE — Transfer of Care (Signed)
Immediate Anesthesia Transfer of Care Note  Patient: [name] [name]  Procedure(s) Performed: RIGHT OPEN INGUINAL HERNIA REPAIR WITH MESH (Right Abdomen)  Patient Location: PACU  Anesthesia Type:General  Level of Consciousness: drowsy and responds to stimulation  Airway & Oxygen Therapy: Patient Spontanous Breathing and Patient connected to nasal cannula oxygen  Post-op Assessment: Report given to RN and Post -op Vital signs reviewed and stable  Post vital signs: Reviewed and stable  Last Vitals:  Vitals Value Taken Time  BP 129/80 12/25/20 1112  Temp    Pulse 68 12/25/20 1115  Resp 16 12/25/20 1115  SpO2 94 % 12/25/20 1115  Vitals shown include unvalidated device data.  Last Pain:  Vitals:   12/25/20 0708  TempSrc: Oral         Complications: No complications documented.

## 2020-12-25 NOTE — Op Note (Signed)
Preop diagnosis: right inguinal hernia  Postop diagnosis: right direct inguinal hernia  Procedure: open Right inguinal hernia repair with mesh  Surgeon: Gurney Maxin, M.D.  Asst: none  Anesthesia: Gen.   Indications for procedure: [name] [name] is a 70 y.o. male with symptoms of pain and enlarging Right inguinal hernia(s). After discussing risks, alternatives and benefits he decided on open repair and was brought to day surgery for repair.  Description of procedure: The patient was brought into the operative suite, placed supine. Anesthesia was administered with endotracheal tube. Patient was strapped in place. The patient was prepped and draped in the usual sterile fashion.  The anterior superior iliac spine and pubic tubercle were identified on the Right side. An incision was made 1cm above the connecting line, representative of the location of the inguinal ligament. The subcutaneous tissue was bluntly dissected, scarpa's fascia was dissected away. The external abdominal oblique fascia was identified and sharply opened down to the external inguinal ring. The conjoint tendon and inguinal ligament were identified. The cord structures and sac were dissected free of the surrounding tissue in 360 degrees. A penrose drain was used to encircle the contents. The cremasteric fibers were dissected free of the contents of the cord and hernia sac. The cord structures (vessels and vas deferens) were identified and carefully dissected away from the hernia sac. The hernia sac was reduced and contained no visceral structures.The hernia sac was dissected down to the internal inguinal ring. Preperitoneal fat was identified showing appropriate dissection. The sac was then reduced into the preperitoneal space. The hernia was direct The canal floor was large and closed with interrupted 0 PDS apposing the conjoint tendon to the inguinal ligament medially. A 3x6 Bard mesh was then used to close the defect and reinforce  the floor. The mesh was sutured to the lacunar ligament and inguinal ligament using a 2-0 prolene in running fashion. Next the superior edge of the mesh was sutured to the conjoined tendon using a 2-0 running Prolene. An additional 2-0 Prolene was used to suture the tail ends of the mesh together re-creating the deep ring. Cord structures are running in a neutral position through the mesh. Next the external abdominal oblique fascia was closed with a 2-0 Vicryl in interrupted fashion to re-create the external inguinal ring. Scarpa's fascia was closed with 3-0 Vicryl in running fashion. Skin was closed with a 4-0 Monocryl subcuticular stitch in running fashion. Dermabond place for dressing. Patient woke from anesthesia and brought to PACU in stable condition. All counts are correct.    Findings: right direct inguinal hernia  Specimen: none  Blood loss: 10 ml  Local anesthesia: preoperative block  Complications: none  Implant: 3 x 6 in Bard mesh  Gurney Maxin, M.D. General, Bariatric, & Minimally Invasive Surgery [facility] [place] 11:04 AM 12/25/2020

## 2020-12-25 NOTE — Anesthesia Procedure Notes (Signed)
Anesthesia Regional Block: TAP block   Pre-Anesthetic Checklist: ,, timeout performed, Correct Patient, Correct Site, Correct Laterality, Correct Procedure, Correct Position, site marked, Risks and benefits discussed,  Surgical consent,  Pre-op evaluation,  At surgeon's request and post-op pain management  Laterality: Right  Prep: chloraprep       Needles:   Needle Type: Stimiplex     Needle Length: 9cm      Additional Needles:   Procedures:,,,, ultrasound used (permanent image in chart),,,,  Narrative:  Start time: 12/25/2020 8:05 AM End time: 12/25/2020 8:20 AM Injection made incrementally with aspirations every 5 mL.  Performed by: Personally  Anesthesiologist: Nolon Nations, MD  Additional Notes: Patient tolerated well. Good fascial spread noted.

## 2020-12-25 NOTE — Progress Notes (Signed)
Assisted Dr. Lissa Hoard with right, ultrasound guided, transabdominal plane block. Side rails up, monitors on throughout procedure. See vital signs in flow sheet. Tolerated Procedure well.

## 2020-12-25 NOTE — Anesthesia Procedure Notes (Signed)

## 2020-12-26 ENCOUNTER — Encounter (HOSPITAL_BASED_OUTPATIENT_CLINIC_OR_DEPARTMENT_OTHER): Payer: Self-pay | Admitting: General Surgery

## 2020-12-26 NOTE — Anesthesia Postprocedure Evaluation (Signed)
Anesthesia Post Note  Patient: [name] [name]  Procedure(s) Performed: RIGHT OPEN INGUINAL HERNIA REPAIR WITH MESH (Right Abdomen)     Patient location during evaluation: PACU Anesthesia Type: General Level of consciousness: sedated and patient cooperative Pain management: pain level controlled Vital Signs Assessment: post-procedure vital signs reviewed and stable Respiratory status: spontaneous breathing Cardiovascular status: stable Anesthetic complications: no   No complications documented.  Last Vitals:  Vitals:   12/25/20 1255 12/25/20 1337  BP:  128/82  Pulse:  (!) 58  Resp:  18  Temp:  36.4 C  SpO2: (!) 84% 93%    Last Pain:  Vitals:   12/26/20 0933  TempSrc:   PainSc: [place]

## 2021-02-18 ENCOUNTER — Telehealth: Payer: Self-pay | Admitting: Medical Oncology

## 2021-02-18 NOTE — Telephone Encounter (Signed)
Pt wants to get his labs  ? today . They are scheduled for 05/10.

## 2021-02-19 ENCOUNTER — Telehealth: Payer: Self-pay | Admitting: Medical Oncology

## 2021-02-19 NOTE — Telephone Encounter (Signed)
Can he do a phone visit on wed?

## 2021-02-20 ENCOUNTER — Inpatient Hospital Stay: Payer: Medicare Other | Attending: Internal Medicine

## 2021-02-20 ENCOUNTER — Ambulatory Visit (HOSPITAL_COMMUNITY)
Admission: RE | Admit: 2021-02-20 | Discharge: 2021-02-20 | Disposition: A | Discharge status: Home / Self Care | Payer: Medicare Other | Source: Ambulatory Visit | Attending: Internal Medicine | Admitting: Internal Medicine

## 2021-02-20 ENCOUNTER — Other Ambulatory Visit: Payer: Self-pay

## 2021-02-20 DIAGNOSIS — C3490 Malignant neoplasm of unspecified part of unspecified bronchus or lung: Secondary | ICD-10-CM | POA: Diagnosis present

## 2021-02-20 DIAGNOSIS — Z85118 Personal history of other malignant neoplasm of bronchus and lung: Secondary | ICD-10-CM | POA: Insufficient documentation

## 2021-02-20 LAB — CBC WITH DIFFERENTIAL (CANCER CENTER ONLY)
Abs Immature Granulocytes: 0.02 10*3/uL (ref 0.00–0.07)
Basophils Absolute: 0.1 10*3/uL (ref 0.0–0.1)
Basophils Relative: 1 %
Eosinophils Absolute: 0.2 10*3/uL (ref 0.0–0.5)
Eosinophils Relative: 3 %
HCT: 38.9 % — ABNORMAL LOW (ref 39.0–52.0)
Hemoglobin: 12.7 g/dL — ABNORMAL LOW (ref 13.0–17.0)
Immature Granulocytes: 0 %
Lymphocytes Relative: 41 %
Lymphs Abs: 3.1 10*3/uL (ref 0.7–4.0)
MCH: 29.5 pg (ref 26.0–34.0)
MCHC: 32.6 g/dL (ref 30.0–36.0)
MCV: 90.5 fL (ref 80.0–100.0)
Monocytes Absolute: 0.5 10*3/uL (ref 0.1–1.0)
Monocytes Relative: 7 %
Neutro Abs: 3.5 10*3/uL (ref 1.7–7.7)
Neutrophils Relative %: 48 %
Platelet Count: 243 10*3/uL (ref 150–400)
RBC: 4.3 MIL/uL (ref 4.22–5.81)
RDW: 15.2 % (ref 11.5–15.5)
WBC Count: 7.4 10*3/uL (ref 4.0–10.5)
nRBC: 0 % (ref 0.0–0.2)

## 2021-02-20 LAB — CMP (CANCER CENTER ONLY)
ALT: 9 U/L (ref 0–44)
AST: 14 U/L — ABNORMAL LOW (ref 15–41)
Albumin: 3.8 g/dL (ref 3.5–5.0)
Alkaline Phosphatase: 72 U/L (ref 38–126)
Anion gap: 10 (ref 5–15)
BUN: 14 mg/dL (ref 8–23)
CO2: 29 mmol/L (ref 22–32)
Calcium: 8.9 mg/dL (ref 8.9–10.3)
Chloride: 100 mmol/L (ref 98–111)
Creatinine: 0.91 mg/dL (ref 0.61–1.24)
GFR, Estimated: >60 mL/min (ref >60)
Glucose, Bld: 91 mg/dL (ref 70–99)
Potassium: 4.7 mmol/L (ref 3.5–5.1)
Sodium: 139 mmol/L (ref 135–145)
Total Bilirubin: 0.4 mg/dL (ref 0.3–1.2)
Total Protein: 7 g/dL (ref 6.5–8.1)

## 2021-02-20 MED ORDER — IOHEXOL 300 MG/ML  SOLN
100.0000 mL | Freq: Once | INTRAMUSCULAR | Status: AC | PRN
  Administered 2021-02-20: 100 mL via INTRAVENOUS

## 2021-02-23 ENCOUNTER — Inpatient Hospital Stay: Payer: Medicare Other | Admitting: Internal Medicine

## 2021-02-24 ENCOUNTER — Telehealth: Payer: Self-pay | Admitting: Medical Oncology

## 2021-02-24 NOTE — Telephone Encounter (Signed)
Returned pt call but mailbox full. He is scheduled for phone visit tomorrow.

## 2021-02-25 ENCOUNTER — Other Ambulatory Visit: Payer: Self-pay

## 2021-02-25 ENCOUNTER — Inpatient Hospital Stay (HOSPITAL_BASED_OUTPATIENT_CLINIC_OR_DEPARTMENT_OTHER): Payer: Medicare Other | Admitting: Internal Medicine

## 2021-02-25 DIAGNOSIS — C3490 Malignant neoplasm of unspecified part of unspecified bronchus or lung: Secondary | ICD-10-CM | POA: Diagnosis not present

## 2021-02-25 DIAGNOSIS — C7800 Secondary malignant neoplasm of unspecified lung: Secondary | ICD-10-CM | POA: Diagnosis not present

## 2021-02-25 NOTE — Progress Notes (Signed)
[place] [phone]   Fax:([phone]) [phone]  PROGRESS NOTE FOR TELEMEDICINE VISITS  Laws, [name], MD [facility] [address] [place] [address]  I connected with@ on 02/25/21 at 11:15 AM EDT by telephone visit and verified that I am speaking with the correct person using two identifiers.   I discussed the limitations, risks, security and privacy concerns of performing an evaluation and management service by telemedicine and the availability of in-person appointments. I also discussed with the patient that there may be a patient responsible charge related to this service. The patient expressed understanding and agreed to proceed.  Other persons participating in the visit and their role in the encounter:  None  Patient's location:  Home Provider's location:  [place]  DIAGNOSIS: Stage IV (T3, N0, M1 a) non-small cell lung cancer, squamous cell carcinoma presented with multiple bilateral pulmonary nodules diagnosed in April 2018.  PRIOR THERAPY:   1) Systemic chemotherapy with carboplatin for AUC of 5 and paclitaxel 175 MG/M2 every 3 weeks with Neulasta support. Status post 6 cycles. 2) Second line treatment with immunotherapy with Nivolumab 480 mg IV every 4 weeks.  First cycle November 14, 2017.  Status post 23 cycles.  CURRENT THERAPY: Observation.   INTERVAL HISTORY: [name] [name] 70 y.o. male has a telephone virtual visit with me today for evaluation and discussion of his scan results.  The patient is feeling fine today with no concerning complaints.  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  He denied having any current chest pain, shortness of breath, cough or hemoptysis.  He denied having any nausea, vomiting, diarrhea or constipation.  He has no headache or visual changes.  He has no recent weight loss or night sweats.  He had repeat CT scan of the chest performed recently and we are having the visit for evaluation and discussion  of his scan results.  MEDICAL HISTORY: Past Medical History:  Diagnosis Date  . AAA (abdominal aortic aneurysm) ([place])    per CT in epic 10-17-2020 3.6cm  . Bilateral iliac artery aneurysm ([facility])    per CT in epic 10-17-2020  1.9cm  . Carotid occlusion, right    with infarction 2017  . Chronic diarrhea   . COPD (chronic obstructive pulmonary disease) ([place])    previously has seen pulmonolgy, dr Mamie Nick. mannan (lov 09-29-2018 in epic) ;   (12-19-2020  pt stated no oxygen, uses rescue inhaler as needed, and sob with stairs)  . Depression   . Diverticulosis of colon   . DOE (dyspnea on exertion)    per pt sob w/ stairs and recovers after few minutes  . Dyspnea   . GERD (gastroesophageal reflux disease)   . History of antineoplastic chemotherapy 01/2017  to 09/ 2019   lung cancer  . History of cerebrovascular accident (CVA) from right carotid artery occlusion involving right middle cerebral artery territory 2018     (last neurology note 11-14-2017 pt released on prn basis back to pcp follow up)   neurologist--- dr v. Leta Baptist--- left hand weakness/ numbing intermittantly in setting of lung cancer, MRI 12/ 2017 right frontal ishcemic infartions(watershed vs. embolic infarts due to right ICA stenosis or occlusion vs. cerebral metastateses) confirmed by duplex occluded right ICA  (03-04-20022 pt stated no residual)  . History of duodenal ulcer 02/2017   perforated pylori channel ulcer s/p surgery , modified graham's patch  . History of MRSA infection    remote  .  History of radiation therapy 08/2018   SRBT left upper lobe  . History of sepsis 05/07/2019   hospital admission pt dehydrated with AKI--- resolved  . Hypertension   . Left carotid stenosis    per duplex 40-59%  . Metastatic squamous cell carcinoma to lung, unspecified laterality [facility]) oncologist-- dr Mayme Genta   dx 04/ 2018 , bronchoscopy w/ bx's for bilateral pulm. nodules,  Stage 4 non-small cell ; completed chemo 09/ 2019 and had  immunotherapy and SBRT to left upper lobe 11/ 2019  . Peripheral neuropathy due to chemotherapy ([place])   . Right inguinal hernia   . Smokers' cough ([place])    per pt mostly productive in am   . Wears glasses     ALLERGIES:  has No Known Allergies.  MEDICATIONS:  Current Outpatient Medications  Medication Sig Dispense Refill  . acetaminophen (TYLENOL) 325 MG tablet You can take 2 every 4 hours as needed.  You can buy it over the counter DO NOT TAKE MORE THAN 4000 MG OF TYLENOL PER DAY.  IT CAN HARM YOUR LIVER.    Marland Kitchen albuterol (VENTOLIN HFA) 108 (90 Base) MCG/ACT inhaler Inhale into the lungs every 6 (six) hours as needed for wheezing or shortness of breath.    Marland Kitchen albuterol (VENTOLIN HFA) 108 (90 Base) MCG/ACT inhaler Inhale into the lungs every 6 (six) hours as needed for wheezing or shortness of breath.    Marland Kitchen amitriptyline (ELAVIL) 100 MG tablet Take 1 tablet (100 mg total) by mouth at bedtime. 30 tablet 3  . aspirin EC 81 MG tablet Take 81 mg by mouth daily.    Marland Kitchen gabapentin (NEURONTIN) 300 MG capsule TAKE 2 CAPSULES BY MOUTH THREE TIMES DAILY (Patient taking differently: Take 600 mg by mouth 3 (three) times daily.) 90 capsule 0  . lisinopril (ZESTRIL) 2.5 MG tablet Take 2.5 mg by mouth daily.    Marland Kitchen loperamide (IMODIUM) 2 MG capsule Take 1 capsule (2 mg total) by mouth 2 (two) times daily. Also available over the counter. You can take 1 extra dose in the afternoon if diarrhea is not improving. 60 capsule 4  . mesalamine (APRISO) 0.375 g 24 hr capsule Take 4 capsules by mouth daily. (Patient not taking: No sig reported)    . metoprolol succinate (TOPROL-XL) 25 MG 24 hr tablet Take 1 tablet (25 mg total) by mouth daily. 30 tablet 4  . mirtazapine (REMERON) 30 MG tablet TAKE 1 TABLET BY MOUTH AT BEDTIME (Patient taking differently: Take 30 mg by mouth at bedtime.) 30 tablet 0  . nortriptyline (PAMELOR) 10 MG capsule Take 10 mg by mouth 2 (two) times daily.    . Omega-3 Fatty Acids (OMEGA-3 FISH OIL  PO) Take 1 capsule by mouth daily.    Marland Kitchen omeprazole (PRILOSEC) 20 MG capsule Take 20 mg by mouth 2 (two) times daily before a meal.    . oxyCODONE (OXY IR/ROXICODONE) 5 MG immediate release tablet Take 1 tablet (5 mg total) by mouth every 6 (six) hours as needed for severe pain. 20 tablet 0  . simvastatin (ZOCOR) 20 MG tablet Take 20 mg by mouth daily.    . temazepam (RESTORIL) 30 MG capsule Take 1 capsule (30 mg total) by mouth at bedtime. (Patient taking differently: Take 30 mg by mouth at bedtime.) 30 capsule 0   No current facility-administered medications for this visit.    SURGICAL HISTORY:  Past Surgical History:  Procedure Laterality Date  . COLONOSCOPY    . INGUINAL  HERNIA REPAIR Right 12/25/2020   Procedure: RIGHT OPEN INGUINAL HERNIA REPAIR WITH MESH;  Surgeon: Kinsinger, Arta Bruce, MD;  Location: [facility];  Service: General;  Laterality: Right;  ROOM 3 STARTING AT 09:30AM FOR 1 HOUR  . LAPAROTOMY N/A 02/25/2017   Procedure: EXPLORATORY LAPAROTOMY MODIFIED GRAHAM'S PATCH;  Surgeon: Kinsinger, Arta Bruce, MD;  Location: [facility];  Service: General;  Laterality: N/A;  . MINOR PLACEMENT OF FIDUCIAL N/A 01/19/2017   Procedure: MINOR PLACEMENT OF FIDUCIAL x 6;  Surgeon: Collene Gobble, MD;  Location: [facility];  Service: Thoracic;  Laterality: N/A;  . PILONIDAL CYST EXCISION  2019  . VIDEO BRONCHOSCOPY WITH ENDOBRONCHIAL NAVIGATION N/A 01/19/2017   Procedure: VIDEO BRONCHOSCOPY WITH ENDOBRONCHIAL NAVIGATION;  Surgeon: Collene Gobble, MD;  Location: [facility];  Service: Thoracic;  Laterality: N/A;    REVIEW OF SYSTEMS:  A comprehensive review of systems was negative.    LABORATORY DATA: Lab Results  Component Value Date   WBC 7.4 02/20/2021   HGB 12.7 (L) 02/20/2021   HCT 38.9 (L) 02/20/2021   MCV 90.5 02/20/2021   PLT 243 02/20/2021      Chemistry      Component Value Date/Time   NA 139 02/20/2021 1210   NA 139 10/06/2017 1052   K 4.7 02/20/2021 1210   K 4.2  10/06/2017 1052   CL 100 02/20/2021 1210   CO2 29 02/20/2021 1210   CO2 25 10/06/2017 1052   BUN 14 02/20/2021 1210   BUN 13.2 10/06/2017 1052   CREATININE 0.91 02/20/2021 1210   CREATININE 0.9 10/06/2017 1052      Component Value Date/Time   CALCIUM 8.9 02/20/2021 1210   CALCIUM 9.5 10/06/2017 1052   ALKPHOS 72 02/20/2021 1210   ALKPHOS 68 10/06/2017 1052   AST 14 (L) 02/20/2021 1210   AST 14 10/06/2017 1052   ALT 9 02/20/2021 1210   ALT 13 10/06/2017 1052   BILITOT 0.4 02/20/2021 1210   BILITOT 0.44 10/06/2017 1052       RADIOGRAPHIC STUDIES: CT Chest W Contrast  Result Date: 02/22/2021 CLINICAL DATA:  Stage IV non-small cell bilateral upper lobe lung cancer diagnosed 2018 status post chemoradiation therapy and immunotherapy. Interval observation. Restaging. EXAM: CT CHEST, ABDOMEN, AND PELVIS WITH CONTRAST TECHNIQUE: Multidetector CT imaging of the chest, abdomen and pelvis was performed following the standard protocol during bolus administration of intravenous contrast. CONTRAST:  177mL OMNIPAQUE IOHEXOL 300 MG/ML  SOLN COMPARISON:  10/17/2020 CT chest, abdomen and pelvis. FINDINGS: CT CHEST FINDINGS Cardiovascular: Normal heart size. No significant pericardial effusion/thickening. Atherosclerotic thoracic aorta with dilated 4.4 cm ascending thoracic aorta, stable. Normal caliber pulmonary arteries. No central pulmonary emboli. Mediastinum/Nodes: No discrete thyroid nodules. Unremarkable esophagus. No pathologically enlarged axillary, mediastinal or hilar lymph nodes. Lungs/Pleura: No pneumothorax. No pleural effusion. Moderate centrilobular and paraseptal emphysema with diffuse bronchial wall thickening. Stable mild bandlike consolidation in left upper lobe with associated fiducial markers and mild distortion, compatible with radiation fibrosis. Stable fiducial markers scattered in the right upper lobe. Stable irregular 1.1 cm posterior right upper lobe pulmonary nodule (series 4/image  50). Stable sub solid central right upper lobe 0.8 cm nodule (series 4/image 86). Stable subpleural peripheral left lower lobe solid 0.4 cm pulmonary nodule (series 4/image 117). No acute consolidative airspace disease or new significant pulmonary nodules. Musculoskeletal: No aggressive appearing focal osseous lesions. Chronic healed deformities in the lateral left fourth and fifth ribs. Mild thoracic spondylosis. CT ABDOMEN PELVIS FINDINGS Hepatobiliary: Normal  liver with no liver mass. Normal gallbladder with no radiopaque cholelithiasis. No biliary ductal dilatation. Pancreas: Normal, with no mass or duct dilation. Spleen: Normal size. No mass. Adrenals/Urinary Tract: Stable adrenals with mild irregular bilateral adrenal thickening, left greater than right, without discrete adrenal nodules, favoring mild hyperplasia. No renal masses. No hydronephrosis. Chronic mild diffuse bladder wall thickening is unchanged. Stomach/Bowel: Normal non-distended stomach. Normal caliber small bowel with no small bowel wall thickening. Normal appendix. Oral contrast transits to the colon. Moderate left colonic diverticulosis with no large bowel wall thickening or significant pericolonic fat stranding. Vascular/Lymphatic: Atherosclerotic abdominal aorta with 3.7 cm infrarenal abdominal aortic aneurysm, stable. Patent portal, splenic, hepatic and renal veins. No pathologically enlarged lymph nodes in the abdomen or pelvis. Reproductive: Mild prostatomegaly. Other: No pneumoperitoneum, ascites or focal fluid collection. Musculoskeletal: No aggressive appearing focal osseous lesions. IMPRESSION: 1. Stable CT exam. Stable post treatment changes in the lungs with no local tumor recurrence. Stable bilateral pulmonary nodules. No evidence of recurrent metastatic disease in the chest, abdomen or pelvis. 2. Chronic findings include: Stable 4.4 cm ascending thoracic aortic aneurysm. 3.7 cm infrarenal abdominal aortic aneurysm. Recommend  follow-up ultrasound every 2 years. This recommendation follows ACR consensus guidelines: White Paper of the ACR Incidental Findings Committee II on Vascular Findings. J Am Coll Radiol 2013; 10:789-794. Moderate left colonic diverticulosis. Chronic mild diffuse bladder wall thickening probably due to chronic bladder outlet obstruction by the mildly enlarged prostate. Aortic Atherosclerosis (ICD10-I70.0) and Emphysema (ICD10-J43.9). Aortic aneurysm NOS (ICD10-I71.9). Electronically Signed   By: Ilona Sorrel M.D.   On: 02/22/2021 11:21   CT Abdomen Pelvis W Contrast  Result Date: 02/22/2021 CLINICAL DATA:  Stage IV non-small cell bilateral upper lobe lung cancer diagnosed 2018 status post chemoradiation therapy and immunotherapy. Interval observation. Restaging. EXAM: CT CHEST, ABDOMEN, AND PELVIS WITH CONTRAST TECHNIQUE: Multidetector CT imaging of the chest, abdomen and pelvis was performed following the standard protocol during bolus administration of intravenous contrast. CONTRAST:  124mL OMNIPAQUE IOHEXOL 300 MG/ML  SOLN COMPARISON:  10/17/2020 CT chest, abdomen and pelvis. FINDINGS: CT CHEST FINDINGS Cardiovascular: Normal heart size. No significant pericardial effusion/thickening. Atherosclerotic thoracic aorta with dilated 4.4 cm ascending thoracic aorta, stable. Normal caliber pulmonary arteries. No central pulmonary emboli. Mediastinum/Nodes: No discrete thyroid nodules. Unremarkable esophagus. No pathologically enlarged axillary, mediastinal or hilar lymph nodes. Lungs/Pleura: No pneumothorax. No pleural effusion. Moderate centrilobular and paraseptal emphysema with diffuse bronchial wall thickening. Stable mild bandlike consolidation in left upper lobe with associated fiducial markers and mild distortion, compatible with radiation fibrosis. Stable fiducial markers scattered in the right upper lobe. Stable irregular 1.1 cm posterior right upper lobe pulmonary nodule (series 4/image 50). Stable sub solid  central right upper lobe 0.8 cm nodule (series 4/image 86). Stable subpleural peripheral left lower lobe solid 0.4 cm pulmonary nodule (series 4/image 117). No acute consolidative airspace disease or new significant pulmonary nodules. Musculoskeletal: No aggressive appearing focal osseous lesions. Chronic healed deformities in the lateral left fourth and fifth ribs. Mild thoracic spondylosis. CT ABDOMEN PELVIS FINDINGS Hepatobiliary: Normal liver with no liver mass. Normal gallbladder with no radiopaque cholelithiasis. No biliary ductal dilatation. Pancreas: Normal, with no mass or duct dilation. Spleen: Normal size. No mass. Adrenals/Urinary Tract: Stable adrenals with mild irregular bilateral adrenal thickening, left greater than right, without discrete adrenal nodules, favoring mild hyperplasia. No renal masses. No hydronephrosis. Chronic mild diffuse bladder wall thickening is unchanged. Stomach/Bowel: Normal non-distended stomach. Normal caliber small bowel with no small bowel wall thickening. Normal  appendix. Oral contrast transits to the colon. Moderate left colonic diverticulosis with no large bowel wall thickening or significant pericolonic fat stranding. Vascular/Lymphatic: Atherosclerotic abdominal aorta with 3.7 cm infrarenal abdominal aortic aneurysm, stable. Patent portal, splenic, hepatic and renal veins. No pathologically enlarged lymph nodes in the abdomen or pelvis. Reproductive: Mild prostatomegaly. Other: No pneumoperitoneum, ascites or focal fluid collection. Musculoskeletal: No aggressive appearing focal osseous lesions. IMPRESSION: 1. Stable CT exam. Stable post treatment changes in the lungs with no local tumor recurrence. Stable bilateral pulmonary nodules. No evidence of recurrent metastatic disease in the chest, abdomen or pelvis. 2. Chronic findings include: Stable 4.4 cm ascending thoracic aortic aneurysm. 3.7 cm infrarenal abdominal aortic aneurysm. Recommend follow-up ultrasound every  2 years. This recommendation follows ACR consensus guidelines: White Paper of the ACR Incidental Findings Committee II on Vascular Findings. J Am Coll Radiol 2013; 10:789-794. Moderate left colonic diverticulosis. Chronic mild diffuse bladder wall thickening probably due to chronic bladder outlet obstruction by the mildly enlarged prostate. Aortic Atherosclerosis (ICD10-I70.0) and Emphysema (ICD10-J43.9). Aortic aneurysm NOS (ICD10-I71.9). Electronically Signed   By: Ilona Sorrel M.D.   On: 02/22/2021 11:21    ASSESSMENT AND PLAN: This is a very pleasant 70 years old white male with stage IV non-small cell lung cancer, squamous cell carcinoma presented with multiple bilateral pulmonary nodules. The patient was treated with systemic chemotherapy with carboplatin and paclitaxel status post 6 cycles. He tolerated the last cycle of his treatment well except for the persistent peripheral neuropathy. The patient has been in observation for the last 3 months. He had a repeat CT scan of the chest, abdomen and pelvis performed recently.  Has a scan showed mild interval enlargement of left upper lobe as well as right upper lobe pulmonary nodules concerning for disease progression. The patient was started on treatment with immunotherapy with Nivolumab 480 mg IV every 4 weeks status post 28 cycles.  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  He has been in observation now for more than 10 months The patient is feeling fine today with no concerning complaints. He had repeat CT scan of the chest, abdomen pelvis performed recently.  I personally and independently reviewed the scans and discussed the results with the patient today. Has a scan showed no concerning findings for disease progression. I recommended for him to continue on observation with repeat CT scan of the chest in 6 months. He was advised to call immediately if he has any other concerning symptoms  in the interval. I discussed the assessment and treatment plan with the patient. The patient was provided an opportunity to ask questions and all were answered. The patient agreed with the plan and demonstrated an understanding of the instructions.   The patient was advised to call back or seek an in-person evaluation if the symptoms worsen or if the condition fails to improve as anticipated.  I provided 13 minutes of non face-to-face telephone visit time during this encounter, and > 50% was spent counseling as documented under my assessment & plan.  Eilleen Kempf, MD 02/25/2021 11:23 AM  Disclaimer: This note was dictated with voice recognition software. Similar sounding words can inadvertently be transcribed and may not be corrected upon review.

## 2021-02-26 ENCOUNTER — Telehealth: Payer: Self-pay | Admitting: Internal Medicine

## 2021-02-26 NOTE — Telephone Encounter (Signed)
Scheduled per los. Called and left msg. Mailed printout  °

## 2021-08-28 ENCOUNTER — Other Ambulatory Visit: Payer: Medicare Other

## 2021-08-31 ENCOUNTER — Inpatient Hospital Stay: Payer: Medicare Other

## 2021-08-31 ENCOUNTER — Other Ambulatory Visit: Payer: Self-pay

## 2021-08-31 ENCOUNTER — Ambulatory Visit (HOSPITAL_COMMUNITY)
Admission: RE | Admit: 2021-08-31 | Discharge: 2021-08-31 | Disposition: A | Discharge status: Home / Self Care | Payer: Medicare Other | Source: Ambulatory Visit | Attending: Internal Medicine | Admitting: Internal Medicine

## 2021-08-31 ENCOUNTER — Inpatient Hospital Stay: Payer: Medicare Other | Attending: Internal Medicine | Admitting: Internal Medicine

## 2021-08-31 ENCOUNTER — Encounter (HOSPITAL_COMMUNITY): Payer: Self-pay

## 2021-08-31 VITALS — BP 174/111 | HR 89 | Temp 98.6°F | Resp 19 | Ht 68.0 in | Wt 154.2 lb

## 2021-08-31 DIAGNOSIS — C7931 Secondary malignant neoplasm of brain: Secondary | ICD-10-CM

## 2021-08-31 DIAGNOSIS — Z85118 Personal history of other malignant neoplasm of bronchus and lung: Secondary | ICD-10-CM | POA: Diagnosis not present

## 2021-08-31 DIAGNOSIS — G629 Polyneuropathy, unspecified: Secondary | ICD-10-CM | POA: Diagnosis not present

## 2021-08-31 DIAGNOSIS — C7800 Secondary malignant neoplasm of unspecified lung: Secondary | ICD-10-CM

## 2021-08-31 DIAGNOSIS — C3490 Malignant neoplasm of unspecified part of unspecified bronchus or lung: Secondary | ICD-10-CM

## 2021-08-31 LAB — CBC WITH DIFFERENTIAL (CANCER CENTER ONLY)
Abs Immature Granulocytes: 0.03 10*3/uL (ref 0.00–0.07)
Basophils Absolute: 0.1 10*3/uL (ref 0.0–0.1)
Basophils Relative: 1 %
Eosinophils Absolute: 0.1 10*3/uL (ref 0.0–0.5)
Eosinophils Relative: 1 %
HCT: 41.6 % (ref 39.0–52.0)
Hemoglobin: 14.3 g/dL (ref 13.0–17.0)
Immature Granulocytes: 0 %
Lymphocytes Relative: 22 %
Lymphs Abs: 2 10*3/uL (ref 0.7–4.0)
MCH: 30.6 pg (ref 26.0–34.0)
MCHC: 34.4 g/dL (ref 30.0–36.0)
MCV: 89.1 fL (ref 80.0–100.0)
Monocytes Absolute: 0.6 10*3/uL (ref 0.1–1.0)
Monocytes Relative: 7 %
Neutro Abs: 6.3 10*3/uL (ref 1.7–7.7)
Neutrophils Relative %: 69 %
Platelet Count: 215 10*3/uL (ref 150–400)
RBC: 4.67 MIL/uL (ref 4.22–5.81)
RDW: 12.8 % (ref 11.5–15.5)
WBC Count: 9.1 10*3/uL (ref 4.0–10.5)
nRBC: 0 % (ref 0.0–0.2)

## 2021-08-31 LAB — CMP (CANCER CENTER ONLY)
ALT: 10 U/L (ref 0–44)
AST: 14 U/L — ABNORMAL LOW (ref 15–41)
Albumin: 4.1 g/dL (ref 3.5–5.0)
Alkaline Phosphatase: 74 U/L (ref 38–126)
Anion gap: 9 (ref 5–15)
BUN: 10 mg/dL (ref 8–23)
CO2: 26 mmol/L (ref 22–32)
Calcium: 9 mg/dL (ref 8.9–10.3)
Chloride: 102 mmol/L (ref 98–111)
Creatinine: 0.78 mg/dL (ref 0.61–1.24)
GFR, Estimated: >60 mL/min (ref >60)
Glucose, Bld: 98 mg/dL (ref 70–99)
Potassium: 4 mmol/L (ref 3.5–5.1)
Sodium: 137 mmol/L (ref 135–145)
Total Bilirubin: 0.6 mg/dL (ref 0.3–1.2)
Total Protein: 7.3 g/dL (ref 6.5–8.1)

## 2021-08-31 MED ORDER — IOHEXOL 350 MG/ML SOLN
80.0000 mL | Freq: Once | INTRAVENOUS | Status: AC | PRN
  Administered 2021-08-31: 80 mL via INTRAVENOUS

## 2021-08-31 NOTE — Progress Notes (Signed)
[facility] [phone]   [phone]  OFFICE PROGRESS NOTE  Laws, [name], MD [facility] [address] [place] [address]  DIAGNOSIS: Stage IV (T3, N0, M1 a) non-small cell lung cancer, squamous cell carcinoma presented with multiple bilateral pulmonary nodules diagnosed in April 2018.  PRIOR THERAPY:   1) Systemic chemotherapy with carboplatin for AUC of 5 and paclitaxel 175 MG/M2 every 3 weeks with Neulasta support. Status post 6 cycles. 2) Second line treatment with immunotherapy with Nivolumab 480 mg IV every 4 weeks.  First cycle November 14, 2017.  Status post 23 cycles.  CURRENT THERAPY: Observation.  INTERVAL HISTORY: [name] [name] 70 y.o. male returns to the clinic today for 95-month follow-up visit.  The patient is feeling fine today with no concerning complaints.  He has been dealing and taken care of his girlfriend who had a stroke recently. The patient denied having any current chest pain, shortness of breath, cough or hemoptysis.  He denied having any fever or chills.  He has no nausea, vomiting, diarrhea or constipation.  He has no headache or visual changes.  He has no significant weight loss or night sweats.  He is here today for evaluation with repeat CT scan of the chest, abdomen and pelvis for restaging of his disease.  MEDICAL HISTORY: Past Medical History:  Diagnosis Date   AAA (abdominal aortic aneurysm)    per CT in epic 10-17-2020 3.6cm   Bilateral iliac artery aneurysm (HCC)    per CT in epic 10-17-2020  1.9cm   Carotid occlusion, right    with infarction 2017   Chronic diarrhea    COPD (chronic obstructive pulmonary disease) ([place])    previously has seen pulmonolgy, dr Mamie Nick. mannan (lov 09-29-2018 in epic) ;   (12-19-2020  pt stated no oxygen, uses rescue inhaler as needed, and sob with stairs)   Depression    Diverticulosis of colon    DOE (dyspnea on exertion)    per pt sob w/ stairs and recovers after few minutes   Dyspnea     GERD (gastroesophageal reflux disease)    History of antineoplastic chemotherapy 01/2017  to 09/ 2019   lung cancer   History of cerebrovascular accident (CVA) from right carotid artery occlusion involving right middle cerebral artery territory 2018     (last neurology note 11-14-2017 pt released on prn basis back to pcp follow up)   neurologist--- dr v. Leta Baptist--- left hand weakness/ numbing intermittantly in setting of lung cancer, MRI 12/ 2017 right frontal ishcemic infartions(watershed vs. embolic infarts due to right ICA stenosis or occlusion vs. cerebral metastateses) confirmed by duplex occluded right ICA  (03-04-20022 pt stated no residual)   History of duodenal ulcer 02/2017   perforated pylori channel ulcer s/p surgery , modified graham's patch   History of MRSA infection    remote   History of radiation therapy 08/2018   SRBT left upper lobe   History of sepsis 05/07/2019   hospital admission pt dehydrated with AKI--- resolved   Hypertension    Left carotid stenosis    per duplex 40-59%   Metastatic squamous cell carcinoma to lung, unspecified laterality [facility]) oncologist-- dr Mayme Genta   dx 04/ 2018 , bronchoscopy w/ bx's for bilateral pulm. nodules,  Stage 4 non-small cell ; completed chemo 09/ 2019 and had immunotherapy and SBRT to left upper lobe 11/ 2019   Peripheral neuropathy due to chemotherapy [facility])    Right  inguinal hernia    Smokers' cough ([place])    per pt mostly productive in am    Wears glasses     ALLERGIES:  has No Known Allergies.  MEDICATIONS:  Current Outpatient Medications  Medication Sig Dispense Refill   acetaminophen (TYLENOL) 325 MG tablet You can take 2 every 4 hours as needed.  You can buy it over the counter DO NOT TAKE MORE THAN 4000 MG OF TYLENOL PER DAY.  IT CAN HARM YOUR LIVER.     albuterol (VENTOLIN HFA) 108 (90 Base) MCG/ACT inhaler Inhale into the lungs every 6 (six) hours as needed for wheezing or shortness of breath.     albuterol  (VENTOLIN HFA) 108 (90 Base) MCG/ACT inhaler Inhale into the lungs every 6 (six) hours as needed for wheezing or shortness of breath.     amitriptyline (ELAVIL) 100 MG tablet Take 1 tablet (100 mg total) by mouth at bedtime. 30 tablet 3   aspirin EC 81 MG tablet Take 81 mg by mouth daily.     gabapentin (NEURONTIN) 300 MG capsule TAKE 2 CAPSULES BY MOUTH THREE TIMES DAILY (Patient taking differently: Take 600 mg by mouth 3 (three) times daily.) 90 capsule 0   lisinopril (ZESTRIL) 2.5 MG tablet Take 2.5 mg by mouth daily.     loperamide (IMODIUM) 2 MG capsule Take 1 capsule (2 mg total) by mouth 2 (two) times daily. Also available over the counter. You can take 1 extra dose in the afternoon if diarrhea is not improving. 60 capsule 4   mesalamine (APRISO) 0.375 g 24 hr capsule Take 4 capsules by mouth daily. (Patient not taking: No sig reported)     metoprolol succinate (TOPROL-XL) 25 MG 24 hr tablet Take 1 tablet (25 mg total) by mouth daily. 30 tablet 4   mirtazapine (REMERON) 30 MG tablet TAKE 1 TABLET BY MOUTH AT BEDTIME (Patient taking differently: Take 30 mg by mouth at bedtime.) 30 tablet 0   nortriptyline (PAMELOR) 10 MG capsule Take 10 mg by mouth 2 (two) times daily.     Omega-3 Fatty Acids (OMEGA-3 FISH OIL PO) Take 1 capsule by mouth daily.     omeprazole (PRILOSEC) 20 MG capsule Take 20 mg by mouth 2 (two) times daily before a meal.     oxyCODONE (OXY IR/ROXICODONE) 5 MG immediate release tablet Take 1 tablet (5 mg total) by mouth every 6 (six) hours as needed for severe pain. 20 tablet 0   simvastatin (ZOCOR) 20 MG tablet Take 20 mg by mouth daily.     temazepam (RESTORIL) 30 MG capsule Take 1 capsule (30 mg total) by mouth at bedtime. (Patient taking differently: Take 30 mg by mouth at bedtime.) 30 capsule 0   No current facility-administered medications for this visit.    SURGICAL HISTORY:  Past Surgical History:  Procedure Laterality Date   COLONOSCOPY     INGUINAL HERNIA REPAIR  Right 12/25/2020   Procedure: RIGHT OPEN INGUINAL HERNIA REPAIR WITH MESH;  Surgeon: Kinsinger, Arta Bruce, MD;  Location: [facility];  Service: General;  Laterality: Right;  ROOM 3 STARTING AT 09:30AM FOR 1 HOUR   LAPAROTOMY N/A 02/25/2017   Procedure: EXPLORATORY LAPAROTOMY MODIFIED GRAHAM'S PATCH;  Surgeon: Mickeal Skinner, MD;  Location: [facility];  Service: General;  Laterality: N/A;   MINOR PLACEMENT OF FIDUCIAL N/A 01/19/2017   Procedure: MINOR PLACEMENT OF FIDUCIAL x 6;  Surgeon: Collene Gobble, MD;  Location: [facility];  Service: Thoracic;  Laterality:  N/A;   PILONIDAL CYST EXCISION  2019   VIDEO BRONCHOSCOPY WITH ENDOBRONCHIAL NAVIGATION N/A 01/19/2017   Procedure: VIDEO BRONCHOSCOPY WITH ENDOBRONCHIAL NAVIGATION;  Surgeon: Collene Gobble, MD;  Location: [facility];  Service: Thoracic;  Laterality: N/A;    REVIEW OF SYSTEMS:  Constitutional: negative Eyes: negative Ears, nose, mouth, throat, and face: negative Respiratory: negative Cardiovascular: negative Gastrointestinal: negative Genitourinary:negative Integument/breast: negative Hematologic/lymphatic: negative Musculoskeletal:negative Neurological: negative Behavioral/Psych: negative Endocrine: negative Allergic/Immunologic: negative   PHYSICAL EXAMINATION: General appearance: alert, cooperative, and no distress Head: Normocephalic, without obvious abnormality, atraumatic Neck: no adenopathy, no JVD, supple, symmetrical, trachea midline, and thyroid not enlarged, symmetric, no tenderness/mass/nodules Lymph nodes: Cervical, supraclavicular, and axillary nodes normal. Resp: clear to auscultation bilaterally Back: symmetric, no curvature. ROM normal. No CVA tenderness. Cardio: regular rate and rhythm, S1, S2 normal, no murmur, click, rub or gallop GI: soft, non-tender; bowel sounds normal; no masses,  no organomegaly Extremities: extremities normal, atraumatic, no cyanosis or edema Neurologic: Alert and oriented X 3,  normal strength and tone. Normal symmetric reflexes. Normal coordination and gait  ECOG PERFORMANCE STATUS: 1 - Symptomatic but completely ambulatory   Blood pressure (!) 174/111, pulse 89, temperature 98.6 F (37 C), temperature source Tympanic, resp. rate 19, height 5\' 8"  (1.727 m), weight 154 lb 3.2 oz (69.9 kg), SpO2 95 %.  LABORATORY DATA: Lab Results  Component Value Date   WBC 9.1 08/31/2021   HGB 14.3 08/31/2021   HCT 41.6 08/31/2021   MCV 89.1 08/31/2021   PLT 215 08/31/2021      Chemistry      Component Value Date/Time   NA 137 08/31/2021 1145   NA 139 10/06/2017 1052   K 4.0 08/31/2021 1145   K 4.2 10/06/2017 1052   CL 102 08/31/2021 1145   CO2 26 08/31/2021 1145   CO2 25 10/06/2017 1052   BUN 10 08/31/2021 1145   BUN 13.2 10/06/2017 1052   CREATININE 0.78 08/31/2021 1145   CREATININE 0.9 10/06/2017 1052      Component Value Date/Time   CALCIUM 9.0 08/31/2021 1145   CALCIUM 9.5 10/06/2017 1052   ALKPHOS 74 08/31/2021 1145   ALKPHOS 68 10/06/2017 1052   AST 14 (L) 08/31/2021 1145   AST 14 10/06/2017 1052   ALT 10 08/31/2021 1145   ALT 13 10/06/2017 1052   BILITOT 0.6 08/31/2021 1145   BILITOT 0.44 10/06/2017 1052       RADIOGRAPHIC STUDIES: No results found.   ASSESSMENT AND PLAN:  This is a very pleasant 70 years old white male with stage IV non-small cell lung cancer, squamous cell carcinoma presented with multiple bilateral pulmonary nodules. The patient was treated with systemic chemotherapy with carboplatin and paclitaxel status post 6 cycles. He tolerated the last cycle of his treatment well except for the persistent peripheral neuropathy. The patient has been in observation for the last 3 months. He had a repeat CT scan of the chest, abdomen and pelvis performed recently.  Has a scan showed mild interval enlargement of left upper lobe as well as right upper lobe pulmonary nodules concerning for disease progression. The patient was started on  treatment with immunotherapy with Nivolumab 480 mg IV every 4 weeks status post 28 cycles.  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  He has been in observation now for more than 16 months.  The patient is feeling fine with no concerning complaints. The patient is currently on observation and has been  feeling fine with no concerning issues recently. He had repeat CT scan of the chest, abdomen pelvis performed recently.  I personally and independently reviewed the scan images and discussed the results with the patient today.  I did not see any significant change from the previous scan but I would wait for the final report for confirmation.  The final report showed no concerning findings for disease progression or metastasis, I will see him back for follow-up visit in 6 months with repeat CT scan of the chest. For the smoking cessation I strongly encouraged the patient to quit smoking. For the peripheral neuropathy he will continue his current treatment with Neurontin 600 mg p.o. 3 times daily. The patient was advised to call immediately if he has any other concerning symptoms in the interval.  The patient voices understanding of current disease status and treatment options and is in agreement with the current care plan. All questions were answered. The patient knows to call the clinic with any problems, questions or concerns. We can certainly see the patient much sooner if necessary.  Disclaimer: This note was dictated with voice recognition software. Similar sounding words can inadvertently be transcribed and may not be corrected upon review.

## 2021-09-01 ENCOUNTER — Telehealth: Payer: Self-pay | Admitting: Internal Medicine

## 2021-09-01 NOTE — Telephone Encounter (Signed)
Left message with follow-up appointments per 11/14 los.

## 2022-02-22 ENCOUNTER — Encounter: Payer: Self-pay | Admitting: Internal Medicine

## 2022-03-01 ENCOUNTER — Inpatient Hospital Stay: Payer: Medicare PPO

## 2022-03-02 ENCOUNTER — Ambulatory Visit (HOSPITAL_COMMUNITY): Payer: Medicare PPO

## 2022-03-03 ENCOUNTER — Inpatient Hospital Stay: Payer: Medicare PPO | Admitting: Internal Medicine

## 2022-03-10 ENCOUNTER — Telehealth: Payer: Self-pay | Admitting: Internal Medicine

## 2022-03-10 NOTE — Telephone Encounter (Signed)
Called pt to schedule per 5/22 inbasket, pt stated he needed to check schedule for availability and that he will call when he is ready to sch appt. MD AND PA notified

## 2022-03-23 ENCOUNTER — Ambulatory Visit (HOSPITAL_COMMUNITY)
Admission: RE | Admit: 2022-03-23 | Discharge: 2022-03-23 | Disposition: A | Discharge status: Home / Self Care | Payer: Medicare PPO | Source: Ambulatory Visit | Attending: Internal Medicine | Admitting: Internal Medicine

## 2022-03-23 DIAGNOSIS — C3490 Malignant neoplasm of unspecified part of unspecified bronchus or lung: Secondary | ICD-10-CM | POA: Diagnosis not present

## 2022-03-23 DIAGNOSIS — C7800 Secondary malignant neoplasm of unspecified lung: Secondary | ICD-10-CM | POA: Insufficient documentation

## 2022-03-23 LAB — POCT I-STAT CREATININE: Creatinine, Ser: 1 mg/dL (ref 0.61–1.24)

## 2022-03-23 MED ORDER — SODIUM CHLORIDE (PF) 0.9 % IJ SOLN
INTRAMUSCULAR | Status: AC
  Filled 2022-03-23: qty 50

## 2022-03-23 MED ORDER — IOHEXOL 300 MG/ML  SOLN
100.0000 mL | Freq: Once | INTRAMUSCULAR | Status: AC | PRN
  Administered 2022-03-23: 100 mL via INTRAVENOUS

## 2022-03-25 ENCOUNTER — Telehealth: Payer: Self-pay | Admitting: Physician Assistant

## 2022-03-25 NOTE — Telephone Encounter (Signed)
.  Called patient to schedule appointment per 6/7 staff msg, patient is aware of date and time.

## 2022-03-29 ENCOUNTER — Other Ambulatory Visit: Payer: Medicare PPO

## 2022-03-29 ENCOUNTER — Ambulatory Visit: Payer: Medicare PPO | Admitting: Physician Assistant

## 2022-03-29 NOTE — Progress Notes (Signed)
[name] [name] HEMATOLOGY-ONCOLOGY TeleHEALTH VISIT PROGRESS NOTE   I connected with [name] [name] on 04/01/22 at 10:30 AM EDT by telephone and verified that I am speaking with the correct person using two identifiers.  I discussed the limitations, risks, security and privacy concerns of performing an evaluation and management service by telemedicine and the availability of in-person appointments. I also discussed with the patient that there may be a patient responsible charge related to this service. The patient expressed understanding and agreed to proceed.  Other persons participating in the visit and their role in the encounter: None  Patient's location: Home  Provider's location: Office  Laws, [name], [place] [address]  DIAGNOSIS: Stage IV (T3, N0, M1 a) non-small cell lung cancer, squamous cell carcinoma presented with multiple bilateral pulmonary nodules diagnosed in April 2018.  PRIOR THERAPY: 1) Systemic chemotherapy with carboplatin for AUC of 5 and paclitaxel 175 MG/M2 every 3 weeks with Neulasta support. Status post 6 cycles. 2) Second line treatment with immunotherapy was Nivolumab 480 mg IV every 4 weeks.  First cycle November 14, 2017.  Status post 28 cycles. The patient opted to take a break from treatment.  CURRENT THERAPY: Observation  INTERVAL HISTORY: [name] [name] 71 y.o. male and I connected via telephone visit today. The purpose of appointment is a 6 month follow up visit. The patient is feeling fair today without any concerning complaints except for the usual dyspnea on exertion secondary to smoking. He continues to smoke. He has moderate emphysema noted on imaging studies. He also continues to have some neuropathy this is stable. He mentions he saw a doctor a few days ago for some heel pain. He states they told him this was secondary to ligament issues. He has been using heating pads. He has been on observation since 2021 per  patient request and missing several appointments. The patient denies any fever, chills, night sweats, or weight loss. He did denies any chest pain or hemoptysis. He reports a baseline cough which produces phlegm. He reports his baseline dyspnea on exertion.  He denies any nausea, vomiting, diarrhea, or constipation. He denies any headache or visual changes.  The patient recently had a restaging CT scan of the chest, abdomen, and pelvis performed. We are reviewing his results with him today.   MEDICAL HISTORY: Past Medical History:  Diagnosis Date   AAA (abdominal aortic aneurysm)    per CT in epic 10-17-2020 3.6cm   Bilateral iliac artery aneurysm (HCC)    per CT in epic 10-17-2020  1.9cm   Carotid occlusion, right    with infarction 2017   Chronic diarrhea    COPD (chronic obstructive pulmonary disease) ([place])    previously has seen pulmonolgy, dr Mamie Nick. mannan (lov 09-29-2018 in epic) ;   (12-19-2020  pt stated no oxygen, uses rescue inhaler as needed, and sob with stairs)   Depression    Diverticulosis of colon    DOE (dyspnea on exertion)    per pt sob w/ stairs and recovers after few minutes   Dyspnea    GERD (gastroesophageal reflux disease)    History of antineoplastic chemotherapy 01/2017  to 09/ 2019   lung cancer   History of cerebrovascular accident (CVA) from right carotid artery occlusion involving right middle cerebral artery territory 2018     (last neurology note 11-14-2017 pt released on prn basis back to pcp follow up)   neurologist--- dr v. Leta Baptist--- left  hand weakness/ numbing intermittantly in setting of lung cancer, MRI 12/ 2017 right frontal ishcemic infartions(watershed vs. embolic infarts due to right ICA stenosis or occlusion vs. cerebral metastateses) confirmed by duplex occluded right ICA  (03-04-20022 pt stated no residual)   History of duodenal ulcer 02/2017   perforated pylori channel ulcer s/p surgery , modified graham's patch   History of MRSA infection     remote   History of radiation therapy 08/2018   SRBT left upper lobe   History of sepsis 05/07/2019   hospital admission pt dehydrated with AKI--- resolved   Hypertension    Left carotid stenosis    per duplex 40-59%   Metastatic squamous cell carcinoma to lung, unspecified laterality [facility]) oncologist-- dr Mayme Genta   dx 04/ 2018 , bronchoscopy w/ bx's for bilateral pulm. nodules,  Stage 4 non-small cell ; completed chemo 09/ 2019 and had immunotherapy and SBRT to left upper lobe 11/ 2019   Peripheral neuropathy due to chemotherapy [facility])    Right inguinal hernia    Smokers' cough ([place])    per pt mostly productive in am    Wears glasses     ALLERGIES:  has No Known Allergies.  MEDICATIONS:  Current Outpatient Medications  Medication Sig Dispense Refill   acetaminophen (TYLENOL) 325 MG tablet You can take 2 every 4 hours as needed.  You can buy it over the counter DO NOT TAKE MORE THAN 4000 MG OF TYLENOL PER DAY.  IT CAN HARM YOUR LIVER.     albuterol (VENTOLIN HFA) 108 (90 Base) MCG/ACT inhaler Inhale into the lungs every 6 (six) hours as needed for wheezing or shortness of breath.     albuterol (VENTOLIN HFA) 108 (90 Base) MCG/ACT inhaler Inhale into the lungs every 6 (six) hours as needed for wheezing or shortness of breath.     amitriptyline (ELAVIL) 100 MG tablet Take 1 tablet (100 mg total) by mouth at bedtime. 30 tablet 3   aspirin EC 81 MG tablet Take 81 mg by mouth daily.     gabapentin (NEURONTIN) 300 MG capsule TAKE 2 CAPSULES BY MOUTH THREE TIMES DAILY (Patient taking differently: Take 600 mg by mouth 3 (three) times daily.) 90 capsule 0   lisinopril (ZESTRIL) 2.5 MG tablet Take 2.5 mg by mouth daily.     loperamide (IMODIUM) 2 MG capsule Take 1 capsule (2 mg total) by mouth 2 (two) times daily. Also available over the counter. You can take 1 extra dose in the afternoon if diarrhea is not improving. 60 capsule 4   mesalamine (APRISO) 0.375 g 24 hr capsule Take 4 capsules by  mouth daily. (Patient not taking: No sig reported)     metoprolol succinate (TOPROL-XL) 25 MG 24 hr tablet Take 1 tablet (25 mg total) by mouth daily. 30 tablet 4   mirtazapine (REMERON) 30 MG tablet TAKE 1 TABLET BY MOUTH AT BEDTIME (Patient taking differently: Take 30 mg by mouth at bedtime.) 30 tablet 0   nortriptyline (PAMELOR) 10 MG capsule Take 10 mg by mouth 2 (two) times daily.     Omega-3 Fatty Acids (OMEGA-3 FISH OIL PO) Take 1 capsule by mouth daily.     omeprazole (PRILOSEC) 20 MG capsule Take 20 mg by mouth 2 (two) times daily before a meal.     oxyCODONE (OXY IR/ROXICODONE) 5 MG immediate release tablet Take 1 tablet (5 mg total) by mouth every 6 (six) hours as needed for severe pain. 20 tablet 0   simvastatin (  ZOCOR) 20 MG tablet Take 20 mg by mouth daily.     temazepam (RESTORIL) 30 MG capsule Take 1 capsule (30 mg total) by mouth at bedtime. (Patient taking differently: Take 30 mg by mouth at bedtime.) 30 capsule 0   No current facility-administered medications for this visit.    SURGICAL HISTORY:  Past Surgical History:  Procedure Laterality Date   COLONOSCOPY     INGUINAL HERNIA REPAIR Right 12/25/2020   Procedure: RIGHT OPEN INGUINAL HERNIA REPAIR WITH MESH;  Surgeon: Kinsinger, Arta Bruce, MD;  Location: [facility];  Service: General;  Laterality: Right;  ROOM 3 STARTING AT 09:30AM FOR 1 HOUR   LAPAROTOMY N/A 02/25/2017   Procedure: EXPLORATORY LAPAROTOMY MODIFIED GRAHAM'S PATCH;  Surgeon: Mickeal Skinner, MD;  Location: [facility];  Service: General;  Laterality: N/A;   MINOR PLACEMENT OF FIDUCIAL N/A 01/19/2017   Procedure: MINOR PLACEMENT OF FIDUCIAL x 6;  Surgeon: Collene Gobble, MD;  Location: [facility];  Service: Thoracic;  Laterality: N/A;   PILONIDAL CYST EXCISION  2019   VIDEO BRONCHOSCOPY WITH ENDOBRONCHIAL NAVIGATION N/A 01/19/2017   Procedure: VIDEO BRONCHOSCOPY WITH ENDOBRONCHIAL NAVIGATION;  Surgeon: Collene Gobble, MD;  Location: [facility];  Service:  Thoracic;  Laterality: N/A;    REVIEW OF SYSTEMS:   Review of Systems  Constitutional: Positive for baseline fatigue.  Negative for appetite change, chills, fatigue, fever and unexpected weight change.  HENT:   Negative for mouth sores, nosebleeds, sore throat and trouble swallowing.   Eyes: Negative for eye problems and icterus.  Respiratory: Positive for dyspnea on exertion.  Today for stable baseline cough.  Negative for hemoptysis and wheezing.   Cardiovascular: Negative for chest pain and leg swelling.  Gastrointestinal: Negative for abdominal pain, constipation, diarrhea, nausea and vomiting.  Genitourinary: Negative for bladder incontinence, difficulty urinating, dysuria, frequency and hematuria.   Musculoskeletal: Positive for heel pain.  Negative for back pain, gait problem, neck pain and neck stiffness.  Skin: Negative for itching and rash.  Neurological: Positive for neuropathy.  Negative for dizziness, extremity weakness, gait problem, headaches, light-headedness and seizures.  Hematological: Negative for adenopathy. Does not bruise/bleed easily.  Psychiatric/Behavioral: Negative for confusion, depression and sleep disturbance. The patient is not nervous/anxious.     PHYSICAL EXAMINATION:  There were no vitals taken for this visit.  ECOG PERFORMANCE STATUS: 1  Physical Exam  Psychiatric: Mood, memory and judgment normal.  Vitals reviewed.  LABORATORY DATA: Lab Results  Component Value Date   WBC 9.1 08/31/2021   HGB 14.3 08/31/2021   HCT 41.6 08/31/2021   MCV 89.1 08/31/2021   PLT 215 08/31/2021      Chemistry      Component Value Date/Time   NA 137 08/31/2021 1145   NA 139 10/06/2017 1052   K 4.0 08/31/2021 1145   K 4.2 10/06/2017 1052   CL 102 08/31/2021 1145   CO2 26 08/31/2021 1145   CO2 25 10/06/2017 1052   BUN 10 08/31/2021 1145   BUN 13.2 10/06/2017 1052   CREATININE 1.00 03/23/2022 1151   CREATININE 0.78 08/31/2021 1145   CREATININE 0.9  10/06/2017 1052      Component Value Date/Time   CALCIUM 9.0 08/31/2021 1145   CALCIUM 9.5 10/06/2017 1052   ALKPHOS 74 08/31/2021 1145   ALKPHOS 68 10/06/2017 1052   AST 14 (L) 08/31/2021 1145   AST 14 10/06/2017 1052   ALT 10 08/31/2021 1145   ALT 13 10/06/2017 1052   BILITOT  0.6 08/31/2021 1145   BILITOT 0.44 10/06/2017 1052       RADIOGRAPHIC STUDIES:  CT Chest W Contrast  Result Date: 03/24/2022 CLINICAL DATA:  Metastatic lung cancer restaging * Tracking Code: BO * EXAM: CT CHEST, ABDOMEN, AND PELVIS WITH CONTRAST TECHNIQUE: Multidetector CT imaging of the chest, abdomen and pelvis was performed following the standard protocol during bolus administration of intravenous contrast. RADIATION DOSE REDUCTION: This exam was performed according to the departmental dose-optimization program which includes automated exposure control, adjustment of the mA and/or kV according to patient size and/or use of iterative reconstruction technique. CONTRAST:  116mL OMNIPAQUE IOHEXOL 300 MG/ML SOLN, additional oral enteric contrast COMPARISON:  08/31/2021 FINDINGS: CT CHEST FINDINGS Cardiovascular: Aortic atherosclerosis. Unchanged enlargement of the tubular ascending thoracic aorta measuring up to 4.2 x 4.1 cm. Normal heart size. Scattered left coronary artery calcifications no pericardial effusion. Mediastinum/Nodes: No enlarged mediastinal, hilar, or axillary lymph nodes. Thyroid gland, trachea, and esophagus demonstrate no significant findings. Lungs/Pleura: Moderate centrilobular and paraseptal emphysema. Diffuse bilateral bronchial wall thickening. Unchanged post treatment/post radiation appearance of the posterior left upper lobe with spiculated consolidation measuring 2.6 x 2.2 cm with adjacent fiducial markers (series 4, image 59). Fiducial markers again seen in the posterior right upper lobe (series 4, image 53). Unchanged small bilateral pulmonary nodules, including a 1.0 x 0.8 cm (series 4, image  36) and a 0.4 cm subpleural nodule of the left lower lobe (series 4, image 105). No pleural effusion or pneumothorax. Musculoskeletal: No chest wall mass or suspicious osseous lesions identified. CT ABDOMEN PELVIS FINDINGS Hepatobiliary: No solid liver abnormality is seen. No gallstones, gallbladder wall thickening, or biliary dilatation. Pancreas: Unremarkable. No pancreatic ductal dilatation or surrounding inflammatory changes. Spleen: Normal in size without significant abnormality. Adrenals/Urinary Tract: Stable, benign fatty attenuation adenomatous thickening of the adrenal glands. Kidneys are normal, without renal calculi, solid lesion, or hydronephrosis. Wall thickening of the urinary bladder (series 2, image 118). Stomach/Bowel: Stomach is within normal limits. Appendix appears normal. No evidence of bowel wall thickening, distention, or inflammatory changes. Descending and sigmoid diverticulosis. Moderate burden of stool throughout the colon. Vascular/Lymphatic: Aortic atherosclerosis. Unchanged aneurysm of the infrarenal abdominal aorta measuring up to 3.4 x 3.2 cm. No enlarged abdominal or pelvic lymph nodes. Reproductive: No mass or other abnormality. Other: Small fat and small bowel containing broad-based midline ventral hernia (series 2, image 88). No ascites. Musculoskeletal: No acute osseous findings. IMPRESSION: 1. Unchanged post treatment/post radiation appearance of the posterior left upper lobe with spiculated consolidation and adjacent fiducial markers. Fiducial markers again seen in the posterior right upper lobe without appreciable mass or other abnormality. 2. Unchanged small bilateral pulmonary nodules, most likely benign and incidental. Continued attention on follow-up. 3. No other evidence of lymphadenopathy or metastatic disease in the chest, abdomen, or pelvis. 4. Unchanged enlargement of the tubular ascending thoracic aorta measuring up to 4.2 x 4.1 cm. Unchanged aneurysm of the  infrarenal abdominal aorta measuring up to 3.4 x 3.2 cm. Recommend annual imaging followup by CTA or MRA not otherwise imaged. This recommendation follows 2010 ACCF/AHA/AATS/ACR/ASA/SCA/SCAI/SIR/STS/SVM Guidelines for the Diagnosis and Management of Patients with Thoracic Aortic Disease. Circulation. 2010; 121: S854-O270. Aortic aneurysm NOS (ICD10-I71.9) 5. Wall thickening of the urinary bladder, suggesting nonspecific infectious or inflammatory cystitis. Correlate with urinalysis. 6. Coronary artery disease. 7. Emphysema diffuse bilateral bronchial wall thickening. Aortic Atherosclerosis (ICD10-I70.0) and Emphysema (ICD10-J43.9). Electronically Signed   By: Delanna Ahmadi M.D.   On: 03/24/2022 15:14   CT Abdomen  Pelvis W Contrast  Result Date: 03/24/2022 CLINICAL DATA:  Metastatic lung cancer restaging * Tracking Code: BO * EXAM: CT CHEST, ABDOMEN, AND PELVIS WITH CONTRAST TECHNIQUE: Multidetector CT imaging of the chest, abdomen and pelvis was performed following the standard protocol during bolus administration of intravenous contrast. RADIATION DOSE REDUCTION: This exam was performed according to the departmental dose-optimization program which includes automated exposure control, adjustment of the mA and/or kV according to patient size and/or use of iterative reconstruction technique. CONTRAST:  192mL OMNIPAQUE IOHEXOL 300 MG/ML SOLN, additional oral enteric contrast COMPARISON:  08/31/2021 FINDINGS: CT CHEST FINDINGS Cardiovascular: Aortic atherosclerosis. Unchanged enlargement of the tubular ascending thoracic aorta measuring up to 4.2 x 4.1 cm. Normal heart size. Scattered left coronary artery calcifications no pericardial effusion. Mediastinum/Nodes: No enlarged mediastinal, hilar, or axillary lymph nodes. Thyroid gland, trachea, and esophagus demonstrate no significant findings. Lungs/Pleura: Moderate centrilobular and paraseptal emphysema. Diffuse bilateral bronchial wall thickening. Unchanged post  treatment/post radiation appearance of the posterior left upper lobe with spiculated consolidation measuring 2.6 x 2.2 cm with adjacent fiducial markers (series 4, image 59). Fiducial markers again seen in the posterior right upper lobe (series 4, image 53). Unchanged small bilateral pulmonary nodules, including a 1.0 x 0.8 cm (series 4, image 36) and a 0.4 cm subpleural nodule of the left lower lobe (series 4, image 105). No pleural effusion or pneumothorax. Musculoskeletal: No chest wall mass or suspicious osseous lesions identified. CT ABDOMEN PELVIS FINDINGS Hepatobiliary: No solid liver abnormality is seen. No gallstones, gallbladder wall thickening, or biliary dilatation. Pancreas: Unremarkable. No pancreatic ductal dilatation or surrounding inflammatory changes. Spleen: Normal in size without significant abnormality. Adrenals/Urinary Tract: Stable, benign fatty attenuation adenomatous thickening of the adrenal glands. Kidneys are normal, without renal calculi, solid lesion, or hydronephrosis. Wall thickening of the urinary bladder (series 2, image 118). Stomach/Bowel: Stomach is within normal limits. Appendix appears normal. No evidence of bowel wall thickening, distention, or inflammatory changes. Descending and sigmoid diverticulosis. Moderate burden of stool throughout the colon. Vascular/Lymphatic: Aortic atherosclerosis. Unchanged aneurysm of the infrarenal abdominal aorta measuring up to 3.4 x 3.2 cm. No enlarged abdominal or pelvic lymph nodes. Reproductive: No mass or other abnormality. Other: Small fat and small bowel containing broad-based midline ventral hernia (series 2, image 88). No ascites. Musculoskeletal: No acute osseous findings. IMPRESSION: 1. Unchanged post treatment/post radiation appearance of the posterior left upper lobe with spiculated consolidation and adjacent fiducial markers. Fiducial markers again seen in the posterior right upper lobe without appreciable mass or other  abnormality. 2. Unchanged small bilateral pulmonary nodules, most likely benign and incidental. Continued attention on follow-up. 3. No other evidence of lymphadenopathy or metastatic disease in the chest, abdomen, or pelvis. 4. Unchanged enlargement of the tubular ascending thoracic aorta measuring up to 4.2 x 4.1 cm. Unchanged aneurysm of the infrarenal abdominal aorta measuring up to 3.4 x 3.2 cm. Recommend annual imaging followup by CTA or MRA not otherwise imaged. This recommendation follows 2010 ACCF/AHA/AATS/ACR/ASA/SCA/SCAI/SIR/STS/SVM Guidelines for the Diagnosis and Management of Patients with Thoracic Aortic Disease. Circulation. 2010; 121: C623-J628. Aortic aneurysm NOS (ICD10-I71.9) 5. Wall thickening of the urinary bladder, suggesting nonspecific infectious or inflammatory cystitis. Correlate with urinalysis. 6. Coronary artery disease. 7. Emphysema diffuse bilateral bronchial wall thickening. Aortic Atherosclerosis (ICD10-I70.0) and Emphysema (ICD10-J43.9). Electronically Signed   By: Delanna Ahmadi M.D.   On: 03/24/2022 15:14     ASSESSMENT/PLAN:  This is a very pleasant 71 year old Caucasian male with stage IV non-small cell lung cancer, squamous cell  carcinoma.  He presented with multiple bilateral pulmonary nodules.  He was diagnosed in April 2018.   He underwent systemic chemotherapy with carboplatin and paclitaxel.  He is status post 6 cycles.  He tolerated it well except for the development of persistent peripheral neuropathy.  He was then on observation for 3 months. He showed signs of disease progression.   The patient was then treated with 28 cycles of immunotherapy with nivolumab 480 mg IV every 4 weeks.  This has been on hold since 05/01/2020 due to patient request for a break. 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 Dr. Julien Nordmann personally and independently reviewed the scan and discussed the results with the  patient. The scan showed no evidence of disease progression.   Dr. Julien Nordmann recommends that he continue on observation with a restaging CT scan of the chest, abdomen, and pelvis in 6 months.   Discussed with the patient that if he is in the area he would need to pick up oral contrast some to wear between now and 6 months.  The other option would be to get to the scanning department 2 hours early for his scan and drink the oral contrast there.  The patient was strongly encouraged to quit smoking.  The patient was advised to call immediately if he has any concerning symptoms in the interval. The patient voices understanding of current disease status and treatment options and is in agreement with the current care plan. All questions were answered. The patient knows to call the clinic with any problems, questions or concerns. We can certainly see the patient much sooner if necessary      I discussed the assessment and treatment plan with the patient. The patient was provided an opportunity to ask questions and all were answered. The patient agreed with the plan and demonstrated an understanding of the instructions.  The patient was advised to call back or seek an in-person evaluation if the symptoms worsen or if the condition fails to improve as anticipated.  I provided 20 minutes of non face-to-face telephone visit time during this encounter, and > 50% was spent counseling as documented under my assessment & plan.  Jaykwon Morones L Delberta Folts, PA-C 04/01/2022 11:10 AM  Orders Placed This Encounter  Procedures   CT Chest W Contrast    Standing Status:   Future    Standing Expiration Date:   04/01/2023    Order Specific Question:   If indicated for the ordered procedure, I authorize the administration of contrast media per Radiology protocol    Answer:   Yes    Order Specific Question:   Preferred imaging location?    Answer:   [facility]   CT Abdomen Pelvis W Contrast    Standing  Status:   Future    Standing Expiration Date:   04/01/2023    Order Specific Question:   If indicated for the ordered procedure, I authorize the administration of contrast media per Radiology protocol    Answer:   Yes    Order Specific Question:   Preferred imaging location?    Answer:   [facility]    Order Specific Question:   Is Oral Contrast requested for this exam?    Answer:   Yes, Per Radiology protocol   CBC with Differential ([facility] Only)    Standing Status:   Future    Standing Expiration Date:   04/02/2023  CMP ([facility] only)    Standing Status:   Future    Standing Expiration Date:   04/02/2023     Tobe Sos Betania Dizon, PA-C 04/01/22  ADDENDUM: Hematology/Oncology Attending: I contributed to the telephone visit with the patient today.  I reviewed his record, lab, scan and recommended his care plan.  This is a very pleasant 71 years old white male with stage IV non-small cell lung cancer, squamous cell carcinoma presented with multiple bilateral pulmonary nodules diagnosed in April 2018 status post systemic chemotherapy with carboplatin and paclitaxel for 6 cycles with peripheral neuropathy as an adverse effects.  He developed disease progression 3 months later. The patient was then treated second line treatment with immunotherapy with nivolumab 480 Mg IV every 4 weeks status post 28 cycles.  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 He has been doing fine with no clear evidence for disease progression. He had repeat CT scan of the chest performed recently.  I personally and independently reviewed the scans and discussed the result with the patient today. His scan showed no concerning findings for disease progression. I recommended for him to continue on observation with repeat CT scan of  the chest, abdomen pelvis in 6 months. The patient was advised to call immediately if he has any concerning symptoms in the interval.  Disclaimer: This note was dictated with voice recognition software. Similar sounding words can inadvertently be transcribed and may be missed upon review. Eilleen Kempf, MD

## 2022-03-30 ENCOUNTER — Telehealth: Payer: Self-pay | Admitting: Medical Oncology

## 2022-03-30 NOTE — Telephone Encounter (Signed)
I confirmed his video appt with Cassie.

## 2022-04-01 ENCOUNTER — Inpatient Hospital Stay: Payer: Medicare PPO | Attending: Physician Assistant | Admitting: Physician Assistant

## 2022-04-01 DIAGNOSIS — C7800 Secondary malignant neoplasm of unspecified lung: Secondary | ICD-10-CM | POA: Diagnosis not present

## 2022-05-10 ENCOUNTER — Other Ambulatory Visit: Payer: Self-pay

## 2022-09-30 ENCOUNTER — Ambulatory Visit (HOSPITAL_COMMUNITY)
Admission: RE | Admit: 2022-09-30 | Discharge: 2022-09-30 | Disposition: A | Discharge status: Home / Self Care | Payer: Medicare PPO | Source: Ambulatory Visit | Attending: Physician Assistant | Admitting: Physician Assistant

## 2022-09-30 ENCOUNTER — Inpatient Hospital Stay: Payer: Medicare PPO | Attending: Physician Assistant

## 2022-09-30 DIAGNOSIS — C3492 Malignant neoplasm of unspecified part of left bronchus or lung: Secondary | ICD-10-CM | POA: Diagnosis not present

## 2022-09-30 DIAGNOSIS — M25512 Pain in left shoulder: Secondary | ICD-10-CM | POA: Insufficient documentation

## 2022-09-30 DIAGNOSIS — C7800 Secondary malignant neoplasm of unspecified lung: Secondary | ICD-10-CM | POA: Insufficient documentation

## 2022-09-30 DIAGNOSIS — Z85118 Personal history of other malignant neoplasm of bronchus and lung: Secondary | ICD-10-CM | POA: Diagnosis present

## 2022-09-30 DIAGNOSIS — R918 Other nonspecific abnormal finding of lung field: Secondary | ICD-10-CM | POA: Diagnosis not present

## 2022-09-30 LAB — CBC WITH DIFFERENTIAL (CANCER CENTER ONLY)
Abs Immature Granulocytes: 0.04 10*3/uL (ref 0.00–0.07)
Basophils Absolute: 0.1 10*3/uL (ref 0.0–0.1)
Basophils Relative: 1 %
Eosinophils Absolute: 0.2 10*3/uL (ref 0.0–0.5)
Eosinophils Relative: 2 %
HCT: 44.6 % (ref 39.0–52.0)
Hemoglobin: 15.1 g/dL (ref 13.0–17.0)
Immature Granulocytes: 0 %
Lymphocytes Relative: 33 %
Lymphs Abs: 3.1 10*3/uL (ref 0.7–4.0)
MCH: 30.6 pg (ref 26.0–34.0)
MCHC: 33.9 g/dL (ref 30.0–36.0)
MCV: 90.5 fL (ref 80.0–100.0)
Monocytes Absolute: 0.8 10*3/uL (ref 0.1–1.0)
Monocytes Relative: 8 %
Neutro Abs: 5.2 10*3/uL (ref 1.7–7.7)
Neutrophils Relative %: 56 %
Platelet Count: 248 10*3/uL (ref 150–400)
RBC: 4.93 MIL/uL (ref 4.22–5.81)
RDW: 13.5 % (ref 11.5–15.5)
WBC Count: 9.4 10*3/uL (ref 4.0–10.5)
nRBC: 0 % (ref 0.0–0.2)

## 2022-09-30 LAB — CMP (CANCER CENTER ONLY)
ALT: 9 U/L (ref 0–44)
AST: 13 U/L — ABNORMAL LOW (ref 15–41)
Albumin: 4.5 g/dL (ref 3.5–5.0)
Alkaline Phosphatase: 71 U/L (ref 38–126)
Anion gap: 3 — ABNORMAL LOW (ref 5–15)
BUN: 7 mg/dL — ABNORMAL LOW (ref 8–23)
CO2: 35 mmol/L — ABNORMAL HIGH (ref 22–32)
Calcium: 9.5 mg/dL (ref 8.9–10.3)
Chloride: 98 mmol/L (ref 98–111)
Creatinine: 0.86 mg/dL (ref 0.61–1.24)
GFR, Estimated: >60 mL/min (ref >60)
Glucose, Bld: 111 mg/dL — ABNORMAL HIGH (ref 70–99)
Potassium: 4.4 mmol/L (ref 3.5–5.1)
Sodium: 136 mmol/L (ref 135–145)
Total Bilirubin: 0.6 mg/dL (ref 0.3–1.2)
Total Protein: 7.2 g/dL (ref 6.5–8.1)

## 2022-09-30 MED ORDER — SODIUM CHLORIDE (PF) 0.9 % IJ SOLN
INTRAMUSCULAR | Status: AC
  Filled 2022-09-30: qty 50

## 2022-09-30 MED ORDER — IOHEXOL 300 MG/ML  SOLN
100.0000 mL | Freq: Once | INTRAMUSCULAR | Status: AC | PRN
  Administered 2022-09-30: 100 mL via INTRAVENOUS

## 2022-10-01 ENCOUNTER — Other Ambulatory Visit: Payer: Medicare PPO

## 2022-10-04 ENCOUNTER — Telehealth: Payer: Self-pay | Admitting: Medical Oncology

## 2022-10-04 ENCOUNTER — Inpatient Hospital Stay (HOSPITAL_BASED_OUTPATIENT_CLINIC_OR_DEPARTMENT_OTHER): Payer: Medicare PPO | Admitting: Internal Medicine

## 2022-10-04 DIAGNOSIS — C7800 Secondary malignant neoplasm of unspecified lung: Secondary | ICD-10-CM

## 2022-10-04 DIAGNOSIS — C3490 Malignant neoplasm of unspecified part of unspecified bronchus or lung: Secondary | ICD-10-CM
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
## 2022-10-04 NOTE — Telephone Encounter (Signed)
Unable to connect to pt by phone for his visit today. I called home and mobile.

## 2022-10-04 NOTE — Addendum Note (Signed)
Addended by: [name] on: 10/04/2022 05:13 PM   Modules accepted: Orders, Level of Service

## 2022-10-04 NOTE — Telephone Encounter (Signed)
I returned pt call and forwarded it to [facility].

## 2022-10-06 ENCOUNTER — Other Ambulatory Visit: Payer: Self-pay

## 2023-03-08 ENCOUNTER — Telehealth: Payer: Self-pay | Admitting: Internal Medicine

## 2023-03-08 NOTE — Telephone Encounter (Signed)
Called patient regarding June appointment, patient is notified.  

## 2023-03-09 ENCOUNTER — Telehealth: Payer: Self-pay | Admitting: Internal Medicine

## 2023-03-09 NOTE — Telephone Encounter (Signed)
Called patient regarding June appointment, patient is notified.

## 2023-03-25 ENCOUNTER — Telehealth: Payer: Self-pay | Admitting: Internal Medicine

## 2023-03-25 NOTE — Telephone Encounter (Signed)
Per patient request to reschedule appointment. Patient is aware of rescheduled appointment times/dates.

## 2023-04-01 ENCOUNTER — Ambulatory Visit (HOSPITAL_COMMUNITY): Payer: Medicare PPO

## 2023-04-01 ENCOUNTER — Other Ambulatory Visit: Payer: Medicare PPO

## 2023-04-05 ENCOUNTER — Telehealth: Payer: Medicare PPO | Admitting: Internal Medicine

## 2023-04-06 ENCOUNTER — Ambulatory Visit (HOSPITAL_COMMUNITY)
Admission: RE | Admit: 2023-04-06 | Discharge: 2023-04-06 | Disposition: A | Discharge status: Home / Self Care | Payer: Medicare HMO | Source: Ambulatory Visit | Attending: Internal Medicine | Admitting: Internal Medicine

## 2023-04-06 ENCOUNTER — Other Ambulatory Visit: Payer: Self-pay

## 2023-04-06 ENCOUNTER — Inpatient Hospital Stay: Payer: Medicare HMO | Attending: Internal Medicine

## 2023-04-06 DIAGNOSIS — C3490 Malignant neoplasm of unspecified part of unspecified bronchus or lung: Secondary | ICD-10-CM | POA: Insufficient documentation

## 2023-04-06 DIAGNOSIS — Z85118 Personal history of other malignant neoplasm of bronchus and lung: Secondary | ICD-10-CM | POA: Insufficient documentation

## 2023-04-06 DIAGNOSIS — C7800 Secondary malignant neoplasm of unspecified lung: Secondary | ICD-10-CM

## 2023-04-06 LAB — CBC WITH DIFFERENTIAL (CANCER CENTER ONLY)
Abs Immature Granulocytes: 0.03 10*3/uL (ref 0.00–0.07)
Basophils Absolute: 0.1 10*3/uL (ref 0.0–0.1)
Basophils Relative: 1 %
Eosinophils Absolute: 0.3 10*3/uL (ref 0.0–0.5)
Eosinophils Relative: 3 %
HCT: 42.2 % (ref 39.0–52.0)
Hemoglobin: 14.2 g/dL (ref 13.0–17.0)
Immature Granulocytes: 0 %
Lymphocytes Relative: 45 %
Lymphs Abs: 3.4 10*3/uL (ref 0.7–4.0)
MCH: 29.7 pg (ref 26.0–34.0)
MCHC: 33.6 g/dL (ref 30.0–36.0)
MCV: 88.3 fL (ref 80.0–100.0)
Monocytes Absolute: 0.6 10*3/uL (ref 0.1–1.0)
Monocytes Relative: 8 %
Neutro Abs: 3.3 10*3/uL (ref 1.7–7.7)
Neutrophils Relative %: 43 %
Platelet Count: 236 10*3/uL (ref 150–400)
RBC: 4.78 MIL/uL (ref 4.22–5.81)
RDW: 13.3 % (ref 11.5–15.5)
WBC Count: 7.7 10*3/uL (ref 4.0–10.5)
nRBC: 0 % (ref 0.0–0.2)

## 2023-04-06 LAB — CMP (CANCER CENTER ONLY)
ALT: 9 U/L (ref 0–44)
AST: 15 U/L (ref 15–41)
Albumin: 3.9 g/dL (ref 3.5–5.0)
Alkaline Phosphatase: 59 U/L (ref 38–126)
Anion gap: 5 (ref 5–15)
BUN: 7 mg/dL — ABNORMAL LOW (ref 8–23)
CO2: 30 mmol/L (ref 22–32)
Calcium: 8.8 mg/dL — ABNORMAL LOW (ref 8.9–10.3)
Chloride: 100 mmol/L (ref 98–111)
Creatinine: 0.81 mg/dL (ref 0.61–1.24)
GFR, Estimated: >60 mL/min (ref >60)
Glucose, Bld: 90 mg/dL (ref 70–99)
Potassium: 3.8 mmol/L (ref 3.5–5.1)
Sodium: 135 mmol/L (ref 135–145)
Total Bilirubin: 0.4 mg/dL (ref 0.3–1.2)
Total Protein: 6.6 g/dL (ref 6.5–8.1)

## 2023-04-06 MED ORDER — IOHEXOL 300 MG/ML  SOLN
100.0000 mL | Freq: Once | INTRAMUSCULAR | Status: AC | PRN
  Administered 2023-04-06: 100 mL via INTRAVENOUS

## 2023-04-19 ENCOUNTER — Inpatient Hospital Stay (HOSPITAL_BASED_OUTPATIENT_CLINIC_OR_DEPARTMENT_OTHER): Payer: Medicare HMO | Admitting: Internal Medicine

## 2023-04-19 ENCOUNTER — Inpatient Hospital Stay: Payer: Medicare HMO | Attending: Internal Medicine | Admitting: Internal Medicine

## 2023-04-19 DIAGNOSIS — C3490 Malignant neoplasm of unspecified part of unspecified bronchus or lung: Secondary | ICD-10-CM | POA: Diagnosis not present

## 2023-04-19 DIAGNOSIS — C7800 Secondary malignant neoplasm of unspecified lung: Secondary | ICD-10-CM

## 2023-04-19 NOTE — Progress Notes (Signed)
[facility] [phone]   [phone]  PROGRESS NOTE FOR TELEMEDICINE VISITS  Laws, [name] Deem, MD [address] [place] [address]  I connected withNAME@ on 04/19/23 at  1:00 PM EDT by telephone visit and verified that I am speaking with the correct person using two identifiers.   I discussed the limitations, risks, security and privacy concerns of performing an evaluation and management service by telemedicine and the availability of in-person appointments. I also discussed with the patient that there may be a patient responsible charge related to this service. The patient expressed understanding and agreed to proceed.  Other persons participating in the visit and their role in the encounter:  None  Patient's location:  Home Provider's location: [facility]  DIAGNOSIS: Stage IV (T3, N0, M1 a) non-small cell lung cancer, squamous cell carcinoma presented with multiple bilateral pulmonary nodules diagnosed in April 2018.   PRIOR THERAPY: 1) Systemic chemotherapy with carboplatin for AUC of 5 and paclitaxel 175 MG/M2 every 3 weeks with Neulasta support. Status post 6 cycles. 2) Second line treatment with immunotherapy was Nivolumab 480 mg IV every 4 weeks.  First cycle November 14, 2017.  Status post 28 cycles. The patient opted to take a break from treatment.   CURRENT THERAPY: Observation  INTERVAL HISTORY: [name] [name] 72 y.o. male has a telephone virtual visit with me today for evaluation and discussion of his scan results.  The patient is feeling fine today with no concerning complaints except for mild fatigue and shortness of breath with exertion with mild cough.  He denied having any significant chest pain or hemoptysis.  He has no nausea, vomiting, diarrhea or constipation.  He has no headache or visual changes.  He has no fever or chills.  He has been in observation for several years now and he had repeat CT scan of the  chest, abdomen and pelvis performed recently.  We are having the visit for evaluation and discussion of his scan results.  MEDICAL HISTORY: Past Medical History:  Diagnosis Date   AAA (abdominal aortic aneurysm)    per CT in epic 10-17-2020 3.6cm   Bilateral iliac artery aneurysm (HCC)    per CT in epic 10-17-2020  1.9cm   Carotid occlusion, right    with infarction 2017   Chronic diarrhea    COPD (chronic obstructive pulmonary disease) (HCC)    previously has seen pulmonolgy, dr Demetrius Charity. mannan (lov 09-29-2018 in epic) ;   (12-19-2020  pt stated no oxygen, uses rescue inhaler as needed, and sob with stairs)   Depression    Diverticulosis of colon    DOE (dyspnea on exertion)    per pt sob w/ stairs and recovers after few minutes   Dyspnea    GERD (gastroesophageal reflux disease)    History of antineoplastic chemotherapy 01/2017  to 09/ 2019   lung cancer   History of cerebrovascular accident (CVA) from right carotid artery occlusion involving right middle cerebral artery territory 2018     (last neurology note 11-14-2017 pt released on prn basis back to pcp follow up)   neurologist--- dr v. Marjory Lies--- left hand weakness/ numbing intermittantly in setting of lung cancer, MRI 12/ 2017 right frontal ishcemic infartions(watershed vs. embolic infarts due to right ICA stenosis or occlusion vs. cerebral metastateses) confirmed by duplex occluded right ICA  (03-04-20022 pt stated no residual)   History of duodenal ulcer 02/2017   perforated pylori channel ulcer s/p surgery ,  modified graham's patch   History of MRSA infection    remote   History of radiation therapy 08/2018   SRBT left upper lobe   History of sepsis 05/07/2019   hospital admission pt dehydrated with AKI--- resolved   Hypertension    Left carotid stenosis    per duplex 40-59%   Metastatic squamous cell carcinoma to lung, unspecified laterality [facility]) oncologist-- dr Ardelle Anton   dx 04/ 2018 , bronchoscopy w/ bx's for bilateral  pulm. nodules,  Stage 4 non-small cell ; completed chemo 09/ 2019 and had immunotherapy and SBRT to left upper lobe 11/ 2019   Peripheral neuropathy due to chemotherapy [facility]    Right inguinal hernia    Smokers' cough (HCC)    per pt mostly productive in am    Wears glasses     ALLERGIES:  has No Known Allergies.  MEDICATIONS:  Current Outpatient Medications  Medication Sig Dispense Refill   acetaminophen (TYLENOL) 325 MG tablet You can take 2 every 4 hours as needed.  You can buy it over the counter DO NOT TAKE MORE THAN 4000 MG OF TYLENOL PER DAY.  IT CAN HARM YOUR LIVER.     albuterol (VENTOLIN HFA) 108 (90 Base) MCG/ACT inhaler Inhale into the lungs every 6 (six) hours as needed for wheezing or shortness of breath.     albuterol (VENTOLIN HFA) 108 (90 Base) MCG/ACT inhaler Inhale into the lungs every 6 (six) hours as needed for wheezing or shortness of breath.     amitriptyline (ELAVIL) 100 MG tablet Take 1 tablet (100 mg total) by mouth at bedtime. 30 tablet 3   aspirin EC 81 MG tablet Take 81 mg by mouth daily.     gabapentin (NEURONTIN) 300 MG capsule TAKE 2 CAPSULES BY MOUTH THREE TIMES DAILY (Patient taking differently: Take 600 mg by mouth 3 (three) times daily.) 90 capsule 0   lisinopril (ZESTRIL) 2.5 MG tablet Take 2.5 mg by mouth daily.     loperamide (IMODIUM) 2 MG capsule Take 1 capsule (2 mg total) by mouth 2 (two) times daily. Also available over the counter. You can take 1 extra dose in the afternoon if diarrhea is not improving. 60 capsule 4   mesalamine (APRISO) 0.375 g 24 hr capsule Take 4 capsules by mouth daily. (Patient not taking: No sig reported)     metoprolol succinate (TOPROL-XL) 25 MG 24 hr tablet Take 1 tablet (25 mg total) by mouth daily. 30 tablet 4   mirtazapine (REMERON) 30 MG tablet TAKE 1 TABLET BY MOUTH AT BEDTIME (Patient taking differently: Take 30 mg by mouth at bedtime.) 30 tablet 0   nortriptyline (PAMELOR) 10 MG capsule Take 10 mg by mouth 2 (two)  times daily.     Omega-3 Fatty Acids (OMEGA-3 FISH OIL PO) Take 1 capsule by mouth daily.     omeprazole (PRILOSEC) 20 MG capsule Take 20 mg by mouth 2 (two) times daily before a meal.     oxyCODONE (OXY IR/ROXICODONE) 5 MG immediate release tablet Take 1 tablet (5 mg total) by mouth every 6 (six) hours as needed for severe pain. 20 tablet 0   simvastatin (ZOCOR) 20 MG tablet Take 20 mg by mouth daily.     temazepam (RESTORIL) 30 MG capsule Take 1 capsule (30 mg total) by mouth at bedtime. (Patient taking differently: Take 30 mg by mouth at bedtime.) 30 capsule 0   No current facility-administered medications for this visit.    SURGICAL HISTORY:  Past Surgical History:  Procedure Laterality Date   COLONOSCOPY     INGUINAL HERNIA REPAIR Right 12/25/2020   Procedure: RIGHT OPEN INGUINAL HERNIA REPAIR WITH MESH;  Surgeon: Kinsinger, De Blanch, MD;  Location: [facility];  Service: General;  Laterality: Right;  ROOM 3 STARTING AT 09:30AM FOR 1 HOUR   LAPAROTOMY N/A 02/25/2017   Procedure: EXPLORATORY LAPAROTOMY MODIFIED GRAHAM'S PATCH;  Surgeon: Rodman Pickle, MD;  Location: [facility];  Service: General;  Laterality: N/A;   MINOR PLACEMENT OF FIDUCIAL N/A 01/19/2017   Procedure: MINOR PLACEMENT OF FIDUCIAL x 6;  Surgeon: Leslye Peer, MD;  Location: [facility];  Service: Thoracic;  Laterality: N/A;   PILONIDAL CYST EXCISION  2019   VIDEO BRONCHOSCOPY WITH ENDOBRONCHIAL NAVIGATION N/A 01/19/2017   Procedure: VIDEO BRONCHOSCOPY WITH ENDOBRONCHIAL NAVIGATION;  Surgeon: Leslye Peer, MD;  Location: [facility];  Service: Thoracic;  Laterality: N/A;    REVIEW OF SYSTEMS:  A comprehensive review of systems was negative except for: Constitutional: positive for fatigue Respiratory: positive for dyspnea on exertion and pleurisy/chest pain    LABORATORY DATA: Lab Results  Component Value Date   WBC 7.7 04/06/2023   HGB 14.2 04/06/2023   HCT 42.2 04/06/2023   MCV 88.3 04/06/2023   PLT  236 04/06/2023      Chemistry      Component Value Date/Time   NA 135 04/06/2023 1521   NA 139 10/06/2017 1052   K 3.8 04/06/2023 1521   K 4.2 10/06/2017 1052   CL 100 04/06/2023 1521   CO2 30 04/06/2023 1521   CO2 25 10/06/2017 1052   BUN 7 (L) 04/06/2023 1521   BUN 13.2 10/06/2017 1052   CREATININE 0.81 04/06/2023 1521   CREATININE 0.9 10/06/2017 1052      Component Value Date/Time   CALCIUM 8.8 (L) 04/06/2023 1521   CALCIUM 9.5 10/06/2017 1052   ALKPHOS 59 04/06/2023 1521   ALKPHOS 68 10/06/2017 1052   AST 15 04/06/2023 1521   AST 14 10/06/2017 1052   ALT 9 04/06/2023 1521   ALT 13 10/06/2017 1052   BILITOT 0.4 04/06/2023 1521   BILITOT 0.44 10/06/2017 1052       RADIOGRAPHIC STUDIES: CT Chest W Contrast  Result Date: 04/11/2023 CLINICAL DATA:  Non-small-cell lung cancer. Restaging. * Tracking Code: BO * EXAM: CT CHEST, ABDOMEN, AND PELVIS WITH CONTRAST TECHNIQUE: Multidetector CT imaging of the chest, abdomen and pelvis was performed following the standard protocol during bolus administration of intravenous contrast. RADIATION DOSE REDUCTION: This exam was performed according to the departmental dose-optimization program which includes automated exposure control, adjustment of the mA and/or kV according to patient size and/or use of iterative reconstruction technique. CONTRAST:  OMNIPAQUE IOHEXOL 300 MG/ML  SOLN COMPARISON:  09/30/2022 FINDINGS: CT CHEST FINDINGS Cardiovascular: The heart size is normal. No substantial pericardial effusion. Mild atherosclerotic calcification is noted in the wall of the thoracic aorta. Ascending thoracic aorta measures 4.1 cm diameter. Mediastinum/Nodes: No mediastinal lymphadenopathy. There is no hilar lymphadenopathy. The esophagus has normal imaging features. There is no axillary lymphadenopathy. Lungs/Pleura: Centrilobular and paraseptal emphysema evident. Interval continued progression of a bilobed pleuroparenchymal lesion in the  right apex. The more anterior of the 2 lobes of this lesion was progressive on the previous study and shows continued progression on today's exam measuring 15 x 10 mm compared to 11 x 8 mm previously. A second nodular component is more posterior and medial is also progressive in the interval.  10 mm nodule in the posterior right upper lobe near the fiducial marker is stable (47/506). Stable architectural distortion/scarring in the perifissural right lower lobe (87/506). Post treatment scarring in the posterior left upper lobe in the region of the fiducial markers shows associated thickening of the major fissure, findings not appreciably changed since the previous exam. Lateral aspect of the major fissure shows some subpleural nodularity on 78/506, new since prior. Crescent or bandlike irregular thickening in the perifissural superior segment left lower lobe (63/506) appears thicker than on the previous study measuring 14 mm today compared to 11 mm when measured in a similar fashion on the prior study. Small subpleural nodule left lower lobe on 112/506 is unchanged. No focal airspace consolidation.  No pleural effusion. Musculoskeletal: Similar appearance of bony change in the lateral left fourth and fifth ribs compatible disease involvement. CT ABDOMEN PELVIS FINDINGS Hepatobiliary: No suspicious focal abnormality within the liver parenchyma. There is no evidence for gallstones, gallbladder wall thickening, or pericholecystic fluid. No intrahepatic or extrahepatic biliary dilation. Pancreas: No focal mass lesion. No dilatation of the main duct. No intraparenchymal cyst. No peripancreatic edema. Spleen: No splenomegaly. No suspicious focal mass lesion. Adrenals/Urinary Tract: Stable low-attenuation thickening of both adrenal glands consistent with hyperplasia. Kidneys unremarkable. No evidence for hydroureter. Mild circumferential bladder wall thickening evident. Stomach/Bowel: Stomach is unremarkable. No gastric wall  thickening. No evidence of outlet obstruction. Duodenum is normally positioned as is the ligament of Treitz. No small bowel wall thickening. No small bowel dilatation. The terminal ileum is normal. The appendix is normal. No gross colonic mass. No colonic wall thickening. Diverticular changes are noted in the left colon without evidence of diverticulitis. Vascular/Lymphatic: Infrarenal abdominal aorta measures up to 3.7 cm diameter. Moderate to advanced atherosclerotic disease. There is no gastrohepatic or hepatoduodenal ligament lymphadenopathy. No retroperitoneal or mesenteric lymphadenopathy. No pelvic sidewall lymphadenopathy. Reproductive: Prostate gland upper normal to mildly enlarged. Other: No intraperitoneal free fluid. Musculoskeletal: Small left groin hernia contains only fat. Small paraumbilical hernia contains fat and a short segment of small bowel without complicating features. No worrisome lytic or sclerotic osseous abnormality. IMPRESSION: 1. Interval continued progression of a bilobed pleuroparenchymal lesion in the right apex. Given the continued progression, neoplasm is an increasing concern. PET-CT recommended to further evaluate. 2. Post treatment scarring posterior left upper lobe is stable in the interval since the prior study. 3. Interval progression of crescent or bandlike irregular thickening in the perifissural superior segment left lower lobe with new small nodular opacity along the left major fissure at the lateral pleura. Close attention on follow-up recommended in this could be further assessed on PET-CT. 4. Stable 10 mm nodule in the posterior right upper lobe near the fiducial marker. 5. No evidence for metastatic disease in the abdomen or pelvis. 6. Stable 4.1 cm ascending thoracic aortic aneurysm. Infrarenal abdominal aortic aneurysm measures 3.7 cm today, slightly increased from 3.5 cm previously. Continued attention on follow-up restaging studies recommended. 7. Stable mild  circumferential bladder wall thickening. 8. Aortic Atherosclerosis (ICD10-I70.0) and Emphysema (ICD10-J43.9). Electronically Signed   By: Kennith Center M.D.   On: 04/11/2023 09:59   CT Abdomen Pelvis W Contrast  Result Date: 04/11/2023 CLINICAL DATA:  Non-small-cell lung cancer. Restaging. * Tracking Code: BO * EXAM: CT CHEST, ABDOMEN, AND PELVIS WITH CONTRAST TECHNIQUE: Multidetector CT imaging of the chest, abdomen and pelvis was performed following the standard protocol during bolus administration of intravenous contrast. RADIATION DOSE REDUCTION: This exam was performed according to the departmental  dose-optimization program which includes automated exposure control, adjustment of the mA and/or kV according to patient size and/or use of iterative reconstruction technique. CONTRAST:  OMNIPAQUE IOHEXOL 300 MG/ML  SOLN COMPARISON:  09/30/2022 FINDINGS: CT CHEST FINDINGS Cardiovascular: The heart size is normal. No substantial pericardial effusion. Mild atherosclerotic calcification is noted in the wall of the thoracic aorta. Ascending thoracic aorta measures 4.1 cm diameter. Mediastinum/Nodes: No mediastinal lymphadenopathy. There is no hilar lymphadenopathy. The esophagus has normal imaging features. There is no axillary lymphadenopathy. Lungs/Pleura: Centrilobular and paraseptal emphysema evident. Interval continued progression of a bilobed pleuroparenchymal lesion in the right apex. The more anterior of the 2 lobes of this lesion was progressive on the previous study and shows continued progression on today's exam measuring 15 x 10 mm compared to 11 x 8 mm previously. A second nodular component is more posterior and medial is also progressive in the interval. 10 mm nodule in the posterior right upper lobe near the fiducial marker is stable (47/506). Stable architectural distortion/scarring in the perifissural right lower lobe (87/506). Post treatment scarring in the posterior left upper lobe in the  region of the fiducial markers shows associated thickening of the major fissure, findings not appreciably changed since the previous exam. Lateral aspect of the major fissure shows some subpleural nodularity on 78/506, new since prior. Crescent or bandlike irregular thickening in the perifissural superior segment left lower lobe (63/506) appears thicker than on the previous study measuring 14 mm today compared to 11 mm when measured in a similar fashion on the prior study. Small subpleural nodule left lower lobe on 112/506 is unchanged. No focal airspace consolidation.  No pleural effusion. Musculoskeletal: Similar appearance of bony change in the lateral left fourth and fifth ribs compatible disease involvement. CT ABDOMEN PELVIS FINDINGS Hepatobiliary: No suspicious focal abnormality within the liver parenchyma. There is no evidence for gallstones, gallbladder wall thickening, or pericholecystic fluid. No intrahepatic or extrahepatic biliary dilation. Pancreas: No focal mass lesion. No dilatation of the main duct. No intraparenchymal cyst. No peripancreatic edema. Spleen: No splenomegaly. No suspicious focal mass lesion. Adrenals/Urinary Tract: Stable low-attenuation thickening of both adrenal glands consistent with hyperplasia. Kidneys unremarkable. No evidence for hydroureter. Mild circumferential bladder wall thickening evident. Stomach/Bowel: Stomach is unremarkable. No gastric wall thickening. No evidence of outlet obstruction. Duodenum is normally positioned as is the ligament of Treitz. No small bowel wall thickening. No small bowel dilatation. The terminal ileum is normal. The appendix is normal. No gross colonic mass. No colonic wall thickening. Diverticular changes are noted in the left colon without evidence of diverticulitis. Vascular/Lymphatic: Infrarenal abdominal aorta measures up to 3.7 cm diameter. Moderate to advanced atherosclerotic disease. There is no gastrohepatic or hepatoduodenal ligament  lymphadenopathy. No retroperitoneal or mesenteric lymphadenopathy. No pelvic sidewall lymphadenopathy. Reproductive: Prostate gland upper normal to mildly enlarged. Other: No intraperitoneal free fluid. Musculoskeletal: Small left groin hernia contains only fat. Small paraumbilical hernia contains fat and a short segment of small bowel without complicating features. No worrisome lytic or sclerotic osseous abnormality. IMPRESSION: 1. Interval continued progression of a bilobed pleuroparenchymal lesion in the right apex. Given the continued progression, neoplasm is an increasing concern. PET-CT recommended to further evaluate. 2. Post treatment scarring posterior left upper lobe is stable in the interval since the prior study. 3. Interval progression of crescent or bandlike irregular thickening in the perifissural superior segment left lower lobe with new small nodular opacity along the left major fissure at the lateral pleura. Close attention on follow-up  recommended in this could be further assessed on PET-CT. 4. Stable 10 mm nodule in the posterior right upper lobe near the fiducial marker. 5. No evidence for metastatic disease in the abdomen or pelvis. 6. Stable 4.1 cm ascending thoracic aortic aneurysm. Infrarenal abdominal aortic aneurysm measures 3.7 cm today, slightly increased from 3.5 cm previously. Continued attention on follow-up restaging studies recommended. 7. Stable mild circumferential bladder wall thickening. 8. Aortic Atherosclerosis (ICD10-I70.0) and Emphysema (ICD10-J43.9). Electronically Signed   By: Kennith Center M.D.   On: 04/11/2023 09:59    ASSESSMENT AND PLAN: This is a very pleasant 72 years old white male with stage IV non-small cell lung cancer, squamous cell carcinoma presented with multiple bilateral pulmonary nodules. The patient was treated with systemic chemotherapy with carboplatin and paclitaxel status post 6 cycles. He tolerated the last cycle of his treatment well except for  the persistent peripheral neuropathy. The patient was on observation for the last 3 months. Repeat imaging studies including CT scan of the chest, abdomen and pelvis scan showed mild interval enlargement of left upper lobe as well as right upper lobe pulmonary nodules concerning for disease progression. The patient started on treatment with immunotherapy with Nivolumab 480 mg IV every 4 weeks status post 28 cycles.  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  The patient has been in observation for several years now and he has been doing well with no concerning complaints except for the baseline shortness of breath and intermittent right-sided chest pain. He had repeat CT scan of the chest, abdomen and pelvis performed recently.  I personally and independently reviewed the scan images and discussed results with the patient today. His scan showed continued progression of a bilobed pleural-parenchymal lesion in the right apex suspicious for progressive disease. I recommended for the patient to have a PET scan for further evaluation of this lesion and to rule out disease progression or recurrence. I will see him back for follow-up visit in 3 weeks for evaluation and discussion of his PET scan and further recommendation regarding his condition. The patient was advised to call immediately if he has any other concerning symptoms in the interval. I discussed the assessment and treatment plan with the patient. The patient was provided an opportunity to ask questions and all were answered. The patient agreed with the plan and demonstrated an understanding of the instructions.   The patient was advised to call back or seek an in-person evaluation if the symptoms worsen or if the condition fails to improve as anticipated.  I provided 15 minutes of non face-to-face telephone visit time during this encounter, and > 50% was spent counseling as documented under my  assessment & plan.  Lajuana Matte, MD 04/19/2023 1:36 PM  Disclaimer: This note was dictated with voice recognition software. Similar sounding words can inadvertently be transcribed and may not be corrected upon review.

## 2023-04-19 NOTE — Progress Notes (Signed)
No show

## 2023-04-26 ENCOUNTER — Other Ambulatory Visit: Payer: Self-pay | Admitting: Medical Oncology

## 2023-04-26 ENCOUNTER — Telehealth: Payer: Self-pay | Admitting: Medical Oncology

## 2023-04-26 NOTE — Telephone Encounter (Signed)
I gave pt number to call for PET scan appt.

## 2023-05-11 ENCOUNTER — Telehealth: Payer: Self-pay | Admitting: Internal Medicine

## 2023-05-11 ENCOUNTER — Ambulatory Visit: Payer: Medicare HMO | Admitting: Internal Medicine

## 2023-05-11 NOTE — Telephone Encounter (Signed)
Called patient regarding August appointments, left a voicemail.

## 2023-05-12 ENCOUNTER — Encounter (HOSPITAL_COMMUNITY)
Admission: RE | Admit: 2023-05-12 | Discharge: 2023-05-12 | Disposition: A | Discharge status: Home / Self Care | Payer: Medicare HMO | Source: Ambulatory Visit | Attending: Internal Medicine | Admitting: Internal Medicine

## 2023-05-12 DIAGNOSIS — C3490 Malignant neoplasm of unspecified part of unspecified bronchus or lung: Secondary | ICD-10-CM | POA: Diagnosis present

## 2023-05-12 LAB — GLUCOSE, CAPILLARY: Glucose-Capillary: 100 mg/dL — ABNORMAL HIGH (ref 70–99)

## 2023-05-12 MED ORDER — FLUDEOXYGLUCOSE F - 18 (FDG) INJECTION
7.7000 | Freq: Once | INTRAVENOUS | Status: AC
  Administered 2023-05-12: 6.25 via INTRAVENOUS

## 2023-05-19 ENCOUNTER — Inpatient Hospital Stay: Payer: Medicare HMO | Attending: Internal Medicine | Admitting: Internal Medicine

## 2023-05-19 DIAGNOSIS — C3490 Malignant neoplasm of unspecified part of unspecified bronchus or lung: Secondary | ICD-10-CM | POA: Diagnosis not present

## 2023-05-19 NOTE — Progress Notes (Signed)
[facility] [phone]   [phone]  PROGRESS NOTE FOR TELEMEDICINE VISITS  [name] [name], [name] Deem, MD [address] [place] [address]  I connected withNAME@ on 05/19/23 at  1:30 PM EDT by telephone visit and verified that I am speaking with the correct person using two identifiers.   I discussed the limitations, risks, security and privacy concerns of performing an evaluation and management service by telemedicine and the availability of in-person appointments. I also discussed with the patient that there may be a patient responsible charge related to this service. The patient expressed understanding and agreed to proceed.  Other persons participating in the visit and their role in the encounter: None  Patient's location: Home Provider's location: [facility]  DIAGNOSIS: Stage IV (T3, N0, M1 a) non-small cell lung cancer, squamous cell carcinoma presented with multiple bilateral pulmonary nodules diagnosed in April 2018.   PRIOR THERAPY: 1) Systemic chemotherapy with carboplatin for AUC of 5 and paclitaxel 175 MG/M2 every 3 weeks with Neulasta support. Status post 6 cycles. 2) Second line treatment with immunotherapy was Nivolumab 480 mg IV every 4 weeks.  First cycle November 14, 2017.  Status post 28 cycles. The patient opted to take a break from treatment.   CURRENT THERAPY: Observation  INTERVAL HISTORY: [name] [name] [name] 72 y.o. male has a telephone virtual visit with me today for evaluation and discussion of his recent PET scan results.  The patient is feeling fine today with no concerning complaints except for the shortness of breath at baseline increased with exertion with mild cough but no significant chest pain or hemoptysis.  He has no nausea, vomiting, diarrhea or constipation.  He has no headache or visual changes.  He denied having any fever or chills.  He was found on previous CT scan of the chest to have suspicious lesion  in the left and right lung and we ordered a PET scan which was performed recently and we are having the visit for discussion of his PET scan and treatment options.  MEDICAL HISTORY: Past Medical History:  Diagnosis Date   AAA (abdominal aortic aneurysm)    per CT in epic 10-17-2020 3.6cm   Bilateral iliac artery aneurysm (HCC)    per CT in epic 10-17-2020  1.9cm   Carotid occlusion, right    with infarction 2017   Chronic diarrhea    COPD (chronic obstructive pulmonary disease) (HCC)    previously has seen pulmonolgy, dr Demetrius Charity. mannan (lov 09-29-2018 in epic) ;   (12-19-2020  pt stated no oxygen, uses rescue inhaler as needed, and sob with stairs)   Depression    Diverticulosis of colon    DOE (dyspnea on exertion)    per pt sob w/ stairs and recovers after few minutes   Dyspnea    GERD (gastroesophageal reflux disease)    History of antineoplastic chemotherapy 01/2017  to 09/ 2019   lung cancer   History of cerebrovascular accident (CVA) from right carotid artery occlusion involving right middle cerebral artery territory 2018     (last neurology note 11-14-2017 pt released on prn basis back to pcp follow up)   neurologist--- dr v. Marjory Lies--- left hand weakness/ numbing intermittantly in setting of lung cancer, MRI 12/ 2017 right frontal ishcemic infartions(watershed vs. embolic infarts due to right ICA stenosis or occlusion vs. cerebral metastateses) confirmed by duplex occluded right ICA  (03-04-20022 pt stated no residual)   History of duodenal ulcer 02/2017  perforated pylori channel ulcer s/p surgery , modified graham's patch   History of MRSA infection    remote   History of radiation therapy 08/2018   SRBT left upper lobe   History of sepsis 05/07/2019   hospital admission pt dehydrated with AKI--- resolved   Hypertension    Left carotid stenosis    per duplex 40-59%   Metastatic squamous cell carcinoma to lung, unspecified laterality [facility]) oncologist-- dr Ardelle Anton   dx 04/  2018 , bronchoscopy w/ bx's for bilateral pulm. nodules,  Stage 4 non-small cell ; completed chemo 09/ 2019 and had immunotherapy and SBRT to left upper lobe 11/ 2019   Peripheral neuropathy due to chemotherapy [facility])    Right inguinal hernia    Smokers' cough (HCC)    per pt mostly productive in am    Wears glasses     ALLERGIES:  has No Known Allergies.  MEDICATIONS:  Current Outpatient Medications  Medication Sig Dispense Refill   acetaminophen (TYLENOL) 325 MG tablet You can take 2 every 4 hours as needed.  You can buy it over the counter DO NOT TAKE MORE THAN 4000 MG OF TYLENOL PER DAY.  IT CAN HARM YOUR LIVER.     albuterol (VENTOLIN HFA) 108 (90 Base) MCG/ACT inhaler Inhale into the lungs every 6 (six) hours as needed for wheezing or shortness of breath.     albuterol (VENTOLIN HFA) 108 (90 Base) MCG/ACT inhaler Inhale into the lungs every 6 (six) hours as needed for wheezing or shortness of breath.     amitriptyline (ELAVIL) 100 MG tablet Take 1 tablet (100 mg total) by mouth at bedtime. 30 tablet 3   aspirin EC 81 MG tablet Take 81 mg by mouth daily.     gabapentin (NEURONTIN) 300 MG capsule TAKE 2 CAPSULES BY MOUTH THREE TIMES DAILY (Patient taking differently: Take 600 mg by mouth 3 (three) times daily.) 90 capsule 0   lisinopril (ZESTRIL) 2.5 MG tablet Take 2.5 mg by mouth daily.     loperamide (IMODIUM) 2 MG capsule Take 1 capsule (2 mg total) by mouth 2 (two) times daily. Also available over the counter. You can take 1 extra dose in the afternoon if diarrhea is not improving. 60 capsule 4   mesalamine (APRISO) 0.375 g 24 hr capsule Take 4 capsules by mouth daily. (Patient not taking: No sig reported)     metoprolol succinate (TOPROL-XL) 25 MG 24 hr tablet Take 1 tablet (25 mg total) by mouth daily. 30 tablet 4   mirtazapine (REMERON) 30 MG tablet TAKE 1 TABLET BY MOUTH AT BEDTIME (Patient taking differently: Take 30 mg by mouth at bedtime.) 30 tablet 0   nortriptyline (PAMELOR)  10 MG capsule Take 10 mg by mouth 2 (two) times daily.     Omega-3 Fatty Acids (OMEGA-3 FISH OIL PO) Take 1 capsule by mouth daily.     omeprazole (PRILOSEC) 20 MG capsule Take 20 mg by mouth 2 (two) times daily before a meal.     oxyCODONE (OXY IR/ROXICODONE) 5 MG immediate release tablet Take 1 tablet (5 mg total) by mouth every 6 (six) hours as needed for severe pain. 20 tablet 0   simvastatin (ZOCOR) 20 MG tablet Take 20 mg by mouth daily.     tamsulosin (FLOMAX) 0.4 MG CAPS capsule Take 0.4 mg by mouth daily.     temazepam (RESTORIL) 30 MG capsule Take 1 capsule (30 mg total) by mouth at bedtime. (Patient taking differently: Take 30  mg by mouth at bedtime.) 30 capsule 0   No current facility-administered medications for this visit.    SURGICAL HISTORY:  Past Surgical History:  Procedure Laterality Date   COLONOSCOPY     INGUINAL HERNIA REPAIR Right 12/25/2020   Procedure: RIGHT OPEN INGUINAL HERNIA REPAIR WITH MESH;  Surgeon: Kinsinger, De Blanch, MD;  Location: [facility];  Service: General;  Laterality: Right;  ROOM 3 STARTING AT 09:30AM FOR 1 HOUR   LAPAROTOMY N/A 02/25/2017   Procedure: EXPLORATORY LAPAROTOMY MODIFIED GRAHAM'S PATCH;  Surgeon: Rodman Pickle, MD;  Location: [facility];  Service: General;  Laterality: N/A;   MINOR PLACEMENT OF FIDUCIAL N/A 01/19/2017   Procedure: MINOR PLACEMENT OF FIDUCIAL x 6;  Surgeon: Leslye Peer, MD;  Location: [facility];  Service: Thoracic;  Laterality: N/A;   PILONIDAL CYST EXCISION  2019   VIDEO BRONCHOSCOPY WITH ENDOBRONCHIAL NAVIGATION N/A 01/19/2017   Procedure: VIDEO BRONCHOSCOPY WITH ENDOBRONCHIAL NAVIGATION;  Surgeon: Leslye Peer, MD;  Location: [facility];  Service: Thoracic;  Laterality: N/A;    REVIEW OF SYSTEMS:  Constitutional: positive for fatigue Eyes: negative Ears, nose, mouth, throat, and face: negative Respiratory: positive for cough and dyspnea on exertion Cardiovascular: negative Gastrointestinal:  negative Genitourinary:negative Integument/breast: negative Hematologic/lymphatic: negative Musculoskeletal:negative Neurological: negative Behavioral/Psych: negative Endocrine: negative Allergic/Immunologic: negative     LABORATORY DATA: Lab Results  Component Value Date   WBC 7.7 04/06/2023   HGB 14.2 04/06/2023   HCT 42.2 04/06/2023   MCV 88.3 04/06/2023   PLT 236 04/06/2023      Chemistry      Component Value Date/Time   NA 135 04/06/2023 1521   NA 139 10/06/2017 1052   K 3.8 04/06/2023 1521   K 4.2 10/06/2017 1052   CL 100 04/06/2023 1521   CO2 30 04/06/2023 1521   CO2 25 10/06/2017 1052   BUN 7 (L) 04/06/2023 1521   BUN 13.2 10/06/2017 1052   CREATININE 0.81 04/06/2023 1521   CREATININE 0.9 10/06/2017 1052      Component Value Date/Time   CALCIUM 8.8 (L) 04/06/2023 1521   CALCIUM 9.5 10/06/2017 1052   ALKPHOS 59 04/06/2023 1521   ALKPHOS 68 10/06/2017 1052   AST 15 04/06/2023 1521   AST 14 10/06/2017 1052   ALT 9 04/06/2023 1521   ALT 13 10/06/2017 1052   BILITOT 0.4 04/06/2023 1521   BILITOT 0.44 10/06/2017 1052       RADIOGRAPHIC STUDIES: NM PET Image Restage (PS) Skull Base to Thigh (F-18 FDG)  Result Date: 05/18/2023 CLINICAL DATA:  Subsequent treatment strategy for non-small cell lung cancer. EXAM: NUCLEAR MEDICINE PET SKULL BASE TO THIGH TECHNIQUE: 6.25 mCi F-18 FDG was injected intravenously. Full-ring PET imaging was performed from the skull base to thigh after the radiotracer. CT data was obtained and used for attenuation correction and anatomic localization. Fasting blood glucose: 100 mg/dl COMPARISON:  CT 78/46/9629 FINDINGS: Mediastinal blood pool activity: SUV max 2.17 Liver activity: SUV max NA NECK: No hypermetabolic lymph nodes in the neck. Incidental CT findings: None. CHEST: Increased radiotracer uptake is identified within the left lung corresponding to the progressive, bandlike area of soft tissue thickening in the perifissural superior  segment of left lower lobe with SUV max of 4.51, image 26/7. Mild pleural thickening/small effusion overlying the posterior left lung is noted. There is mild corresponding increased tracer active with SUV max 1.78. Within the right apex there is a spiculated nodule measuring 2.1 x 1.2 cm with SUV  max of 7.87. Small scattered lung nodules are identified which are too small to reliably characterize by PET-CT. The largest is in the posterior right upper lobe measuring 5 mm with SUV max of 0.67, image 17/7. No tracer avid mediastinal or hilar lymph nodes. Incidental CT findings: Aortic atherosclerosis and coronary artery calcifications. ABDOMEN/PELVIS: No abnormal hypermetabolic activity within the liver, pancreas, adrenal glands, or spleen. No hypermetabolic lymph nodes in the abdomen or pelvis. Incidental CT findings: Infrarenal abdominal aortic aneurysm measures 3.6 cm. Aortic atherosclerosis. Fat containing periumbilical hernia. SKELETON: No focal hypermetabolic activity to suggest skeletal metastasis. Incidental CT findings: Increased uptake within the left lateral chest wall corresponding to post treatment changes and chronic pathologic fracture identified. SUV max in this area is equal to 2.67, image 81/4. IMPRESSION: 1. Increased radiotracer uptake is identified within the left lung corresponding to the progressive, bandlike area of soft tissue thickening in the perifissural superior segment of left lower lobe with SUV max of 4.51. Findings are concerning for recurrent disease. 2. Spiculated nodule in the right apex measuring 2.1 x 1.2 cm with SUV max of 7.87. Findings are concerning for a second primary lung malignancy. 3. Small scattered lung nodules are identified which are too small to reliably characterize by PET-CT. The largest is in the posterior right upper lobe measuring 5 mm with SUV max of 0.67. 4. Increased uptake within the left lateral chest wall corresponding to post treatment changes and chronic  pathologic fracture identified. SUV max in this area is equal to 2.67. 5. Infrarenal abdominal aortic aneurysm measures 3.6 cm. The recommend follow-up every 2 years. 6. Aortic Atherosclerosis (ICD10-I70.0). Electronically Signed   By: Signa Kell M.D.   On: 05/18/2023 09:09    ASSESSMENT AND PLAN: This is a very pleasant 72 years old white male with stage IV non-small cell lung cancer, squamous cell carcinoma presented with multiple bilateral pulmonary nodules. The patient was treated with systemic chemotherapy with carboplatin and paclitaxel status post 6 cycles. He tolerated the last cycle of his treatment well except for the persistent peripheral neuropathy. The patient was on observation for the last 3 months. Repeat imaging studies including CT scan of the chest, abdomen and pelvis scan showed mild interval enlargement of left upper lobe as well as right upper lobe pulmonary nodules concerning for disease progression. The patient started on treatment with immunotherapy with Nivolumab 480 mg IV every 4 weeks status post 28 cycles.  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  The patient has been on observation since that time.  He was found on recent imaging studies including CT scan of the chest to have suspicious lesion in the left lung as well as right apical nodule.  The patient had a PET scan performed recently.  I personally and independently reviewed the scan images and discussed the results with the patient today. His scan showed increased radiotracer uptake within the left lung corresponding to the progressive bandlike area of soft tissue thickening in the perifissural superior segment of the left lower lobe concerning for local disease recurrence.  He also had a spiculated hypermetabolic nodule in the right apex suspicious for a second primary lung malignancy. I had a lengthy discussion with the patient today about these results.  I  recommended for him to see Dr. Kathrynn Running for consideration of SBRT to the suspicious lesion in the left as well as the right lung. I will see him back for follow-up visit in 4 months  for evaluation and repeat imaging studies to assess his response to the radiotherapy. The patient was advised to call immediately if he has any other concerning symptoms in the interval. I discussed the assessment and treatment plan with the patient. The patient was provided an opportunity to ask questions and all were answered. The patient agreed with the plan and demonstrated an understanding of the instructions.   The patient was advised to call back or seek an in-person evaluation if the symptoms worsen or if the condition fails to improve as anticipated.  I provided 28 minutes of non face-to-face telephone visit time during this encounter, and > 50% was spent counseling as documented under my assessment & plan.  Lajuana Matte, MD 05/19/2023 1:21 PM  Disclaimer: This note was dictated with voice recognition software. Similar sounding words can inadvertently be transcribed and may not be corrected upon review.

## 2023-05-20 ENCOUNTER — Telehealth: Payer: Self-pay | Admitting: Radiation Oncology

## 2023-05-20 NOTE — Telephone Encounter (Signed)
Left message for patient to call back to schedule consult per 8/1 referral.

## 2023-05-23 NOTE — Progress Notes (Signed)
Radiation Oncology         [phone] ________________________________  Initial outpatient Consultation - Conducted via telephone due to current COVID-19 concerns for limiting patient exposure  Name: [name] [name] MRN: [id number]  Date of Service: 05/24/2023 DOB: 31-Jan-1951  NU:UVOZ, [name], MD  Si Gaul, MD   REFERRING PHYSICIAN: Si Gaul, MD  DIAGNOSIS: The encounter diagnosis was Metastatic squamous cell carcinoma to lung, unspecified laterality (HCC).    ICD-10-CM   1. Metastatic squamous cell carcinoma to lung, unspecified laterality (HCC)  C78.00       HISTORY OF PRESENT ILLNESS: CHRISOTPHER [name] is a 72 y.o. male seen at the request of Dr. Arbutus Ped. He was previously seen by my partner, Dr. Roselind Messier, in 2019. In summary, he was initially evaluated for pneumonia in early 2018 and was found to have bilateral lung nodules on chest x-ray. Subsequent PET scan on 01/03/17 confirmed hypermetabolic dominant RUL lung mass with additional bilateral pulmonary nodules, no adenopathy or distant metastatic disease. Bronchoscopy from 01/19/17 confirmed squamous cell carcinoma non-small cell lung cancer in bilateral upper lobes. He was treated with 6 cycles of carboplatin and paclitaxel from 02/09/17 through 07/05/17. He was switched to immunotherapy with nivolumab on 11/14/17. He was found to have progression in the LUL, prompting referral to Dr. Roselind Messier. He was subsequently treated with SBRT in 08/2018. He continued nivolumab through 04/2020, at which time he opted to take a break from treatment. He has continued on observation under Dr. Arbutus Ped.   Recent restaging CT scans have shown progression of a right apex lesion and LLL thickening/nodularity. This prompted a restaging PET scan on 05/12/23 showing: increased radiotracer uptake identified within left lower lobe corresponding to the progressive area of soft tissue thickening; hypermetabolic 2.1 cm spiculated nodule in right apex; increased uptake  within left lateral chest wall corresponding to post-treatment changes and chronic pathologic fracture.  The patient has reviewed the scan results with his oncologist, and he has been kindly referred back to [place] today for discussion of potential additional radiation therapy to the enlarging nodules.  PREVIOUS RADIATION THERAPY: Yes  08/29/18 - 09/05/18: Left Lung / SBRT, 54 Gy in 3 fractions  PAST MEDICAL HISTORY:  Past Medical History:  Diagnosis Date   AAA (abdominal aortic aneurysm)    per CT in epic 10-17-2020 3.6cm   Bilateral iliac artery aneurysm (HCC)    per CT in epic 10-17-2020  1.9cm   Carotid occlusion, right    with infarction 2017   Chronic diarrhea    COPD (chronic obstructive pulmonary disease) (HCC)    previously has seen pulmonolgy, dr Demetrius Charity. mannan (lov 09-29-2018 in epic) ;   (12-19-2020  pt stated no oxygen, uses rescue inhaler as needed, and sob with stairs)   Depression    Diverticulosis of colon    DOE (dyspnea on exertion)    per pt sob w/ stairs and recovers after few minutes   Dyspnea    GERD (gastroesophageal reflux disease)    History of antineoplastic chemotherapy 01/2017  to 09/ 2019   lung cancer   History of cerebrovascular accident (CVA) from right carotid artery occlusion involving right middle cerebral artery territory 2018     (last neurology note 11-14-2017 pt released on prn basis back to pcp follow up)   neurologist--- dr v. Marjory Lies--- left hand weakness/ numbing intermittantly in setting of lung cancer, MRI 12/ 2017 right frontal ishcemic infartions(watershed vs. embolic infarts due to right ICA stenosis or occlusion vs. cerebral  metastateses) confirmed by duplex occluded right ICA  (03-04-20022 pt stated no residual)   History of duodenal ulcer 02/2017   perforated pylori channel ulcer s/p surgery , modified graham's patch   History of MRSA infection    remote   History of radiation therapy 08/2018   SRBT left upper lobe   History of sepsis  05/07/2019   hospital admission pt dehydrated with AKI--- resolved   Hypertension    Left carotid stenosis    per duplex 40-59%   Metastatic squamous cell carcinoma to lung, unspecified laterality [facility]) oncologist-- dr Ardelle Anton   dx 04/ 2018 , bronchoscopy w/ bx's for bilateral pulm. nodules,  Stage 4 non-small cell ; completed chemo 09/ 2019 and had immunotherapy and SBRT to left upper lobe 11/ 2019   Peripheral neuropathy due to chemotherapy [facility])    Right inguinal hernia    Smokers' cough (HCC)    per pt mostly productive in am    Wears glasses       PAST SURGICAL HISTORY: Past Surgical History:  Procedure Laterality Date   COLONOSCOPY     INGUINAL HERNIA REPAIR Right 12/25/2020   Procedure: RIGHT OPEN INGUINAL HERNIA REPAIR WITH MESH;  Surgeon: Kinsinger, De Blanch, MD;  Location: [facility];  Service: General;  Laterality: Right;  ROOM 3 STARTING AT 09:30AM FOR 1 HOUR   LAPAROTOMY N/A 02/25/2017   Procedure: EXPLORATORY LAPAROTOMY MODIFIED GRAHAM'S PATCH;  Surgeon: Kinsinger, De Blanch, MD;  Location: [facility];  Service: General;  Laterality: N/A;   MINOR PLACEMENT OF FIDUCIAL N/A 01/19/2017   Procedure: MINOR PLACEMENT OF FIDUCIAL x 6;  Surgeon: Leslye Peer, MD;  Location: [facility];  Service: Thoracic;  Laterality: N/A;   PILONIDAL CYST EXCISION  2019   VIDEO BRONCHOSCOPY WITH ENDOBRONCHIAL NAVIGATION N/A 01/19/2017   Procedure: VIDEO BRONCHOSCOPY WITH ENDOBRONCHIAL NAVIGATION;  Surgeon: Leslye Peer, MD;  Location: [facility] OR;  Service: Thoracic;  Laterality: N/A;    FAMILY HISTORY:  Family History  Problem Relation Age of Onset   Cancer Father        unsure of what type    SOCIAL HISTORY:  Social History   Socioeconomic History   Marital status: Single    Spouse name: Not on file   Number of children: 0   Years of education: 10   Highest education level: Not on file  Occupational History    Comment: NA  Tobacco Use   Smoking status: Every Day     Current packs/day: 1.00    Average packs/day: 1 pack/day for 52.0 years (52.0 ttl pk-yrs)    Types: Cigarettes   Smokeless tobacco: Never   Tobacco comments:    per pt started at age 66   Vaping Use   Vaping status: Never Used  Substance and Sexual Activity   Alcohol use: No   Drug use: Yes    Types: Marijuana    Comment: 12-19-2020 per pt smokes 2-3 per week   Sexual activity: Not Currently  Other Topics Concern   Not on file  Social History Narrative   Lives alone   Caffeine- coffee, 5 cups daily   Social Determinants of Health   Financial Resource Strain: Not on file  Food Insecurity: Not on file  Transportation Needs: No Transportation Needs (10/05/2018)   PRAPARE - Administrator, Civil Service (Medical): No    Lack of Transportation (Non-Medical): No  Physical Activity: Not on file  Stress: Not on file  Social Connections: Not on file  Intimate Partner Violence: Not At Risk (10/05/2018)   Humiliation, Afraid, Rape, and Kick questionnaire    Fear of Current or Ex-Partner: No    Emotionally Abused: No    Physically Abused: No    Sexually Abused: No    ALLERGIES: Patient has no known allergies.  MEDICATIONS:  Current Outpatient Medications  Medication Sig Dispense Refill   acetaminophen (TYLENOL) 325 MG tablet You can take 2 every 4 hours as needed.  You can buy it over the counter DO NOT TAKE MORE THAN 4000 MG OF TYLENOL PER DAY.  IT CAN HARM YOUR LIVER.     albuterol (VENTOLIN HFA) 108 (90 Base) MCG/ACT inhaler Inhale into the lungs every 6 (six) hours as needed for wheezing or shortness of breath.     albuterol (VENTOLIN HFA) 108 (90 Base) MCG/ACT inhaler Inhale into the lungs every 6 (six) hours as needed for wheezing or shortness of breath.     amitriptyline (ELAVIL) 100 MG tablet Take 1 tablet (100 mg total) by mouth at bedtime. 30 tablet 3   aspirin EC 81 MG tablet Take 81 mg by mouth daily.     gabapentin (NEURONTIN) 300 MG capsule TAKE 2  CAPSULES BY MOUTH THREE TIMES DAILY (Patient taking differently: Take 600 mg by mouth 3 (three) times daily.) 90 capsule 0   lisinopril (ZESTRIL) 2.5 MG tablet Take 2.5 mg by mouth daily.     loperamide (IMODIUM) 2 MG capsule Take 1 capsule (2 mg total) by mouth 2 (two) times daily. Also available over the counter. You can take 1 extra dose in the afternoon if diarrhea is not improving. 60 capsule 4   mesalamine (APRISO) 0.375 g 24 hr capsule Take 4 capsules by mouth daily. (Patient not taking: No sig reported)     metoprolol succinate (TOPROL-XL) 25 MG 24 hr tablet Take 1 tablet (25 mg total) by mouth daily. 30 tablet 4   mirtazapine (REMERON) 30 MG tablet TAKE 1 TABLET BY MOUTH AT BEDTIME (Patient taking differently: Take 30 mg by mouth at bedtime.) 30 tablet 0   nortriptyline (PAMELOR) 10 MG capsule Take 10 mg by mouth 2 (two) times daily.     Omega-3 Fatty Acids (OMEGA-3 FISH OIL PO) Take 1 capsule by mouth daily.     omeprazole (PRILOSEC) 20 MG capsule Take 20 mg by mouth 2 (two) times daily before a meal.     oxyCODONE (OXY IR/ROXICODONE) 5 MG immediate release tablet Take 1 tablet (5 mg total) by mouth every 6 (six) hours as needed for severe pain. 20 tablet 0   simvastatin (ZOCOR) 20 MG tablet Take 20 mg by mouth daily.     tamsulosin (FLOMAX) 0.4 MG CAPS capsule Take 0.4 mg by mouth daily.     temazepam (RESTORIL) 30 MG capsule Take 1 capsule (30 mg total) by mouth at bedtime. (Patient taking differently: Take 30 mg by mouth at bedtime.) 30 capsule 0   No current facility-administered medications for this encounter.    REVIEW OF SYSTEMS:  On review of systems, the patient reports that he is doing well overall. He denies any chest pain, shortness of breath, cough, fevers, chills, night sweats, unintended weight changes. He denies any bowel or bladder disturbances, and denies abdominal pain, nausea or vomiting. He denies any new musculoskeletal or joint aches or pains. *** A complete review  of systems is obtained and is otherwise negative.    PHYSICAL EXAM:  Wt Readings from Last  3 Encounters:  05/12/23 125 lb (56.7 kg)  08/31/21 154 lb 3.2 oz (69.9 kg)  12/25/20 154 lb 9.6 oz (70.1 kg)   Temp Readings from Last 3 Encounters:  08/31/21 98.6 F (37 C) (Tympanic)  12/25/20 97.6 F (36.4 C)  10/28/20 97.8 F (36.6 C) (Tympanic)   BP Readings from Last 3 Encounters:  08/31/21 (!) 174/111  12/25/20 128/82  10/28/20 (!) 149/69   Pulse Readings from Last 3 Encounters:  08/31/21 89  12/25/20 (!) 58  10/28/20 69    /10  Physical exam not performed in light of telephone encounter.   KPS = ***  100 - Normal; no complaints; no evidence of disease. 90   - Able to carry on normal activity; minor signs or symptoms of disease. 80   - Normal activity with effort; some signs or symptoms of disease. 69   - Cares for self; unable to carry on normal activity or to do active work. 60   - Requires occasional assistance, but is able to care for most of his personal needs. 50   - Requires considerable assistance and frequent medical care. 40   - Disabled; requires special care and assistance. 30   - Severely disabled; hospital admission is indicated although death not imminent. 20   - Very sick; hospital admission necessary; active supportive treatment necessary. 10   - Moribund; fatal processes progressing rapidly. 0     - Dead  Karnofsky DA, Abelmann WH, Craver LS and Burchenal [facility] [phone]) The use of the nitrogen mustards in the palliative treatment of carcinoma: with particular reference to bronchogenic carcinoma Cancer 1 634-56  LABORATORY DATA:  Lab Results  Component Value Date   WBC 7.7 04/06/2023   HGB 14.2 04/06/2023   HCT 42.2 04/06/2023   MCV 88.3 04/06/2023   PLT 236 04/06/2023   Lab Results  Component Value Date   NA 135 04/06/2023   K 3.8 04/06/2023   CL 100 04/06/2023   CO2 30 04/06/2023   Lab Results  Component Value Date   ALT 9 04/06/2023   AST  15 04/06/2023   ALKPHOS 59 04/06/2023   BILITOT 0.4 04/06/2023     RADIOGRAPHY: NM PET Image Restage (PS) Skull Base to Thigh (F-18 FDG)  Result Date: 05/18/2023 CLINICAL DATA:  Subsequent treatment strategy for non-small cell lung cancer. EXAM: NUCLEAR MEDICINE PET SKULL BASE TO THIGH TECHNIQUE: 6.25 mCi F-18 FDG was injected intravenously. Full-ring PET imaging was performed from the skull base to thigh after the radiotracer. CT data was obtained and used for attenuation correction and anatomic localization. Fasting blood glucose: 100 mg/dl COMPARISON:  CT 11/91/4782 FINDINGS: Mediastinal blood pool activity: SUV max 2.17 Liver activity: SUV max NA NECK: No hypermetabolic lymph nodes in the neck. Incidental CT findings: None. CHEST: Increased radiotracer uptake is identified within the left lung corresponding to the progressive, bandlike area of soft tissue thickening in the perifissural superior segment of left lower lobe with SUV max of 4.51, image 26/7. Mild pleural thickening/small effusion overlying the posterior left lung is noted. There is mild corresponding increased tracer active with SUV max 1.78. Within the right apex there is a spiculated nodule measuring 2.1 x 1.2 cm with SUV max of 7.87. Small scattered lung nodules are identified which are too small to reliably characterize by PET-CT. The largest is in the posterior right upper lobe measuring 5 mm with SUV max of 0.67, image 17/7. No tracer avid mediastinal or hilar lymph nodes. Incidental CT  findings: Aortic atherosclerosis and coronary artery calcifications. ABDOMEN/PELVIS: No abnormal hypermetabolic activity within the liver, pancreas, adrenal glands, or spleen. No hypermetabolic lymph nodes in the abdomen or pelvis. Incidental CT findings: Infrarenal abdominal aortic aneurysm measures 3.6 cm. Aortic atherosclerosis. Fat containing periumbilical hernia. SKELETON: No focal hypermetabolic activity to suggest skeletal metastasis. Incidental  CT findings: Increased uptake within the left lateral chest wall corresponding to post treatment changes and chronic pathologic fracture identified. SUV max in this area is equal to 2.67, image 81/4. IMPRESSION: 1. Increased radiotracer uptake is identified within the left lung corresponding to the progressive, bandlike area of soft tissue thickening in the perifissural superior segment of left lower lobe with SUV max of 4.51. Findings are concerning for recurrent disease. 2. Spiculated nodule in the right apex measuring 2.1 x 1.2 cm with SUV max of 7.87. Findings are concerning for a second primary lung malignancy. 3. Small scattered lung nodules are identified which are too small to reliably characterize by PET-CT. The largest is in the posterior right upper lobe measuring 5 mm with SUV max of 0.67. 4. Increased uptake within the left lateral chest wall corresponding to post treatment changes and chronic pathologic fracture identified. SUV max in this area is equal to 2.67. 5. Infrarenal abdominal aortic aneurysm measures 3.6 cm. The recommend follow-up every 2 years. 6. Aortic Atherosclerosis (ICD10-I70.0). Electronically Signed   By: Signa Kell M.D.   On: 05/18/2023 09:09      IMPRESSION/PLAN: This visit was conducted via telephone to spare the patient unnecessary potential exposure in the healthcare setting during the current COVID-19 pandemic. 1. 72 y.o. man with recurrent squamous cell NSCLC***  Today, we talked to the patient and family about the findings and workup thus far. We discussed the natural history of lung cancer and general treatment, highlighting the role of radiotherapy in the management. The patient has previously received SBRT to the left lung.*** We discussed the available radiation techniques, and reviewed the details and logistics of delivery. We reviewed the anticipated acute and late sequelae associated with radiation in this setting. The patient was encouraged to ask questions  that were answered to his satisfaction.  At the end of our conversation, the patient ***   Given current concerns for patient exposure during the COVID-19 pandemic, this encounter was conducted via telephone. The patient was notified in advance and was offered a WebEX meeting to allow for face to face communication but unfortunately reported that he did not have the appropriate resources/technology to support such a visit and instead preferred to proceed with telephone consult. The patient has given verbal consent for this type of encounter. The time spent during this encounter was *** minutes. The attendants for this meeting include Margaretmary Dys MD, Farrel Conners, patient Vilinda Blanks {and ***.} During the encounter, Margaretmary Dys MD and Marcello Fennel PA-C were located at [facility] Radiation Oncology Department.  Patient CRUSOE OATLEY {and *** were} was located at home.     Marguarite Arbour, PA-C    Margaretmary Dys, MD  [facility]  Radiation Oncology Direct Dial: [phone]  Fax: [phone] [email or web address]  Skype  LinkedIn   This document serves as a record of services personally performed by Margaretmary Dys, MD and Marcello Fennel, PA-C. It was created on their behalf by Mickie Bail, a trained medical scribe. The creation of this record is based on the scribe's personal observations and the provider's statements to them. This document has been checked and  approved by the attending provider.

## 2023-05-23 NOTE — Progress Notes (Signed)
Thoracic Location of Tumor / Histology: Non small-cell cancer  05/12/2023 Dr. Arbutus Ped NM PET Image Restage (PS) Skull Base to Thigh CLINICAL DATA:  Subsequent treatment strategy for non-small cell lung cancer.  IMPRESSION: 1. Increased radiotracer uptake is identified within the left lung corresponding to the progressive, bandlike area of soft tissue thickening in the perifissural superior segment of left lower lobe with SUV max of 4.51. Findings are concerning for recurrent disease. 2. Spiculated nodule in the right apex measuring 2.1 x 1.2 cm with SUV max of 7.87. Findings are concerning for a second primary lung malignancy. 3. Small scattered lung nodules are identified which are too small to reliably characterize by PET-CT. The largest is in the posterior right upper lobe measuring 5 mm with SUV max of 0.67. 4. Increased uptake within the left lateral chest wall corresponding to post treatment changes and chronic pathologic fracture identified. SUV max in this area is equal to 2.67. 5. Infrarenal abdominal aortic aneurysm measures 3.6 cm. The recommend follow-up every 2 years. 6. Aortic Atherosclerosis (ICD10-I70.0).  04/06/2023 Dr. Arbutus Ped CT Chest with Contrast CLINICAL DATA:  Non-small-cell lung cancer. Restaging. * Tracking Code: BO *  IMPRESSION: 1. Interval continued progression of a bilobed pleuroparenchymal lesion in the right apex. Given the continued progression, neoplasm is an increasing concern. PET-CT recommended to further evaluate. 2. Post treatment scarring posterior left upper lobe is stable in the interval since the prior study. 3. Interval progression of crescent or bandlike irregular thickening in the perifissural superior segment left lower lobe with new small nodular opacity along the left major fissure at the lateral pleura. Close attention on follow-up recommended in this could be further assessed on PET-CT. 4. Stable 10 mm nodule in the posterior right upper lobe  near the fiducial marker. 5. No evidence for metastatic disease in the abdomen or pelvis. 6. Stable 4.1 cm ascending thoracic aortic aneurysm. Infrarenal abdominal aortic aneurysm measures 3.7 cm today, slightly increased from 3.5 cm previously. Continued attention on follow-up restaging studies recommended. 7. Stable mild circumferential bladder wall thickening. 8. Aortic Atherosclerosis (ICD10-I70.0) and Emphysema (ICD10-J43.9).    Tobacco/Marijuana/Snuff/ETOH use: Quit smoking, no alcohol use, marijuana use.  Past/Anticipated interventions by cardiothoracic surgery, if any:  NA  Past/Anticipated interventions by medical oncology, if any:   Dr. Arbutus Ped   Signs/Symptoms Weight changes, if any: {:18581} Respiratory complaints, if any: {:18581} Hemoptysis, if any: {:18581} Pain issues, if any:  {:18581}  SAFETY ISSUES: Prior radiation? {:18581} Pacemaker/ICD? {:18581}  Possible current pregnancy? Male Is the patient on methotrexate? No  Current Complaints / other details:

## 2023-05-24 ENCOUNTER — Ambulatory Visit
Admission: RE | Admit: 2023-05-24 | Discharge: 2023-05-24 | Disposition: A | Discharge status: Home / Self Care | Payer: Medicare HMO | Source: Ambulatory Visit | Attending: Radiation Oncology | Admitting: Radiation Oncology

## 2023-05-24 ENCOUNTER — Encounter: Payer: Self-pay | Admitting: Radiation Oncology

## 2023-05-24 ENCOUNTER — Other Ambulatory Visit: Payer: Self-pay

## 2023-05-24 VITALS — Ht 68.0 in | Wt 125.0 lb

## 2023-05-24 DIAGNOSIS — C7800 Secondary malignant neoplasm of unspecified lung: Secondary | ICD-10-CM

## 2023-05-24 DIAGNOSIS — C3411 Malignant neoplasm of upper lobe, right bronchus or lung: Secondary | ICD-10-CM | POA: Insufficient documentation

## 2023-05-30 ENCOUNTER — Ambulatory Visit
Admission: RE | Admit: 2023-05-30 | Discharge: 2023-05-30 | Disposition: A | Discharge status: Home / Self Care | Payer: Medicare HMO | Source: Ambulatory Visit | Attending: Radiation Oncology | Admitting: Radiation Oncology

## 2023-05-30 DIAGNOSIS — Z510 Encounter for antineoplastic radiation therapy: Secondary | ICD-10-CM | POA: Diagnosis present

## 2023-05-30 DIAGNOSIS — C3411 Malignant neoplasm of upper lobe, right bronchus or lung: Secondary | ICD-10-CM | POA: Diagnosis present

## 2023-06-02 DIAGNOSIS — Z510 Encounter for antineoplastic radiation therapy: Secondary | ICD-10-CM | POA: Diagnosis not present

## 2023-06-03 NOTE — Progress Notes (Signed)
  Radiation Oncology         [phone] ________________________________  Name: HENRIQUE KIRNER MRN: [id number]  Date: 05/30/2023  DOB: October 06, 1951  STEREOTACTIC BODY RADIOTHERAPY SIMULATION AND TREATMENT PLANNING NOTE    ICD-10-CM   1. Primary cancer of right upper lobe of lung (HCC)  C34.11       DIAGNOSIS:   72 yo man with a new putative metachronous Stage IA3 non-small cell carcinoma of the right upper lung  NARRATIVE:  The patient was brought to the CT Simulation planning suite.  Identity was confirmed.  All relevant records and images related to the planned course of therapy were reviewed.  The patient freely provided informed written consent to proceed with treatment after reviewing the details related to the planned course of therapy. The consent form was witnessed and verified by the simulation staff.  Then, the patient was set-up in a stable reproducible  supine position for radiation therapy.  A BodyFix immobilization pillow was fabricated for reproducible positioning.  Then I personally applied the abdominal compression paddle to limit respiratory excursion.  4D respiratoy motion management CT images were obtained.  Surface markings were placed.  The CT images were loaded into the planning software.  Then, using Cine, MIP, and standard views, the internal target volume (ITV) and planning target volumes (PTV) were delinieated, and avoidance structures were contoured.  Treatment planning then occurred.  The radiation prescription was entered and confirmed.  A total of two complex treatment devices were fabricated in the form of the BodyFix immobilization pillow and a neck accuform cushion.  I have requested : 3D Simulation  I have requested a DVH of the following structures: Heart, Lungs, Esophagus, Chest Wall, Brachial Plexus, Major Blood Vessels, and targets.  SPECIAL TREATMENT PROCEDURE:  The planned course of therapy using radiation constitutes a special treatment procedure. Special care  is required in the management of this patient for the following reasons. This treatment constitutes a Special Treatment Procedure for the following reason: [ High dose per fraction requiring special monitoring for increased toxicities of treatment including daily imaging..  The special nature of the planned course of radiotherapy will require increased physician supervision and oversight to ensure patient's safety with optimal treatment outcomes.  This requires extended time and effort.    RESPIRATORY MOTION MANAGEMENT SIMULATION:  In order to account for effect of respiratory motion on target structures and other organs in the planning and delivery of radiotherapy, this patient underwent respiratory motion management simulation.  To accomplish this, when the patient was brought to the CT simulation planning suite, 4D respiratoy motion management CT images were obtained.  The CT images were loaded into the planning software.  Then, using a variety of tools including Cine, MIP, and standard views, the target volume and planning target volumes (PTV) were delineated.  Avoidance structures were contoured.  Treatment planning then occurred.  Dose volume histograms were generated and reviewed for each of the requested structure.  The resulting plan was carefully reviewed and approved today.  PLAN:  The patient will receive 54 Gy in 3 fractions.  ________________________________  Artist Pais Kathrynn Running, M.D.

## 2023-06-14 ENCOUNTER — Other Ambulatory Visit: Payer: Self-pay

## 2023-06-14 ENCOUNTER — Ambulatory Visit
Admission: RE | Admit: 2023-06-14 | Discharge: 2023-06-14 | Disposition: A | Discharge status: Home / Self Care | Payer: Medicare HMO | Source: Ambulatory Visit | Attending: Radiation Oncology | Admitting: Radiation Oncology

## 2023-06-14 DIAGNOSIS — Z510 Encounter for antineoplastic radiation therapy: Secondary | ICD-10-CM | POA: Diagnosis not present

## 2023-06-14 LAB — RAD ONC ARIA SESSION SUMMARY
Course Elapsed Days: 0
Plan Fractions Treated to Date: 1
Plan Prescribed Dose Per Fraction: 18 Gy
Plan Total Fractions Prescribed: 3
Plan Total Prescribed Dose: 54 Gy
Reference Point Dosage Given to Date: 18 Gy
Reference Point Session Dosage Given: 18 Gy
Session Number: 1

## 2023-06-15 ENCOUNTER — Ambulatory Visit: Payer: Medicare HMO

## 2023-06-16 ENCOUNTER — Ambulatory Visit
Admission: RE | Admit: 2023-06-16 | Discharge: 2023-06-16 | Disposition: A | Discharge status: Home / Self Care | Payer: Medicare HMO | Source: Ambulatory Visit | Attending: Radiation Oncology | Admitting: Radiation Oncology

## 2023-06-16 ENCOUNTER — Other Ambulatory Visit: Payer: Self-pay

## 2023-06-16 DIAGNOSIS — Z510 Encounter for antineoplastic radiation therapy: Secondary | ICD-10-CM | POA: Diagnosis not present

## 2023-06-16 LAB — RAD ONC ARIA SESSION SUMMARY
Course Elapsed Days: 2
Plan Fractions Treated to Date: 2
Plan Prescribed Dose Per Fraction: 18 Gy
Plan Total Fractions Prescribed: 3
Plan Total Prescribed Dose: 54 Gy
Reference Point Dosage Given to Date: 36 Gy
Reference Point Session Dosage Given: 18 Gy
Session Number: 2

## 2023-06-17 ENCOUNTER — Ambulatory Visit: Payer: Medicare HMO

## 2023-06-21 ENCOUNTER — Ambulatory Visit
Admission: RE | Admit: 2023-06-21 | Discharge: 2023-06-21 | Disposition: A | Discharge status: Home / Self Care | Payer: Medicare HMO | Source: Ambulatory Visit | Attending: Radiation Oncology | Admitting: Radiation Oncology

## 2023-06-21 ENCOUNTER — Other Ambulatory Visit: Payer: Self-pay

## 2023-06-21 DIAGNOSIS — C3411 Malignant neoplasm of upper lobe, right bronchus or lung: Secondary | ICD-10-CM | POA: Insufficient documentation

## 2023-06-21 LAB — RAD ONC ARIA SESSION SUMMARY
Course Elapsed Days: 7
Plan Fractions Treated to Date: 3
Plan Prescribed Dose Per Fraction: 18 Gy
Plan Total Fractions Prescribed: 3
Plan Total Prescribed Dose: 54 Gy
Reference Point Dosage Given to Date: 54 Gy
Reference Point Session Dosage Given: 18 Gy
Session Number: 3

## 2023-06-22 NOTE — Radiation Completion Notes (Addendum)
Radiation Oncology         [phone] ________________________________  Name: [name] [name] MRN: [id number]  Date: 06/21/2023  DOB: Apr 15, 1951  Patient Name: [name] [name], [name] [name] MRN: [id number] Date of Birth: 1950/11/19 Referring Physician: Si Gaul, M.D. Date of Service: 2023-06-22 Radiation Oncologist: Margaretmary Bayley, M.D. [facility] [place]     RADIATION ONCOLOGY END OF TREATMENT NOTE     Diagnosis: 72 yo man with a new putative metachronous Stage IA3 non-small cell carcinoma of the right upper lung   Intent: Curative     ==========DELIVERED PLANS==========  First Treatment Date: 2023-06-14 - Last Treatment Date: 2023-06-21   Plan Name: Lung_R_SBRT Site: Lung, Right Technique: SBRT/SRT-IMRT Mode: Photon Dose Per Fraction: 18 Gy Prescribed Dose (Delivered / Prescribed): 54 Gy / 54 Gy Prescribed Fxs (Delivered / Prescribed): 3 / 3     ==========ON TREATMENT VISIT DATES========== 2023-06-14, 2023-06-16, 2023-06-21    See weekly On Treatment Notes in Epic for details.  He tolerated the treatments relatively well with only modest fatigue.  The patient will receive a call in about one month from the radiation oncology department. He will continue follow up with his medical oncologist, Dr. Arbutus Ped, as well.  He is scheduled for a posttreatment CT chest scan in December, prior to his next scheduled follow-up visit with Dr. Arbutus Ped.  ------------------------------------------------   Margaretmary Dys, MD [facility]  Radiation Oncology Direct Dial: [phone]  Fax: [phone] [email or web address]  Skype  LinkedIn

## 2023-06-23 ENCOUNTER — Ambulatory Visit: Payer: Medicare HMO

## 2023-06-27 ENCOUNTER — Ambulatory Visit: Payer: Medicare HMO

## 2023-07-05 ENCOUNTER — Telehealth: Payer: Self-pay

## 2023-07-05 NOTE — Telephone Encounter (Signed)
RN to return call to patient he wanted information on radiation treatment.  [name] completed SBRT treatment for upper right lobe lung 06/21/2023 and is due to have post treatment call 08/16/2023 at 2:30 pm this was left on his voicemail and if any questions to call back.

## 2023-07-29 ENCOUNTER — Other Ambulatory Visit: Payer: Self-pay

## 2023-07-29 NOTE — Addendum Note (Signed)
Encounter addended by: Marcello Fennel, PA-C on: 07/29/2023 2:03 PM  Actions taken: Clinical Note Signed

## 2023-08-16 ENCOUNTER — Telehealth: Payer: Self-pay | Admitting: *Deleted

## 2023-08-16 ENCOUNTER — Ambulatory Visit
Admission: RE | Admit: 2023-08-16 | Discharge: 2023-08-16 | Disposition: A | Discharge status: Home / Self Care | Payer: Medicare HMO | Source: Ambulatory Visit | Attending: Radiation Oncology | Admitting: Radiation Oncology

## 2023-08-16 NOTE — Progress Notes (Signed)
Radiation Oncology         [phone] ________________________________  Name: [name] [name] MRN: [id number]  Date of Service: 08/16/2023  DOB: 1951-05-24  Post Treatment Telephone Note  Diagnosis:  New putative metachronous Stage IA3 non-small cell carcinoma of the right upper lung (as documented in provider EOT note)  The patient was available for call today.   Symptoms of fatigue have not improved since completing therapy. Patient also complains of severe SOB, worsened w/ exertion.  Symptoms of skin changes have improved since completing therapy.  Symptoms of esophagitis have improved since completing therapy.  The patient has scheduled follow up with his medical oncologist Dr. Arbutus Ped for ongoing care, and was encouraged to call if he develops concerns or questions regarding radiation.   This concludes the interaction.  Ruel Favors, LPN

## 2023-08-16 NOTE — Telephone Encounter (Signed)
Returned patient's phone call, lvm for a return call 

## 2023-08-31 ENCOUNTER — Telehealth: Payer: Self-pay | Admitting: Medical Oncology

## 2023-08-31 NOTE — Telephone Encounter (Signed)
He reported trouble breathing . He has a "Collapsed lung " per PCP at yesterday's visit. CXR results>   "MPRESSION:   Large left-sided pleural effusion with atelectatic changes of left lower lobe.  This has occurred since the previous study"  He said Dr Pasty Arch told him to call [facility].  I instructed Khylan to go to ED.   Scan expected 11/25

## 2023-09-02 ENCOUNTER — Inpatient Hospital Stay (HOSPITAL_COMMUNITY)
Admission: EM | Admit: 2023-09-02 | Discharge: 2023-09-05 | DRG: 180 | Disposition: A | Discharge status: Home / Self Care | Payer: Medicare HMO | Source: Ambulatory Visit | Attending: Internal Medicine | Admitting: Internal Medicine

## 2023-09-02 ENCOUNTER — Other Ambulatory Visit: Payer: Self-pay

## 2023-09-02 ENCOUNTER — Emergency Department (HOSPITAL_COMMUNITY): Payer: Medicare HMO

## 2023-09-02 ENCOUNTER — Encounter (HOSPITAL_COMMUNITY): Payer: Self-pay

## 2023-09-02 DIAGNOSIS — F32A Depression, unspecified: Secondary | ICD-10-CM | POA: Diagnosis present

## 2023-09-02 DIAGNOSIS — Z8673 Personal history of transient ischemic attack (TIA), and cerebral infarction without residual deficits: Secondary | ICD-10-CM | POA: Diagnosis not present

## 2023-09-02 DIAGNOSIS — J410 Simple chronic bronchitis: Secondary | ICD-10-CM | POA: Diagnosis present

## 2023-09-02 DIAGNOSIS — Z91199 Patient's noncompliance with other medical treatment and regimen due to unspecified reason: Secondary | ICD-10-CM | POA: Diagnosis not present

## 2023-09-02 DIAGNOSIS — J189 Pneumonia, unspecified organism: Secondary | ICD-10-CM | POA: Diagnosis present

## 2023-09-02 DIAGNOSIS — Z9221 Personal history of antineoplastic chemotherapy: Secondary | ICD-10-CM

## 2023-09-02 DIAGNOSIS — Z923 Personal history of irradiation: Secondary | ICD-10-CM

## 2023-09-02 DIAGNOSIS — F39 Unspecified mood [affective] disorder: Secondary | ICD-10-CM | POA: Diagnosis present

## 2023-09-02 DIAGNOSIS — Z8614 Personal history of Methicillin resistant Staphylococcus aureus infection: Secondary | ICD-10-CM

## 2023-09-02 DIAGNOSIS — J440 Chronic obstructive pulmonary disease with acute lower respiratory infection: Secondary | ICD-10-CM | POA: Diagnosis present

## 2023-09-02 DIAGNOSIS — J90 Pleural effusion, not elsewhere classified: Secondary | ICD-10-CM | POA: Diagnosis present

## 2023-09-02 DIAGNOSIS — Z7982 Long term (current) use of aspirin: Secondary | ICD-10-CM

## 2023-09-02 DIAGNOSIS — J910 Malignant pleural effusion: Secondary | ICD-10-CM | POA: Diagnosis present

## 2023-09-02 DIAGNOSIS — G629 Polyneuropathy, unspecified: Secondary | ICD-10-CM | POA: Diagnosis present

## 2023-09-02 DIAGNOSIS — Z8711 Personal history of peptic ulcer disease: Secondary | ICD-10-CM

## 2023-09-02 DIAGNOSIS — C3411 Malignant neoplasm of upper lobe, right bronchus or lung: Principal | ICD-10-CM | POA: Diagnosis present

## 2023-09-02 DIAGNOSIS — C7800 Secondary malignant neoplasm of unspecified lung: Secondary | ICD-10-CM | POA: Diagnosis present

## 2023-09-02 DIAGNOSIS — Z79899 Other long term (current) drug therapy: Secondary | ICD-10-CM

## 2023-09-02 DIAGNOSIS — I10 Essential (primary) hypertension: Secondary | ICD-10-CM | POA: Diagnosis present

## 2023-09-02 DIAGNOSIS — Z85118 Personal history of other malignant neoplasm of bronchus and lung: Secondary | ICD-10-CM | POA: Diagnosis not present

## 2023-09-02 DIAGNOSIS — J9601 Acute respiratory failure with hypoxia: Secondary | ICD-10-CM | POA: Diagnosis present

## 2023-09-02 DIAGNOSIS — K219 Gastro-esophageal reflux disease without esophagitis: Secondary | ICD-10-CM | POA: Diagnosis present

## 2023-09-02 DIAGNOSIS — F17210 Nicotine dependence, cigarettes, uncomplicated: Secondary | ICD-10-CM | POA: Diagnosis present

## 2023-09-02 LAB — CBC WITH DIFFERENTIAL/PLATELET
Abs Immature Granulocytes: 0.14 10*3/uL — ABNORMAL HIGH (ref 0.00–0.07)
Basophils Absolute: 0 10*3/uL (ref 0.0–0.1)
Basophils Relative: 0 %
Eosinophils Absolute: 0 10*3/uL (ref 0.0–0.5)
Eosinophils Relative: 0 %
HCT: 40.9 % (ref 39.0–52.0)
Hemoglobin: 13 g/dL (ref 13.0–17.0)
Immature Granulocytes: 1 %
Lymphocytes Relative: 8 %
Lymphs Abs: 1.4 10*3/uL (ref 0.7–4.0)
MCH: 28.2 pg (ref 26.0–34.0)
MCHC: 31.8 g/dL (ref 30.0–36.0)
MCV: 88.7 fL (ref 80.0–100.0)
Monocytes Absolute: 1.1 10*3/uL — ABNORMAL HIGH (ref 0.1–1.0)
Monocytes Relative: 7 %
Neutro Abs: 13.4 10*3/uL — ABNORMAL HIGH (ref 1.7–7.7)
Neutrophils Relative %: 84 %
Platelets: 295 10*3/uL (ref 150–400)
RBC: 4.61 MIL/uL (ref 4.22–5.81)
RDW: 15.4 % (ref 11.5–15.5)
WBC: 16.1 10*3/uL — ABNORMAL HIGH (ref 4.0–10.5)
nRBC: 0 % (ref 0.0–0.2)

## 2023-09-02 LAB — COMPREHENSIVE METABOLIC PANEL
ALT: 23 U/L (ref 0–44)
AST: 25 U/L (ref 15–41)
Albumin: 3.2 g/dL — ABNORMAL LOW (ref 3.5–5.0)
Alkaline Phosphatase: 61 U/L (ref 38–126)
Anion gap: 7 (ref 5–15)
BUN: 13 mg/dL (ref 8–23)
CO2: 37 mmol/L — ABNORMAL HIGH (ref 22–32)
Calcium: 8.7 mg/dL — ABNORMAL LOW (ref 8.9–10.3)
Chloride: 90 mmol/L — ABNORMAL LOW (ref 98–111)
Creatinine, Ser: 0.93 mg/dL (ref 0.61–1.24)
GFR, Estimated: >60 mL/min (ref >60)
Glucose, Bld: 99 mg/dL (ref 70–99)
Potassium: 4.6 mmol/L (ref 3.5–5.1)
Sodium: 134 mmol/L — ABNORMAL LOW (ref 135–145)
Total Bilirubin: 0.4 mg/dL (ref <1.2)
Total Protein: 6.8 g/dL (ref 6.5–8.1)

## 2023-09-02 LAB — BRAIN NATRIURETIC PEPTIDE: B Natriuretic Peptide: 76.3 pg/mL (ref 0.0–100.0)

## 2023-09-02 LAB — TROPONIN I (HIGH SENSITIVITY): Troponin I (High Sensitivity): 37 ng/L — ABNORMAL HIGH (ref <18)

## 2023-09-02 MED ORDER — ACETAMINOPHEN 650 MG RE SUPP: 650.0000 mg | Freq: Four times a day (QID) | RECTAL | Status: DC | PRN

## 2023-09-02 MED ORDER — ASPIRIN 81 MG PO TBEC
81.0000 mg | DELAYED_RELEASE_TABLET | Freq: Every day | ORAL | Status: DC
  Administered 2023-09-02 – 2023-09-05 (×4): 81 mg via ORAL
  Filled 2023-09-02 (×4): qty 1

## 2023-09-02 MED ORDER — METOPROLOL SUCCINATE ER 25 MG PO TB24
25.0000 mg | ORAL_TABLET | Freq: Every day | ORAL | Status: DC
  Administered 2023-09-03 – 2023-09-05 (×3): 25 mg via ORAL
  Filled 2023-09-02 (×3): qty 1

## 2023-09-02 MED ORDER — TEMAZEPAM 15 MG PO CAPS
30.0000 mg | ORAL_CAPSULE | Freq: Every evening | ORAL | Status: DC | PRN
Start: 2023-09-02 — End: 2023-09-05

## 2023-09-02 MED ORDER — OMEGA-3-ACID ETHYL ESTERS 1 G PO CAPS
1.0000 | ORAL_CAPSULE | Freq: Every day | ORAL | Status: DC
  Administered 2023-09-03 – 2023-09-05 (×3): 1 g via ORAL
  Filled 2023-09-02 (×4): qty 1

## 2023-09-02 MED ORDER — SODIUM CHLORIDE 0.9 % IV SOLN
3.0000 g | Freq: Four times a day (QID) | INTRAVENOUS | Status: DC
  Administered 2023-09-02 – 2023-09-04 (×6): 3 g via INTRAVENOUS
  Filled 2023-09-02 (×8): qty 8

## 2023-09-02 MED ORDER — SODIUM CHLORIDE 0.9% FLUSH
3.0000 mL | Freq: Two times a day (BID) | INTRAVENOUS | Status: DC
  Administered 2023-09-02 – 2023-09-05 (×5): 3 mL via INTRAVENOUS

## 2023-09-02 MED ORDER — AMITRIPTYLINE HCL 50 MG PO TABS
100.0000 mg | ORAL_TABLET | Freq: Every day | ORAL | Status: DC
  Administered 2023-09-03 – 2023-09-04 (×2): 100 mg via ORAL
  Filled 2023-09-02: qty 1
  Filled 2023-09-02 (×2): qty 2

## 2023-09-02 MED ORDER — GABAPENTIN 400 MG PO CAPS
600.0000 mg | ORAL_CAPSULE | Freq: Two times a day (BID) | ORAL | Status: DC
  Administered 2023-09-02 – 2023-09-05 (×6): 600 mg via ORAL
  Filled 2023-09-02 (×6): qty 2

## 2023-09-02 MED ORDER — MIRTAZAPINE 15 MG PO TABS
30.0000 mg | ORAL_TABLET | Freq: Every day | ORAL | Status: DC
  Administered 2023-09-02 – 2023-09-04 (×3): 30 mg via ORAL
  Filled 2023-09-02 (×3): qty 2

## 2023-09-02 MED ORDER — POLYETHYLENE GLYCOL 3350 17 G PO PACK: 17.0000 g | PACK | Freq: Every day | ORAL | Status: DC | PRN

## 2023-09-02 MED ORDER — ENOXAPARIN SODIUM 40 MG/0.4ML IJ SOSY
40.0000 mg | PREFILLED_SYRINGE | INTRAMUSCULAR | Status: DC
  Administered 2023-09-04 – 2023-09-05 (×2): 40 mg via SUBCUTANEOUS
  Filled 2023-09-02 (×3): qty 0.4

## 2023-09-02 MED ORDER — FINASTERIDE 5 MG PO TABS
5.0000 mg | ORAL_TABLET | Freq: Every day | ORAL | Status: DC
  Administered 2023-09-02 – 2023-09-05 (×4): 5 mg via ORAL
  Filled 2023-09-02 (×4): qty 1

## 2023-09-02 MED ORDER — LISINOPRIL 5 MG PO TABS
2.5000 mg | ORAL_TABLET | Freq: Every day | ORAL | Status: DC
  Administered 2023-09-03 – 2023-09-05 (×3): 2.5 mg via ORAL
  Filled 2023-09-02 (×3): qty 1

## 2023-09-02 MED ORDER — PANTOPRAZOLE SODIUM 40 MG PO TBEC
40.0000 mg | DELAYED_RELEASE_TABLET | Freq: Every day | ORAL | Status: DC
  Administered 2023-09-03 – 2023-09-05 (×3): 40 mg via ORAL
  Filled 2023-09-02 (×3): qty 1

## 2023-09-02 MED ORDER — SIMVASTATIN 20 MG PO TABS
20.0000 mg | ORAL_TABLET | Freq: Every day | ORAL | Status: DC
  Administered 2023-09-03 – 2023-09-05 (×3): 20 mg via ORAL
  Filled 2023-09-02 (×3): qty 1

## 2023-09-02 MED ORDER — ALBUTEROL SULFATE HFA 108 (90 BASE) MCG/ACT IN AERS: 2.0000 | INHALATION_SPRAY | Freq: Four times a day (QID) | RESPIRATORY_TRACT | Status: DC | PRN

## 2023-09-02 MED ORDER — ACETAMINOPHEN 325 MG PO TABS: 650.0000 mg | ORAL_TABLET | Freq: Four times a day (QID) | ORAL | Status: DC | PRN

## 2023-09-02 MED ORDER — NORTRIPTYLINE HCL 10 MG PO CAPS
10.0000 mg | ORAL_CAPSULE | Freq: Two times a day (BID) | ORAL | Status: DC
  Administered 2023-09-02: 10 mg via ORAL
  Filled 2023-09-02 (×2): qty 1

## 2023-09-02 NOTE — H&P (Signed)
History and Physical    Patient: [name] [name] XLK:[id number] DOB: 04-Nov-1950 DOA: 09/02/2023 DOS: the patient was seen and examined on 09/02/2023 PCP: Gemma Payor, MD  Patient coming from: Home>PCP office> [facility] ER  Chief Complaint:  Chief Complaint  Patient presents with   Shortness of Breath   HPI: [name] [name] is a 72 y.o. male with medical history significant of  Stage IV (T3, N0, M1 a) non-small cell lung cancer, squamous cell carcinoma presented with multiple bilateral pulmonary nodules diagnosed in April 2018.  Patient has previously completed chemotherapy with carboplatin and paclitaxel that was tolerated well for 6 cycles.  Subsequently patient tolerated immunotherapy with nivolumab.  But since then patient has been observation for this diagnosis.  Marland Kitchenwith a new putative metachronous Stage IA3 non-small cell carcinoma of the right upper lung for which she is getting radiation therapy with curative intent  Patient was in his usual state of health till approximately 10 days ago when he reports a new onset of insidious onset of some shortness of breath with exertion.  That has been progressive since then and now he is feeling short of breath even at rest.  There is no report of chest pain, trauma, fever, leg swelling, cramping.  Patient initially presented to his PCP where given patient's overall condition, patient was directed to ER, patient actually drove almost an hour to get to [facility] ER because all of his care is in the [facility] health system.  ER finding is notable for patient being hypoxic to mid 80% on room air, now stabilized on 3 L/min of supplementary oxygen.  Patient currently reports no further symptoms.  Medical evaluation is sought.  Workup as noted below.   Review of Systems: As mentioned in the history of present illness. All other systems reviewed and are negative. Past Medical History:  Diagnosis Date   AAA (abdominal aortic aneurysm) (HCC)    per CT in epic 10-17-2020  3.6cm   Bilateral iliac artery aneurysm (HCC)    per CT in epic 10-17-2020  1.9cm   Carotid occlusion, right    with infarction 2017   Chronic diarrhea    COPD (chronic obstructive pulmonary disease) (HCC)    previously has seen pulmonolgy, dr Demetrius Charity. mannan (lov 09-29-2018 in epic) ;   (12-19-2020  pt stated no oxygen, uses rescue inhaler as needed, and sob with stairs)   Depression    Diverticulosis of colon    DOE (dyspnea on exertion)    per pt sob w/ stairs and recovers after few minutes   Dyspnea    GERD (gastroesophageal reflux disease)    History of antineoplastic chemotherapy 01/2017  to 09/ 2019   lung cancer   History of cerebrovascular accident (CVA) from right carotid artery occlusion involving right middle cerebral artery territory 2018     (last neurology note 11-14-2017 pt released on prn basis back to pcp follow up)   neurologist--- dr v. Marjory Lies--- left hand weakness/ numbing intermittantly in setting of lung cancer, MRI 12/ 2017 right frontal ishcemic infartions(watershed vs. embolic infarts due to right ICA stenosis or occlusion vs. cerebral metastateses) confirmed by duplex occluded right ICA  (03-04-20022 pt stated no residual)   History of duodenal ulcer 02/2017   perforated pylori channel ulcer s/p surgery , modified graham's patch   History of MRSA infection    remote   History of radiation therapy 08/2018   SRBT left upper lobe   History of sepsis 05/07/2019  hospital admission pt dehydrated with AKI--- resolved   Hypertension    Left carotid stenosis    per duplex 40-59%   Metastatic squamous cell carcinoma to lung, unspecified laterality [facility]) oncologist-- dr Ardelle Anton   dx 04/ 2018 , bronchoscopy w/ bx's for bilateral pulm. nodules,  Stage 4 non-small cell ; completed chemo 09/ 2019 and had immunotherapy and SBRT to left upper lobe 11/ 2019   Peripheral neuropathy due to chemotherapy [facility])    Right inguinal hernia    Smokers' cough (HCC)    per pt mostly  productive in am    Wears glasses    Past Surgical History:  Procedure Laterality Date   COLONOSCOPY     INGUINAL HERNIA REPAIR Right 12/25/2020   Procedure: RIGHT OPEN INGUINAL HERNIA REPAIR WITH MESH;  Surgeon: Kinsinger, De Blanch, MD;  Location: [facility] [place];  Service: General;  Laterality: Right;  ROOM 3 STARTING AT 09:30AM FOR 1 HOUR   LAPAROTOMY N/A 02/25/2017   Procedure: EXPLORATORY LAPAROTOMY MODIFIED GRAHAM'S PATCH;  Surgeon: Rodman Pickle, MD;  Location: [facility];  Service: General;  Laterality: N/A;   MINOR PLACEMENT OF FIDUCIAL N/A 01/19/2017   Procedure: MINOR PLACEMENT OF FIDUCIAL x 6;  Surgeon: Leslye Peer, MD;  Location: [facility] OR;  Service: Thoracic;  Laterality: N/A;   PILONIDAL CYST EXCISION  2019   VIDEO BRONCHOSCOPY WITH ENDOBRONCHIAL NAVIGATION N/A 01/19/2017   Procedure: VIDEO BRONCHOSCOPY WITH ENDOBRONCHIAL NAVIGATION;  Surgeon: Leslye Peer, MD;  Location: [facility] OR;  Service: Thoracic;  Laterality: N/A;   Social History:  reports that he has been smoking cigarettes. He has a 52 pack-year smoking history. He has never used smokeless tobacco. He reports that he does not currently use drugs. He reports that he does not drink alcohol.  No Known Allergies  Family History  Problem Relation Age of Onset   Cancer Father        unsure of what type    Prior to Admission medications   Medication Sig Start Date End Date Taking? Authorizing Provider  acetaminophen (TYLENOL) 325 MG tablet You can take 2 every 4 hours as needed.  You can buy it over the counter DO NOT TAKE MORE THAN 4000 MG OF TYLENOL PER DAY.  IT CAN HARM YOUR LIVER. 03/03/17  Yes Sherrie George, PA-C  albuterol (VENTOLIN HFA) 108 (90 Base) MCG/ACT inhaler Inhale into the lungs every 6 (six) hours as needed for wheezing or shortness of breath.   Yes [provider]  amitriptyline (ELAVIL) 100 MG tablet Take 1 tablet (100 mg total) by mouth at bedtime. 05/01/20  Yes Heilingoetter,  Cassandra L, PA-C  aspirin EC 81 MG tablet Take 81 mg by mouth daily.   Yes [provider]  benzonatate (TESSALON) 100 MG capsule Take 100 mg by mouth 3 (three) times daily as needed for cough. 08/16/23  Yes [provider]  finasteride (PROSCAR) 5 MG tablet Take 5 mg by mouth daily. 08/16/23  Yes [provider]  gabapentin (NEURONTIN) 300 MG capsule TAKE 2 CAPSULES BY MOUTH THREE TIMES DAILY Patient taking differently: Take 600 mg by mouth 2 (two) times daily. 05/05/20  Yes Vaslow, Georgeanna Lea, MD  lisinopril (ZESTRIL) 2.5 MG tablet Take 2.5 mg by mouth daily.   Yes [provider]  metoprolol succinate (TOPROL-XL) 25 MG 24 hr tablet Take 1 tablet (25 mg total) by mouth daily. 05/10/19  Yes Rai, Ripudeep K, MD  mirtazapine (REMERON) 30 MG tablet TAKE  1 TABLET BY MOUTH AT BEDTIME Patient taking differently: Take 30 mg by mouth at bedtime. 11/30/19  Yes Si Gaul, MD  nortriptyline (PAMELOR) 10 MG capsule Take 10 mg by mouth 2 (two) times daily. 11/18/18  Yes [provider]  Omega-3 Fatty Acids (OMEGA-3 FISH OIL PO) Take 1 capsule by mouth daily.   Yes [provider]  omeprazole (PRILOSEC) 20 MG capsule Take 20 mg by mouth 2 (two) times daily before a meal.   Yes [provider]  simvastatin (ZOCOR) 20 MG tablet Take 20 mg by mouth daily. 10/30/18  Yes [provider]  temazepam (RESTORIL) 30 MG capsule Take 1 capsule (30 mg total) by mouth at bedtime. Patient taking differently: Take 30 mg by mouth at bedtime. 05/01/20  Yes Heilingoetter, Cassandra L, PA-C  loperamide (IMODIUM) 2 MG capsule Take 1 capsule (2 mg total) by mouth 2 (two) times daily. Also available over the counter. You can take 1 extra dose in the afternoon if diarrhea is not improving. Patient not taking: Reported on 09/02/2023 05/08/19   Rai, Delene Ruffini, MD  tamsulosin (FLOMAX) 0.4 MG CAPS capsule Take 0.4 mg by mouth daily. Patient not taking: Reported on  09/02/2023 04/07/23   [provider]    Physical Exam: Vitals:   09/02/23 1651 09/02/23 1700 09/02/23 1701  BP: 132/88 (!) 152/99   Pulse: (!) 107 99   Resp: 15 (!) 24   Temp: 98.3 F (36.8 C)    TempSrc: Oral    SpO2: (!) 80% 90%   Weight:   56.7 kg  Height:   5\' 8"  (1.727 m)   General: Patient is alert awake oriented x 3 no distress apparent Respiratory exam: Diffusely diminished breath sounds however more so in the left lower lung field and left interscapular area with dullness to percussion Cardiovascular exam S1-S2 normal Abdomen all quadrants soft nontender Extremities warm without edema. Data Reviewed:  Labs on Admission:  Results for orders placed or performed during the hospital encounter of 09/02/23 (from the past 24 hour(s))  CBC with Differential     Status: Abnormal   Collection Time: 09/02/23  5:34 PM  Result Value Ref Range   WBC 16.1 (H) 4.0 - 10.5 K/uL   RBC 4.61 4.22 - 5.81 MIL/uL   Hemoglobin 13.0 13.0 - 17.0 g/dL   HCT 08.6 57.8 - 46.9 %   MCV 88.7 80.0 - 100.0 fL   MCH 28.2 26.0 - 34.0 pg   MCHC 31.8 30.0 - 36.0 g/dL   RDW 62.9 52.8 - 41.3 %   Platelets 295 150 - 400 K/uL   nRBC 0.0 0.0 - 0.2 %   Neutrophils Relative % 84 %   Neutro Abs 13.4 (H) 1.7 - 7.7 K/uL   Lymphocytes Relative 8 %   Lymphs Abs 1.4 0.7 - 4.0 K/uL   Monocytes Relative 7 %   Monocytes Absolute 1.1 (H) 0.1 - 1.0 K/uL   Eosinophils Relative 0 %   Eosinophils Absolute 0.0 0.0 - 0.5 K/uL   Basophils Relative 0 %   Basophils Absolute 0.0 0.0 - 0.1 K/uL   Immature Granulocytes 1 %   Abs Immature Granulocytes 0.14 (H) 0.00 - 0.07 K/uL  Comprehensive metabolic panel     Status: Abnormal   Collection Time: 09/02/23  5:34 PM  Result Value Ref Range   Sodium 134 (L) 135 - 145 mmol/L   Potassium 4.6 3.5 - 5.1 mmol/L   Chloride 90 (L) 98 - 111 mmol/L  CO2 37 (H) 22 - 32 mmol/L   Glucose, Bld 99 70 - 99 mg/dL   BUN 13 8 - 23 mg/dL   Creatinine, Ser 1.61 0.61 - 1.24 mg/dL    Calcium 8.7 (L) 8.9 - 10.3 mg/dL   Total Protein 6.8 6.5 - 8.1 g/dL   Albumin 3.2 (L) 3.5 - 5.0 g/dL   AST 25 15 - 41 U/L   ALT 23 0 - 44 U/L   Alkaline Phosphatase 61 38 - 126 U/L   Total Bilirubin 0.4 <1.2 mg/dL   GFR, Estimated >09 >60 mL/min   Anion gap 7 5 - 15  Troponin I (High Sensitivity)     Status: Abnormal   Collection Time: 09/02/23  5:34 PM  Result Value Ref Range   Troponin I (High Sensitivity) 37 (H) <18 ng/L  Brain natriuretic peptide     Status: None   Collection Time: 09/02/23  5:34 PM  Result Value Ref Range   B Natriuretic Peptide 76.3 0.0 - 100.0 pg/mL   Basic Metabolic Panel: Recent Labs  Lab 09/02/23 1734  NA 134*  K 4.6  CL 90*  CO2 37*  GLUCOSE 99  BUN 13  CREATININE 0.93  CALCIUM 8.7*   Liver Function Tests: Recent Labs  Lab 09/02/23 1734  AST 25  ALT 23  ALKPHOS 61  BILITOT 0.4  PROT 6.8  ALBUMIN 3.2*   No results for input(s): "LIPASE", "AMYLASE" in the last 168 hours. No results for input(s): "AMMONIA" in the last 168 hours. CBC: Recent Labs  Lab 09/02/23 1734  WBC 16.1*  NEUTROABS 13.4*  HGB 13.0  HCT 40.9  MCV 88.7  PLT 295   Cardiac Enzymes: Recent Labs  Lab 09/02/23 1734  TROPONINIHS 37*    BNP (last 3 results) No results for input(s): "PROBNP" in the last 8760 hours. CBG: No results for input(s): "GLUCAP" in the last 168 hours.  Radiological Exams on Admission:  DG Chest Port 1 View  Result Date: 09/02/2023 CLINICAL DATA:  SOB. EXAM: PORTABLE CHEST 1 VIEW COMPARISON:  05/07/2019. FINDINGS: Large left-sided pleural effusion. Cardiac silhouette is obscured. Right lung is clear. Aorta is ectatic and calcified. No pneumothorax. Normal pulmonary vasculature. Thoracic degenerative changes. IMPRESSION: Large left-sided pleural effusion. Electronically Signed   By: Layla Maw M.D.   On: 09/02/2023 19:22       Assessment and Plan: Pleural effusion New, submassive in the left lung field.  Although patient  is not having any fevers, marked leukocytosis is noted, this is new, patient is immunocompromised.  At this time I will confirm pleural effusion diagnosis with CAT scan, I will request IR evaluation for tunneled versus nontunneled pleural effusion drainage.  In the interim I will draw blood cultures and treat the patient with Unasyn for rule out pneumonia.  Patient does have mild hypoxemia on evaluation, patient is requiring new 3 L/min of supplementary oxygen which shall be continued.  Metastatic squamous cell carcinoma to lung, unspecified laterality (HCC)  Stage IV (T3, N0, M1 a) non-small cell lung cancer, squamous cell carcinoma presented with multiple bilateral pulmonary nodules diagnosed in April 2018.  Patient has previously completed chemotherapy with carboplatin and paclitaxel that was tolerated well for 6 cycles.  Subsequently patient tolerated immunotherapy with nivolumab.  But since then patient has been observation for this diagnosis.  Per radiation onc: with a new putative metachronous Stage IA3 non-small cell carcinoma of the right upper lung for which she is getting radiation therapy with curative intent  Advance Care Planning:   Code Status: Prior and wishes to be full code  Consults: IR evaluation is requested  Family Communication: Per patient  Severity of Illness: The appropriate patient status for this patient is INPATIENT. Inpatient status is judged to be reasonable and necessary in order to provide the required intensity of service to ensure the patient's safety. The patient's presenting symptoms, physical exam findings, and initial radiographic and laboratory data in the context of their chronic comorbidities is felt to place them at high risk for further clinical deterioration. Furthermore, it is not anticipated that the patient will be medically stable for discharge from the hospital within 2 midnights of admission.   * I certify that at the point of admission it is  my clinical judgment that the patient will require inpatient hospital care spanning beyond 2 midnights from the point of admission due to high intensity of service, high risk for further deterioration and high frequency of surveillance required.*  Author: Nolberto Hanlon, MD 09/02/2023 8:19 PM  For on call review [email or web address].

## 2023-09-02 NOTE — ED Provider Notes (Signed)
[place] EMERGENCY DEPARTMENT AT [facility] Provider Note   CSN: [id number] Arrival date & time: 09/02/23  1633     History  Chief Complaint  Patient presents with   Shortness of Breath    [name] [name] is a 72 y.o. male.  72 yo M with a chief complaint of difficulty breathing.  This has been an ongoing issue.  The patient unfortunately has stage IV lung cancer he is going through radiation therapy.  He saw his family doctor who was concerned that he developed new pleural effusion.  He gets all of his cancer care here and elected to come here to be evaluated.  He denies any fevers.  Denies chest pain.   Shortness of Breath      Home Medications Prior to Admission medications   Medication Sig Start Date End Date Taking? Authorizing Provider  acetaminophen (TYLENOL) 325 MG tablet You can take 2 every 4 hours as needed.  You can buy it over the counter DO NOT TAKE MORE THAN 4000 MG OF TYLENOL PER DAY.  IT CAN HARM YOUR LIVER. 03/03/17   Sherrie George, PA-C  albuterol (VENTOLIN HFA) 108 (90 Base) MCG/ACT inhaler Inhale into the lungs every 6 (six) hours as needed for wheezing or shortness of breath.    [provider]  albuterol (VENTOLIN HFA) 108 (90 Base) MCG/ACT inhaler Inhale into the lungs every 6 (six) hours as needed for wheezing or shortness of breath.    [provider]  amitriptyline (ELAVIL) 100 MG tablet Take 1 tablet (100 mg total) by mouth at bedtime. 05/01/20   Heilingoetter, Cassandra L, PA-C  aspirin EC 81 MG tablet Take 81 mg by mouth daily.    [provider]  gabapentin (NEURONTIN) 300 MG capsule TAKE 2 CAPSULES BY MOUTH THREE TIMES DAILY Patient taking differently: Take 600 mg by mouth 3 (three) times daily. 05/05/20   Henreitta Leber, MD  lisinopril (ZESTRIL) 2.5 MG tablet Take 2.5 mg by mouth daily.    [provider]  loperamide (IMODIUM) 2 MG capsule Take 1 capsule (2 mg total) by mouth 2 (two) times daily.  Also available over the counter. You can take 1 extra dose in the afternoon if diarrhea is not improving. 05/08/19   Rai, Ripudeep K, MD  mesalamine (APRISO) 0.375 g 24 hr capsule Take 4 capsules by mouth daily. Patient not taking: No sig reported 03/21/19   Fran Lowes, MD  metoprolol succinate (TOPROL-XL) 25 MG 24 hr tablet Take 1 tablet (25 mg total) by mouth daily. 05/10/19   Rai, Ripudeep K, MD  mirtazapine (REMERON) 30 MG tablet TAKE 1 TABLET BY MOUTH AT BEDTIME Patient taking differently: Take 30 mg by mouth at bedtime. 11/30/19   Si Gaul, MD  nortriptyline (PAMELOR) 10 MG capsule Take 10 mg by mouth 2 (two) times daily. 11/18/18   [provider]  Omega-3 Fatty Acids (OMEGA-3 FISH OIL PO) Take 1 capsule by mouth daily.    [provider]  omeprazole (PRILOSEC) 20 MG capsule Take 20 mg by mouth 2 (two) times daily before a meal.    [provider]  oxyCODONE (OXY IR/ROXICODONE) 5 MG immediate release tablet Take 1 tablet (5 mg total) by mouth every 6 (six) hours as needed for severe pain. 12/25/20   Kinsinger, De Blanch, MD  simvastatin (ZOCOR) 20 MG tablet Take 20 mg by mouth daily. 10/30/18   [provider]  tamsulosin (FLOMAX) 0.4 MG CAPS capsule Take  0.4 mg by mouth daily. 04/07/23   [provider]  temazepam (RESTORIL) 30 MG capsule Take 1 capsule (30 mg total) by mouth at bedtime. Patient taking differently: Take 30 mg by mouth at bedtime. 05/01/20   Heilingoetter, Cassandra L, PA-C      Allergies    Patient has no known allergies.    Review of Systems   Review of Systems  Respiratory:  Positive for shortness of breath.     Physical Exam Updated Vital Signs BP (!) 152/99   Pulse 99   Temp 98.3 F (36.8 C) (Oral)   Resp (!) 24   Ht 5\' 8"  (1.727 m)   Wt 56.7 kg   SpO2 90%   BMI 19.01 kg/m  Physical Exam Vitals and nursing note reviewed.  Constitutional:      Appearance: He is well-developed.  HENT:     Head:  Normocephalic and atraumatic.  Eyes:     Pupils: Pupils are equal, round, and reactive to light.  Neck:     Vascular: No JVD.  Cardiovascular:     Rate and Rhythm: Normal rate and regular rhythm.     Heart sounds: No murmur heard.    No friction rub. No gallop.  Pulmonary:     Effort: No respiratory distress.     Breath sounds: Decreased breath sounds present. No wheezing.     Comments: Diminished breath sounds to bilateral bases worse on the left than the right.  He has dullness to percussion on the left lung fields up to the scapula. Abdominal:     General: There is no distension.     Tenderness: There is no abdominal tenderness. There is no guarding or rebound.  Musculoskeletal:        General: Normal range of motion.     Cervical back: Normal range of motion and neck supple.  Skin:    Coloration: Skin is not pale.     Findings: No rash.  Neurological:     Mental Status: He is alert and oriented to person, place, and time.  Psychiatric:        Behavior: Behavior normal.     ED Results / Procedures / Treatments   Labs (all labs ordered are listed, but only abnormal results are displayed) Labs Reviewed  CBC WITH DIFFERENTIAL/PLATELET - Abnormal; Notable for the following components:      Result Value   WBC 16.1 (*)    Neutro Abs 13.4 (*)    Monocytes Absolute 1.1 (*)    Abs Immature Granulocytes 0.14 (*)    All other components within normal limits  COMPREHENSIVE METABOLIC PANEL - Abnormal; Notable for the following components:   Sodium 134 (*)    Chloride 90 (*)    CO2 37 (*)    Calcium 8.7 (*)    Albumin 3.2 (*)    All other components within normal limits  TROPONIN I (HIGH SENSITIVITY) - Abnormal; Notable for the following components:   Troponin I (High Sensitivity) 37 (*)    All other components within normal limits  BRAIN NATRIURETIC PEPTIDE    EKG None  Radiology No results found.  Procedures .Critical Care  Performed by: Melene Plan, DO Authorized  by: Melene Plan, DO   Critical care provider statement:    Critical care time (minutes):  35   Critical care time was exclusive of:  Separately billable procedures and treating other patients   Critical care was time spent personally by me on the following activities:  Development of treatment plan with patient or surrogate, discussions with consultants, evaluation of patient's response to treatment, examination of patient, ordering and review of laboratory studies, ordering and review of radiographic studies, ordering and performing treatments and interventions, pulse oximetry, re-evaluation of patient's condition and review of old charts   Care discussed with: admitting provider       Medications Ordered in ED Medications - No data to display  ED Course/ Medical Decision Making/ A&P                                 Medical Decision Making Amount and/or Complexity of Data Reviewed Labs: ordered. Radiology: ordered. ECG/medicine tests: ordered.   72 yo M with a chief complaints of difficulty breathing.  The patient unfortunately has stage IV lung cancer.  Had developed pleural effusion.  Patient is requiring oxygen not on oxygen at home.  Will obtain a chest x-ray blood work.  Will discuss with medicine for admission.  Patient does have a fairly large left-sided pleural effusion on my independent interpretation of his x-ray.  He is comfortable on 3 L of oxygen nasal cannula.  No acute anemia no significant electrolyte abnormalities.  Does have a mild leukocytosis.  Will discuss with medicine for admission.  The patients results and plan were reviewed and discussed.   Any x-rays performed were independently reviewed by myself.   Differential diagnosis were considered with the presenting HPI.  Medications - No data to display  Vitals:   09/02/23 1651 09/02/23 1700 09/02/23 1701  BP: 132/88 (!) 152/99   Pulse: (!) 107 99   Resp: 15 (!) 24   Temp: 98.3 F (36.8 C)    TempSrc:  Oral    SpO2: (!) 80% 90%   Weight:   56.7 kg  Height:   5\' 8"  (1.727 m)    Final diagnoses:  Pleural effusion    Admission/ observation were discussed with the admitting physician, patient and/or family and they are comfortable with the plan.          Final Clinical Impression(s) / ED Diagnoses Final diagnoses:  Pleural effusion    Rx / DC Orders ED Discharge Orders     None         Melene Plan, DO 09/02/23 1903

## 2023-09-02 NOTE — Assessment & Plan Note (Signed)
New, submassive in the left lung field.  Although patient is not having any fevers, marked leukocytosis is noted, this is new, patient is immunocompromised.  At this time I will confirm pleural effusion diagnosis with CAT scan, I will request IR evaluation for tunneled versus nontunneled pleural effusion drainage.  In the interim I will draw blood cultures and treat the patient with Unasyn for rule out pneumonia.  Patient does have mild hypoxemia on evaluation, patient is requiring new 3 L/min of supplementary oxygen which shall be continued.

## 2023-09-02 NOTE — Assessment & Plan Note (Signed)
Stage IV (T3, N0, M1 a) non-small cell lung cancer, squamous cell carcinoma presented with multiple bilateral pulmonary nodules diagnosed in April 2018.  Patient has previously completed chemotherapy with carboplatin and paclitaxel that was tolerated well for 6 cycles.  Subsequently patient tolerated immunotherapy with nivolumab.  But since then patient has been observation for this diagnosis.  Per radiation onc: with a new putative metachronous Stage IA3 non-small cell carcinoma of the right upper lung for which she is getting radiation therapy with curative intent

## 2023-09-02 NOTE — ED Triage Notes (Signed)
Patient sent from PCP. Patient has hx of lung cancer. Reports increased SOB and new use of oxygen. Patient has had thoracentesis previously.

## 2023-09-02 NOTE — Progress Notes (Signed)
Pharmacy Antibiotic Note  [name] [name] is a 72 y.o. male admitted on 09/02/2023 with shortness of breath. Pharmacy has been consulted for Unasyn dosing for pneumonia.  Plan: -Unasyn 3 g IV q6h -Continue to follow renal function, cultures and clinical progress for dose adjustments and de-escalation as indicated  Height: 5\' 8"  (172.7 cm) Weight: 56.7 kg (125 lb) IBW/kg (Calculated) : 68.4  Temp (24hrs), Avg:98.3 F (36.8 C), Min:98.3 F (36.8 C), Max:98.3 F (36.8 C)  Recent Labs  Lab 09/02/23 1734  WBC 16.1*  CREATININE 0.93    Estimated Creatinine Clearance: 59.3 mL/min (by C-G formula based on SCr of 0.93 mg/dL).    No Known Allergies  Antimicrobials this admission: Unasyn 11/15 >>  Microbiology results: 11/15 BCx: pending   Thank you for allowing pharmacy to be a part of this patient's care.   Pricilla Riffle, PharmD, BCPS Clinical Pharmacist 09/02/2023 8:31 PM

## 2023-09-02 NOTE — Progress Notes (Signed)
Patient up out of bed with oxygen off and SCD disconnected. Stated," I need to put my shirt back on, I am too cold to be without a shirt". Patient took off his hospital gown,and placed his tee shirt on. I explained to patient that he needs to use the call bell for help because of the risk of falling. Plan of care ongoing.

## 2023-09-03 ENCOUNTER — Inpatient Hospital Stay (HOSPITAL_COMMUNITY): Payer: Medicare HMO

## 2023-09-03 DIAGNOSIS — J90 Pleural effusion, not elsewhere classified: Secondary | ICD-10-CM | POA: Diagnosis not present

## 2023-09-03 LAB — CBC
HCT: 40.7 % (ref 39.0–52.0)
Hemoglobin: 12.7 g/dL — ABNORMAL LOW (ref 13.0–17.0)
MCH: 27.9 pg (ref 26.0–34.0)
MCHC: 31.2 g/dL (ref 30.0–36.0)
MCV: 89.5 fL (ref 80.0–100.0)
Platelets: 265 10*3/uL (ref 150–400)
RBC: 4.55 MIL/uL (ref 4.22–5.81)
RDW: 15.5 % (ref 11.5–15.5)
WBC: 13.5 10*3/uL — ABNORMAL HIGH (ref 4.0–10.5)
nRBC: 0 % (ref 0.0–0.2)

## 2023-09-03 LAB — BASIC METABOLIC PANEL
Anion gap: 8 (ref 5–15)
BUN: 11 mg/dL (ref 8–23)
CO2: 35 mmol/L — ABNORMAL HIGH (ref 22–32)
Calcium: 8.3 mg/dL — ABNORMAL LOW (ref 8.9–10.3)
Chloride: 92 mmol/L — ABNORMAL LOW (ref 98–111)
Creatinine, Ser: 0.64 mg/dL (ref 0.61–1.24)
GFR, Estimated: >60 mL/min (ref >60)
Glucose, Bld: 92 mg/dL (ref 70–99)
Potassium: 4.1 mmol/L (ref 3.5–5.1)
Sodium: 135 mmol/L (ref 135–145)

## 2023-09-03 LAB — BODY FLUID CELL COUNT WITH DIFFERENTIAL
Lymphs, Fluid: 86 %
Monocyte-Macrophage-Serous Fluid: 9 % — ABNORMAL LOW (ref 50–90)
Neutrophil Count, Fluid: 5 % (ref 0–25)
Total Nucleated Cell Count, Fluid: 1110 uL — ABNORMAL HIGH (ref 0–1000)

## 2023-09-03 LAB — ALBUMIN, PLEURAL OR PERITONEAL FLUID: Albumin, Fluid: 2.1 g/dL

## 2023-09-03 LAB — PROTIME-INR
INR: 1 (ref 0.8–1.2)
Prothrombin Time: 13.5 s (ref 11.4–15.2)

## 2023-09-03 LAB — LACTATE DEHYDROGENASE, PLEURAL OR PERITONEAL FLUID: LD, Fluid: 1038 U/L

## 2023-09-03 LAB — APTT: aPTT: 28 s (ref 24–36)

## 2023-09-03 LAB — HIV ANTIBODY (ROUTINE TESTING W REFLEX): HIV Screen 4th Generation wRfx: NONREACTIVE

## 2023-09-03 MED ORDER — LIDOCAINE HCL 1 % IJ SOLN
INTRAMUSCULAR | Status: AC
  Filled 2023-09-03: qty 20

## 2023-09-03 MED ORDER — TAMSULOSIN HCL 0.4 MG PO CAPS
0.4000 mg | ORAL_CAPSULE | Freq: Every day | ORAL | Status: DC
  Administered 2023-09-03 – 2023-09-05 (×3): 0.4 mg via ORAL
  Filled 2023-09-03 (×3): qty 1

## 2023-09-03 MED ORDER — NICOTINE POLACRILEX 2 MG MT GUM
4.0000 mg | CHEWING_GUM | OROMUCOSAL | Status: DC | PRN
  Administered 2023-09-03: 4 mg via ORAL
  Filled 2023-09-03 (×2): qty 2

## 2023-09-03 MED ORDER — ENSURE ENLIVE PO LIQD
237.0000 mL | Freq: Three times a day (TID) | ORAL | Status: DC
  Administered 2023-09-03 – 2023-09-04 (×3): 237 mL via ORAL

## 2023-09-03 MED ORDER — NICOTINE 21 MG/24HR TD PT24
21.0000 mg | MEDICATED_PATCH | Freq: Every day | TRANSDERMAL | Status: DC
  Administered 2023-09-03 – 2023-09-05 (×3): 21 mg via TRANSDERMAL
  Filled 2023-09-03 (×3): qty 1

## 2023-09-03 MED ORDER — BENZONATATE 100 MG PO CAPS: 100.0000 mg | ORAL_CAPSULE | Freq: Three times a day (TID) | ORAL | Status: DC | PRN

## 2023-09-03 NOTE — Progress Notes (Signed)
  Radiation Oncology         [phone] ________________________________  Name: [name] [name] MRN: [id number]  Date: 09/02/2023  DOB: 10/15/1951  Chart Note:  I was contacted and notified of this patient admission.  He presented with dyspnea resulting from a large left pleural effusion.  He was treated in Aug-Sep with definitive SBRT to a right upper lung cancer superimposed on a history of earlier treated left lung cancer.  His treated RUL lesion is similar in size to the pre-SBRT PET, suggesting stable plus or minus fibrosis.  Impression/Plan:  In light of this information, the patient will undergo left pleurocentesis to improve breathing and check cytology.  It appears loculated, but hopefully a substantial volume of fluid will be accessible, and he may need pleurX drain.  Agree with diagnostic/therapeutic drainage of left pleural effusion and medical oncology consultation if confirmed to be malignant.   ________________________________  Artist Pais. Kathrynn Running, M.D.

## 2023-09-03 NOTE — Plan of Care (Signed)
  Problem: Education: Goal: Knowledge of General Education information will improve Description: Including pain rating scale, medication(s)/side effects and non-pharmacologic comfort measures Outcome: Progressing   Problem: Health Behavior/Discharge Planning: Goal: Ability to manage health-related needs will improve Outcome: Progressing   Problem: Clinical Measurements: Goal: Ability to maintain clinical measurements within normal limits will improve Outcome: Progressing Goal: Diagnostic test results will improve Outcome: Progressing   Problem: Nutrition: Goal: Adequate nutrition will be maintained Outcome: Progressing

## 2023-09-03 NOTE — Progress Notes (Signed)
[facility] Progress Note Patient: [name] [name] XBJ:[id number] DOB: May 19, 1951 DOA: 09/02/2023  DOS: the patient was seen and examined on 09/03/2023  Brief hospital course: PMH of stage IV non-small cell lung cancer as well as squamous cell lung cancer recently undergoing SBRT and currently on observation for chemotherapy, active smoker, PAD, COPD, depression, GERD, CVA, duodenal ulcer with perforation, HTN, inguinal hernia. Present to the hospital with complaints of shortness of breath.  Found to have large left pleural effusion. Underwent thoracentesis.  Assessment and Plan: Large left pleural effusion. There was initial concern for loculation on CT scan. No fever no chills reported by the patient. Underwent thoracentesis with 2 L fluid removed. Per her report there is still some fluid left and patient would benefit from repeat thoracentesis sometime next week. Most likely malignant in nature and thus may require a Pleurx catheter placement depending on the timing.  Active smoker. Wants to go outside to smoke. Recommended that the hospital policy would not allow him to smoke within the hospital premises. Offered nicotine patch and nicotine gum.  Postoperative pneumonia. CT scan shows evidence of postoperative pneumonia as well potentially currently receiving IV antibiotic. Follow-up on pleural fluid cultures.  Metastatic squamous cell as well as non-small cell carcinoma. Had chemotherapy in the past. Then was on immunotherapy and developed peripheral neuropathy. Recently completed SBRT. No further imaging since July. CT scan appears to be progressively worsening. Patient is not interested in chemotherapy. Prognosis is poor.  Mood disorder. Patient is on multiple psychotropic medication including amitriptyline, gabapentin, Remeron, restoral. Will continue all of them. Patient is also supposed to be on Pamelor although I do not understand the significance of both Pamelor as  well as amitriptyline at the same time therefore we will discontinue it.  GERD. So far no bleeding. No abdominal pain. Prior history. Continue PPI twice daily.    Subjective: No nausea no vomiting.  Continues to have shortness of breath.  Wants to leave the hospital AMA and tried multiple times.  Physical Exam: General: in moderate distress, No Rash Cardiovascular: S1 and S2 Present, No Murmur Respiratory: Good respiratory effort, Bilateral Air entry present.  Basal crackles, No wheezes Abdomen: Bowel Sound present, No tenderness Extremities: No edema Neuro: Alert and oriented x3, no new focal deficit  Data Reviewed: I have Reviewed nursing notes, Vitals, and Lab results. Since last encounter, pertinent lab results CBC and BMP   . I have ordered test including CBC and BMP  . I have discussed pt's care plan and test results with medical oncology, radiation oncology, interventional radiology  .   Disposition: Status is: Inpatient Remains inpatient appropriate because: Need for further workup and oxygen arrangement  enoxaparin (LOVENOX) injection 40 mg Start: 09/03/23 0800 SCDs Start: 09/02/23 2034   Family Communication: No one at bedside Level of care: Med-Surg   Vitals:   09/03/23 0947 09/03/23 1150 09/03/23 1210 09/03/23 1350  BP: (!) 148/88 (!) 140/92 (!) 137/91 (!) 148/91  Pulse: (!) 101   88  Resp: 20   18  Temp: (!) 97.4 F (36.3 C)   97.6 F (36.4 C)  TempSrc: Oral     SpO2: 90%   92%  Weight:      Height:         Author: Lynden Oxford, MD 09/03/2023 6:44 PM  Please look on [email or web address] to find out who is on call.

## 2023-09-03 NOTE — Progress Notes (Signed)
SATURATION QUALIFICATIONS: (This note is used to comply with regulatory documentation for home oxygen)  Patient Saturations on Room Air at Rest = 70%  Patient Saturations on Room Air while Ambulating = 84%  Patient Saturations on 4 Liters of oxygen while Ambulating = 74%  Please briefly explain why patient needs home oxygen: Pt not adequately meeting oxygen requirements on room air.

## 2023-09-03 NOTE — Procedures (Addendum)
PROCEDURE SUMMARY:  Successful image-guided left thoracentesis. Yielded 2.0 liters of clear, light amber fluid which is the maximum to be removed per IR protocol. There is significant residual fluid on post procedure ultrasound. Patient would likely benefit from repeat thoracentesis early next week which can be performed as an outpatient if he is otherwise stable for discharge. Patient tolerated procedure well. EBL < 1 mL No immediate complications.  Specimen was sent for labs. Post procedure CXR shows no pneumothorax.  Note: CT chest w/o contrast yesterday showed large loculated left pleural effusion. Ultrasound examination of the left chest from scapula to T12 shows no significant loculations.  Please see imaging section of Epic for full dictation.  Villa Herb PA-C 09/03/2023 12:16 PM

## 2023-09-04 DIAGNOSIS — J90 Pleural effusion, not elsewhere classified: Secondary | ICD-10-CM | POA: Diagnosis not present

## 2023-09-04 MED ORDER — NICOTINE 21 MG/24HR TD PT24: 21.0000 mg | MEDICATED_PATCH | Freq: Every day | TRANSDERMAL | 0 refills | Status: DC

## 2023-09-04 MED ORDER — AMOXICILLIN-POT CLAVULANATE 875-125 MG PO TABS
1.0000 | ORAL_TABLET | Freq: Two times a day (BID) | ORAL | Status: DC
  Administered 2023-09-04 – 2023-09-05 (×3): 1 via ORAL
  Filled 2023-09-04 (×3): qty 1

## 2023-09-04 MED ORDER — ENSURE ENLIVE PO LIQD: 237.0000 mL | Freq: Three times a day (TID) | ORAL | 0 refills | Status: DC

## 2023-09-04 MED ORDER — AMOXICILLIN-POT CLAVULANATE 875-125 MG PO TABS: 1.0000 | ORAL_TABLET | Freq: Two times a day (BID) | ORAL | 0 refills | Status: AC

## 2023-09-04 MED ORDER — NICOTINE POLACRILEX 4 MG MT GUM: 4.0000 mg | CHEWING_GUM | OROMUCOSAL | 0 refills | Status: DC | PRN

## 2023-09-04 MED ORDER — POLYETHYLENE GLYCOL 3350 17 G PO PACK: 17.0000 g | PACK | Freq: Every day | ORAL | 0 refills | Status: DC | PRN

## 2023-09-04 NOTE — TOC Initial Note (Addendum)
Transition of Care [facility]) - Initial/Assessment Note   Patient Details  Name: [name] [name] MRN: [id number] Date of Birth: 03-21-1951  Transition of Care [facility]) CM/SW Contact:    [name] Schlein, LCSW Phone Number: 09/04/2023, 10:37 AM  Clinical Narrative: [name] consulted for home oxygen. CSW called [name] [name] with [employer] to make referral. Per [name] [name], [facility] cannot provide home oxygen as the patient's [facility] HMO is only in-network with [employer] in [place]. CSW reached out to [name] [name] with [employer]. [name] [name] reported he cannot provide home oxygen due to patient's insurance. CSW called [name] Batten with [employer] and [name] Batten informed CSW [employer] cannot provide home oxygen in [place]. CSW attempted to call a [employer], but all the [place] offices are closed for the weekend. CSW attempted to call patient's insurance for assistance, but reached a message to call back during regular business hours. CSW updated hospitalist and patient regarding the delay in getting home oxygen set up.  Expected Discharge Plan: Home/Self Care Barriers to Discharge: Other (must enter comment) (No DME company will accept referral for home oxygen.)  Patient Goals and CMS Choice Patient states their goals for this hospitalization and ongoing recovery are:: Get home oxygen CMS Medicare.gov Compare Post Acute Care list provided to:: Patient Choice offered to / list presented to : Patient  Expected Discharge Plan and Services In-house Referral: Clinical Social Work Post Acute Care Choice: Durable Medical Equipment Living arrangements for the past 2 months: Single Family Home Expected Discharge Date: 09/04/23               DME Arranged: Oxygen  Prior Living Arrangements/Services Living arrangements for the past 2 months: Single Family Home Patient language and need for interpreter reviewed:: Yes Do you feel safe going back to the place where you live?: Yes      Need for Family Participation in Patient Care: No (Comment) Care giver  support system in place?: Yes (comment) Criminal Activity/Legal Involvement Pertinent to Current Situation/Hospitalization: No - Comment as needed  Activities of Daily Living ADL Screening (condition at time of admission) Independently performs ADLs?: Yes (appropriate for developmental age) Is the patient deaf or have difficulty hearing?: No Does the patient have difficulty seeing, even when wearing glasses/contacts?: No Does the patient have difficulty concentrating, remembering, or making decisions?: No  Permission Sought/Granted Permission sought to share information with : Other (comment) Permission granted to share information with : Yes, Verbal Permission Granted Permission granted to share info w AGENCY: DME companies  Emotional Assessment Attitude/Demeanor/Rapport: Engaged Affect (typically observed): Accepting Orientation: : Oriented to Self, Oriented to Place, Oriented to  Time, Oriented to Situation Alcohol / Substance Use: Not Applicable Psych Involvement: No (comment)  Admission diagnosis:  Pleural effusion [J90] Patient Active Problem List   Diagnosis Date Noted   Pleural effusion 09/02/2023   Primary cancer of right upper lobe of lung (HCC) 05/24/2023   Hoarseness 08/04/2020   AKI (acute kidney injury) (HCC) 05/07/2019   Hyperkalemia 05/07/2019   Diarrhea 05/07/2019   Sepsis (HCC) 05/07/2019   Chemotherapy-induced neuropathy (HCC) 01/05/2019   Encounter for antineoplastic immunotherapy 12/12/2017   Peripheral neuropathy 07/05/2017   Hypertension 05/24/2017   Insomnia 05/24/2017   Perforated duodenal ulcer (HCC) s/p exploratory laparotomy and placement of Graham patch 02/25/17 02/25/2017   H/O exploratory laparotomy 02/25/2017   Malignant neoplasm metastatic to brain (HCC) 02/15/2017   Encounter for antineoplastic chemotherapy 01/29/2017   Goals of care, counseling/discussion 01/29/2017   Depression 01/29/2017   Encounter for smoking cessation counseling  01/29/2017  Metastatic squamous cell carcinoma to lung, unspecified laterality (HCC) 01/28/2017   Pulmonary nodules    PCP:  Gemma Payor, MD Pharmacy:   [facility] [address], [place] - [address] [place] [address] Phone: [phone] Fax: [phone]  Social Determinants of Health (SDOH) Social History: SDOH Screenings   Food Insecurity: No Food Insecurity (09/02/2023)  Housing: Low Risk  (09/02/2023)  Transportation Needs: No Transportation Needs (09/02/2023)  Utilities: Not At Risk (09/02/2023)  Alcohol Screen: Low Risk  (05/24/2023)  Depression (PHQ2-9): Low Risk  (05/24/2023)  Tobacco Use: High Risk (09/02/2023)   SDOH Interventions:    Readmission Risk Interventions     No data to display

## 2023-09-04 NOTE — Plan of Care (Signed)

## 2023-09-04 NOTE — Progress Notes (Addendum)
Patient Saturations on Room Air at Rest = 89%  Patient Saturations on [employer] while Ambulating = 87%  Patient Saturations on 4 Liters of oxygen while Ambulating = 93%  Please briefly explain why patient needs home oxygen:

## 2023-09-04 NOTE — Progress Notes (Signed)
[facility] Progress Note Patient: [name] [name] ZOX:[id number] DOB: 1951/07/13 DOA: 09/02/2023  DOS: the patient was seen and examined on 09/04/2023  Brief hospital course: PMH of stage IV non-small cell lung cancer as well as squamous cell lung cancer recently undergoing SBRT and currently on observation for chemotherapy, active smoker, PAD, COPD, depression, GERD, CVA, duodenal ulcer with perforation, HTN, inguinal hernia. Present to the hospital with complaints of shortness of breath.  Found to have large left pleural effusion. Underwent thoracentesis.  Assessment and Plan: Large left pleural effusion. Acute hypoxic respiratory failure. There was initial concern for loculation on CT scan. No fever no chills reported by the patient. Underwent thoracentesis with 2 L fluid removed. Per her report there is still some fluid left and patient would benefit from repeat thoracentesis sometime next week. Most likely malignant in nature and thus may require a Pleurx catheter placement depending on the timing. O2 saturation actually dropped down to 84% without any oxygen but while working with ambulation dropped down to 87%. Currently on 4 LPM. Unable to arrange oxygen due to his insurance as well as location issues for now.  Will monitor.  Consider repeat thoracentesis if possible on Monday.  Active smoker. Wants to go outside to smoke. Recommended that the hospital policy would not allow him to smoke within the hospital premises. Offered nicotine patch and nicotine gum.  Postoperative pneumonia. CT scan shows evidence of postoperative pneumonia as well potentially currently receiving IV antibiotic. Follow-up on pleural fluid cultures.  Metastatic squamous cell as well as non-small cell carcinoma. Had chemotherapy in the past. Then was on immunotherapy and developed peripheral neuropathy. Recently completed SBRT. No further imaging since July. CT scan appears to be progressively  worsening. Patient is not interested in chemotherapy. Prognosis is poor.  Mood disorder. Patient is on multiple psychotropic medication including amitriptyline, gabapentin, Remeron, restoral. Will continue all of them. Patient is also supposed to be on Pamelor although I do not understand the significance of both Pamelor as well as amitriptyline at the same time therefore we will discontinue it.  GERD. So far no bleeding. No abdominal pain. Prior history. Continue PPI twice daily.    Subjective: No acute complaint.  Ongoing cough.  Ongoing shortness of breath.  Physical Exam: Bilateral basal crackles. S1-S2 present. Bowel sound present. No edema  Data Reviewed: I have Reviewed nursing notes, Vitals, and Lab results. Reviewed CBC and CMP. Reordered CBC and CMP. Order chest x-ray as well as thoracentesis.  Disposition: Status is: Inpatient Remains inpatient appropriate because: Need for further workup and oxygen arrangement  enoxaparin (LOVENOX) injection 40 mg Start: 09/03/23 0800 SCDs Start: 09/02/23 2034   Family Communication: No one at bedside Level of care: Med-Surg   Vitals:   09/03/23 1350 09/03/23 2151 09/04/23 0612 09/04/23 1318  BP: (!) 148/91 125/79 129/85 114/69  Pulse: 88 88 89 93  Resp: 18 14 14 18   Temp: 97.6 F (36.4 C) 98.1 F (36.7 C) 98.1 F (36.7 C) (!) 97.5 F (36.4 C)  TempSrc:  Oral Oral Oral  SpO2: 92% 95% 97% 99%  Weight:      Height:         Author: Lynden Oxford, MD 09/04/2023 6:52 PM  Please look on [email or web address] to find out who is on call.

## 2023-09-04 NOTE — Progress Notes (Signed)
SATURATION QUALIFICATIONS: (This note is used to comply with regulatory documentation for home oxygen)  Patient Saturations on Room Air at Rest = 89%  Patient Saturations on Room Air while Ambulating = 88%  Patient Saturations on 4 Liters of oxygen while Ambulating = 93%  Please briefly explain why patient needs home oxygen:

## 2023-09-05 ENCOUNTER — Telehealth: Payer: Self-pay | Admitting: Medical Oncology

## 2023-09-05 ENCOUNTER — Inpatient Hospital Stay (HOSPITAL_COMMUNITY): Payer: Medicare HMO

## 2023-09-05 DIAGNOSIS — J90 Pleural effusion, not elsewhere classified: Secondary | ICD-10-CM | POA: Diagnosis not present

## 2023-09-05 LAB — BASIC METABOLIC PANEL
Anion gap: 7 (ref 5–15)
BUN: 11 mg/dL (ref 8–23)
CO2: 35 mmol/L — ABNORMAL HIGH (ref 22–32)
Calcium: 8.8 mg/dL — ABNORMAL LOW (ref 8.9–10.3)
Chloride: 95 mmol/L — ABNORMAL LOW (ref 98–111)
Creatinine, Ser: 0.53 mg/dL — ABNORMAL LOW (ref 0.61–1.24)
GFR, Estimated: >60 mL/min (ref >60)
Glucose, Bld: 100 mg/dL — ABNORMAL HIGH (ref 70–99)
Potassium: 4 mmol/L (ref 3.5–5.1)
Sodium: 137 mmol/L (ref 135–145)

## 2023-09-05 LAB — BODY FLUID CELL COUNT WITH DIFFERENTIAL
Lymphs, Fluid: 94 %
Neutrophil Count, Fluid: 6 % (ref 0–25)
Total Nucleated Cell Count, Fluid: 941 uL (ref 0–1000)

## 2023-09-05 LAB — CBC
HCT: 43.9 % (ref 39.0–52.0)
Hemoglobin: 13.3 g/dL (ref 13.0–17.0)
MCH: 27.5 pg (ref 26.0–34.0)
MCHC: 30.3 g/dL (ref 30.0–36.0)
MCV: 90.7 fL (ref 80.0–100.0)
Platelets: 274 10*3/uL (ref 150–400)
RBC: 4.84 MIL/uL (ref 4.22–5.81)
RDW: 15.4 % (ref 11.5–15.5)
WBC: 11.1 10*3/uL — ABNORMAL HIGH (ref 4.0–10.5)
nRBC: 0 % (ref 0.0–0.2)

## 2023-09-05 MED ORDER — LIDOCAINE HCL 1 % IJ SOLN
INTRAMUSCULAR | Status: AC
  Filled 2023-09-05: qty 20

## 2023-09-05 NOTE — Progress Notes (Signed)
Pt stated he was going to smoke once he left the hospital - Pt verbalized an understanding that he can not smoke while on O2 - Portable CXR needs to be done prior to pt leaving. Pt also requested nicotine patch be removed because he was going to smoke. Nicotine patch removed by this RN.

## 2023-09-05 NOTE — TOC Transition Note (Signed)
Transition of Care [facility]) - CM/SW Discharge Note   Patient Details  Name: [name] [name] MRN: [id number] Date of Birth: July 08, 1951  [facility]) CM/SW Contact:  Amada Jupiter, LCSW Phone Number: 09/05/2023, 10:13 AM   Clinical Narrative:     Able to clarify/ confirm that [facility] IS the provider for pt's home area ([place], [place]).  Oxygen order placed with [facility] and local branch will deliver portable to room for dc with home concentrator to home later today.  Pt aware and no further TOC needs.  Final next level of care: Home/Self Care Barriers to Discharge: Barriers Resolved   Patient Goals and CMS Choice CMS Medicare.gov Compare Post Acute Care list provided to:: Patient Choice offered to / list presented to : Patient  Discharge Placement                         Discharge Plan and Services Additional resources added to the After Visit Summary for   In-house Referral: Clinical Social Work   Post Acute Care Choice: Durable Medical Equipment          DME Arranged: Oxygen DME Agency: [facility] Date DME Agency Contacted: 09/05/23 Time DME Agency Contacted: 1012 Representative spoke with at DME Agency: Marthann Schiller            Social Determinants of Health (SDOH) Interventions SDOH Screenings   Food Insecurity: No Food Insecurity (09/02/2023)  Housing: Low Risk  (09/02/2023)  Transportation Needs: No Transportation Needs (09/02/2023)  Utilities: Not At Risk (09/02/2023)  Alcohol Screen: Low Risk  (05/24/2023)  Depression (PHQ2-9): Low Risk  (05/24/2023)  Tobacco Use: High Risk (09/02/2023)     Readmission Risk Interventions    09/05/2023   10:12 AM  Readmission Risk Prevention Plan  Transportation Screening Complete  PCP or Specialist Appt within 5-7 Days Complete  Home Care Screening Complete  Medication Review (RN CM) Complete

## 2023-09-05 NOTE — Telephone Encounter (Addendum)
Pt next appt is 12/9. I told him [facility] wants to see him .  [facility]  will call back to confirm the appt.  CT done 09/02/2023.

## 2023-09-05 NOTE — Plan of Care (Signed)
?  Problem: Clinical Measurements: ?Goal: Ability to maintain clinical measurements within normal limits will improve ?Outcome: Progressing ?Goal: Will remain free from infection ?Outcome: Progressing ?Goal: Diagnostic test results will improve ?Outcome: Progressing ?  ?

## 2023-09-05 NOTE — Procedures (Signed)
Ultrasound-guided diagnostic and therapeutic left thoracentesis performed yielding 1.3 liters of blood-tinged fluid. No immediate complications. Follow-up chest x-ray pending.The fluid was sent to the lab for preordered studies. EBL< 2 cc.

## 2023-09-05 NOTE — Progress Notes (Signed)
Pt states" I am leaving. I was told I could go home 2 hours ago.I want to go outside and smoke." RN explained the risks of leaving w/o the oxygen. RN also explained that pt could not go outside to smoke and then come back to the floor to get O2 tank. This RN explained that was not possible. If he left then he was signing out AMA. Patient agreed to wait for O2. PIV removed as documented.

## 2023-09-05 NOTE — Telephone Encounter (Signed)
Pt given appt for 11/22 to see [facility] re plan of care. Pt declined the appt.   I offered an appt for 11/25 at 1030. [name] [name] said he has to look at  his calendar and will call me back .

## 2023-09-05 NOTE — Telephone Encounter (Signed)
Pt admitted to [facility] for dyspnea on 11/15 with Pleural effusion >2 L fluid removed.   Please arrange for him a personal follow-up visit for discussion of his condition and further treatment options.  Virtual visit is not a good option at this time.  Thank you

## 2023-09-06 ENCOUNTER — Telehealth: Payer: Self-pay | Admitting: Physician Assistant

## 2023-09-06 LAB — BODY FLUID CULTURE W GRAM STAIN: Culture: NO GROWTH

## 2023-09-06 NOTE — Telephone Encounter (Signed)
Unable to leave a message on patient's phone due to voicemail box being full, also unable to leave a voicemail on patient contact phone due to number being out of service

## 2023-09-06 NOTE — Discharge Summary (Signed)
Physician Discharge Summary   Patient: [name] [name] MRN: [id number] DOB: [birth year]  Admit date:     09/02/2023  Discharge date: 09/05/2023  Discharge Physician: Lynden Oxford  PCP: Gemma Payor, MD  Recommendations at discharge:  Follow up with Dr Arbutus Ped and PCP. At risk of readmission and poor outcome due to his persistent non compliant behaviors with oxygen therapy.    Follow-up Information     Laws, [name], MD. Schedule an appointment as soon as possible for a visit in 1 week(s).   Specialty: Internal Medicine Why: Repeat Chest X ray Contact information: [facility] [address] Laurell Josephs [address] [address] [phone]         Si Gaul, MD. Call on 09/05/2023.   Specialty: Oncology Why: to arrange follow up and discuss cancer treatment options. Contact information: [address] [place] [address] [phone]                Discharge Diagnoses: Principal Problem:   Pleural effusion Active Problems:   Metastatic squamous cell carcinoma to lung, unspecified laterality [facility])  Brief hospital course: PMH of stage IV non-small cell lung cancer as well as squamous cell lung cancer recently undergoing SBRT and currently on observation for chemotherapy, active smoker, PAD, COPD, depression, GERD, CVA, duodenal ulcer with perforation, HTN, inguinal hernia. Present to the hospital with complaints of shortness of breath.  Found to have large left pleural effusion. Underwent thoracentesis x2.   Assessment and Plan: Large left pleural effusion. Acute hypoxic respiratory failure. There was initial concern for loculation on CT scan. No fever no chills reported by the patient. Underwent thoracentesis with 2 L fluid removed. Most likely malignant in nature and thus may require a Pleurx catheter placement depending on the timing. O2 saturation actually dropped down to 84% without any oxygen but while working with ambulation dropped down to 87%. Underwent repeat  thoracentesis with 1.3 liters removed.  Pt has been seen non complaint with oxygen therapy multiple times in the room despite repeated instruction about the importance of it.  Currently on 4 LPM.  Active smoker. Wants to go outside to smoke. Recommended that the hospital policy would not allow him to smoke within the hospital premises. Offered nicotine patch and nicotine gum.   Postoperative pneumonia. CT scan shows evidence of postoperative pneumonia as well potentially currently receiving IV antibiotic. Follow-up on pleural fluid cultures.   Metastatic squamous cell as well as non-small cell carcinoma. Had chemotherapy in the past. Then was on immunotherapy and developed peripheral neuropathy. Recently completed SBRT. No further imaging since July. CT scan appears to be progressively worsening. Patient is not interested in chemotherapy. Non compliant with follow ups.  Prognosis is poor.   Mood disorder. Patient is on multiple psychotropic medication including amitriptyline, gabapentin, Remeron, restoral. Will continue all of them. Patient is also supposed to be on Pamelor although I do not understand the significance of both Pamelor as well as amitriptyline at the same time therefore we will discontinue it.   GERD. So far no bleeding. No abdominal pain. Prior history. Continue PPI twice daily.  Consultants:  IR  Procedures performed:  US thoracentesis x 2  DISCHARGE MEDICATION: Allergies as of 09/05/2023   No Known Allergies      Medication List     STOP taking these medications    nortriptyline 10 MG capsule Commonly known as: PAMELOR       TAKE these medications    acetaminophen 325 MG tablet Commonly known  as: TYLENOL You can take 2 every 4 hours as needed.  You can buy it over the counter DO NOT TAKE MORE THAN 4000 MG OF TYLENOL PER DAY.  IT CAN HARM YOUR LIVER.   albuterol 108 (90 Base) MCG/ACT inhaler Commonly known as: VENTOLIN HFA Inhale  into the lungs every 6 (six) hours as needed for wheezing or shortness of breath.   amitriptyline 100 MG tablet Commonly known as: ELAVIL Take 1 tablet (100 mg total) by mouth at bedtime.   amoxicillin-clavulanate 875-125 MG tablet Commonly known as: AUGMENTIN Take 1 tablet by mouth 2 (two) times daily for 7 days.   aspirin EC 81 MG tablet Take 81 mg by mouth daily.   benzonatate 100 MG capsule Commonly known as: TESSALON Take 100 mg by mouth 3 (three) times daily as needed for cough.   feeding supplement Liqd Take 237 mLs by mouth 3 (three) times daily between meals.   finasteride 5 MG tablet Commonly known as: PROSCAR Take 5 mg by mouth daily.   gabapentin 300 MG capsule Commonly known as: NEURONTIN TAKE 2 CAPSULES BY MOUTH THREE TIMES DAILY What changed: when to take this   lisinopril 2.5 MG tablet Commonly known as: ZESTRIL Take 2.5 mg by mouth daily.   loperamide 2 MG capsule Commonly known as: IMODIUM Take 1 capsule (2 mg total) by mouth 2 (two) times daily. Also available over the counter. You can take 1 extra dose in the afternoon if diarrhea is not improving.   metoprolol succinate 25 MG 24 hr tablet Commonly known as: TOPROL-XL Take 1 tablet (25 mg total) by mouth daily.   mirtazapine 30 MG tablet Commonly known as: REMERON TAKE 1 TABLET BY MOUTH AT BEDTIME   nicotine 21 mg/24hr patch Commonly known as: NICODERM CQ - dosed in mg/24 hours Place 1 patch (21 mg total) onto the skin daily.   nicotine polacrilex 4 MG gum Commonly known as: NICORETTE Take 1 each (4 mg total) by mouth as needed for smoking cessation.   OMEGA-3 FISH OIL PO Take 1 capsule by mouth daily.   omeprazole 20 MG capsule Commonly known as: PRILOSEC Take 20 mg by mouth 2 (two) times daily before a meal.   polyethylene glycol 17 g packet Commonly known as: MIRALAX / GLYCOLAX Take 17 g by mouth daily as needed for mild constipation.   simvastatin 20 MG tablet Commonly known as:  ZOCOR Take 20 mg by mouth daily.   tamsulosin 0.4 MG Caps capsule Commonly known as: FLOMAX Take 0.4 mg by mouth daily.   temazepam 30 MG capsule Commonly known as: RESTORIL Take 1 capsule (30 mg total) by mouth at bedtime.       Disposition: Home Diet recommendation: Regular diet  Discharge Exam: Vitals:   09/05/23 0542 09/05/23 1100 09/05/23 1127 09/05/23 1137  BP: 125/85 131/84 123/71 122/84  Pulse: 85     Resp: 14     Temp: 97.7 F (36.5 C)     TempSrc: Oral     SpO2: 96%     Weight:      Height:       General: Appear in mild distress; no visible Abnormal Neck Mass Or lumps, Conjunctiva normal Cardiovascular: S1 and S2 Present, no Murmur, Respiratory: good respiratory effort, Bilateral Air entry present and basal Crackles, expiratory wheezes Abdomen: Bowel Sound present, Non tender  Extremities: no Pedal edema Neurology: alert and oriented to time, place, and person  [facility] Weights   09/02/23 1701  Weight:  56.7 kg   Condition at discharge: stable  The results of significant diagnostics from this hospitalization (including imaging, microbiology, ancillary and laboratory) are listed below for reference.   Imaging Studies: US THORACENTESIS ASP PLEURAL SPACE W/IMG GUIDE  Result Date: 09/05/2023 INDICATION: Patient with history of stage IV squamous cell carcinoma of lung, recurrent left pleural effusion. Request received for diagnostic and therapeutic left thoracentesis. EXAM: ULTRASOUND GUIDED DIAGNOSTIC AND THERAPEUTIC LEFT THORACENTESIS MEDICATIONS: 8 ml 1% lidocaine COMPLICATIONS: None immediate. PROCEDURE: An ultrasound guided thoracentesis was thoroughly discussed with the patient and questions answered. The benefits, risks, alternatives and complications were also discussed. The patient understands and wishes to proceed with the procedure. Written consent was obtained. Ultrasound was performed to localize and mark an adequate pocket of fluid in the left chest. The  area was then prepped and draped in the normal sterile fashion. 1% Lidocaine was used for local anesthesia. Under ultrasound guidance a 6 Fr Safe-T-Centesis catheter was introduced. Thoracentesis was performed. The catheter was removed and a dressing applied. FINDINGS: A total of approximately 1.3 liters of blood-tinged fluid was removed. Samples were sent to the laboratory as requested by the clinical team. Due to chest discomfort only the above amount of fluid was removed today. IMPRESSION: Successful ultrasound guided diagnostic and therapeutic LEFT thoracentesis yielding 1.3 L of pleural fluid. Performed by: Artemio Aly Electronically Signed   By: Roanna Banning M.D.   On: 09/05/2023 14:10   DG Chest 1 View  Result Date: 09/05/2023 CLINICAL DATA:  Status post thoracentesis. EXAM: CHEST  1 VIEW COMPARISON:  09/03/2023. FINDINGS: Redemonstration of small-to-moderate left pleural effusion with probable associated compressive atelectatic changes in the left lung. There is interval decrease in the pleural effusion when compared to the prior exam, secondary to thoracentesis. No pneumothorax seen. Bilateral lung fields are otherwise clear. Right lateral costophrenic angle is clear. Evaluation of cardiomediastinal silhouette is limited due to left lower hemithorax opacification. However, no significant interval change in the interim. No acute osseous abnormalities. The soft tissues are within normal limits. IMPRESSION: *Small-to-moderate left pleural effusion, decreased since the prior study, status post left-sided thoracentesis. No pneumothorax. Electronically Signed   By: Jules Schick M.D.   On: 09/05/2023 13:55   US THORACENTESIS ASP PLEURAL SPACE W/IMG GUIDE  Result Date: 09/03/2023 INDICATION: Patient with history of stage IV non-small cell lung cancer s/p chemotherapy and radiation admitted with worsening dyspnea, found to have new large left pleural effusion. Request for diagnostic and therapeutic  left thoracentesis. EXAM: ULTRASOUND GUIDED LEFT THORACENTESIS MEDICATIONS: 7 mL 1% lidocaine. COMPLICATIONS: None immediate. PROCEDURE: An ultrasound guided thoracentesis was thoroughly discussed with the patient and questions answered. The benefits, risks, alternatives and complications were also discussed. The patient understands and wishes to proceed with the procedure. Written consent was obtained. Ultrasound was performed to localize and mark an adequate pocket of fluid in the left chest. The area was then prepped and draped in the normal sterile fashion. 1% Lidocaine was used for local anesthesia. Under ultrasound guidance a 6 Fr Safe-T-Centesis catheter was introduced. Thoracentesis was performed. The catheter was removed and a dressing applied. FINDINGS: A total of approximately 2.0 L of clear, light amber fluid was removed. Samples were sent to the laboratory as requested by the clinical team. Significant residual fluid seen on post procedure ultrasound. IMPRESSION: Successful ultrasound guided left thoracentesis yielding 2.0 L of pleural fluid. Performed by Lynnette Caffey, PA-C Electronically Signed   By: Richarda Overlie M.D.   On: 09/03/2023 15:43  DG Chest Port 1 View  Result Date: 09/03/2023 CLINICAL DATA:  Left thoracentesis EXAM: PORTABLE CHEST 1 VIEW COMPARISON:  09/02/2023 FINDINGS: Moderate sized left-sided pleural effusion has decreased following thoracentesis. Left pleural thickening and nodularity. Fiduciary markers are present bilaterally. Stable heart size. Aortic atherosclerosis. Right lung is clear. No pneumothorax. IMPRESSION: Moderate sized left-sided pleural effusion has decreased following thoracentesis. No pneumothorax. Electronically Signed   By: Duanne Guess D.O.   On: 09/03/2023 13:00   CT CHEST WO CONTRAST  Result Date: 09/02/2023 CLINICAL DATA:  Lung cancer, increasing shortness of breath, pleural effusion EXAM: CT CHEST WITHOUT CONTRAST TECHNIQUE: Multidetector CT  imaging of the chest was performed following the standard protocol without IV contrast. RADIATION DOSE REDUCTION: This exam was performed according to the departmental dose-optimization program which includes automated exposure control, adjustment of the mA and/or kV according to patient size and/or use of iterative reconstruction technique. COMPARISON:  09/02/2023, 05/12/2023, 04/06/2023 FINDINGS: Cardiovascular: Unenhanced imaging of the heart is unremarkable without pericardial effusion. 4.3 cm ascending thoracic aortic aneurysm is unchanged. Evaluation of the vascular lumen is limited without IV contrast. Stable atherosclerosis of the aorta and coronary vasculature. Mediastinum/Nodes: No enlarged mediastinal or axillary lymph nodes. Thyroid gland, trachea, and esophagus demonstrate no significant findings. Lungs/Pleura: There is a large loculated left basilar pleural effusion, volume estimated greater than 2 L. significant compressive atelectasis at the left lung base. Circumferential irregular left pleural thickening concerning for pleural based metastases in this patient with a known history of lung cancer. Progressive bandlike area of consolidation within the left upper lobe corresponding to the metabolically active region on recent PET scan, now measuring up to 2.2 cm and consistent with progressive malignancy. 2.2 x 1.4 cm spiculated right apical mass consistent with second primary malignancy, reference image 26/4, which has progressed since prior study. Adjacent 0.9 cm spiculated nodule right upper lobe image 38/4 is unchanged. No evidence of pneumothorax.  Central airways are patent. Upper Abdomen: Stable bilateral low-attenuation adrenal thickening. No acute upper abdominal findings. Musculoskeletal: The chronic pathologic fracture within the left lateral fourth and fifth ribs unchanged since prior exam. No new acute or destructive bony abnormalities. Reconstructed images demonstrate no additional  findings. IMPRESSION: 1. Large loculated left basilar pleural effusion, with circumferential nodular left pleural thickening, consistent with malignant effusion and pleural based metastases. 2. Progressive spiculated right apical mass and band like consolidation within the left upper lobe, consistent with progressive lung cancer. 3. Stable spiculated subcentimeter right upper lobe pulmonary nodule, continued follow-up recommended. 4. Stable 4.3 cm ascending thoracic aortic aneurysm. Evaluation of the vascular lumen is limited without IV contrast. Recommend annual imaging followup by CTA or MRA. This recommendation follows 2010 ACCF/AHA/AATS/ACR/ASA/SCA/SCAI/SIR/STS/SVM Guidelines for the Diagnosis and Management of Patients with Thoracic Aortic Disease. Circulation. 2010; 121: Q469-G295. Aortic aneurysm NOS (ICD10-I71.9) 5. Aortic Atherosclerosis (ICD10-I70.0). Coronary artery atherosclerosis. Electronically Signed   By: Sharlet Salina M.D.   On: 09/02/2023 21:42   DG Chest Port 1 View  Result Date: 09/02/2023 CLINICAL DATA:  SOB. EXAM: PORTABLE CHEST 1 VIEW COMPARISON:  05/07/2019. FINDINGS: Large left-sided pleural effusion. Cardiac silhouette is obscured. Right lung is clear. Aorta is ectatic and calcified. No pneumothorax. Normal pulmonary vasculature. Thoracic degenerative changes. IMPRESSION: Large left-sided pleural effusion. Electronically Signed   By: Layla Maw M.D.   On: 09/02/2023 19:22    Microbiology: Results for orders placed or performed during the hospital encounter of 09/02/23  Culture, blood (Routine X 2) w Reflex to ID Panel  Status: None (Preliminary result)   Collection Time: 09/02/23  9:44 PM   Specimen: BLOOD RIGHT ARM  Result Value Ref Range Status   Specimen Description BLOOD RIGHT ARM  Final   Special Requests   Final    BOTTLES DRAWN AEROBIC AND ANAEROBIC Blood Culture adequate volume   Culture   Final    NO GROWTH 3 DAYS Performed at [facility],  [address] [place], [place] [address]    Report Status PENDING  Incomplete  Culture, blood (Routine X 2) w Reflex to ID Panel     Status: None (Preliminary result)   Collection Time: 09/02/23  9:44 PM   Specimen: BLOOD LEFT ARM  Result Value Ref Range Status   Specimen Description BLOOD LEFT ARM  Final   Special Requests   Final    BOTTLES DRAWN AEROBIC AND ANAEROBIC Blood Culture adequate volume   Culture   Final    NO GROWTH 3 DAYS Performed at [facility], [address] [place], [place] [address]    Report Status PENDING  Incomplete  Body fluid culture w Gram Stain     Status: None (Preliminary result)   Collection Time: 09/03/23 12:35 PM   Specimen: PATH Cytology Pleural fluid  Result Value Ref Range Status   Specimen Description   Final    PLEURAL Performed at [facility], [address] [place], [place] [address]    Special Requests   Final    NONE Performed at [facility], [address], [place], [place] [address]    Gram Stain WBC SEEN NO ORGANISMS SEEN CYTOSPIN SMEAR   Final   Culture   Final    NO GROWTH 2 DAYS Performed at [facility], [address] [place], [place] [address]    Report Status PENDING  Incomplete  Body fluid culture w Gram Stain     Status: None (Preliminary result)   Collection Time: 09/05/23 11:41 AM   Specimen: PATH Cytology Pleural fluid  Result Value Ref Range Status   Specimen Description   Final    PLEURAL Performed at [facility], [address], [place], [place] [address]    Special Requests   Final    NONE Performed at [facility], [address], [place], [place] [address]    Gram Stain   Final    RARE WBC PRESENT,BOTH PMN AND MONONUCLEAR NO ORGANISMS SEEN Performed at [facility], [address], [place], [place] [address]    Culture PENDING  Incomplete   Report Status PENDING  Incomplete   Labs: CBC: Recent Labs   Lab 09/02/23 1734 09/03/23 0536 09/05/23 0648  WBC 16.1* 13.5* 11.1*  NEUTROABS 13.4*  --   --   HGB 13.0 12.7* 13.3  HCT 40.9 40.7 43.9  MCV 88.7 89.5 90.7  PLT 295 265 274   Basic Metabolic Panel: Recent Labs  Lab 09/02/23 1734 09/03/23 0536 09/05/23 0648  NA 134* 135 137  K 4.6 4.1 4.0  CL 90* 92* 95*  CO2 37* 35* 35*  GLUCOSE 99 92 100*  BUN 13 11 11   CREATININE 0.93 0.64 0.53*  CALCIUM 8.7* 8.3* 8.8*   Liver Function Tests: Recent Labs  Lab 09/02/23 1734  AST 25  ALT 23  ALKPHOS 61  BILITOT 0.4  PROT 6.8  ALBUMIN 3.2*   CBG: No results for input(s): "GLUCAP" in the last 168 hours.  Discharge  time spent: greater than 30 minutes.  Author: Lynden Oxford, MD  [facility] 09/05/2023

## 2023-09-08 LAB — CULTURE, BLOOD (ROUTINE X 2)
Culture: NO GROWTH
Culture: NO GROWTH
Special Requests: ADEQUATE
Special Requests: ADEQUATE

## 2023-09-09 LAB — CYTOLOGY - NON PAP

## 2023-09-09 LAB — BODY FLUID CULTURE W GRAM STAIN: Culture: NO GROWTH

## 2023-09-12 ENCOUNTER — Other Ambulatory Visit: Payer: Medicare HMO

## 2023-09-19 ENCOUNTER — Telehealth: Payer: Self-pay

## 2023-09-19 NOTE — Telephone Encounter (Signed)
Pt called and LVM stating that he thinks he has fluid on his lungs again.  Spoke with patient and he states that he is sluggish, very SOB and has an O2 tank at home if needed. Per Cassie, PA- Pt can come to [facility] and get chest Xray, possible thoracentesis if needed or go to the ER in [place] to be evaluated.  Pt offered chest Xray at [facility] and he stated he wasn't sure if he could make it since its 2 hours away.  Informed pt about going to the ER in [place] and he stated that he could do that since it is closer. Pt asked if he could see his provider instead of going to the ER and informed him that if he was very SOB, its possible that his provider may send him to the ER anyways. Pt stated that he will go to the ER in [place].    Informed pt about his upcoming appt with Dr. Arbutus Ped on 12/9 and he states that he can't make it by 0945 because it is too early, got to drink his coffee and it is 2 hours away. Schedule message will be sent in regards to 12/9 appt. Pt was offered earlier appt on 11/25 to follow up after ER visit on 11/15 and denied.

## 2023-09-19 NOTE — Telephone Encounter (Signed)
I called @ [facility] and pt was given appt for tomorrow @ [phone] with Dr Pasty Arch.

## 2023-09-20 ENCOUNTER — Telehealth: Payer: Self-pay | Admitting: Medical Oncology

## 2023-09-20 NOTE — Telephone Encounter (Signed)
Faxed request for Dr. Lamonte Richer  office note and CXR from today.  [facility] called and wants to be set up for US thoracentesis before his CT scan.

## 2023-09-21 ENCOUNTER — Other Ambulatory Visit: Payer: Self-pay | Admitting: Medical Oncology

## 2023-09-21 ENCOUNTER — Telehealth: Payer: Self-pay | Admitting: Medical Oncology

## 2023-09-21 DIAGNOSIS — J90 Pleural effusion, not elsewhere classified: Secondary | ICD-10-CM

## 2023-09-21 NOTE — Telephone Encounter (Signed)
Pt confirmed appt tomorrow for thoracentesis @1415  and CT scan @ 1630 an df/u 12/9 @ 0945

## 2023-09-22 ENCOUNTER — Ambulatory Visit (HOSPITAL_COMMUNITY)
Admission: RE | Admit: 2023-09-22 | Discharge: 2023-09-22 | Disposition: A | Discharge status: Home / Self Care | Payer: Medicare HMO | Source: Ambulatory Visit | Attending: Radiology | Admitting: Radiology

## 2023-09-22 ENCOUNTER — Ambulatory Visit (HOSPITAL_COMMUNITY)
Admission: RE | Admit: 2023-09-22 | Discharge: 2023-09-22 | Disposition: A | Discharge status: Home / Self Care | Payer: Medicare HMO | Source: Ambulatory Visit | Attending: Internal Medicine | Admitting: Internal Medicine

## 2023-09-22 ENCOUNTER — Other Ambulatory Visit: Payer: Self-pay | Admitting: Internal Medicine

## 2023-09-22 ENCOUNTER — Ambulatory Visit (HOSPITAL_COMMUNITY)
Admission: RE | Admit: 2023-09-22 | Discharge: 2023-09-22 | Disposition: A | Discharge status: Home / Self Care | Payer: Medicare HMO | Source: Ambulatory Visit | Attending: Physician Assistant | Admitting: Physician Assistant

## 2023-09-22 DIAGNOSIS — C3411 Malignant neoplasm of upper lobe, right bronchus or lung: Secondary | ICD-10-CM | POA: Diagnosis not present

## 2023-09-22 DIAGNOSIS — J90 Pleural effusion, not elsewhere classified: Secondary | ICD-10-CM

## 2023-09-22 DIAGNOSIS — I700 Atherosclerosis of aorta: Secondary | ICD-10-CM | POA: Diagnosis not present

## 2023-09-22 DIAGNOSIS — K429 Umbilical hernia without obstruction or gangrene: Secondary | ICD-10-CM | POA: Diagnosis not present

## 2023-09-22 DIAGNOSIS — N433 Hydrocele, unspecified: Secondary | ICD-10-CM | POA: Diagnosis not present

## 2023-09-22 DIAGNOSIS — C3490 Malignant neoplasm of unspecified part of unspecified bronchus or lung: Secondary | ICD-10-CM | POA: Diagnosis present

## 2023-09-22 DIAGNOSIS — I7121 Aneurysm of the ascending aorta, without rupture: Secondary | ICD-10-CM | POA: Diagnosis not present

## 2023-09-22 MED ORDER — IOHEXOL 300 MG/ML  SOLN
100.0000 mL | Freq: Once | INTRAMUSCULAR | Status: AC | PRN
  Administered 2023-09-22: 100 mL via INTRAVENOUS

## 2023-09-22 MED ORDER — LIDOCAINE HCL 1 % IJ SOLN
INTRAMUSCULAR | Status: AC
  Filled 2023-09-22: qty 20

## 2023-09-22 NOTE — Procedures (Signed)
Ultrasound-guided  therapeutic left thoracentesis performed yielding 1.8 liters of blood-tinged fluid. No immediate complications. Follow-up chest x-ray pending. EBL < 2 cc.  Due to pt coughing  and chest discomfort only the above amount of fluid was removed today. The pleural fluid collection is multiloculated/multiseptated.

## 2023-09-23 ENCOUNTER — Telehealth: Payer: Self-pay

## 2023-09-23 NOTE — Telephone Encounter (Signed)
Pt called this morning to inform us he received a thoracentesis and CT scan yesterday and wants to know what the plan is.  I informed him that he has an appt with Dr. Arbutus Ped on Monday 12/9 and they will discuss the plan at the appt.  Patient stated that he will try his best to be here on time. Pt verbalized understanding.

## 2023-09-26 ENCOUNTER — Inpatient Hospital Stay: Payer: Medicare HMO | Attending: Internal Medicine | Admitting: Internal Medicine

## 2023-09-26 ENCOUNTER — Encounter: Payer: Self-pay | Admitting: Internal Medicine

## 2023-09-26 VITALS — BP 131/86 | HR 106 | Temp 98.2°F | Resp 17 | Wt 173.6 lb

## 2023-09-26 DIAGNOSIS — M25512 Pain in left shoulder: Secondary | ICD-10-CM | POA: Diagnosis not present

## 2023-09-26 DIAGNOSIS — Z08 Encounter for follow-up examination after completed treatment for malignant neoplasm: Secondary | ICD-10-CM | POA: Insufficient documentation

## 2023-09-26 DIAGNOSIS — Z85118 Personal history of other malignant neoplasm of bronchus and lung: Secondary | ICD-10-CM | POA: Insufficient documentation

## 2023-09-26 DIAGNOSIS — C7800 Secondary malignant neoplasm of unspecified lung: Secondary | ICD-10-CM | POA: Diagnosis not present

## 2023-09-26 MED ORDER — PROCHLORPERAZINE MALEATE 10 MG PO TABS: 10.0000 mg | ORAL_TABLET | Freq: Four times a day (QID) | ORAL | 1 refills | Status: DC | PRN

## 2023-09-26 NOTE — Progress Notes (Signed)
[facility] [phone]   [phone]  OFFICE PROGRESS NOTE  [name], [name] Deem, MD [address] [place] [address]  DIAGNOSIS: Stage IV (T3, N0, M1 a) non-small cell lung cancer, squamous cell carcinoma presented with multiple bilateral pulmonary nodules diagnosed in April 2018.   PRIOR THERAPY: 1) Systemic chemotherapy with carboplatin for AUC of 5 and paclitaxel 175 MG/M2 every 3 weeks with Neulasta support. Status post 6 cycles. 2) Second line treatment with immunotherapy was Nivolumab 480 mg IV every 4 weeks.  First cycle November 14, 2017.  Status post 28 cycles. The patient opted to take a break from treatment.   CURRENT THERAPY: Observation  INTERVAL HISTORY: [name] [name] 72 y.o. male returns to the clinic today for follow-up visit.Discussed the use of AI scribe software for clinical note transcription with the patient, who gave verbal consent to proceed.  History of Present Illness   The patient, previously diagnosed with non-small cell lung cancer squamous cell carcinoma, underwent six cycles of chemotherapy with carboplatin and paclitaxel in April 2018. Following this, he received 28 cycles of nivolumab 480 mg intravenously every four weeks, starting in January 2019. The patient has been on observation since 2021.  Recently, he was admitted to the hospital due to difficulty breathing. During the hospital stay, a large amount of fluid was found and drained from the left pleural space. Cytology of the fluid was consistent with his previous cancer diagnosis.  Currently, the patient reports feeling tired and experiencing pain in the left shoulder. He also describes occasional sharp chest pain. Despite the drainage of fluid, he notes that his shortness of breath has not improved significantly, even with the use of oxygen. He denies any nausea, vomiting, diarrhea, or recent weight loss. In fact, he has gained weight since 2022.  The  patient has been on observation and has not been working as a [employer]. He expresses willingness to travel for treatment every four weeks. He also expresses concern about the potential financial burden of treatment.       MEDICAL HISTORY: Past Medical History:  Diagnosis Date   AAA (abdominal aortic aneurysm) (HCC)    per CT in epic 10-17-2020 3.6cm   Bilateral iliac artery aneurysm (HCC)    per CT in epic 10-17-2020  1.9cm   Carotid occlusion, right    with infarction 2017   Chronic diarrhea    COPD (chronic obstructive pulmonary disease) (HCC)    previously has seen pulmonolgy, dr Demetrius Charity. mannan (lov 09-29-2018 in epic) ;   (12-19-2020  pt stated no oxygen, uses rescue inhaler as needed, and sob with stairs)   Depression    Diverticulosis of colon    DOE (dyspnea on exertion)    per pt sob w/ stairs and recovers after few minutes   Dyspnea    GERD (gastroesophageal reflux disease)    History of antineoplastic chemotherapy 01/2017  to 09/ 2019   lung cancer   History of cerebrovascular accident (CVA) from right carotid artery occlusion involving right middle cerebral artery territory 2018     (last neurology note 11-14-2017 pt released on prn basis back to pcp follow up)   neurologist--- dr v. Marjory Lies--- left hand weakness/ numbing intermittantly in setting of lung cancer, MRI 12/ 2017 right frontal ishcemic infartions(watershed vs. embolic infarts due to right ICA stenosis or occlusion vs. cerebral metastateses) confirmed by duplex occluded right ICA  (03-04-20022 pt stated no residual)  History of duodenal ulcer 02/2017   perforated pylori channel ulcer s/p surgery , modified graham's patch   History of MRSA infection    remote   History of radiation therapy 08/2018   SRBT left upper lobe   History of sepsis 05/07/2019   hospital admission pt dehydrated with AKI--- resolved   Hypertension    Left carotid stenosis    per duplex 40-59%   Metastatic squamous cell carcinoma to  lung, unspecified laterality [facility]) oncologist-- dr Ardelle Anton   dx 04/ 2018 , bronchoscopy w/ bx's for bilateral pulm. nodules,  Stage 4 non-small cell ; completed chemo 09/ 2019 and had immunotherapy and SBRT to left upper lobe 11/ 2019   Peripheral neuropathy due to chemotherapy [facility])    Right inguinal hernia    Smokers' cough (HCC)    per pt mostly productive in am    Wears glasses     ALLERGIES:  has No Known Allergies.  MEDICATIONS:  Current Outpatient Medications  Medication Sig Dispense Refill   acetaminophen (TYLENOL) 325 MG tablet You can take 2 every 4 hours as needed.  You can buy it over the counter DO NOT TAKE MORE THAN 4000 MG OF TYLENOL PER DAY.  IT CAN HARM YOUR LIVER.     albuterol (VENTOLIN HFA) 108 (90 Base) MCG/ACT inhaler Inhale into the lungs every 6 (six) hours as needed for wheezing or shortness of breath.     amitriptyline (ELAVIL) 100 MG tablet Take 1 tablet (100 mg total) by mouth at bedtime. 30 tablet 3   aspirin EC 81 MG tablet Take 81 mg by mouth daily.     benzonatate (TESSALON) 100 MG capsule Take 100 mg by mouth 3 (three) times daily as needed for cough.     feeding supplement (ENSURE ENLIVE / ENSURE PLUS) LIQD Take 237 mLs by mouth 3 (three) times daily between meals. 10000 mL 0   finasteride (PROSCAR) 5 MG tablet Take 5 mg by mouth daily.     gabapentin (NEURONTIN) 300 MG capsule TAKE 2 CAPSULES BY MOUTH THREE TIMES DAILY (Patient taking differently: Take 600 mg by mouth 2 (two) times daily.) 90 capsule 0   lisinopril (ZESTRIL) 2.5 MG tablet Take 2.5 mg by mouth daily.     loperamide (IMODIUM) 2 MG capsule Take 1 capsule (2 mg total) by mouth 2 (two) times daily. Also available over the counter. You can take 1 extra dose in the afternoon if diarrhea is not improving. (Patient not taking: Reported on 09/02/2023) 60 capsule 4   metoprolol succinate (TOPROL-XL) 25 MG 24 hr tablet Take 1 tablet (25 mg total) by mouth daily. 30 tablet 4   mirtazapine (REMERON)  30 MG tablet TAKE 1 TABLET BY MOUTH AT BEDTIME (Patient taking differently: Take 30 mg by mouth at bedtime.) 30 tablet 0   nicotine (NICODERM CQ - DOSED IN MG/24 HOURS) 21 mg/24hr patch Place 1 patch (21 mg total) onto the skin daily. 28 patch 0   nicotine polacrilex (NICORETTE) 4 MG gum Take 1 each (4 mg total) by mouth as needed for smoking cessation. 100 tablet 0   Omega-3 Fatty Acids (OMEGA-3 FISH OIL PO) Take 1 capsule by mouth daily.     omeprazole (PRILOSEC) 20 MG capsule Take 20 mg by mouth 2 (two) times daily before a meal.     polyethylene glycol (MIRALAX / GLYCOLAX) 17 g packet Take 17 g by mouth daily as needed for mild constipation. 14 each 0   simvastatin (ZOCOR)  20 MG tablet Take 20 mg by mouth daily.     tamsulosin (FLOMAX) 0.4 MG CAPS capsule Take 0.4 mg by mouth daily. (Patient not taking: Reported on 09/02/2023)     temazepam (RESTORIL) 30 MG capsule Take 1 capsule (30 mg total) by mouth at bedtime. (Patient taking differently: Take 30 mg by mouth at bedtime.) 30 capsule 0   No current facility-administered medications for this visit.    SURGICAL HISTORY:  Past Surgical History:  Procedure Laterality Date   COLONOSCOPY     INGUINAL HERNIA REPAIR Right 12/25/2020   Procedure: RIGHT OPEN INGUINAL HERNIA REPAIR WITH MESH;  Surgeon: Kinsinger, De Blanch, MD;  Location: [facility] [place];  Service: General;  Laterality: Right;  ROOM 3 STARTING AT 09:30AM FOR 1 HOUR   LAPAROTOMY N/A 02/25/2017   Procedure: EXPLORATORY LAPAROTOMY MODIFIED GRAHAM'S PATCH;  Surgeon: Rodman Pickle, MD;  Location: [facility];  Service: General;  Laterality: N/A;   MINOR PLACEMENT OF FIDUCIAL N/A 01/19/2017   Procedure: MINOR PLACEMENT OF FIDUCIAL x 6;  Surgeon: Leslye Peer, MD;  Location: [facility];  Service: Thoracic;  Laterality: N/A;   PILONIDAL CYST EXCISION  2019   VIDEO BRONCHOSCOPY WITH ENDOBRONCHIAL NAVIGATION N/A 01/19/2017   Procedure: VIDEO BRONCHOSCOPY WITH ENDOBRONCHIAL  NAVIGATION;  Surgeon: Leslye Peer, MD;  Location: [facility];  Service: Thoracic;  Laterality: N/A;    REVIEW OF SYSTEMS:  Constitutional: positive for anorexia and fatigue Eyes: negative Ears, nose, mouth, throat, and face: negative Respiratory: positive for dyspnea on exertion Cardiovascular: negative Gastrointestinal: negative Genitourinary:negative Integument/breast: negative Hematologic/lymphatic: negative Musculoskeletal:negative Neurological: negative Behavioral/Psych: negative Endocrine: negative Allergic/Immunologic: negative   PHYSICAL EXAMINATION: General appearance: alert, cooperative, fatigued, and no distress Head: Normocephalic, without obvious abnormality, atraumatic Neck: no adenopathy, no JVD, supple, symmetrical, trachea midline, and thyroid not enlarged, symmetric, no tenderness/mass/nodules Lymph nodes: Cervical, supraclavicular, and axillary nodes normal. Resp: diminished breath sounds LLL and dullness to percussion LLL Back: symmetric, no curvature. ROM normal. No CVA tenderness. Cardio: regular rate and rhythm, S1, S2 normal, no murmur, click, rub or gallop GI: soft, non-tender; bowel sounds normal; no masses,  no organomegaly Extremities: extremities normal, atraumatic, no cyanosis or edema Neurologic: Alert and oriented X 3, normal strength and tone. Normal symmetric reflexes. Normal coordination and gait  ECOG PERFORMANCE STATUS: 1 - Symptomatic but completely ambulatory  Blood pressure 131/86, pulse (!) 106, temperature 98.2 F (36.8 C), temperature source Temporal, resp. rate 17, weight 173 lb 9.6 oz (78.7 kg), SpO2 (!) 80%.  LABORATORY DATA: Lab Results  Component Value Date   WBC 11.1 (H) 09/05/2023   HGB 13.3 09/05/2023   HCT 43.9 09/05/2023   MCV 90.7 09/05/2023   PLT 274 09/05/2023      Chemistry      Component Value Date/Time   NA 137 09/05/2023 0648   NA 139 10/06/2017 1052   K 4.0 09/05/2023 0648   K 4.2 10/06/2017 1052   CL 95 (L)  09/05/2023 0648   CO2 35 (H) 09/05/2023 0648   CO2 25 10/06/2017 1052   BUN 11 09/05/2023 0648   BUN 13.2 10/06/2017 1052   CREATININE 0.53 (L) 09/05/2023 0648   CREATININE 0.81 04/06/2023 1521   CREATININE 0.9 10/06/2017 1052      Component Value Date/Time   CALCIUM 8.8 (L) 09/05/2023 0648   CALCIUM 9.5 10/06/2017 1052   ALKPHOS 61 09/02/2023 1734   ALKPHOS 68 10/06/2017 1052   AST 25 09/02/2023 1734   AST 15  04/06/2023 1521   AST 14 10/06/2017 1052   ALT 23 09/02/2023 1734   ALT 9 04/06/2023 1521   ALT 13 10/06/2017 1052   BILITOT 0.4 09/02/2023 1734   BILITOT 0.4 04/06/2023 1521   BILITOT 0.44 10/06/2017 1052       RADIOGRAPHIC STUDIES: CT CHEST ABDOMEN PELVIS W CONTRAST  Result Date: 09/22/2023 CLINICAL DATA:  Non-small cell lung cancer restaging * Tracking Code: BO * EXAM: CT CHEST, ABDOMEN, AND PELVIS WITH CONTRAST TECHNIQUE: Multidetector CT imaging of the chest, abdomen and pelvis was performed following the standard protocol during bolus administration of intravenous contrast. RADIATION DOSE REDUCTION: This exam was performed according to the departmental dose-optimization program which includes automated exposure control, adjustment of the mA and/or kV according to patient size and/or use of iterative reconstruction technique. CONTRAST:  OMNIPAQUE IOHEXOL 300 MG/ML  SOLN COMPARISON:  Multiple exams, including 09/02/2023 and 04/06/2023 FINDINGS: CT CHEST FINDINGS Cardiovascular: Coronary, aortic arch, and branch vessel atherosclerotic vascular disease. Ascending thoracic aortic aneurysm 4.2 cm in diameter on image 34 series 2, stable by my measurements. Mediastinum/Nodes: Right hilar node 1.1 cm in short axis on image 30 series 2, previously 1.2 cm. Lungs/Pleura: Moderate to large left pleural effusion with thick enhancing rind along the pleural margins indicating exudative effusion possibly malignant effusion. Emphysema noted. Coarse reticulonodular interstitial  accentuation in the left lung favoring the upper lobe, a component of lymphangitic carcinomatosis is not excluded. Nodularity along the left anterior pleural margin. Continued band of abnormal density along the upper major fissure and extending in the left upper lobe, the component adjacent to the fiducials measures 1.7 cm in thickness on 66 of series 4, formerly the same. There is some increased atelectasis and consolidation in the lingula, tumor involvement or pneumonia in the lingula not excluded. Spiculated nodule in the apical segment right upper lobe 2.4 by 1.1 cm on image 24 series 4, roughly stable. A smaller posterior nodule in the right upper lobe measuring 0.7 cm in long axis on image 31 series 4 previously measured the same by my measurements. Musculoskeletal: Fractures of the left fourth and fifth ribs with surrounding osteoid, similar to previous, cannot exclude pathologic fractures. Interbody fusion at C6-7. Thoracic spondylosis. CT ABDOMEN PELVIS FINDINGS Hepatobiliary: Unremarkable Pancreas: Unremarkable Spleen: Unremarkable Adrenals/Urinary Tract: Stable low-density thickening of both adrenal glands. No focal adrenal mass. Wall thickening in the urinary bladder, cannot exclude cystitis although some of this may be from nondistention. Scarring in the right kidney upper pole. Stomach/Bowel: Sigmoid colon diverticulosis. Descending colon diverticulosis. Vascular/Lymphatic: Atherosclerosis is present, including aortoiliac atherosclerotic disease. Stable 3.6 cm infrarenal abdominal aortic aneurysm. Reproductive: Small bilateral scrotal hydroceles. Other: No supplemental non-categorized findings. Musculoskeletal: Umbilical and supraumbilical hernias contain adipose tissue. Bridging spurring of both sacroiliac joints. Degenerative arthropathy of both hips. IMPRESSION: 1. Moderate to large left pleural effusion with thick enhancing rind along the pleural margins indicating exudative effusion possibly  malignant effusion. 2. Coarse reticulonodular interstitial accentuation in the left lung favoring the upper lobe, a component of lymphangitic carcinomatosis is not excluded. 3. There is some increased atelectasis and consolidation in the lingula, tumor involvement or pneumonia in the lingula not excluded. 4. Stable band of abnormal density along the upper major fissure and extending in the left upper lobe, the component adjacent to the fiducials measures 1.7 cm in thickness. 5. Stable spiculated nodule in the apical segment right upper lobe measuring 2.4 by 1.1 cm. A smaller posterior nodule in the right upper lobe measuring 0.7  cm in long axis previously measured the same by my measurements. 6. Stable right hilar node measuring 1.1 cm in short axis. 7. Stable fractures of the left fourth and fifth ribs with surrounding osteoid, cannot exclude pathologic fractures. 8. Stable 3.6 cm infrarenal abdominal aortic aneurysm. Recommend follow-up ultrasound every 2 years. 9. Ascending thoracic aortic aneurysm 4.2 cm in diameter. This can probably be followed in the context of the patient's expected oncology imaging. Otherwise, recommend annual imaging followup by CTA or MRA. This recommendation follows 2010 ACCF/AHA/AATS/ACR/ASA/SCA/SCAI/SIR/STS/SVM Guidelines for the Diagnosis and Management of Patients with Thoracic Aortic Disease. Circulation. 2010; 121: A213-Y865. Aortic aneurysm NOS (ICD10-I71.9) 10. Stable low-density thickening of both adrenal glands. 11. Wall thickening in the urinary bladder, cannot exclude cystitis although some of this may be from nondistention. 12. Umbilical and supraumbilical hernias contain adipose tissue. 13. Small bilateral scrotal hydroceles. Aortic Atherosclerosis (ICD10-I70.0) and Emphysema (ICD10-J43.9). Electronically Signed   By: Gaylyn Rong M.D.   On: 09/22/2023 16:36   DG Chest 1 View  Result Date: 09/22/2023 CLINICAL DATA:  Lung cancer.  Left thoracentesis. EXAM: CHEST  1  VIEW COMPARISON:  09/05/2023 FINDINGS: Large residual left pleural effusion. Irregularity along the pleural margin at the left lung apex. Atherosclerotic calcification of the aortic arch. The right lung appears clear. Continued obscuration of the cardiac border by residual pleural effusion and airspace opacity. Hazy density in the aerated portion of the left upper lobe. Elevated left hemidiaphragm. IMPRESSION: 1. Large residual left pleural effusion.  No pneumothorax. 2. Irregularity along the pleural margin at the left lung apex. 3. Hazy interstitial accentuation in the aerated portion of the left upper lobe. 4. Elevated left hemidiaphragm. 5. Aortic Atherosclerosis (ICD10-I70.0). Electronically Signed   By: Gaylyn Rong M.D.   On: 09/22/2023 16:25   US THORACENTESIS ASP PLEURAL SPACE W/IMG GUIDE  Result Date: 09/22/2023 INDICATION: Patient with history of stage IV squamous cell carcinoma of the lung, recurrent left pleural effusion. Request received for therapeutic left thoracentesis. EXAM: ULTRASOUND GUIDED THERAPEUTIC LEFT THORACENTESIS MEDICATIONS: 8 mL 1% lidocaine COMPLICATIONS: None immediate. PROCEDURE: An ultrasound guided thoracentesis was thoroughly discussed with the patient and questions answered. The benefits, risks, alternatives and complications were also discussed. The patient understands and wishes to proceed with the procedure. Written consent was obtained. Ultrasound was performed to localize and mark an adequate pocket of fluid in the left chest. The area was then prepped and draped in the normal sterile fashion. 1% Lidocaine was used for local anesthesia. Under ultrasound guidance a 6 Fr Safe-T-Centesis catheter was introduced. Thoracentesis was performed. The catheter was removed and a dressing applied. FINDINGS: A total of approximately 1.8 liters of blood-tinged fluid was removed. Due to persistent patient coughing and chest discomfort only the above amount of fluid was removed  today. The pleural fluid collection was multiloculated/multi-septated. IMPRESSION: Successful ultrasound guided therapeutic left thoracentesis yielding 1.8 liters of pleural fluid. Performed by: Artemio Aly Electronically Signed   By: Marliss Coots M.D.   On: 09/22/2023 14:43   US THORACENTESIS ASP PLEURAL SPACE W/IMG GUIDE  Result Date: 09/05/2023 INDICATION: Patient with history of stage IV squamous cell carcinoma of lung, recurrent left pleural effusion. Request received for diagnostic and therapeutic left thoracentesis. EXAM: ULTRASOUND GUIDED DIAGNOSTIC AND THERAPEUTIC LEFT THORACENTESIS MEDICATIONS: 8 ml 1% lidocaine COMPLICATIONS: None immediate. PROCEDURE: An ultrasound guided thoracentesis was thoroughly discussed with the patient and questions answered. The benefits, risks, alternatives and complications were also discussed. The patient understands and wishes to proceed with  the procedure. Written consent was obtained. Ultrasound was performed to localize and mark an adequate pocket of fluid in the left chest. The area was then prepped and draped in the normal sterile fashion. 1% Lidocaine was used for local anesthesia. Under ultrasound guidance a 6 Fr Safe-T-Centesis catheter was introduced. Thoracentesis was performed. The catheter was removed and a dressing applied. FINDINGS: A total of approximately 1.3 liters of blood-tinged fluid was removed. Samples were sent to the laboratory as requested by the clinical team. Due to chest discomfort only the above amount of fluid was removed today. IMPRESSION: Successful ultrasound guided diagnostic and therapeutic LEFT thoracentesis yielding 1.3 L of pleural fluid. Performed by: Artemio Aly Electronically Signed   By: Roanna Banning M.D.   On: 09/05/2023 14:10   DG Chest 1 View  Result Date: 09/05/2023 CLINICAL DATA:  Status post thoracentesis. EXAM: CHEST  1 VIEW COMPARISON:  09/03/2023. FINDINGS: Redemonstration of small-to-moderate left  pleural effusion with probable associated compressive atelectatic changes in the left lung. There is interval decrease in the pleural effusion when compared to the prior exam, secondary to thoracentesis. No pneumothorax seen. Bilateral lung fields are otherwise clear. Right lateral costophrenic angle is clear. Evaluation of cardiomediastinal silhouette is limited due to left lower hemithorax opacification. However, no significant interval change in the interim. No acute osseous abnormalities. The soft tissues are within normal limits. IMPRESSION: *Small-to-moderate left pleural effusion, decreased since the prior study, status post left-sided thoracentesis. No pneumothorax. Electronically Signed   By: Jules Schick M.D.   On: 09/05/2023 13:55   US THORACENTESIS ASP PLEURAL SPACE W/IMG GUIDE  Result Date: 09/03/2023 INDICATION: Patient with history of stage IV non-small cell lung cancer s/p chemotherapy and radiation admitted with worsening dyspnea, found to have new large left pleural effusion. Request for diagnostic and therapeutic left thoracentesis. EXAM: ULTRASOUND GUIDED LEFT THORACENTESIS MEDICATIONS: 7 mL 1% lidocaine. COMPLICATIONS: None immediate. PROCEDURE: An ultrasound guided thoracentesis was thoroughly discussed with the patient and questions answered. The benefits, risks, alternatives and complications were also discussed. The patient understands and wishes to proceed with the procedure. Written consent was obtained. Ultrasound was performed to localize and mark an adequate pocket of fluid in the left chest. The area was then prepped and draped in the normal sterile fashion. 1% Lidocaine was used for local anesthesia. Under ultrasound guidance a 6 Fr Safe-T-Centesis catheter was introduced. Thoracentesis was performed. The catheter was removed and a dressing applied. FINDINGS: A total of approximately 2.0 L of clear, light amber fluid was removed. Samples were sent to the laboratory as requested  by the clinical team. Significant residual fluid seen on post procedure ultrasound. IMPRESSION: Successful ultrasound guided left thoracentesis yielding 2.0 L of pleural fluid. Performed by Lynnette Caffey, PA-C Electronically Signed   By: Richarda Overlie M.D.   On: 09/03/2023 15:43   DG Chest Port 1 View  Result Date: 09/03/2023 CLINICAL DATA:  Left thoracentesis EXAM: PORTABLE CHEST 1 VIEW COMPARISON:  09/02/2023 FINDINGS: Moderate sized left-sided pleural effusion has decreased following thoracentesis. Left pleural thickening and nodularity. Fiduciary markers are present bilaterally. Stable heart size. Aortic atherosclerosis. Right lung is clear. No pneumothorax. IMPRESSION: Moderate sized left-sided pleural effusion has decreased following thoracentesis. No pneumothorax. Electronically Signed   By: Duanne Guess D.O.   On: 09/03/2023 13:00   CT CHEST WO CONTRAST  Result Date: 09/02/2023 CLINICAL DATA:  Lung cancer, increasing shortness of breath, pleural effusion EXAM: CT CHEST WITHOUT CONTRAST TECHNIQUE: Multidetector CT imaging of the chest  was performed following the standard protocol without IV contrast. RADIATION DOSE REDUCTION: This exam was performed according to the departmental dose-optimization program which includes automated exposure control, adjustment of the mA and/or kV according to patient size and/or use of iterative reconstruction technique. COMPARISON:  09/02/2023, 05/12/2023, 04/06/2023 FINDINGS: Cardiovascular: Unenhanced imaging of the heart is unremarkable without pericardial effusion. 4.3 cm ascending thoracic aortic aneurysm is unchanged. Evaluation of the vascular lumen is limited without IV contrast. Stable atherosclerosis of the aorta and coronary vasculature. Mediastinum/Nodes: No enlarged mediastinal or axillary lymph nodes. Thyroid gland, trachea, and esophagus demonstrate no significant findings. Lungs/Pleura: There is a large loculated left basilar pleural effusion,  volume estimated greater than 2 L. significant compressive atelectasis at the left lung base. Circumferential irregular left pleural thickening concerning for pleural based metastases in this patient with a known history of lung cancer. Progressive bandlike area of consolidation within the left upper lobe corresponding to the metabolically active region on recent PET scan, now measuring up to 2.2 cm and consistent with progressive malignancy. 2.2 x 1.4 cm spiculated right apical mass consistent with second primary malignancy, reference image 26/4, which has progressed since prior study. Adjacent 0.9 cm spiculated nodule right upper lobe image 38/4 is unchanged. No evidence of pneumothorax.  Central airways are patent. Upper Abdomen: Stable bilateral low-attenuation adrenal thickening. No acute upper abdominal findings. Musculoskeletal: The chronic pathologic fracture within the left lateral fourth and fifth ribs unchanged since prior exam. No new acute or destructive bony abnormalities. Reconstructed images demonstrate no additional findings. IMPRESSION: 1. Large loculated left basilar pleural effusion, with circumferential nodular left pleural thickening, consistent with malignant effusion and pleural based metastases. 2. Progressive spiculated right apical mass and band like consolidation within the left upper lobe, consistent with progressive lung cancer. 3. Stable spiculated subcentimeter right upper lobe pulmonary nodule, continued follow-up recommended. 4. Stable 4.3 cm ascending thoracic aortic aneurysm. Evaluation of the vascular lumen is limited without IV contrast. Recommend annual imaging followup by CTA or MRA. This recommendation follows 2010 ACCF/AHA/AATS/ACR/ASA/SCA/SCAI/SIR/STS/SVM Guidelines for the Diagnosis and Management of Patients with Thoracic Aortic Disease. Circulation. 2010; 121: G956-O130. Aortic aneurysm NOS (ICD10-I71.9) 5. Aortic Atherosclerosis (ICD10-I70.0). Coronary artery  atherosclerosis. Electronically Signed   By: Sharlet Salina M.D.   On: 09/02/2023 21:42   DG Chest Port 1 View  Result Date: 09/02/2023 CLINICAL DATA:  SOB. EXAM: PORTABLE CHEST 1 VIEW COMPARISON:  05/07/2019. FINDINGS: Large left-sided pleural effusion. Cardiac silhouette is obscured. Right lung is clear. Aorta is ectatic and calcified. No pneumothorax. Normal pulmonary vasculature. Thoracic degenerative changes. IMPRESSION: Large left-sided pleural effusion. Electronically Signed   By: Layla Maw M.D.   On: 09/02/2023 19:22    ASSESSMENT AND PLAN: This is a very pleasant 71 years old white male with stage IV non-small cell lung cancer, squamous cell carcinoma presented with multiple bilateral pulmonary nodules. The patient was treated with systemic chemotherapy with carboplatin and paclitaxel status post 6 cycles. He tolerated the last cycle of his treatment well except for the persistent peripheral neuropathy. The patient was on observation for the last 3 months. Repeat imaging studies including CT scan of the chest, abdomen and pelvis scan showed mild interval enlargement of left upper lobe as well as right upper lobe pulmonary nodules concerning for disease progression. The patient started on treatment with immunotherapy with Nivolumab 480 mg IV every 4 weeks status post 28 cycles.  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  He has  been on observation for more than 2 years.  He had repeat CT scan of the chest, abdomen and pelvis performed recently.  I personally and independently reviewed the scan and discussed the result with the patient today.  He also had recurrent left malignant pleural effusion    Recurrent Non-Small Cell Lung Cancer (NSCLC) Recurrent NSCLC, squamous cell carcinoma subtype, with pleural effusion. Previously treated with carboplatin, paclitaxel, and nivolumab. Recent hospitalization for shortness of breath revealed  a large pleural effusion, cytology consistent with NSCLC. Symptoms include fatigue, left shoulder pain, and intermittent sharp chest pain. Persistent shortness of breath despite oxygen therapy. Weight gain noted since 2022. Discussed resuming nivolumab 480 mg IV every four weeks. Patient agreed to start after Christmas or New Year. Explained that immunotherapy can last up to two years and involves monthly visits. Patient prefers to continue treatment at current location despite travel distance. - Resume nivolumab 480 mg IV every four weeks starting the first or second week of January - Monitor for reaccumulation of pleural fluid and manage with drainage as needed - Advise patient to call if experiencing increased shortness of breath or new/worsening symptoms - Arrange for follow-up visits every four weeks for treatment and monitoring  Pleural Effusion Pleural effusion secondary to recurrent NSCLC. Large volume (1.8 liters) drained during recent hospitalization. Persistent shortness of breath despite oxygen therapy. Discussed monitoring for symptoms of reaccumulation and the need for thoracentesis if fluid reaccumulates. - Monitor for symptoms of reaccumulation (increased shortness of breath, sharp pain) - Perform thoracentesis if fluid reaccumulates - Advise patient to report any new or worsening symptoms promptly  General Health Maintenance General health maintenance discussed in the context of cancer treatment. No recent weight loss; weight gain noted. - Encourage patient to maintain a healthy weight and monitor for any significant changes  Follow-up - Schedule follow-up visit for the first or second week of January for nivolumab administration - Ensure insurance coverage for nivolumab treatment.   The patient was advised to call immediately if he has any other concerning symptoms in the interval. The patient voices understanding of current disease status and treatment options and is in  agreement with the current care plan.  All questions were answered. The patient knows to call the clinic with any problems, questions or concerns. We can certainly see the patient much sooner if necessary. The total time spent in the appointment was 40 minutes.  Disclaimer: This note was dictated with voice recognition software. Similar sounding words can inadvertently be transcribed and may not be corrected upon review.

## 2023-09-26 NOTE — Progress Notes (Signed)
ON PATHWAY REGIMEN - Non-Small Cell Lung  No Change  Continue With Treatment as Ordered.  Original Decision Date/Time: 11/02/2017 12:19     A cycle is every 28 days:     Nivolumab   **Always confirm dose/schedule in your pharmacy ordering system**  Patient Characteristics: Stage IV Metastatic, Squamous, PS = 0, 1, Second Line, No Prior PD-1/PD-L1  Inhibitor and Immunotherapy Candidate AJCC T Category: T3 Current Disease Status: Distant Metastases AJCC N Category: N0 AJCC M Category: M1a AJCC 8 Stage Grouping: IVA Histology: Squamous Cell Line of therapy: Second Line PD-L1 Expression Status: Quantity Not Sufficient Performance Status: PS = 0, 1 Immunotherapy Candidate Status: Candidate for Immunotherapy Prior Immunotherapy Status: No Prior PD-1/PD-L1 Inhibitor Intent of Therapy: Non-Curative / Palliative Intent, Discussed with Patient

## 2023-09-29 ENCOUNTER — Other Ambulatory Visit: Payer: Self-pay

## 2023-10-02 ENCOUNTER — Other Ambulatory Visit: Payer: Self-pay

## 2023-10-04 ENCOUNTER — Other Ambulatory Visit: Payer: Self-pay

## 2023-10-17 NOTE — Progress Notes (Signed)
 Pharmacist Chemotherapy Monitoring - Initial Assessment    Anticipated start date: 10/25/23   The following has been reviewed per standard work regarding the patient's treatment regimen: The patient's diagnosis, treatment plan and drug doses, and organ/hematologic function Lab orders and baseline tests specific to treatment regimen  The treatment plan start date, drug sequencing, and pre-medications Prior authorization status  Patient's documented medication list, including drug-drug interaction screen and prescriptions for anti-emetics and supportive care specific to the treatment regimen The drug concentrations, fluid compatibility, administration routes, and timing of the medications to be used The patient's access for treatment and lifetime cumulative dose history, if applicable  The patient's medication allergies and previous infusion related reactions, if applicable   Changes made to treatment plan:  N/A  Follow up needed:  N/A   Geralynn Capri, PharmD, MBA

## 2023-10-22 NOTE — Progress Notes (Deleted)
 [facility] OFFICE PROGRESS NOTE  Laws, [name] HERO, MD [address] [place] [address]  DIAGNOSIS: Stage IV (T3, N0, M1 a) non-small cell lung cancer, squamous cell carcinoma presented with multiple bilateral pulmonary nodules diagnosed in April 2018.   PRIOR THERAPY: 1) Systemic chemotherapy with carboplatin  for AUC of 5 and paclitaxel  175 MG/M2 every 3 weeks with Neulasta  support. Status post 6 cycles. 2) Second line treatment with immunotherapy was Nivolumab  480 mg IV every 4 weeks.  First cycle November 14, 2017.  Status post 28 cycles. The patient opted to take a break from treatment.  CURRENT THERAPY: Resuming treatment with nivolumab  480 mg IV every 4 weeks. First dose on 10/24/22.   INTERVAL HISTORY: [name] [name] 73 y.o. male returns to clinic today for follow-up visit.  The patient last saw Dr. Sherrod on 09/26/2023.  The patient has a history of non-small cell lung cancer, squamous cell carcinoma.  He was on immunotherapy for several years but had been on observation since 2021.  Recently November 2024, the patient was found to have evidence of disease progression and had been struggling with large volumes of left pleural effusions.  Cytology was consistent with his prior cancer diagnosis.  Therefore Dr. Sherrod resumed his treatment with nivolumab .  He is expected to restart this today.  Today he denies any fever, chills, or night sweats.  Appetite?  Shortness of breath?  Effusion?  Heaviness?  Cough?  Chest pain?  He denies any hemoptysis.  Denies any nausea, vomiting diarrhea, or constipation.  Denies any rashes or skin changes.  Denies any headache or visual changes.  He is here today for evaluation repeat blood work before undergoing cycle #1. MEDICAL HISTORY: Past Medical History:  Diagnosis Date   AAA (abdominal aortic aneurysm) (HCC)    per CT in epic 10-17-2020 3.6cm   Bilateral iliac artery aneurysm (HCC)    per CT in epic 10-17-2020  1.9cm   Carotid  occlusion, right    with infarction 2017   Chronic diarrhea    COPD (chronic obstructive pulmonary disease) (HCC)    previously has seen pulmonolgy, dr shaunna. mannan (lov 09-29-2018 in epic) ;   (12-19-2020  pt stated no oxygen, uses rescue inhaler as needed, and sob with stairs)   Depression    Diverticulosis of colon    DOE (dyspnea on exertion)    per pt sob w/ stairs and recovers after few minutes   Dyspnea    GERD (gastroesophageal reflux disease)    History of antineoplastic chemotherapy 01/2017  to 09/ 2019   lung cancer   History of cerebrovascular accident (CVA) from right carotid artery occlusion involving right middle cerebral artery territory 2018     (last neurology note 11-14-2017 pt released on prn basis back to pcp follow up)   neurologist--- dr v. margaret--- left hand weakness/ numbing intermittantly in setting of lung cancer, MRI 12/ 2017 right frontal ishcemic infartions(watershed vs. embolic infarts due to right ICA stenosis or occlusion vs. cerebral metastateses) confirmed by duplex occluded right ICA  (03-04-20022 pt stated no residual)   History of duodenal ulcer 02/2017   perforated pylori channel ulcer s/p surgery , modified graham's patch   History of MRSA infection    remote   History of radiation therapy 08/2018   SRBT left upper lobe   History of sepsis 05/07/2019   hospital admission pt dehydrated with AKI--- resolved   Hypertension    Left carotid stenosis  per duplex 40-59%   Metastatic squamous cell carcinoma to lung, unspecified laterality [facility]) oncologist-- dr christella kluver   dx 04/ 2018 , bronchoscopy w/ bx's for bilateral pulm. nodules,  Stage 4 non-small cell ; completed chemo 09/ 2019 and had immunotherapy and SBRT to left upper lobe 11/ 2019   Peripheral neuropathy due to chemotherapy [facility])    Right inguinal hernia    Smokers' cough (HCC)    per pt mostly productive in am    Wears glasses     ALLERGIES:  has no known allergies.  MEDICATIONS:   Current Outpatient Medications  Medication Sig Dispense Refill   acetaminophen  (TYLENOL ) 325 MG tablet You can take 2 every 4 hours as needed.  You can buy it over the counter DO NOT TAKE MORE THAN 4000 MG OF TYLENOL  PER DAY.  IT CAN HARM YOUR LIVER.     albuterol  (VENTOLIN  HFA) 108 (90 Base) MCG/ACT inhaler Inhale into the lungs every 6 (six) hours as needed for wheezing or shortness of breath.     amitriptyline  (ELAVIL ) 100 MG tablet Take 1 tablet (100 mg total) by mouth at bedtime. 30 tablet 3   aspirin  EC 81 MG tablet Take 81 mg by mouth daily.     benzonatate  (TESSALON ) 100 MG capsule Take 100 mg by mouth 3 (three) times daily as needed for cough.     feeding supplement (ENSURE ENLIVE / ENSURE PLUS) LIQD Take 237 mLs by mouth 3 (three) times daily between meals. 10000 mL 0   finasteride  (PROSCAR ) 5 MG tablet Take 5 mg by mouth daily.     gabapentin  (NEURONTIN ) 300 MG capsule TAKE 2 CAPSULES BY MOUTH THREE TIMES DAILY (Patient taking differently: Take 600 mg by mouth 2 (two) times daily.) 90 capsule 0   lisinopril  (ZESTRIL ) 2.5 MG tablet Take 2.5 mg by mouth daily.     loperamide  (IMODIUM ) 2 MG capsule Take 1 capsule (2 mg total) by mouth 2 (two) times daily. Also available over the counter. You can take 1 extra dose in the afternoon if diarrhea is not improving. (Patient not taking: Reported on 09/02/2023) 60 capsule 4   metoprolol  succinate (TOPROL -XL) 25 MG 24 hr tablet Take 1 tablet (25 mg total) by mouth daily. 30 tablet 4   mirtazapine  (REMERON ) 30 MG tablet TAKE 1 TABLET BY MOUTH AT BEDTIME (Patient taking differently: Take 30 mg by mouth at bedtime.) 30 tablet 0   nicotine  (NICODERM CQ  - DOSED IN MG/24 HOURS) 21 mg/24hr patch Place 1 patch (21 mg total) onto the skin daily. 28 patch 0   nicotine  polacrilex (NICORETTE ) 4 MG gum Take 1 each (4 mg total) by mouth as needed for smoking cessation. 100 tablet 0   Omega-3 Fatty Acids (OMEGA-3 FISH OIL PO) Take 1 capsule by mouth daily.      omeprazole (PRILOSEC) 20 MG capsule Take 20 mg by mouth 2 (two) times daily before a meal.     polyethylene glycol (MIRALAX  / GLYCOLAX ) 17 g packet Take 17 g by mouth daily as needed for mild constipation. 14 each 0   prochlorperazine  (COMPAZINE ) 10 MG tablet Take 1 tablet (10 mg total) by mouth every 6 (six) hours as needed for nausea or vomiting. 30 tablet 1   simvastatin  (ZOCOR ) 20 MG tablet Take 20 mg by mouth daily.     tamsulosin  (FLOMAX ) 0.4 MG CAPS capsule Take 0.4 mg by mouth daily. (Patient not taking: Reported on 09/02/2023)     temazepam  (RESTORIL ) 30 MG  capsule Take 1 capsule (30 mg total) by mouth at bedtime. (Patient taking differently: Take 30 mg by mouth at bedtime.) 30 capsule 0   No current facility-administered medications for this visit.    SURGICAL HISTORY:  Past Surgical History:  Procedure Laterality Date   COLONOSCOPY     INGUINAL HERNIA REPAIR Right 12/25/2020   Procedure: RIGHT OPEN INGUINAL HERNIA REPAIR WITH MESH;  Surgeon: Kinsinger, Herlene Righter, MD;  Location: [facility];  Service: General;  Laterality: Right;  ROOM 3 STARTING AT 09:30AM FOR 1 HOUR   LAPAROTOMY N/A 02/25/2017   Procedure: EXPLORATORY LAPAROTOMY MODIFIED GRAHAM'S PATCH;  Surgeon: Stevie Herlene Righter, MD;  Location: [facility];  Service: General;  Laterality: N/A;   MINOR PLACEMENT OF FIDUCIAL N/A 01/19/2017   Procedure: MINOR PLACEMENT OF FIDUCIAL x 6;  Surgeon: Lamar GORMAN Chris, MD;  Location: [facility];  Service: Thoracic;  Laterality: N/A;   PILONIDAL CYST EXCISION  2019   VIDEO BRONCHOSCOPY WITH ENDOBRONCHIAL NAVIGATION N/A 01/19/2017   Procedure: VIDEO BRONCHOSCOPY WITH ENDOBRONCHIAL NAVIGATION;  Surgeon: Lamar GORMAN Chris, MD;  Location: [facility];  Service: Thoracic;  Laterality: N/A;    REVIEW OF SYSTEMS:   Review of Systems  Constitutional: Negative for appetite change, chills, fatigue, fever and unexpected weight change.  HENT:   Negative for mouth sores, nosebleeds, sore throat and  trouble swallowing.   Eyes: Negative for eye problems and icterus.  Respiratory: Negative for cough, hemoptysis, shortness of breath and wheezing.   Cardiovascular: Negative for chest pain and leg swelling.  Gastrointestinal: Negative for abdominal pain, constipation, diarrhea, nausea and vomiting.  Genitourinary: Negative for bladder incontinence, difficulty urinating, dysuria, frequency and hematuria.   Musculoskeletal: Negative for back pain, gait problem, neck pain and neck stiffness.  Skin: Negative for itching and rash.  Neurological: Negative for dizziness, extremity weakness, gait problem, headaches, light-headedness and seizures.  Hematological: Negative for adenopathy. Does not bruise/bleed easily.  Psychiatric/Behavioral: Negative for confusion, depression and sleep disturbance. The patient is not nervous/anxious.     PHYSICAL EXAMINATION:  There were no vitals taken for this visit.  ECOG PERFORMANCE STATUS: {CHL ONC ECOG [id number]  Physical Exam  Constitutional: Oriented to person, place, and time and well-developed, well-nourished, and in no distress. No distress.  HENT:  Head: Normocephalic and atraumatic.  Mouth/Throat: Oropharynx is clear and moist. No oropharyngeal exudate.  Eyes: Conjunctivae are normal. Right eye exhibits no discharge. Left eye exhibits no discharge. No scleral icterus.  Neck: Normal range of motion. Neck supple.  Cardiovascular: Normal rate, regular rhythm, normal heart sounds and intact distal pulses.   Pulmonary/Chest: Effort normal and breath sounds normal. No respiratory distress. No wheezes. No rales.  Abdominal: Soft. Bowel sounds are normal. Exhibits no distension and no mass. There is no tenderness.  Musculoskeletal: Normal range of motion. Exhibits no edema.  Lymphadenopathy:    No cervical adenopathy.  Neurological: Alert and oriented to person, place, and time. Exhibits normal muscle tone. Gait normal. Coordination normal.  Skin:  Skin is warm and dry. No rash noted. Not diaphoretic. No erythema. No pallor.  Psychiatric: Mood, memory and judgment normal.  Vitals reviewed.  LABORATORY DATA: Lab Results  Component Value Date   WBC 11.1 (H) 09/05/2023   HGB 13.3 09/05/2023   HCT 43.9 09/05/2023   MCV 90.7 09/05/2023   PLT 274 09/05/2023      Chemistry      Component Value Date/Time   NA 137 09/05/2023 0648   NA 139 10/06/2017  1052   K 4.0 09/05/2023 0648   K 4.2 10/06/2017 1052   CL 95 (L) 09/05/2023 0648   CO2 35 (H) 09/05/2023 0648   CO2 25 10/06/2017 1052   BUN 11 09/05/2023 0648   BUN 13.2 10/06/2017 1052   CREATININE 0.53 (L) 09/05/2023 0648   CREATININE 0.81 04/06/2023 1521   CREATININE 0.9 10/06/2017 1052      Component Value Date/Time   CALCIUM 8.8 (L) 09/05/2023 0648   CALCIUM 9.5 10/06/2017 1052   ALKPHOS 61 09/02/2023 1734   ALKPHOS 68 10/06/2017 1052   AST 25 09/02/2023 1734   AST 15 04/06/2023 1521   AST 14 10/06/2017 1052   ALT 23 09/02/2023 1734   ALT 9 04/06/2023 1521   ALT 13 10/06/2017 1052   BILITOT 0.4 09/02/2023 1734   BILITOT 0.4 04/06/2023 1521   BILITOT 0.44 10/06/2017 1052       RADIOGRAPHIC STUDIES:  CT CHEST ABDOMEN PELVIS W CONTRAST Result Date: 09/22/2023 CLINICAL DATA:  Non-small cell lung cancer restaging * Tracking Code: BO * EXAM: CT CHEST, ABDOMEN, AND PELVIS WITH CONTRAST TECHNIQUE: Multidetector CT imaging of the chest, abdomen and pelvis was performed following the standard protocol during bolus administration of intravenous contrast. RADIATION DOSE REDUCTION: This exam was performed according to the departmental dose-optimization program which includes automated exposure control, adjustment of the mA and/or kV according to patient size and/or use of iterative reconstruction technique. CONTRAST:  OMNIPAQUE  IOHEXOL  300 MG/ML  SOLN COMPARISON:  Multiple exams, including 09/02/2023 and 04/06/2023 FINDINGS: CT CHEST FINDINGS Cardiovascular: Coronary, aortic  arch, and branch vessel atherosclerotic vascular disease. Ascending thoracic aortic aneurysm 4.2 cm in diameter on image 34 series 2, stable by my measurements. Mediastinum/Nodes: Right hilar node 1.1 cm in short axis on image 30 series 2, previously 1.2 cm. Lungs/Pleura: Moderate to large left pleural effusion with thick enhancing rind along the pleural margins indicating exudative effusion possibly malignant effusion. Emphysema noted. Coarse reticulonodular interstitial accentuation in the left lung favoring the upper lobe, a component of lymphangitic carcinomatosis is not excluded. Nodularity along the left anterior pleural margin. Continued band of abnormal density along the upper major fissure and extending in the left upper lobe, the component adjacent to the fiducials measures 1.7 cm in thickness on 66 of series 4, formerly the same. There is some increased atelectasis and consolidation in the lingula, tumor involvement or pneumonia in the lingula not excluded. Spiculated nodule in the apical segment right upper lobe 2.4 by 1.1 cm on image 24 series 4, roughly stable. A smaller posterior nodule in the right upper lobe measuring 0.7 cm in long axis on image 31 series 4 previously measured the same by my measurements. Musculoskeletal: Fractures of the left fourth and fifth ribs with surrounding osteoid, similar to previous, cannot exclude pathologic fractures. Interbody fusion at C6-7. Thoracic spondylosis. CT ABDOMEN PELVIS FINDINGS Hepatobiliary: Unremarkable Pancreas: Unremarkable Spleen: Unremarkable Adrenals/Urinary Tract: Stable low-density thickening of both adrenal glands. No focal adrenal mass. Wall thickening in the urinary bladder, cannot exclude cystitis although some of this may be from nondistention. Scarring in the right kidney upper pole. Stomach/Bowel: Sigmoid colon diverticulosis. Descending colon diverticulosis. Vascular/Lymphatic: Atherosclerosis is present, including aortoiliac  atherosclerotic disease. Stable 3.6 cm infrarenal abdominal aortic aneurysm. Reproductive: Small bilateral scrotal hydroceles. Other: No supplemental non-categorized findings. Musculoskeletal: Umbilical and supraumbilical hernias contain adipose tissue. Bridging spurring of both sacroiliac joints. Degenerative arthropathy of both hips. IMPRESSION: 1. Moderate to large left pleural effusion with thick enhancing rind  along the pleural margins indicating exudative effusion possibly malignant effusion. 2. Coarse reticulonodular interstitial accentuation in the left lung favoring the upper lobe, a component of lymphangitic carcinomatosis is not excluded. 3. There is some increased atelectasis and consolidation in the lingula, tumor involvement or pneumonia in the lingula not excluded. 4. Stable band of abnormal density along the upper major fissure and extending in the left upper lobe, the component adjacent to the fiducials measures 1.7 cm in thickness. 5. Stable spiculated nodule in the apical segment right upper lobe measuring 2.4 by 1.1 cm. A smaller posterior nodule in the right upper lobe measuring 0.7 cm in long axis previously measured the same by my measurements. 6. Stable right hilar node measuring 1.1 cm in short axis. 7. Stable fractures of the left fourth and fifth ribs with surrounding osteoid, cannot exclude pathologic fractures. 8. Stable 3.6 cm infrarenal abdominal aortic aneurysm. Recommend follow-up ultrasound every 2 years. 9. Ascending thoracic aortic aneurysm 4.2 cm in diameter. This can probably be followed in the context of the patient's expected oncology imaging. Otherwise, recommend annual imaging followup by CTA or MRA. This recommendation follows 2010 ACCF/AHA/AATS/ACR/ASA/SCA/SCAI/SIR/STS/SVM Guidelines for the Diagnosis and Management of Patients with Thoracic Aortic Disease. Circulation. 2010; 121: Z733-z630. Aortic aneurysm NOS (ICD10-I71.9) 10. Stable low-density thickening of both  adrenal glands. 11. Wall thickening in the urinary bladder, cannot exclude cystitis although some of this may be from nondistention. 12. Umbilical and supraumbilical hernias contain adipose tissue. 13. Small bilateral scrotal hydroceles. Aortic Atherosclerosis (ICD10-I70.0) and Emphysema (ICD10-J43.9). Electronically Signed   By: Ryan Salvage M.D.   On: 09/22/2023 16:36   DG Chest 1 View Result Date: 09/22/2023 CLINICAL DATA:  Lung cancer.  Left thoracentesis. EXAM: CHEST  1 VIEW COMPARISON:  09/05/2023 FINDINGS: Large residual left pleural effusion. Irregularity along the pleural margin at the left lung apex. Atherosclerotic calcification of the aortic arch. The right lung appears clear. Continued obscuration of the cardiac border by residual pleural effusion and airspace opacity. Hazy density in the aerated portion of the left upper lobe. Elevated left hemidiaphragm. IMPRESSION: 1. Large residual left pleural effusion.  No pneumothorax. 2. Irregularity along the pleural margin at the left lung apex. 3. Hazy interstitial accentuation in the aerated portion of the left upper lobe. 4. Elevated left hemidiaphragm. 5. Aortic Atherosclerosis (ICD10-I70.0). Electronically Signed   By: Ryan Salvage M.D.   On: 09/22/2023 16:25   US  THORACENTESIS ASP PLEURAL SPACE W/IMG GUIDE Result Date: 09/22/2023 INDICATION: Patient with history of stage IV squamous cell carcinoma of the lung, recurrent left pleural effusion. Request received for therapeutic left thoracentesis. EXAM: ULTRASOUND GUIDED THERAPEUTIC LEFT THORACENTESIS MEDICATIONS: 8 mL 1% lidocaine  COMPLICATIONS: None immediate. PROCEDURE: An ultrasound guided thoracentesis was thoroughly discussed with the patient and questions answered. The benefits, risks, alternatives and complications were also discussed. The patient understands and wishes to proceed with the procedure. Written consent was obtained. Ultrasound was performed to localize and mark an  adequate pocket of fluid in the left chest. The area was then prepped and draped in the normal sterile fashion. 1% Lidocaine  was used for local anesthesia. Under ultrasound guidance a 6 Fr Safe-T-Centesis catheter was introduced. Thoracentesis was performed. The catheter was removed and a dressing applied. FINDINGS: A total of approximately 1.8 liters of blood-tinged fluid was removed. Due to persistent patient coughing and chest discomfort only the above amount of fluid was removed today. The pleural fluid collection was multiloculated/multi-septated. IMPRESSION: Successful ultrasound guided therapeutic left thoracentesis yielding 1.8 liters  of pleural fluid. Performed by: Franky Kelsie RIGGERS Electronically Signed   By: Ester Sides M.D.   On: 09/22/2023 14:43     ASSESSMENT/PLAN:  This is a very pleasant 73 year old Caucasian male with stage IV non-small cell lung cancer, squamous cell carcinoma.  He presented with multiple bilateral pulmonary nodules.  He was diagnosed in April 2018.   He underwent systemic chemotherapy with carboplatin  and paclitaxel .  He is status post 6 cycles.  He tolerated it well except for the development of persistent peripheral neuropathy.   He was then on observation for 3 months. He showed signs of disease progression.   The patient was then treated with 28 cycles of immunotherapy with nivolumab  480 mg IV every 4 weeks.  This has been on hold since 05/01/2020 due to patient request for a break. 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  He had evidence of disease progression in November 2024 with malignant pleural effusions.   Therefore, Dr. Evonnie resumed his treatment with immunotherapy. He is expected to start nivolumab  480 mg IV every 4 weeks today on 10/25/23.   Labs were reviewed. Recommend he *** with cycle #1 as scheduled.   We will see him back in 4 weeks for evaluation and repeat labs before cycle #2.   Of note, the patient was advised should he  have symptoms of recurrent effusion to please call us  so we can arrange thoracentesis. Based on his exam today ***  The patient was advised to call immediately if he has any concerning symptoms in the interval. The patient voices understanding of current disease status and treatment options and is in agreement with the current care plan. All questions were answered. The patient knows to call the clinic with any problems, questions or concerns. We can certainly see the patient much sooner if necessary       No orders of the defined types were placed in this encounter.    I spent {CHL ONC TIME VISIT - DTPQU:[id number]} counseling the patient face to face. The total time spent in the appointment was {CHL ONC TIME VISIT - DTPQU:[id number]}.  Tamiah Dysart L Antrice Pal, PA-C 10/22/23

## 2023-10-25 ENCOUNTER — Inpatient Hospital Stay: Payer: Medicare HMO | Attending: Internal Medicine

## 2023-10-25 ENCOUNTER — Inpatient Hospital Stay: Payer: Medicare HMO | Admitting: Physician Assistant

## 2023-10-25 ENCOUNTER — Inpatient Hospital Stay: Payer: Medicare HMO

## 2023-10-26 ENCOUNTER — Encounter: Payer: Self-pay | Admitting: Internal Medicine

## 2023-11-22 ENCOUNTER — Other Ambulatory Visit: Payer: Medicare HMO

## 2023-11-22 ENCOUNTER — Ambulatory Visit: Payer: Medicare HMO

## 2023-11-22 ENCOUNTER — Ambulatory Visit: Payer: Medicare HMO | Admitting: Physician Assistant

## 2023-12-20 ENCOUNTER — Ambulatory Visit: Payer: Medicare HMO | Admitting: Internal Medicine

## 2023-12-20 ENCOUNTER — Other Ambulatory Visit: Payer: Medicare HMO

## 2023-12-20 ENCOUNTER — Ambulatory Visit: Payer: Medicare HMO

## 2024-01-17 ENCOUNTER — Ambulatory Visit: Payer: Medicare HMO

## 2024-01-17 ENCOUNTER — Other Ambulatory Visit: Payer: Medicare HMO

## 2024-01-17 ENCOUNTER — Ambulatory Visit: Payer: Medicare HMO | Admitting: Internal Medicine
